# Patient Record
Sex: Male | Born: 1970
Health system: Southern US, Community
[De-identification: ages and names within clinical notes are randomized; demographics above are authoritative.]

## PROBLEM LIST (undated history)

## (undated) DIAGNOSIS — F32A Depression, unspecified: Secondary | ICD-10-CM

## (undated) DIAGNOSIS — I5032 Chronic diastolic (congestive) heart failure: Secondary | ICD-10-CM

## (undated) DIAGNOSIS — Q761 Klippel-Feil syndrome: Secondary | ICD-10-CM

## (undated) DIAGNOSIS — F419 Anxiety disorder, unspecified: Secondary | ICD-10-CM

## (undated) DIAGNOSIS — F329 Major depressive disorder, single episode, unspecified: Secondary | ICD-10-CM

## (undated) DIAGNOSIS — I509 Heart failure, unspecified: Secondary | ICD-10-CM

## (undated) DIAGNOSIS — E785 Hyperlipidemia, unspecified: Secondary | ICD-10-CM

## (undated) DIAGNOSIS — I10 Essential (primary) hypertension: Secondary | ICD-10-CM

## (undated) DIAGNOSIS — J45909 Unspecified asthma, uncomplicated: Secondary | ICD-10-CM

## (undated) DIAGNOSIS — M1990 Unspecified osteoarthritis, unspecified site: Secondary | ICD-10-CM

## (undated) DIAGNOSIS — J449 Chronic obstructive pulmonary disease, unspecified: Secondary | ICD-10-CM

## (undated) DIAGNOSIS — Z9289 Personal history of other medical treatment: Secondary | ICD-10-CM

## (undated) DIAGNOSIS — H409 Unspecified glaucoma: Secondary | ICD-10-CM

## (undated) DIAGNOSIS — K219 Gastro-esophageal reflux disease without esophagitis: Secondary | ICD-10-CM

## (undated) HISTORY — DX: Chronic diastolic (congestive) heart failure: I50.32

## (undated) HISTORY — PX: DENVER SHUNT PLACEMENT: SUR1031

## (undated) HISTORY — DX: Gastro-esophageal reflux disease without esophagitis: K21.9

## (undated) HISTORY — DX: Anxiety disorder, unspecified: F41.9

## (undated) HISTORY — DX: Chronic obstructive pulmonary disease, unspecified: J44.9

## (undated) HISTORY — DX: Klippel-Feil syndrome: Q76.1

## (undated) HISTORY — DX: Personal history of other medical treatment: Z92.89

## (undated) HISTORY — DX: Unspecified asthma, uncomplicated: J45.909

## (undated) HISTORY — PX: COLOSTOMY: SHX63

## (undated) HISTORY — DX: Hyperlipidemia, unspecified: E78.5

## (undated) HISTORY — DX: Essential (primary) hypertension: I10

## (undated) HISTORY — DX: Depression, unspecified: F32.A

## (undated) HISTORY — DX: Unspecified glaucoma: H40.9

## (undated) HISTORY — DX: Major depressive disorder, single episode, unspecified: F32.9

## (undated) HISTORY — DX: Unspecified osteoarthritis, unspecified site: M19.90

## (undated) HISTORY — DX: Heart failure, unspecified: I50.9

---

## 2004-07-07 ENCOUNTER — Inpatient Hospital Stay: Payer: Self-pay

## 2006-09-06 ENCOUNTER — Ambulatory Visit: Payer: Self-pay | Admitting: Internal Medicine

## 2007-06-13 DIAGNOSIS — I10 Essential (primary) hypertension: Secondary | ICD-10-CM | POA: Insufficient documentation

## 2008-03-13 ENCOUNTER — Ambulatory Visit: Payer: Self-pay | Admitting: Family Medicine

## 2008-06-11 DIAGNOSIS — K529 Noninfective gastroenteritis and colitis, unspecified: Secondary | ICD-10-CM | POA: Insufficient documentation

## 2008-06-11 HISTORY — DX: Noninfective gastroenteritis and colitis, unspecified: K52.9

## 2009-11-25 ENCOUNTER — Ambulatory Visit: Payer: Self-pay

## 2010-04-17 ENCOUNTER — Ambulatory Visit: Payer: Self-pay

## 2011-03-03 ENCOUNTER — Ambulatory Visit: Payer: Self-pay | Admitting: Family Medicine

## 2011-05-25 ENCOUNTER — Ambulatory Visit: Payer: Self-pay | Admitting: Family Medicine

## 2013-06-21 ENCOUNTER — Ambulatory Visit: Payer: Self-pay | Admitting: Surgery

## 2013-08-20 ENCOUNTER — Ambulatory Visit: Payer: Self-pay | Admitting: Family Medicine

## 2014-09-05 ENCOUNTER — Telehealth: Payer: Self-pay | Admitting: Family Medicine

## 2014-09-05 NOTE — Telephone Encounter (Signed)
Patient called and requested to be referred to [facility]. Cb# [phone]

## 2014-09-06 NOTE — Telephone Encounter (Signed)
Need reason for referral and may need appointment for documentation of issue.

## 2014-09-06 NOTE — Telephone Encounter (Signed)
Called and left message for patient to contact us.  He will need to schedule appt

## 2014-09-11 DIAGNOSIS — L255 Unspecified contact dermatitis due to plants, except food: Secondary | ICD-10-CM

## 2014-09-11 DIAGNOSIS — R6882 Decreased libido: Secondary | ICD-10-CM | POA: Insufficient documentation

## 2014-09-11 DIAGNOSIS — S76219A Strain of adductor muscle, fascia and tendon of unspecified thigh, initial encounter: Secondary | ICD-10-CM | POA: Insufficient documentation

## 2014-09-11 DIAGNOSIS — K439 Ventral hernia without obstruction or gangrene: Secondary | ICD-10-CM | POA: Insufficient documentation

## 2014-09-11 DIAGNOSIS — H6990 Unspecified Eustachian tube disorder, unspecified ear: Secondary | ICD-10-CM | POA: Insufficient documentation

## 2014-09-11 DIAGNOSIS — K5900 Constipation, unspecified: Secondary | ICD-10-CM | POA: Insufficient documentation

## 2014-09-11 DIAGNOSIS — F419 Anxiety disorder, unspecified: Secondary | ICD-10-CM | POA: Insufficient documentation

## 2014-09-11 DIAGNOSIS — L649 Androgenic alopecia, unspecified: Secondary | ICD-10-CM | POA: Insufficient documentation

## 2014-09-11 DIAGNOSIS — G43909 Migraine, unspecified, not intractable, without status migrainosus: Secondary | ICD-10-CM | POA: Insufficient documentation

## 2014-09-11 DIAGNOSIS — I10 Essential (primary) hypertension: Secondary | ICD-10-CM | POA: Insufficient documentation

## 2014-09-11 DIAGNOSIS — D489 Neoplasm of uncertain behavior, unspecified: Secondary | ICD-10-CM | POA: Insufficient documentation

## 2014-09-11 DIAGNOSIS — J45909 Unspecified asthma, uncomplicated: Secondary | ICD-10-CM | POA: Insufficient documentation

## 2014-09-11 DIAGNOSIS — R51 Headache: Secondary | ICD-10-CM

## 2014-09-11 DIAGNOSIS — R069 Unspecified abnormalities of breathing: Secondary | ICD-10-CM | POA: Insufficient documentation

## 2014-09-11 DIAGNOSIS — R519 Headache, unspecified: Secondary | ICD-10-CM | POA: Insufficient documentation

## 2014-09-11 DIAGNOSIS — H6980 Other specified disorders of Eustachian tube, unspecified ear: Secondary | ICD-10-CM | POA: Insufficient documentation

## 2014-09-11 DIAGNOSIS — K429 Umbilical hernia without obstruction or gangrene: Secondary | ICD-10-CM | POA: Insufficient documentation

## 2014-09-11 HISTORY — DX: Strain of adductor muscle, fascia and tendon of unspecified thigh, initial encounter: S76.219A

## 2014-09-11 HISTORY — DX: Unspecified eustachian tube disorder, unspecified ear: H69.90

## 2014-09-11 HISTORY — DX: Unspecified contact dermatitis due to plants, except food: L25.5

## 2014-09-13 ENCOUNTER — Other Ambulatory Visit: Payer: Self-pay

## 2014-09-13 ENCOUNTER — Encounter: Payer: Self-pay | Admitting: Family Medicine

## 2014-09-13 ENCOUNTER — Ambulatory Visit (INDEPENDENT_AMBULATORY_CARE_PROVIDER_SITE_OTHER): Payer: Medicare Other | Admitting: Family Medicine

## 2014-09-13 ENCOUNTER — Ambulatory Visit
Admission: RE | Admit: 2014-09-13 | Discharge: 2014-09-13 | Disposition: A | Discharge status: Home / Self Care | Payer: Medicare Other | Source: Ambulatory Visit | Attending: Family Medicine | Admitting: Family Medicine

## 2014-09-13 VITALS — BP 122/78 | HR 80 | Temp 99.0°F | Resp 20 | Ht 62.0 in | Wt 171.0 lb

## 2014-09-13 DIAGNOSIS — Q761 Klippel-Feil syndrome: Secondary | ICD-10-CM | POA: Diagnosis not present

## 2014-09-13 DIAGNOSIS — I10 Essential (primary) hypertension: Secondary | ICD-10-CM | POA: Insufficient documentation

## 2014-09-13 DIAGNOSIS — M25541 Pain in joints of right hand: Secondary | ICD-10-CM

## 2014-09-13 DIAGNOSIS — M79641 Pain in right hand: Secondary | ICD-10-CM

## 2014-09-13 DIAGNOSIS — Z87891 Personal history of nicotine dependence: Secondary | ICD-10-CM | POA: Insufficient documentation

## 2014-09-13 MED ORDER — ETODOLAC 200 MG PO CAPS: 200.0000 mg | ORAL_CAPSULE | Freq: Three times a day (TID) | ORAL | Status: DC

## 2014-09-13 NOTE — Progress Notes (Signed)
Subjective:     Patient ID: Scott [name], male   DOB: 13-Feb-1971, 44 y.o.   MRN: [id number]  Hand Pain  The incident occurred more than 1 week ago. There was no injury mechanism. The pain is present in the right hand and right fingers. The quality of the pain is described as cramping. The pain does not radiate. The pain is at a severity of 7/10. The pain is moderate. The pain has been intermittent since the incident. Associated symptoms include numbness and tingling. The symptoms are aggravated by movement and lifting (writing ). He has tried nothing for the symptoms.  Patient reports that he thinks he may have injured hand while opening a jar a few days ago. He can not think of anything else that could have caused pain.    Review of Systems  Constitutional: Negative.   Musculoskeletal: Positive for arthralgias and neck stiffness. Negative for joint swelling.  Neurological: Positive for tingling and numbness.   Patient Active Problem List   Diagnosis Date Noted  . Anxiety 09/11/2014  . Airway hyperreactivity 09/11/2014  . CN (constipation) 09/11/2014  . Alopecia, male pattern 09/11/2014  . Decreased libido 09/11/2014  . Abnormal respiratory rate 09/11/2014  . Dysfunction of eustachian tube 09/11/2014  . Groin strain 09/11/2014  . Cephalalgia 09/11/2014  . Umbilical hernia 01/30/2535  . Essential hypertension 09/11/2014  . Migraine 09/11/2014  . Neoplasm of uncertain behavior 64/40/3474  . Hernia of anterior abdominal wall 09/11/2014  . Contact dermatitis due to Genus Toxicodendron 09/11/2014  . Open-angle glaucoma 05/20/2009  . Congenital hydrocephalus 05/20/2009  . 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 Enteritis presumed infectious 06/11/2008  . HLD (hyperlipidemia) 03/15/2008  . Clinical depression 06/13/2007  . Essential (primary) hypertension 06/13/2007   Past Surgical History  Procedure Laterality Date  . Colostomy    . [place] shunt placement     Family History  Problem  Relation Age of Onset  . Multiple sclerosis Mother   . Colon cancer Father   . Heart disease Father   . Lung cancer Maternal Grandmother   . Thyroid cancer Maternal Grandfather   . Ovarian cancer Paternal Grandmother   . Cancer Paternal Grandfather    History  Substance Use Topics  . Smoking status: Former Smoker -- 10 years  . Smokeless tobacco: Not on file     Comment: quit 05/2006  . Alcohol Use: No   Current Outpatient Prescriptions on File Prior to Visit  Medication Sig Dispense Refill  . brinzolamide (AZOPT) 1 % ophthalmic suspension Apply 1 drop to eye daily.    . diazepam (VALIUM) 2 MG tablet Take 1 tablet by mouth as needed.    . latanoprost (XALATAN) 0.005 % ophthalmic solution 1 drop at bedtime.    Marland Kitchen LORazepam (ATIVAN) 1 MG tablet Take 1 tablet by mouth 2 (two) times daily.    Marland Kitchen PARoxetine (PAXIL) 40 MG tablet Take 1 tablet by mouth daily.    . quinapril (ACCUPRIL) 20 MG tablet Take 1 tablet by mouth daily.     No current facility-administered medications on file prior to visit.   Allergies  Allergen Reactions  . Codeine         Objective:   Physical Exam  Constitutional: He is oriented to person, place, and time. He appears well-developed and well-nourished. No distress.  HENT:  Head: Atraumatic.  Right Ear: Hearing normal.  Left Ear: Hearing normal.  Nose: Nose normal.  Eyes: Conjunctivae and lids are normal. Right eye exhibits no  discharge. Left eye exhibits no discharge. No scleral icterus.  Pulmonary/Chest: Effort normal. No respiratory distress.  Musculoskeletal:  Left first metacarpal bone missing congenitally. Tender right index finger PIP joint and MCP joint. Also, tender right 5th finger PIP joint. Scars at base of right thumb and palm at the base of the 5th finger from surgery to regain mobility and strength in the right hand. Limited ability to grip and completely oppose right thumb. Left hand stiff and unable to grip or flex/extend wrist due to  congenital anomalies.  Neurological: He is alert and oriented to person, place, and time.  Skin: Skin is intact. No lesion and no rash noted.  Psychiatric: He has a normal mood and affect. His speech is normal and behavior is normal. Thought content normal.   BP 122/78 mmHg  Pulse 80  Temp(Src) 99 F (37.2 C)  Resp 20  Ht 5\' 2"  (1.575 m)  Wt 171 lb (77.565 kg)  BMI 31.27 kg/m2     Assessment:    Joint pain in fingers of right hand - Recent flare without specific injury. Suspect degenerative arthritis due to congenital deformities and limited functionality.        Plan:    Will get complete x-ray of right hand and treat with Etodolac for pain and inflammation. May use hot soaks or heating pad. May need referral to hand surgeon/orthopedist pending reports of x-rays.

## 2014-09-16 ENCOUNTER — Telehealth: Payer: Self-pay

## 2014-09-16 NOTE — Telephone Encounter (Signed)
-----   Message from Margo Common, [place] sent at 09/13/2014  4:54 PM EDT ----- No fracture of bones. Only mild arthritis changes. If no relief from medication and heat applications in 5-6 days, will schedule orthopedic referral.

## 2014-09-16 NOTE — Telephone Encounter (Signed)
LMTCB

## 2014-09-16 NOTE — Telephone Encounter (Signed)
Patient advised as directed below. Patient verbalized understanding.  

## 2014-11-01 ENCOUNTER — Telehealth: Payer: Self-pay | Admitting: Family Medicine

## 2014-11-01 DIAGNOSIS — M79641 Pain in right hand: Secondary | ICD-10-CM

## 2014-11-01 NOTE — Telephone Encounter (Signed)
Pt stated that his hand isn't doing better and the pain has started going up his forearm into his elbow. PT would like to get an orthopedic referral. Thanks TNP

## 2014-11-05 NOTE — Telephone Encounter (Signed)
Schedule orthopedic hand referral for persistent right hand pain over the past 6-8 weeks. History of Klippel-Feil syndrome with congenital hydrocephalus and multiple skeletal deformities. X-ray on 09-13-14 showed absent scaphoid and lunate bones on the right.

## 2014-11-07 ENCOUNTER — Ambulatory Visit
Admission: RE | Admit: 2014-11-07 | Discharge: 2014-11-07 | Disposition: A | Discharge status: Home / Self Care | Payer: Medicare Other | Source: Ambulatory Visit | Attending: Family Medicine | Admitting: Family Medicine

## 2014-11-07 ENCOUNTER — Encounter: Payer: Self-pay | Admitting: Family Medicine

## 2014-11-07 ENCOUNTER — Ambulatory Visit (INDEPENDENT_AMBULATORY_CARE_PROVIDER_SITE_OTHER): Payer: Medicare Other | Admitting: Family Medicine

## 2014-11-07 DIAGNOSIS — Q039 Congenital hydrocephalus, unspecified: Secondary | ICD-10-CM | POA: Diagnosis not present

## 2014-11-07 DIAGNOSIS — T07XXXA Unspecified multiple injuries, initial encounter: Secondary | ICD-10-CM

## 2014-11-07 DIAGNOSIS — Q761 Klippel-Feil syndrome: Secondary | ICD-10-CM

## 2014-11-07 DIAGNOSIS — M5136 Other intervertebral disc degeneration, lumbar region: Secondary | ICD-10-CM | POA: Diagnosis not present

## 2014-11-07 DIAGNOSIS — G44209 Tension-type headache, unspecified, not intractable: Secondary | ICD-10-CM

## 2014-11-07 DIAGNOSIS — R0781 Pleurodynia: Secondary | ICD-10-CM | POA: Diagnosis not present

## 2014-11-07 DIAGNOSIS — M545 Low back pain: Secondary | ICD-10-CM | POA: Diagnosis present

## 2014-11-07 DIAGNOSIS — M419 Scoliosis, unspecified: Secondary | ICD-10-CM | POA: Insufficient documentation

## 2014-11-07 DIAGNOSIS — T148 Other injury of unspecified body region: Secondary | ICD-10-CM
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
## 2014-11-11 ENCOUNTER — Telehealth: Payer: Self-pay

## 2014-11-11 NOTE — Telephone Encounter (Signed)
-----   Message from Margo Common, [place] sent at 11/08/2014  3:26 PM EDT ----- No sign of rib fractures. All x-rays essentially unchanged without acute bony injury.

## 2014-11-11 NOTE — Telephone Encounter (Signed)
Patient advised as directed below.  Thanks,  -Karl Knarr 

## 2014-11-11 NOTE — Telephone Encounter (Signed)
LMTCB

## 2014-11-11 NOTE — Telephone Encounter (Signed)
LMTCB  11/11/14  Thanks,  -Joseline

## 2014-11-11 NOTE — Telephone Encounter (Signed)
Pt returned call. Thanks TNP °

## 2014-11-11 NOTE — Telephone Encounter (Signed)
-----   Message from Margo Common, [place] sent at 11/08/2014  3:23 PM EDT ----- X-ray reports some degenerative facet changes in the lower lumbar region with rods still stable and in place. No sign of acute injury or fractures.

## 2014-11-11 NOTE — Telephone Encounter (Signed)
-----   Message from [place], [place] sent at 11/08/2014  3:25 PM EDT ----- Stable scoliosis on chest x-ray without infection. Stable cardiac silhouette without heart enlargement. No pleural effusion.

## 2014-11-15 ENCOUNTER — Ambulatory Visit
Admission: RE | Admit: 2014-11-15 | Discharge: 2014-11-15 | Disposition: A | Discharge status: Home / Self Care | Payer: Medicare Other | Source: Ambulatory Visit | Attending: Family Medicine | Admitting: Family Medicine

## 2014-11-15 DIAGNOSIS — Q039 Congenital hydrocephalus, unspecified: Secondary | ICD-10-CM | POA: Insufficient documentation

## 2014-11-15 DIAGNOSIS — G44209 Tension-type headache, unspecified, not intractable: Secondary | ICD-10-CM | POA: Insufficient documentation

## 2014-11-19 ENCOUNTER — Telehealth: Payer: Self-pay

## 2014-11-19 ENCOUNTER — Telehealth: Payer: Self-pay | Admitting: Family Medicine

## 2014-11-19 NOTE — Telephone Encounter (Signed)
LMTCB -11/19/14  Thanks,   -Nicci Vaughan

## 2014-11-19 NOTE — Telephone Encounter (Signed)
Advised patient of CT results.  

## 2014-11-19 NOTE — Telephone Encounter (Signed)
Pt is returning call.  CB#[phone]/MW
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
## 2014-11-25 ENCOUNTER — Other Ambulatory Visit: Payer: Self-pay | Admitting: Orthopedic Surgery

## 2014-11-25 DIAGNOSIS — M25511 Pain in right shoulder: Secondary | ICD-10-CM

## 2014-11-29 ENCOUNTER — Ambulatory Visit
Admission: RE | Admit: 2014-11-29 | Discharge: 2014-11-29 | Disposition: A | Discharge status: Home / Self Care | Payer: Medicare Other | Source: Ambulatory Visit | Attending: Orthopedic Surgery | Admitting: Orthopedic Surgery

## 2014-11-29 DIAGNOSIS — M25511 Pain in right shoulder: Secondary | ICD-10-CM

## 2014-11-29 DIAGNOSIS — M7551 Bursitis of right shoulder: Secondary | ICD-10-CM | POA: Diagnosis not present

## 2014-11-29 DIAGNOSIS — S46111A Strain of muscle, fascia and tendon of long head of biceps, right arm, initial encounter: Secondary | ICD-10-CM | POA: Diagnosis not present

## 2014-11-29 DIAGNOSIS — X58XXXA Exposure to other specified factors, initial encounter: Secondary | ICD-10-CM | POA: Diagnosis not present

## 2014-11-29 MED ORDER — IOHEXOL 300 MG/ML  SOLN: 10.0000 mL | Freq: Once | INTRAMUSCULAR | Status: DC | PRN

## 2014-12-07 ENCOUNTER — Other Ambulatory Visit: Payer: Self-pay | Admitting: Family Medicine

## 2015-01-10 ENCOUNTER — Encounter: Payer: Self-pay | Admitting: Family Medicine

## 2015-06-16 DIAGNOSIS — Z79899 Other long term (current) drug therapy: Secondary | ICD-10-CM | POA: Diagnosis not present

## 2015-06-16 DIAGNOSIS — F330 Major depressive disorder, recurrent, mild: Secondary | ICD-10-CM | POA: Diagnosis not present

## 2015-06-19 DIAGNOSIS — D352 Benign neoplasm of pituitary gland: Secondary | ICD-10-CM | POA: Diagnosis not present

## 2015-06-19 DIAGNOSIS — E221 Hyperprolactinemia: Secondary | ICD-10-CM | POA: Diagnosis not present

## 2015-06-19 DIAGNOSIS — E291 Testicular hypofunction: Secondary | ICD-10-CM | POA: Diagnosis not present

## 2015-06-25 DIAGNOSIS — Q761 Klippel-Feil syndrome: Secondary | ICD-10-CM | POA: Diagnosis not present

## 2015-06-25 DIAGNOSIS — Q540 Hypospadias, balanic: Secondary | ICD-10-CM | POA: Diagnosis not present

## 2015-06-25 DIAGNOSIS — E785 Hyperlipidemia, unspecified: Secondary | ICD-10-CM | POA: Diagnosis not present

## 2015-06-25 DIAGNOSIS — D352 Benign neoplasm of pituitary gland: Secondary | ICD-10-CM | POA: Diagnosis not present

## 2015-06-25 DIAGNOSIS — E291 Testicular hypofunction: Secondary | ICD-10-CM | POA: Diagnosis not present

## 2015-06-25 DIAGNOSIS — F5101 Primary insomnia: Secondary | ICD-10-CM | POA: Diagnosis not present

## 2015-06-25 DIAGNOSIS — F4322 Adjustment disorder with anxiety: Secondary | ICD-10-CM | POA: Diagnosis not present

## 2015-06-25 DIAGNOSIS — I10 Essential (primary) hypertension: Secondary | ICD-10-CM | POA: Diagnosis not present

## 2015-06-25 DIAGNOSIS — E221 Hyperprolactinemia: Secondary | ICD-10-CM | POA: Diagnosis not present

## 2015-09-18 DIAGNOSIS — E291 Testicular hypofunction: Secondary | ICD-10-CM | POA: Diagnosis not present

## 2015-09-18 DIAGNOSIS — D352 Benign neoplasm of pituitary gland: Secondary | ICD-10-CM | POA: Diagnosis not present

## 2015-09-18 DIAGNOSIS — F4322 Adjustment disorder with anxiety: Secondary | ICD-10-CM | POA: Diagnosis not present

## 2015-09-18 DIAGNOSIS — E785 Hyperlipidemia, unspecified: Secondary | ICD-10-CM | POA: Diagnosis not present

## 2015-09-18 DIAGNOSIS — Q540 Hypospadias, balanic: Secondary | ICD-10-CM | POA: Diagnosis not present

## 2015-09-18 DIAGNOSIS — E221 Hyperprolactinemia: Secondary | ICD-10-CM | POA: Diagnosis not present

## 2015-09-18 DIAGNOSIS — Q761 Klippel-Feil syndrome: Secondary | ICD-10-CM | POA: Diagnosis not present

## 2015-09-25 DIAGNOSIS — E221 Hyperprolactinemia: Secondary | ICD-10-CM | POA: Diagnosis not present

## 2015-09-25 DIAGNOSIS — F4322 Adjustment disorder with anxiety: Secondary | ICD-10-CM | POA: Diagnosis not present

## 2015-09-25 DIAGNOSIS — E785 Hyperlipidemia, unspecified: Secondary | ICD-10-CM | POA: Diagnosis not present

## 2015-09-25 DIAGNOSIS — Q540 Hypospadias, balanic: Secondary | ICD-10-CM | POA: Diagnosis not present

## 2015-09-25 DIAGNOSIS — I10 Essential (primary) hypertension: Secondary | ICD-10-CM | POA: Diagnosis not present

## 2015-09-25 DIAGNOSIS — D352 Benign neoplasm of pituitary gland: Secondary | ICD-10-CM | POA: Diagnosis not present

## 2015-09-25 DIAGNOSIS — Q761 Klippel-Feil syndrome: Secondary | ICD-10-CM | POA: Diagnosis not present

## 2015-09-25 DIAGNOSIS — E291 Testicular hypofunction: Secondary | ICD-10-CM | POA: Diagnosis not present

## 2015-09-25 DIAGNOSIS — F5101 Primary insomnia: Secondary | ICD-10-CM | POA: Diagnosis not present

## 2015-10-15 DIAGNOSIS — Z86011 Personal history of benign neoplasm of the brain: Secondary | ICD-10-CM | POA: Diagnosis not present

## 2015-10-15 DIAGNOSIS — D352 Benign neoplasm of pituitary gland: Secondary | ICD-10-CM | POA: Diagnosis not present

## 2015-10-15 DIAGNOSIS — Z09 Encounter for follow-up examination after completed treatment for conditions other than malignant neoplasm: Secondary | ICD-10-CM | POA: Diagnosis not present

## 2015-10-20 DIAGNOSIS — D352 Benign neoplasm of pituitary gland: Secondary | ICD-10-CM | POA: Diagnosis not present

## 2015-10-20 DIAGNOSIS — F4322 Adjustment disorder with anxiety: Secondary | ICD-10-CM | POA: Diagnosis not present

## 2015-10-20 DIAGNOSIS — Q761 Klippel-Feil syndrome: Secondary | ICD-10-CM | POA: Diagnosis not present

## 2015-10-20 DIAGNOSIS — I10 Essential (primary) hypertension: Secondary | ICD-10-CM | POA: Diagnosis not present

## 2015-10-20 DIAGNOSIS — E291 Testicular hypofunction: Secondary | ICD-10-CM | POA: Diagnosis not present

## 2015-10-20 DIAGNOSIS — F5101 Primary insomnia: Secondary | ICD-10-CM | POA: Diagnosis not present

## 2015-10-20 DIAGNOSIS — E221 Hyperprolactinemia: Secondary | ICD-10-CM | POA: Diagnosis not present

## 2015-10-20 DIAGNOSIS — Q540 Hypospadias, balanic: Secondary | ICD-10-CM | POA: Diagnosis not present

## 2015-10-20 DIAGNOSIS — E785 Hyperlipidemia, unspecified: Secondary | ICD-10-CM | POA: Diagnosis not present

## 2015-12-05 ENCOUNTER — Other Ambulatory Visit: Payer: Self-pay | Admitting: Family Medicine

## 2015-12-10 ENCOUNTER — Ambulatory Visit: Payer: Medicare Other | Admitting: Family Medicine

## 2015-12-11 ENCOUNTER — Ambulatory Visit (INDEPENDENT_AMBULATORY_CARE_PROVIDER_SITE_OTHER): Payer: Medicare Other | Admitting: Family Medicine

## 2015-12-11 ENCOUNTER — Encounter: Payer: Self-pay | Admitting: Family Medicine

## 2015-12-11 VITALS — BP 142/82 | HR 81 | Temp 98.2°F | Resp 14 | Wt 184.6 lb

## 2015-12-11 DIAGNOSIS — R1032 Left lower quadrant pain: Secondary | ICD-10-CM | POA: Diagnosis not present

## 2015-12-11 DIAGNOSIS — N451 Epididymitis: Secondary | ICD-10-CM | POA: Diagnosis not present

## 2015-12-11 DIAGNOSIS — N475 Adhesions of prepuce and glans penis: Secondary | ICD-10-CM

## 2015-12-11 DIAGNOSIS — N478 Other disorders of prepuce: Secondary | ICD-10-CM

## 2015-12-11 LAB — POCT URINALYSIS DIPSTICK
Bilirubin, UA: NEGATIVE
GLUCOSE UA: NEGATIVE
Ketones, UA: NEGATIVE
Leukocytes, UA: NEGATIVE
NITRITE UA: NEGATIVE
PH UA: 6
PROTEIN UA: NEGATIVE
RBC UA: NEGATIVE
Spec Grav, UA: 1.02
UROBILINOGEN UA: 0.2

## 2015-12-11 MED ORDER — DOXYCYCLINE HYCLATE 100 MG PO TABS: 100.0000 mg | ORAL_TABLET | Freq: Two times a day (BID) | ORAL | 0 refills | Status: DC

## 2015-12-11 NOTE — Progress Notes (Signed)
Patient: Scott [name] Male    DOB: [birth year]   45 y.o.   MRN: [id number] Visit Date: 12/11/2015  Today's Provider: [name], PA   Chief Complaint  Patient presents with  . Abdominal Pain   Subjective:    Abdominal Pain  This is a new problem. The current episode started more than 1 month ago. The problem occurs intermittently. The problem has been gradually worsening. The pain is located in the LLQ. The quality of the pain is aching. Pain radiation: back and leg. Exacerbated by: sitting. Relieved by: masterbating. abdominal hernia   Patient Active Problem List   Diagnosis Date Noted  . Anxiety 09/11/2014  . Airway hyperreactivity 09/11/2014  . CN (constipation) 09/11/2014  . Alopecia, male pattern 09/11/2014  . Decreased libido 09/11/2014  . Abnormal respiratory rate 09/11/2014  . Dysfunction of eustachian tube 09/11/2014  . Groin strain 09/11/2014  . Cephalalgia 09/11/2014  . Umbilical hernia [id number]  . Essential hypertension 09/11/2014  . Migraine 09/11/2014  . Neoplasm of uncertain behavior [id number]  . Hernia of anterior abdominal wall 09/11/2014  . Contact dermatitis due to Genus Toxicodendron 09/11/2014  . Open-angle glaucoma 05/20/2009  . Congenital hydrocephalus ([place]) 05/20/2009  . 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 Enteritis presumed infectious 06/11/2008  . HLD (hyperlipidemia) 03/15/2008  . Clinical depression 06/13/2007  . Essential (primary) hypertension 06/13/2007   Past Surgical History:  Procedure Laterality Date  . COLOSTOMY    . [place] SHUNT PLACEMENT     Family History  Problem Relation Age of Onset  . Multiple sclerosis Mother   . Colon cancer Father   . Heart disease Father   . Lung cancer Maternal Grandmother   . Thyroid cancer Maternal Grandfather   . Ovarian cancer Paternal Grandmother   . Cancer Paternal Grandfather    Allergies  Allergen Reactions  . Codeine      Previous Medications   ANDROGEL PUMP 20.25 MG/ACT (1.62%)  GEL       BRINZOLAMIDE (AZOPT) 1 % OPHTHALMIC SUSPENSION    Apply 1 drop to eye daily.   BROMOCRIPTINE (PARLODEL) 2.5 MG TABLET    TK 3 TS PO QAM   DIAZEPAM (VALIUM) 2 MG TABLET    Take 1 tablet by mouth as needed.   LATANOPROST (XALATAN) 0.005 % OPHTHALMIC SOLUTION    1 drop at bedtime.   LORAZEPAM (ATIVAN) 1 MG TABLET    Take 1 tablet by mouth 2 (two) times daily.   PAROXETINE (PAXIL) 40 MG TABLET    Take 1 tablet by mouth daily.   QUINAPRIL (ACCUPRIL) 20 MG TABLET    TAKE 1 TABLET BY MOUTH EVERY DAY    Review of Systems  Constitutional: Negative.   Respiratory: Negative.   Cardiovascular: Negative.   Gastrointestinal: Positive for abdominal pain.    Social History  Substance Use Topics  . Smoking status: Former Smoker    Years: 10.00  . Smokeless tobacco: Never Used     Comment: quit 05/2006  . Alcohol use No   Objective:   BP (!) 142/82 (BP Location: Right Arm, Patient Position: Sitting, Cuff Size: Normal)   Pulse 81   Temp 98.2 F (36.8 C) (Oral)   Resp 14   Wt 184 lb 9.6 oz (83.7 kg)   SpO2 95%   BMI 33.76 kg/m  Wt Readings from Last 3 Encounters:  12/11/15 184 lb 9.6 oz (83.7 kg)  11/29/14 171 lb (77.6 kg)  11/07/14 171 lb 3.2 oz (77.7 kg)  Physical Exam  Constitutional: He is oriented to person, place, and time. He appears well-developed and well-nourished.  HENT:  Deformity of skull and left side of jaw - mild.   Eyes: Conjunctivae are normal.  Cardiovascular: Normal rate.   Pulmonary/Chest: Breath sounds normal.  Abdominal: Soft. Bowel sounds are normal. He exhibits no mass. There is no tenderness. There is no guarding.  Genitourinary:  Genitourinary Comments: Some tenderness of left scrotum/posterior testicle of uncircumcised male. No sign of hernia. No inguinal tenderness.  Neurological: He is alert and oriented to person, place, and time.      Assessment & Plan:     1. Left lower quadrant pain Intermittent discomfort for the past few months. No  hematuria, hematemesis, diarrhea, constipation or hematochezia. Seems to be worse with sitting long periods of time and radiate to the left testicle. Relieved by masturbating. Urinalysis normal except concentrated urine and need for increase in water intake. Suspect epididymitis. - POCT Urinalysis Dipstick  2. Epididymitis, left Slight tenderness to palpate. Will treat with Doxycycline and may soak in a hot bath. Recheck if no better in 10-14 days. - doxycycline (VIBRA-TABS) 100 MG tablet; Take 1 tablet (100 mg total) by mouth 2 (two) times daily.  Dispense: 20 tablet; Refill: 0  3. Foreskin adhesions States he has some difficulty attaining erections with discomfort and unable to accomplish vaginal intercourse. Recommend urology referral. - Ambulatory referral to Urology

## 2015-12-24 DIAGNOSIS — H40153 Residual stage of open-angle glaucoma, bilateral: Secondary | ICD-10-CM | POA: Diagnosis not present

## 2015-12-29 DIAGNOSIS — D352 Benign neoplasm of pituitary gland: Secondary | ICD-10-CM | POA: Diagnosis not present

## 2015-12-29 DIAGNOSIS — Q761 Klippel-Feil syndrome: Secondary | ICD-10-CM | POA: Diagnosis not present

## 2015-12-29 DIAGNOSIS — E291 Testicular hypofunction: Secondary | ICD-10-CM | POA: Diagnosis not present

## 2015-12-29 DIAGNOSIS — E221 Hyperprolactinemia: Secondary | ICD-10-CM | POA: Diagnosis not present

## 2015-12-29 DIAGNOSIS — Q02 Microcephaly: Secondary | ICD-10-CM | POA: Diagnosis not present

## 2016-01-05 ENCOUNTER — Ambulatory Visit: Payer: Medicare Other

## 2016-01-06 DIAGNOSIS — D352 Benign neoplasm of pituitary gland: Secondary | ICD-10-CM | POA: Diagnosis not present

## 2016-01-06 DIAGNOSIS — I10 Essential (primary) hypertension: Secondary | ICD-10-CM | POA: Diagnosis not present

## 2016-01-06 DIAGNOSIS — E221 Hyperprolactinemia: Secondary | ICD-10-CM | POA: Diagnosis not present

## 2016-01-06 DIAGNOSIS — E785 Hyperlipidemia, unspecified: Secondary | ICD-10-CM | POA: Diagnosis not present

## 2016-01-06 DIAGNOSIS — F5101 Primary insomnia: Secondary | ICD-10-CM | POA: Diagnosis not present

## 2016-01-06 DIAGNOSIS — Q761 Klippel-Feil syndrome: Secondary | ICD-10-CM | POA: Diagnosis not present

## 2016-01-06 DIAGNOSIS — E291 Testicular hypofunction: Secondary | ICD-10-CM | POA: Diagnosis not present

## 2016-01-06 DIAGNOSIS — F4322 Adjustment disorder with anxiety: Secondary | ICD-10-CM | POA: Diagnosis not present

## 2016-01-06 DIAGNOSIS — Q540 Hypospadias, balanic: Secondary | ICD-10-CM | POA: Diagnosis not present

## 2016-01-12 ENCOUNTER — Encounter: Payer: Self-pay | Admitting: Urology

## 2016-01-12 ENCOUNTER — Ambulatory Visit (INDEPENDENT_AMBULATORY_CARE_PROVIDER_SITE_OTHER): Payer: Medicare Other | Admitting: Urology

## 2016-01-12 VITALS — Ht 62.0 in | Wt 182.3 lb

## 2016-01-12 DIAGNOSIS — Q542 Hypospadias, penoscrotal: Secondary | ICD-10-CM

## 2016-01-12 NOTE — Progress Notes (Signed)
01/12/2016 3:30 PM   Scott [name] [birth year] [id number]  Referring provider: Margo Common, [address] [place], [place] [address]  Chief Complaint  Patient presents with  . New Patient (Initial Visit)    foreskin adhesion     HPI: Pt referred for hypospadias and chordee. This is causing some difficulty with sex. He had a "colostomy" when he was a baby which was reversed at that age. It sounds like he had imperforate anus and also possibly hypospadias (he saw it listed on some of his old records). He gets a good erections. He does have a straight erection but gets a curve "down". He recalls back surgery at age 34 and was told his penis was "tethered". He's mainly trying to figure out what surgeries he had and he wonders if he can get the downward curve pointed up. He has a GF that is 24 and she is interested in kids, but he is having trouble with penetration. He has a normal ejaculate. He has some pain with ejaculation for many years (pain in LQ radiated to back / legs) - since he was a teenager.   He voids with a weak stream.   A screening U/A was negative.   He has a h/o scoliosis, back surgery.     PMH: Past Medical History:  Diagnosis Date  . Acid reflux   . Anxiety   . Arthritis   . Asthma   . Depression   . Glaucoma   . Hypertension     Surgical History: Past Surgical History:  Procedure Laterality Date  . COLOSTOMY    . DENVER SHUNT PLACEMENT      Home Medications:    Medication List       Accurate as of 01/12/16  3:30 PM. Always use your most recent med list.          ANDROGEL PUMP 20.25 MG/ACT (1.62%) Gel Generic drug:  Testosterone   AZOPT 1 % ophthalmic suspension Generic drug:  brinzolamide Apply 1 drop to eye daily.   bromocriptine 2.5 MG tablet Commonly known as:  PARLODEL TK 3 TS PO QAM   doxycycline 100 MG tablet Commonly known as:  VIBRA-TABS Take 1 tablet (100 mg total) by mouth 2 (two) times daily.   PAXIL 40 MG  tablet Generic drug:  PARoxetine Take 1 tablet by mouth daily.   quinapril 20 MG tablet Commonly known as:  ACCUPRIL TAKE 1 TABLET BY MOUTH EVERY DAY   VALIUM 2 MG tablet Generic drug:  diazepam Take 1 tablet by mouth as needed.   XALATAN 0.005 % ophthalmic solution Generic drug:  latanoprost 1 drop at bedtime.       Allergies:  Allergies  Allergen Reactions  . Codeine     Family History: Family History  Problem Relation Age of Onset  . Multiple sclerosis Mother   . Colon cancer Father   . Heart disease Father   . Lung cancer Maternal Grandmother   . Thyroid cancer Maternal Grandfather   . Ovarian cancer Paternal Grandmother   . Cancer Paternal Grandfather     Social History:  reports that he has quit smoking. He quit after 10.00 years of use. He has never used smokeless tobacco. He reports that he does not drink alcohol or use drugs.  ROS: UROLOGY Frequent Urination?: No Hard to postpone urination?: Yes Burning/pain with urination?: No Get up at night to urinate?: No Leakage of urine?: Yes Urine stream starts and stops?: No Trouble starting  stream?: No Do you have to strain to urinate?: No Blood in urine?: No Urinary tract infection?: No Sexually transmitted disease?: No Injury to kidneys or bladder?: No Painful intercourse?: No Weak stream?: Yes Erection problems?: Yes Penile pain?: Yes  Gastrointestinal Nausea?: Yes Vomiting?: No Indigestion/heartburn?: Yes Diarrhea?: Yes Constipation?: Yes  Constitutional Fever: No Night sweats?: Yes Weight loss?: No Fatigue?: Yes  Skin Skin rash/lesions?: Yes Itching?: Yes  Eyes Blurred vision?: No Double vision?: No  Ears/Nose/Throat Sore throat?: No Sinus problems?: Yes  Hematologic/Lymphatic Swollen glands?: No Easy bruising?: No  Cardiovascular Leg swelling?: No Chest pain?: No  Respiratory Cough?: Yes Shortness of breath?: Yes  Endocrine Excessive thirst?:  Yes  Musculoskeletal Back pain?: Yes Joint pain?: Yes  Neurological Headaches?: Yes Dizziness?: Yes  Psychologic Depression?: Yes Anxiety?: Yes  Physical Exam: Ht 5\' 2"  (1.575 m)   Wt 182 lb 4.8 oz (82.7 kg)   BMI 33.34 kg/m   Constitutional:  Alert and oriented, No acute distress. HEENT: [place] AT, moist mucus membranes.  Trachea midline, no masses. Cardiovascular: No clubbing, cyanosis, or edema. Respiratory: Normal respiratory effort, no increased work of breathing. GI: Abdomen is soft, nontender, nondistended, no abdominal masses Skin: No rashes, bruises or suspicious lesions. Lymph: No cervical or inguinal adenopathy. Neurologic: Grossly intact, no focal deficits, moving all 4 extremities. Psychiatric: Normal mood and affect. GU: penis somewhat buried and has a peno-scrotal hypospadias and chordee. I'm not certain if he's had any reconstruction or if a prior reconstruction split open. Left testicle is palpably normal, but smaller / atrophic. Right testicle palpably normal.   Laboratory Data: No results found for: WBC, HGB, HCT, MCV, PLT  No results found for: CREATININE  No results found for: PSA  No results found for: TESTOSTERONE  No results found for: HGBA1C  Urinalysis    Component Value Date/Time   BILIRUBINUR negative 12/11/2015 1125   PROTEINUR negative 12/11/2015 1125   UROBILINOGEN 0.2 12/11/2015 1125   NITRITE negative 12/11/2015 1125   LEUKOCYTESUR Negative 12/11/2015 1125    Pertinent Imaging:  Assessment & Plan:   1. Hypospadias/chordee - we discussed this can be tricky surgery with a lot of complications (ED, decreased sensation, fistula, sx, breakdown among others) but he's interested in exploring options. I'll have him see Dr. Francesca Jewett at [facility] for consult and if reconstruction is not a good option, he could see Dr. Michaelle Copas to consider fertility options. I drew him a picture of the anatomy.   There are no diagnoses linked to this encounter.  No  Follow-up on file.  Festus Aloe, [facility] [address] [place], [place] [address] [phone]

## 2016-02-06 DIAGNOSIS — E291 Testicular hypofunction: Secondary | ICD-10-CM | POA: Diagnosis not present

## 2016-02-06 DIAGNOSIS — Z885 Allergy status to narcotic agent status: Secondary | ICD-10-CM | POA: Diagnosis not present

## 2016-02-06 DIAGNOSIS — Q5310 Unspecified undescended testicle, unilateral: Secondary | ICD-10-CM | POA: Diagnosis not present

## 2016-02-06 DIAGNOSIS — Z79899 Other long term (current) drug therapy: Secondary | ICD-10-CM | POA: Diagnosis not present

## 2016-02-06 DIAGNOSIS — D352 Benign neoplasm of pituitary gland: Secondary | ICD-10-CM | POA: Diagnosis not present

## 2016-02-06 DIAGNOSIS — Q761 Klippel-Feil syndrome: Secondary | ICD-10-CM | POA: Diagnosis not present

## 2016-02-06 DIAGNOSIS — Q549 Hypospadias, unspecified: Secondary | ICD-10-CM | POA: Diagnosis not present

## 2016-02-06 DIAGNOSIS — Z87891 Personal history of nicotine dependence: Secondary | ICD-10-CM | POA: Diagnosis not present

## 2016-04-14 DIAGNOSIS — Q761 Klippel-Feil syndrome: Secondary | ICD-10-CM | POA: Diagnosis not present

## 2016-04-14 DIAGNOSIS — T387X5S Adverse effect of androgens and anabolic congeners, sequela: Secondary | ICD-10-CM | POA: Diagnosis not present

## 2016-04-14 DIAGNOSIS — Z79899 Other long term (current) drug therapy: Secondary | ICD-10-CM | POA: Diagnosis not present

## 2016-04-14 DIAGNOSIS — Q5310 Unspecified undescended testicle, unilateral: Secondary | ICD-10-CM | POA: Diagnosis not present

## 2016-04-14 DIAGNOSIS — E291 Testicular hypofunction: Secondary | ICD-10-CM | POA: Diagnosis not present

## 2016-04-14 DIAGNOSIS — D352 Benign neoplasm of pituitary gland: Secondary | ICD-10-CM | POA: Diagnosis not present

## 2016-04-14 DIAGNOSIS — Z125 Encounter for screening for malignant neoplasm of prostate: Secondary | ICD-10-CM | POA: Diagnosis not present

## 2016-04-14 DIAGNOSIS — Q549 Hypospadias, unspecified: Secondary | ICD-10-CM | POA: Diagnosis not present

## 2016-04-14 DIAGNOSIS — Z1271 Encounter for screening for malignant neoplasm of testis: Secondary | ICD-10-CM | POA: Diagnosis not present

## 2016-04-14 DIAGNOSIS — Z87891 Personal history of nicotine dependence: Secondary | ICD-10-CM | POA: Diagnosis not present

## 2016-04-26 DIAGNOSIS — H40153 Residual stage of open-angle glaucoma, bilateral: Secondary | ICD-10-CM | POA: Diagnosis not present

## 2016-05-18 DIAGNOSIS — Q549 Hypospadias, unspecified: Secondary | ICD-10-CM | POA: Diagnosis not present

## 2016-05-18 DIAGNOSIS — R339 Retention of urine, unspecified: Secondary | ICD-10-CM | POA: Diagnosis not present

## 2016-05-18 DIAGNOSIS — N5312 Painful ejaculation: Secondary | ICD-10-CM | POA: Diagnosis not present

## 2016-05-18 DIAGNOSIS — Z125 Encounter for screening for malignant neoplasm of prostate: Secondary | ICD-10-CM | POA: Diagnosis not present

## 2016-05-18 DIAGNOSIS — Z79899 Other long term (current) drug therapy: Secondary | ICD-10-CM | POA: Diagnosis not present

## 2016-05-18 DIAGNOSIS — Z885 Allergy status to narcotic agent status: Secondary | ICD-10-CM | POA: Diagnosis not present

## 2016-05-18 DIAGNOSIS — N3941 Urge incontinence: Secondary | ICD-10-CM | POA: Diagnosis not present

## 2016-05-18 DIAGNOSIS — Q761 Klippel-Feil syndrome: Secondary | ICD-10-CM | POA: Diagnosis not present

## 2016-05-18 DIAGNOSIS — N452 Orchitis: Secondary | ICD-10-CM | POA: Diagnosis not present

## 2016-05-18 DIAGNOSIS — Z87891 Personal history of nicotine dependence: Secondary | ICD-10-CM | POA: Diagnosis not present

## 2016-05-18 DIAGNOSIS — F329 Major depressive disorder, single episode, unspecified: Secondary | ICD-10-CM | POA: Diagnosis not present

## 2016-05-18 DIAGNOSIS — F419 Anxiety disorder, unspecified: Secondary | ICD-10-CM | POA: Diagnosis not present

## 2016-05-18 DIAGNOSIS — E291 Testicular hypofunction: Secondary | ICD-10-CM | POA: Diagnosis not present

## 2016-05-18 DIAGNOSIS — N529 Male erectile dysfunction, unspecified: Secondary | ICD-10-CM | POA: Diagnosis not present

## 2016-05-20 DIAGNOSIS — Q549 Hypospadias, unspecified: Secondary | ICD-10-CM | POA: Diagnosis not present

## 2016-05-25 DIAGNOSIS — F250 Schizoaffective disorder, bipolar type: Secondary | ICD-10-CM | POA: Diagnosis not present

## 2016-05-25 DIAGNOSIS — Z79899 Other long term (current) drug therapy: Secondary | ICD-10-CM | POA: Diagnosis not present

## 2016-05-25 DIAGNOSIS — F410 Panic disorder [episodic paroxysmal anxiety] without agoraphobia: Secondary | ICD-10-CM | POA: Diagnosis not present

## 2016-05-30 ENCOUNTER — Other Ambulatory Visit: Payer: Self-pay | Admitting: Family Medicine

## 2016-06-29 DIAGNOSIS — F4322 Adjustment disorder with anxiety: Secondary | ICD-10-CM | POA: Diagnosis not present

## 2016-06-29 DIAGNOSIS — Q761 Klippel-Feil syndrome: Secondary | ICD-10-CM | POA: Diagnosis not present

## 2016-06-29 DIAGNOSIS — D352 Benign neoplasm of pituitary gland: Secondary | ICD-10-CM | POA: Diagnosis not present

## 2016-06-29 DIAGNOSIS — Q540 Hypospadias, balanic: Secondary | ICD-10-CM | POA: Diagnosis not present

## 2016-06-29 DIAGNOSIS — E291 Testicular hypofunction: Secondary | ICD-10-CM | POA: Diagnosis not present

## 2016-06-29 DIAGNOSIS — Q02 Microcephaly: Secondary | ICD-10-CM | POA: Diagnosis not present

## 2016-06-29 DIAGNOSIS — E221 Hyperprolactinemia: Secondary | ICD-10-CM | POA: Diagnosis not present

## 2016-07-06 DIAGNOSIS — D352 Benign neoplasm of pituitary gland: Secondary | ICD-10-CM | POA: Diagnosis not present

## 2016-07-06 DIAGNOSIS — Q540 Hypospadias, balanic: Secondary | ICD-10-CM | POA: Diagnosis not present

## 2016-07-06 DIAGNOSIS — F4322 Adjustment disorder with anxiety: Secondary | ICD-10-CM | POA: Diagnosis not present

## 2016-07-06 DIAGNOSIS — E221 Hyperprolactinemia: Secondary | ICD-10-CM | POA: Diagnosis not present

## 2016-07-06 DIAGNOSIS — E785 Hyperlipidemia, unspecified: Secondary | ICD-10-CM | POA: Diagnosis not present

## 2016-07-06 DIAGNOSIS — Q761 Klippel-Feil syndrome: Secondary | ICD-10-CM | POA: Diagnosis not present

## 2016-07-06 DIAGNOSIS — E291 Testicular hypofunction: Secondary | ICD-10-CM | POA: Diagnosis not present

## 2016-07-06 DIAGNOSIS — F5101 Primary insomnia: Secondary | ICD-10-CM | POA: Diagnosis not present

## 2016-07-06 DIAGNOSIS — I10 Essential (primary) hypertension: Secondary | ICD-10-CM | POA: Diagnosis not present

## 2016-07-09 DIAGNOSIS — E291 Testicular hypofunction: Secondary | ICD-10-CM | POA: Diagnosis not present

## 2016-07-09 DIAGNOSIS — Q761 Klippel-Feil syndrome: Secondary | ICD-10-CM | POA: Diagnosis not present

## 2016-07-09 DIAGNOSIS — Q02 Microcephaly: Secondary | ICD-10-CM | POA: Diagnosis not present

## 2016-07-09 DIAGNOSIS — E221 Hyperprolactinemia: Secondary | ICD-10-CM | POA: Diagnosis not present

## 2016-07-09 DIAGNOSIS — D352 Benign neoplasm of pituitary gland: Secondary | ICD-10-CM | POA: Diagnosis not present

## 2016-07-22 ENCOUNTER — Encounter: Payer: Self-pay | Admitting: Family Medicine

## 2016-07-22 ENCOUNTER — Ambulatory Visit (INDEPENDENT_AMBULATORY_CARE_PROVIDER_SITE_OTHER): Payer: Medicare Other | Admitting: Family Medicine

## 2016-07-22 VITALS — BP 142/76 | HR 70 | Temp 98.2°F | Wt 190.8 lb

## 2016-07-22 DIAGNOSIS — R519 Headache, unspecified: Secondary | ICD-10-CM

## 2016-07-22 DIAGNOSIS — L0212 Furuncle of neck: Secondary | ICD-10-CM

## 2016-07-22 DIAGNOSIS — R51 Headache: Secondary | ICD-10-CM

## 2016-07-22 DIAGNOSIS — Q039 Congenital hydrocephalus, unspecified: Secondary | ICD-10-CM

## 2016-07-22 MED ORDER — DOXYCYCLINE HYCLATE 100 MG PO TABS: 100.0000 mg | ORAL_TABLET | Freq: Two times a day (BID) | ORAL | 0 refills | Status: DC

## 2016-07-22 MED ORDER — IBUPROFEN 600 MG PO TABS: 600.0000 mg | ORAL_TABLET | Freq: Three times a day (TID) | ORAL | 0 refills | Status: DC | PRN

## 2016-07-22 NOTE — Progress Notes (Signed)
Patient: Scott [name] Male    DOB: 01/08/1971   46 y.o.   MRN: [id number] Visit Date: 07/22/2016  Today's Provider: [name], PA   Chief Complaint  Patient presents with  . Headache   Subjective:    Headache   This is a new problem. Episode onset: 2 weeks ago. Episode frequency: episodes. The problem has been gradually worsening. The pain is located in the temporal, occipital and frontal region. The pain does not radiate. The pain quality is similar to prior headaches. The quality of the pain is described as pulsating. Associated symptoms include nausea. Exacerbated by: exertion, laughing, coughing, lifting. He has tried acetaminophen for the symptoms.   Patient Active Problem List   Diagnosis Date Noted  . Anxiety 09/11/2014  . Airway hyperreactivity 09/11/2014  . CN (constipation) 09/11/2014  . Alopecia, male pattern 09/11/2014  . Decreased libido 09/11/2014  . Abnormal respiratory rate 09/11/2014  . Dysfunction of eustachian tube 09/11/2014  . Groin strain 09/11/2014  . Cephalalgia 09/11/2014  . Umbilical hernia 35/70/1779  . Essential hypertension 09/11/2014  . Migraine 09/11/2014  . Neoplasm of uncertain behavior 39/06/90  . Hernia of anterior abdominal wall 09/11/2014  . Contact dermatitis due to Genus Toxicodendron 09/11/2014  . Open-angle glaucoma 05/20/2009  . Congenital hydrocephalus ([place]) 05/20/2009  . 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 Enteritis presumed infectious 06/11/2008  . HLD (hyperlipidemia) 03/15/2008  . Clinical depression 06/13/2007  . Essential (primary) hypertension 06/13/2007   Past Surgical History:  Procedure Laterality Date  . COLOSTOMY    . DENVER SHUNT PLACEMENT     Family History  Problem Relation Age of Onset  . Multiple sclerosis Mother   . Colon cancer Father   . Heart disease Father   . Lung cancer Maternal Grandmother   . Thyroid cancer Maternal Grandfather   . Ovarian cancer Paternal Grandmother   . Cancer Paternal  Grandfather    Allergies  Allergen Reactions  . Codeine      Previous Medications   ANDROGEL PUMP 20.25 MG/ACT (1.62%) GEL       BRINZOLAMIDE (AZOPT) 1 % OPHTHALMIC SUSPENSION    Apply 1 drop to eye daily.   BROMOCRIPTINE (PARLODEL) 2.5 MG TABLET    TK 3 TS PO QAM   DIAZEPAM (VALIUM) 2 MG TABLET    Take 1 tablet by mouth as needed.   LATANOPROST (XALATAN) 0.005 % OPHTHALMIC SOLUTION    1 drop at bedtime.   MELOXICAM (MOBIC) 7.5 MG TABLET       PAROXETINE (PAXIL) 40 MG TABLET    Take 1 tablet by mouth daily.   QUINAPRIL (ACCUPRIL) 20 MG TABLET    TAKE 1 TABLET BY MOUTH EVERY DAY    Review of Systems  Constitutional: Negative.   Eyes:       Eye twitching   Respiratory: Negative.   Cardiovascular: Negative.   Gastrointestinal: Positive for nausea.  Neurological: Positive for headaches.    Social History  Substance Use Topics  . Smoking status: Former Smoker    Years: 10.00  . Smokeless tobacco: Never Used     Comment: quit 05/2006  . Alcohol use No   Objective:   BP (!) 142/76 (BP Location: Right Arm, Patient Position: Sitting, Cuff Size: Normal)   Pulse 70   Temp 98.2 F (36.8 C) (Oral)   Wt 190 lb 12.8 oz (86.5 kg)   SpO2 96%   BMI 34.90 kg/m   Physical Exam  Constitutional: He is oriented to person,  place, and time. He appears well-developed and well-nourished. No distress.  HENT:  Head: Normocephalic and atraumatic.  Right Ear: Hearing normal.  Left Ear: Hearing normal.  Nose: Nose normal.  Eyes: Conjunctivae and lids are normal. Right eye exhibits no discharge. Left eye exhibits no discharge. No scleral icterus.  Neck: Neck supple.  Hydrocephalus Shunt palpable along the right SCM muscle up to the scalp and empties into the abdomen.  Cardiovascular: Normal rate and regular rhythm.   Pulmonary/Chest: Effort normal. No respiratory distress.  Abdominal: Soft. Bowel sounds are normal.  Neurological: He is alert and oriented to person, place, and time.  Skin:  Skin is intact. No lesion noted.  Small furuncle left posterior neck at the occiput. Erythematous with central scab and some purulent drainage.  Psychiatric: He has a normal mood and affect. His speech is normal and behavior is normal. Thought content normal.      Assessment & Plan:     1. Nonintractable episodic headache, unspecified headache type Onset over the past 2 weeks. Describes a s pressure headache in the occiput radiating to the frontal area and occasional nausea. Headache worse with straining, lifting or with BM. No weakness, numbness or dizziness. Will treat with Ibuprofen and get CT scan of head. May need evaluation by neurologist/neurosurgeon pending reports. - ibuprofen (ADVIL,MOTRIN) 600 MG tablet; Take 1 tablet (600 mg total) by mouth every 8 (eight) hours as needed.  Dispense: 30 tablet; Refill: 0 - CT Head Wo Contrast  2. Congenital hydrocephalus ([place]) History of Kleppel Feil syndrome with multiple skeletal deformities and congenital hydrocephalus. Shunt in normal position without tenderness, neurologic deficits or fever. Worried about blockage of shunt causing headaches. Will get CT scan and recheck pending report. - CT Head Wo Contrast  3. Furuncle of neck Small boil on the left posterior neck approximately 1 cm in diameter with central scab. Purulent fluid expressed when scab removed and obtained specimen for culture. Will start Doxycycline and recheck pending report. - Wound culture - doxycycline (VIBRA-TABS) 100 MG tablet; Take 1 tablet (100 mg total) by mouth 2 (two) times daily.  Dispense: 20 tablet; Refill: 0

## 2016-07-25 LAB — WOUND CULTURE: Organism ID, Bacteria: NONE SEEN

## 2016-07-28 ENCOUNTER — Ambulatory Visit
Admission: RE | Admit: 2016-07-28 | Discharge: 2016-07-28 | Disposition: A | Discharge status: Home / Self Care | Payer: Medicare Other | Source: Ambulatory Visit | Attending: Family Medicine | Admitting: Family Medicine

## 2016-07-28 DIAGNOSIS — Q039 Congenital hydrocephalus, unspecified: Secondary | ICD-10-CM | POA: Diagnosis not present

## 2016-07-28 DIAGNOSIS — G4484 Primary exertional headache: Secondary | ICD-10-CM | POA: Diagnosis not present

## 2016-07-28 DIAGNOSIS — R51 Headache: Secondary | ICD-10-CM | POA: Diagnosis not present

## 2016-08-02 ENCOUNTER — Telehealth: Payer: Self-pay | Admitting: Family Medicine

## 2016-08-02 NOTE — Telephone Encounter (Signed)
Pt called back want resulting from ct of head/  Please call back at [phone].  Please leave message if he does not answer.  Thanks [employer]

## 2016-08-02 NOTE — Telephone Encounter (Signed)
Left patient a detailed message on voicemail. Advised him to call back if he is still having headaches so referral to neuro can be started.

## 2016-08-02 NOTE — Telephone Encounter (Signed)
-----   Message from Margo Common, [place] sent at 07/29/2016  1:34 PM EDT ----- CT scan shows shunt is stable and no acute intracranial abnormalities. If headache does not abate with present treatment, may need referral to a neurologist for further evaluation.

## 2016-08-12 ENCOUNTER — Encounter: Payer: Self-pay | Admitting: Family Medicine

## 2016-08-12 ENCOUNTER — Ambulatory Visit (INDEPENDENT_AMBULATORY_CARE_PROVIDER_SITE_OTHER): Payer: Medicare Other | Admitting: Family Medicine

## 2016-08-12 VITALS — BP 122/84 | HR 80 | Temp 98.5°F | Resp 17 | Wt 190.0 lb

## 2016-08-12 DIAGNOSIS — L309 Dermatitis, unspecified: Secondary | ICD-10-CM | POA: Diagnosis not present

## 2016-08-12 DIAGNOSIS — R1032 Left lower quadrant pain: Secondary | ICD-10-CM

## 2016-08-12 LAB — POCT URINALYSIS DIPSTICK
Bilirubin, UA: NEGATIVE
Blood, UA: NEGATIVE
Glucose, UA: NEGATIVE
Ketones, UA: NEGATIVE
LEUKOCYTES UA: NEGATIVE
Nitrite, UA: NEGATIVE
SPEC GRAV UA: 1.015 (ref 1.010–1.025)
UROBILINOGEN UA: 0.2 U/dL
pH, UA: 5 (ref 5.0–8.0)

## 2016-08-12 MED ORDER — CIPROFLOXACIN HCL 500 MG PO TABS: 500.0000 mg | ORAL_TABLET | Freq: Two times a day (BID) | ORAL | 0 refills | Status: DC

## 2016-08-12 NOTE — Patient Instructions (Signed)
Epididymitis Epididymitis is swelling (inflammation) of the epididymis. The epididymis is a cord-like structure that is located along the top and back part of the testicle. It collects and stores sperm from the testicle. This condition can also cause pain and swelling of the testicle and scrotum. Symptoms usually start suddenly (acute epididymitis). Sometimes epididymitis starts gradually and lasts for a while (chronic epididymitis). This type may be harder to treat. What are the causes? In men 52 and younger, this condition is usually caused by a bacterial infection or sexually transmitted disease (STD), such as:  Gonorrhea.  Chlamydia. In men 72 and older who do not have anal sex, this condition is usually caused by bacteria from a blockage or abnormalities in the urinary system. These can result from:  Having a tube placed into the bladder (urinary catheter).  Having an enlarged or inflamed prostate gland.  Having recent urinary tract surgery. In men who have a condition that weakens the body's defense system (immune system), such as HIV, this condition can be caused by:  Other bacteria, including tuberculosis and syphilis.  Viruses.  Fungi. Sometimes this condition occurs without infection. That may happen if urine flows backward into the epididymis after heavy lifting or straining. What increases the risk? This condition is more likely to develop in men:  Who have unprotected sex with more than one partner.  Who have anal sex.  Who have recently had surgery.  Who have a urinary catheter.  Who have urinary problems.  Who have a suppressed immune system. What are the signs or symptoms? This condition usually begins suddenly with chills, fever, and pain behind the scrotum and in the testicle. Other symptoms include:  Swelling of the scrotum, testicle, or both.  Pain whenejaculatingor urinating.  Pain in the back or belly.  Nausea.  Itching and discharge from the  penis.  Frequent need to pass urine.  Redness and tenderness of the scrotum. How is this diagnosed? Your health care provider can diagnose this condition based on your symptoms and medical history. Your health care provider will also do a physical exam to ask about your symptoms and check your scrotum and testicle for swelling, pain, and redness. You may also have other tests, including:  Examination of discharge from the penis.  Urine tests for infections, such as STDs. Your health care provider may test you for other STDs, including HIV. How is this treated? Treatment for this condition depends on the cause. If your condition is caused by a bacterial infection, oral antibiotic medicine may be prescribed. If the bacterial infection has spread to your blood, you may need to receive IV antibiotics. Nonbacterial epididymitis is treated with home care that includes bed rest and elevation of the scrotum. Surgery may be needed to treat:  Bacterial epididymitis that causes pus to build up in the scrotum (abscess).  Chronic epididymitis that has not responded to other treatments. Follow these instructions at home: Medicines   Take over-the-counter and prescription medicines only as told by your health care provider.  If you were prescribed an antibiotic medicine, take it as told by your health care provider. Do not stop taking the antibiotic even if your condition improves. Sexual Activity   If your epididymitis was caused by an STD, avoid sexual activity until your treatment is complete.  Inform your sexual partner or partners if you test positive for an STD. They may need to be treated.Do not engage in sexual activity with your partner or partners until their treatment is  completed. General instructions   Return to your normal activities as told by your health care provider. Ask your health care provider what activities are safe for you.  Keep your scrotum elevated and supported while  resting. Ask your health care provider if you should wear a scrotal support, such as a jockstrap. Wear it as told by your health care provider.  If directed, apply ice to the affected area:  Put ice in a plastic bag.  Place a towel between your skin and the bag.  Leave the ice on for 20 minutes, 2-3 times per day.  Try taking a sitz bath to help with discomfort. This is a warm water bath that is taken while you are sitting down. The water should only come up to your hips and should cover your buttocks. Do this 3-4 times per day or as told by your health care provider.  Keep all follow-up visits as told by your health care provider. This is important. Contact a health care provider if:  You have a fever.  Your pain medicine is not helping.  Your pain is getting worse.  Your symptoms do not improve within three days. This information is not intended to replace advice given to you by your health care provider. Make sure you discuss any questions you have with your health care provider. Document Released: 03/19/2000 Document Revised: 08/28/2015 Document Reviewed: 08/07/2014 Elsevier Interactive Patient Education  2017 [employer].

## 2016-08-12 NOTE — Progress Notes (Signed)
Patient: Scott [name] Male    DOB: November 28, 1970   46 y.o.   MRN: [id number] Visit Date: 08/12/2016  Today's Provider: [name], PA   Chief Complaint  Patient presents with  . Neck Pain    Follow up from 07/22/16 furnucle of neck.  . Groin Pain    Patient has concerns of urinary tract infection.    Subjective:    Groin Pain  The patient's primary symptoms include pelvic pain and testicular pain. This is a new problem. The current episode started yesterday. The problem occurs constantly. The problem has been unchanged. The pain is mild. Pertinent negatives include no abdominal pain, chest pain, chills, constipation, coughing, diarrhea, discolored urine, dysuria, fever, flank pain, frequency, headaches, hematuria, nausea, urgency, urinary retention or vomiting. The testicular pain affects the left testicle. The color of the testicles is normal. The symptoms are aggravated by urination. He has tried nothing for the symptoms.   Patient returns back to office today for follow up visit, at last visit on 07/22/16 furuncle of neck was cultured. Culture showed scant bacterial growth, patient was started on Doxycycline 100mg . Patient reports that he has completed antibiotic but skin is still itchy and burning at home. Patient states that neck is tender to the touch, but denies any new symptoms.     Patient Active Problem List   Diagnosis Date Noted  . Anxiety 09/11/2014  . Airway hyperreactivity 09/11/2014  . CN (constipation) 09/11/2014  . Alopecia, male pattern 09/11/2014  . Decreased libido 09/11/2014  . Abnormal respiratory rate 09/11/2014  . Dysfunction of eustachian tube 09/11/2014  . Groin strain 09/11/2014  . Cephalalgia 09/11/2014  . Umbilical hernia 87/68/1157  . Essential hypertension 09/11/2014  . Migraine 09/11/2014  . Neoplasm of uncertain behavior 26/20/3559  . Hernia of anterior abdominal wall 09/11/2014  . Contact dermatitis due to Genus Toxicodendron 09/11/2014    . Open-angle glaucoma 05/20/2009  . Congenital hydrocephalus ([place]) 05/20/2009  . 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 Enteritis presumed infectious 06/11/2008  . HLD (hyperlipidemia) 03/15/2008  . Clinical depression 06/13/2007  . Essential (primary) hypertension 06/13/2007   Past Surgical History:  Procedure Laterality Date  . COLOSTOMY    . DENVER SHUNT PLACEMENT     Family History  Problem Relation Age of Onset  . Multiple sclerosis Mother   . Colon cancer Father   . Heart disease Father   . Lung cancer Maternal Grandmother   . Thyroid cancer Maternal Grandfather   . Ovarian cancer Paternal Grandmother   . Cancer Paternal Grandfather    Allergies  Allergen Reactions  . Codeine     Current Outpatient Prescriptions:  .  ANDROGEL PUMP 20.25 MG/ACT (1.62%) GEL, , Disp: , Rfl:  .  brinzolamide (AZOPT) 1 % ophthalmic suspension, Apply 1 drop to eye daily., Disp: , Rfl:  .  bromocriptine (PARLODEL) 2.5 MG tablet, TK 3 TS PO QAM, Disp: , Rfl: 0 .  diazepam (VALIUM) 2 MG tablet, Take 1 tablet by mouth as needed., Disp: , Rfl:  .  ibuprofen (ADVIL,MOTRIN) 600 MG tablet, Take 1 tablet (600 mg total) by mouth every 8 (eight) hours as needed., Disp: 30 tablet, Rfl: 0 .  latanoprost (XALATAN) 0.005 % ophthalmic solution, 1 drop at bedtime., Disp: , Rfl:  .  PARoxetine (PAXIL) 40 MG tablet, Take 1 tablet by mouth daily., Disp: , Rfl:  .  quinapril (ACCUPRIL) 20 MG tablet, TAKE 1 TABLET BY MOUTH EVERY DAY, Disp: 90 tablet,  Rfl: 0  Review of Systems  Constitutional: Negative for chills and fever.  Respiratory: Negative for cough.   Cardiovascular: Negative for chest pain.  Gastrointestinal: Negative for abdominal pain, constipation, diarrhea, nausea and vomiting.  Genitourinary: Positive for pelvic pain and testicular pain. Negative for dysuria, flank pain, frequency and urgency.  Neurological: Negative for headaches.    Social History  Substance Use Topics  . Smoking status:  Former Smoker    Years: 10.00  . Smokeless tobacco: Never Used     Comment: quit 05/2006  . Alcohol use No   Objective:   BP 122/84   Pulse 80   Temp 98.5 F (36.9 C) (Oral)   Resp 17   Wt 190 lb (86.2 kg)   SpO2 92%   BMI 34.75 kg/m  Vitals:   08/12/16 1127  BP: 122/84  Pulse: 80  Resp: 17  Temp: 98.5 F (36.9 C)  TempSrc: Oral  SpO2: 92%  Weight: 190 lb (86.2 kg)     Physical Exam  Constitutional: He appears well-developed and well-nourished.  HENT:  Head: Normocephalic.  Right Ear: External ear normal.  Left Ear: External ear normal.  Nose: Nose normal.  Eyes: Conjunctivae are normal.  Neck: Neck supple.  Cardiovascular: Normal rate and regular rhythm.   Pulmonary/Chest: Effort normal.  Abdominal: Soft.  Genitourinary:  Genitourinary Comments: Congenital hypospadias. Some tenderness behind the left testicle. No swelling or signs of hernia.  Lymphadenopathy:    He has no cervical adenopathy.  Skin: Rash noted.  Left posterior hair line. No pustules or remaining signs of furuncle. Slight pruritis of rash in a 5 x 1.5 cm area. No blisters or scabs.      Assessment & Plan:     1. Groin pain, left Onset yesterday without known injury. No dysuria. Some tenderness behind the left testicle up to the groin. Suspect epididymitis. Will get urine culture as urinalysis showed some WBC's. Treat with Cipro and may use hot soaks for discomfort. Recheck if any swelling. Follow up pending culture report. - POCT urinalysis dipstick - Urine culture - ciprofloxacin (CIPRO) 500 MG tablet; Take 1 tablet (500 mg total) by mouth 2 (two) times daily.  Dispense: 20 tablet; Refill: 0  2. Dermatitis Slight contact dermatitis left posterior hairline. May use Hydrocortisone cream prn TID. Recheck prn.       [name], PA  [facility]

## 2016-08-13 ENCOUNTER — Telehealth: Payer: Self-pay

## 2016-08-13 NOTE — Telephone Encounter (Signed)
Just got started on the antibiotic (Cipro) for epididymitis yesterday and the urine culture has not finished yet. Would wait on urology referral. They would want him to take all the antibiotic and get this report back first.

## 2016-08-13 NOTE — Telephone Encounter (Signed)
Mother called and wanted to let Simona Huh know that patient would like to proceed with referral for Neurology. KW

## 2016-08-14 LAB — URINE CULTURE: Organism ID, Bacteria: NO GROWTH

## 2016-08-14 NOTE — Progress Notes (Signed)
No growth on the culture. Continue antibiotic. If still having no improvement, will schedule an urology referral.

## 2016-08-17 ENCOUNTER — Telehealth: Payer: Self-pay

## 2016-08-17 NOTE — Telephone Encounter (Signed)
-----   Message from Margo Common, [place] sent at 08/14/2016 11:26 PM EDT ----- No growth on the culture. Continue antibiotic. If still having no improvement, will schedule an urology referral.

## 2016-08-17 NOTE — Telephone Encounter (Signed)
Left message advising pt per DPR. Renaldo Fiddler, CMA

## 2016-08-19 DIAGNOSIS — H40153 Residual stage of open-angle glaucoma, bilateral: Secondary | ICD-10-CM | POA: Diagnosis not present

## 2016-08-27 ENCOUNTER — Ambulatory Visit: Payer: Self-pay | Admitting: Family Medicine

## 2016-08-30 ENCOUNTER — Other Ambulatory Visit: Payer: Self-pay | Admitting: Family Medicine

## 2016-09-07 ENCOUNTER — Ambulatory Visit (INDEPENDENT_AMBULATORY_CARE_PROVIDER_SITE_OTHER): Payer: Medicare Other | Admitting: Family Medicine

## 2016-09-07 ENCOUNTER — Encounter: Payer: Self-pay | Admitting: Family Medicine

## 2016-09-07 VITALS — BP 132/84 | HR 74 | Temp 98.3°F | Wt 191.8 lb

## 2016-09-07 DIAGNOSIS — K439 Ventral hernia without obstruction or gangrene: Secondary | ICD-10-CM

## 2016-09-07 DIAGNOSIS — M719 Bursopathy, unspecified: Secondary | ICD-10-CM | POA: Insufficient documentation

## 2016-09-07 DIAGNOSIS — M7550 Bursitis of unspecified shoulder: Secondary | ICD-10-CM

## 2016-09-07 DIAGNOSIS — R1013 Epigastric pain: Secondary | ICD-10-CM | POA: Diagnosis not present

## 2016-09-07 DIAGNOSIS — M7540 Impingement syndrome of unspecified shoulder: Secondary | ICD-10-CM | POA: Insufficient documentation

## 2016-09-07 NOTE — Progress Notes (Signed)
Patient: Scott [name] Male    DOB: 1970-05-11   46 y.o.   MRN: [id number] Visit Date: 09/07/2016  Today's Provider: [name], PA   Chief Complaint  Patient presents with  . Abdominal Pain   Subjective:    Abdominal Pain  This is a new problem. Episode onset: last week. The onset quality is sudden. The problem occurs constantly. The problem has been unchanged. The pain is located in the epigastric region. The pain is moderate (severe last week gradually improving). The quality of the pain is aching. Nothing aggravates the pain. The pain is relieved by nothing. abdominal hernia  Patient states he rolled over in the bed onto the plug for the electric blanket and since he has had abdominal pain.   Patient Active Problem List   Diagnosis Date Noted  . Disorder of bursae of shoulder region 09/07/2016  . Impingement syndrome of shoulder region 09/07/2016  . Anxiety 09/11/2014  . Airway hyperreactivity 09/11/2014  . CN (constipation) 09/11/2014  . Alopecia, male pattern 09/11/2014  . Decreased libido 09/11/2014  . Abnormal respiratory rate 09/11/2014  . Dysfunction of eustachian tube 09/11/2014  . Groin strain 09/11/2014  . Cephalalgia 09/11/2014  . Umbilical hernia 40/01/2724  . Essential hypertension 09/11/2014  . Migraine 09/11/2014  . Neoplasm of uncertain behavior 36/64/4034  . Hernia of anterior abdominal wall 09/11/2014  . Contact dermatitis due to Genus Toxicodendron 09/11/2014  . Open-angle glaucoma 05/20/2009  . Congenital hydrocephalus ([place]) 05/20/2009  . 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 Enteritis presumed infectious 06/11/2008  . HLD (hyperlipidemia) 03/15/2008  . Clinical depression 06/13/2007  . Essential (primary) hypertension 06/13/2007   Past Surgical History:  Procedure Laterality Date  . COLOSTOMY    . DENVER SHUNT PLACEMENT     Family History  Problem Relation Age of Onset  . Multiple sclerosis Mother   . Colon cancer Father   . Heart disease  Father   . Lung cancer Maternal Grandmother   . Thyroid cancer Maternal Grandfather   . Ovarian cancer Paternal Grandmother   . Cancer Paternal Grandfather    Allergies  Allergen Reactions  . Codeine      Previous Medications   ANDROGEL PUMP 20.25 MG/ACT (1.62%) GEL       BRINZOLAMIDE (AZOPT) 1 % OPHTHALMIC SUSPENSION    Apply 1 drop to eye daily.   BROMOCRIPTINE (PARLODEL) 2.5 MG TABLET    TK 3 TS PO QAM   DIAZEPAM (VALIUM) 2 MG TABLET    Take 1 tablet by mouth as needed.   IBUPROFEN (ADVIL,MOTRIN) 600 MG TABLET    Take 1 tablet (600 mg total) by mouth every 8 (eight) hours as needed.   LATANOPROST (XALATAN) 0.005 % OPHTHALMIC SOLUTION    1 drop at bedtime.   PAROXETINE (PAXIL) 40 MG TABLET    Take 1 tablet by mouth daily.   QUINAPRIL (ACCUPRIL) 20 MG TABLET    TAKE 1 TABLET BY MOUTH EVERY DAY    Review of Systems  Gastrointestinal: Positive for abdominal pain.    Social History  Substance Use Topics  . Smoking status: Former Smoker    Years: 10.00  . Smokeless tobacco: Never Used     Comment: quit 05/2006  . Alcohol use No   Objective:   BP 132/84 (BP Location: Right Arm, Patient Position: Sitting, Cuff Size: Normal)   Pulse 74   Temp 98.3 F (36.8 C) (Oral)   Wt 191 lb 12.8 oz (87 kg)   SpO2 98%  BMI 35.08 kg/m   Physical Exam  Constitutional: He is oriented to person, place, and time. He appears well-developed and well-nourished. No distress.  HENT:  Head: Normocephalic and atraumatic.  Right Ear: Hearing normal.  Left Ear: Hearing normal.  Nose: Nose normal.  Eyes: Conjunctivae and lids are normal. Right eye exhibits no discharge. Left eye exhibits no discharge. No scleral icterus.  Pulmonary/Chest: Effort normal. No respiratory distress.  Abdominal: Soft. There is tenderness. There is guarding.  Epigastric pain to palpation with ventral hernia. Epigastric scar from colostomy as an infant with congenital defects.  Neurological: He is alert and oriented to  person, place, and time.  Skin: Skin is intact. No lesion and no rash noted.  Psychiatric: He has a normal mood and affect. His speech is normal and behavior is normal. Thought content normal.      Assessment & Plan:     1. Epigastric pain Epigastric pain and guarding with palpation. Onset 1 week ago when he rolled over onto an electric blanket control in the bed. No vomiting, hematemesis, diarrhea or melena. History of a ventral hernia and scar across the epigastrium from colostomy as an infant due to congenital defects. Schedule CT scan to rule out trapping of hernia versus adhesions. - CT Abdomen Pelvis W Contrast  2. Hernia of anterior abdominal wall Pain in epigastrium with swelling of ventral hernia. Schedule CT scan to rule out incarceration. - CT Abdomen Pelvis W Contrast

## 2016-09-09 DIAGNOSIS — N529 Male erectile dysfunction, unspecified: Secondary | ICD-10-CM | POA: Diagnosis not present

## 2016-09-14 ENCOUNTER — Ambulatory Visit: Admission: RE | Admit: 2016-09-14 | Payer: Medicare Other | Source: Ambulatory Visit

## 2016-10-14 DIAGNOSIS — I10 Essential (primary) hypertension: Secondary | ICD-10-CM | POA: Diagnosis not present

## 2016-10-14 DIAGNOSIS — E291 Testicular hypofunction: Secondary | ICD-10-CM | POA: Diagnosis not present

## 2016-10-14 DIAGNOSIS — E221 Hyperprolactinemia: Secondary | ICD-10-CM | POA: Diagnosis not present

## 2016-10-14 DIAGNOSIS — E785 Hyperlipidemia, unspecified: Secondary | ICD-10-CM | POA: Diagnosis not present

## 2016-10-14 DIAGNOSIS — F4322 Adjustment disorder with anxiety: Secondary | ICD-10-CM | POA: Diagnosis not present

## 2016-10-14 DIAGNOSIS — D352 Benign neoplasm of pituitary gland: Secondary | ICD-10-CM | POA: Diagnosis not present

## 2016-10-21 DIAGNOSIS — F5101 Primary insomnia: Secondary | ICD-10-CM | POA: Diagnosis not present

## 2016-10-21 DIAGNOSIS — F4322 Adjustment disorder with anxiety: Secondary | ICD-10-CM | POA: Diagnosis not present

## 2016-10-21 DIAGNOSIS — I10 Essential (primary) hypertension: Secondary | ICD-10-CM | POA: Diagnosis not present

## 2016-10-21 DIAGNOSIS — Q540 Hypospadias, balanic: Secondary | ICD-10-CM | POA: Diagnosis not present

## 2016-10-21 DIAGNOSIS — Q761 Klippel-Feil syndrome: Secondary | ICD-10-CM | POA: Diagnosis not present

## 2016-10-21 DIAGNOSIS — E785 Hyperlipidemia, unspecified: Secondary | ICD-10-CM | POA: Diagnosis not present

## 2016-10-21 DIAGNOSIS — E291 Testicular hypofunction: Secondary | ICD-10-CM | POA: Diagnosis not present

## 2016-10-21 DIAGNOSIS — E221 Hyperprolactinemia: Secondary | ICD-10-CM | POA: Diagnosis not present

## 2016-10-21 DIAGNOSIS — D352 Benign neoplasm of pituitary gland: Secondary | ICD-10-CM | POA: Diagnosis not present

## 2016-10-28 ENCOUNTER — Telehealth: Payer: Self-pay | Admitting: Family Medicine

## 2016-10-28 NOTE — Telephone Encounter (Signed)
Pt cancelled appointment for CT of abd/pelvis that was ordered June 2017

## 2016-10-29 NOTE — Telephone Encounter (Signed)
Ask patient if abdominal discomfort resolved since he did not get the CT scan ordered 09-07-16.

## 2016-10-29 NOTE — Telephone Encounter (Signed)
Patient states he canceled appointment because abdominal pain resolved.

## 2016-11-16 ENCOUNTER — Ambulatory Visit (INDEPENDENT_AMBULATORY_CARE_PROVIDER_SITE_OTHER): Payer: Medicare Other | Admitting: Family Medicine

## 2016-11-16 ENCOUNTER — Encounter: Payer: Self-pay | Admitting: Family Medicine

## 2016-11-16 VITALS — BP 130/88 | HR 82 | Temp 98.4°F | Wt 190.4 lb

## 2016-11-16 DIAGNOSIS — B372 Candidiasis of skin and nail: Secondary | ICD-10-CM | POA: Diagnosis not present

## 2016-11-16 DIAGNOSIS — R300 Dysuria: Secondary | ICD-10-CM

## 2016-11-16 LAB — POCT URINALYSIS DIPSTICK
Bilirubin, UA: NEGATIVE
Glucose, UA: NEGATIVE
KETONES UA: NEGATIVE
Leukocytes, UA: NEGATIVE
Nitrite, UA: NEGATIVE
PH UA: 6 (ref 5.0–8.0)
SPEC GRAV UA: 1.02 (ref 1.010–1.025)
UROBILINOGEN UA: 0.2 U/dL

## 2016-11-16 MED ORDER — SULFAMETHOXAZOLE-TRIMETHOPRIM 800-160 MG PO TABS: 1.0000 | ORAL_TABLET | Freq: Two times a day (BID) | ORAL | 0 refills | Status: DC

## 2016-11-16 NOTE — Progress Notes (Signed)
Patient: Scott [name] Male    DOB: [birth year]   46 y.o.   MRN: [id number] Visit Date: 11/16/2016  Today's Provider: [name], PA   Chief Complaint  Patient presents with  . Dysuria   Subjective:    Dysuria   This is a new problem. Episode onset: Friday. The problem occurs intermittently. The problem has been unchanged. The quality of the pain is described as burning. Associated symptoms include frequency. Associated symptoms comments: Groin pain and left leg pain . Treatments tried: baking soda and water. The treatment provided moderate relief.   Patient Active Problem List   Diagnosis Date Noted  . Erectile dysfunction 09/09/2016  . Disorder of bursae of shoulder region 09/07/2016  . Impingement syndrome of shoulder region 09/07/2016  . Anxiety 09/11/2014  . Airway hyperreactivity 09/11/2014  . CN (constipation) 09/11/2014  . Alopecia, male pattern 09/11/2014  . Decreased libido 09/11/2014  . Abnormal respiratory rate 09/11/2014  . Dysfunction of eustachian tube 09/11/2014  . Groin strain 09/11/2014  . Cephalalgia 09/11/2014  . Umbilical hernia 88/28/0034  . Essential hypertension 09/11/2014  . Migraine 09/11/2014  . Neoplasm of uncertain behavior 91/79/1505  . Hernia of anterior abdominal wall 09/11/2014  . Contact dermatitis due to Genus Toxicodendron 09/11/2014  . Open-angle glaucoma 05/20/2009  . Congenital hydrocephalus ([place]) 05/20/2009  . 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 Enteritis presumed infectious 06/11/2008  . HLD (hyperlipidemia) 03/15/2008  . Clinical depression 06/13/2007  . Essential (primary) hypertension 06/13/2007   Past Surgical History:  Procedure Laterality Date  . COLOSTOMY    . DENVER SHUNT PLACEMENT     Family History  Problem Relation Age of Onset  . Multiple sclerosis Mother   . Colon cancer Father   . Heart disease Father   . Lung cancer Maternal Grandmother   . Thyroid cancer Maternal Grandfather   . Ovarian cancer Paternal  Grandmother   . Cancer Paternal Grandfather    Allergies  Allergen Reactions  . Codeine      Previous Medications   ANDROGEL PUMP 20.25 MG/ACT (1.62%) GEL       BRINZOLAMIDE (AZOPT) 1 % OPHTHALMIC SUSPENSION    Apply 1 drop to eye daily.   BROMOCRIPTINE (PARLODEL) 2.5 MG TABLET    TK 3 TS PO QAM   DIAZEPAM (VALIUM) 2 MG TABLET    Take 1 tablet by mouth as needed.   IBUPROFEN (ADVIL,MOTRIN) 600 MG TABLET    Take 1 tablet (600 mg total) by mouth every 8 (eight) hours as needed.   LATANOPROST (XALATAN) 0.005 % OPHTHALMIC SOLUTION    1 drop at bedtime.   PAROXETINE (PAXIL) 40 MG TABLET    Take 1 tablet by mouth daily.   QUINAPRIL (ACCUPRIL) 20 MG TABLET    TAKE 1 TABLET BY MOUTH EVERY DAY   SILDENAFIL (REVATIO) 20 MG TABLET    Take 1-5 tablets by mouth. 1 hour before needed. Max 5 tablets per 24 hours.    Review of Systems  Constitutional: Negative.   Respiratory: Negative.   Cardiovascular: Negative.   Gastrointestinal: Positive for abdominal pain.  Genitourinary: Positive for dysuria and frequency.  Musculoskeletal: Positive for back pain.    Social History  Substance Use Topics  . Smoking status: Former Smoker    Years: 10.00  . Smokeless tobacco: Never Used     Comment: quit 05/2006  . Alcohol use No   Objective:   BP 130/88 (BP Location: Right Arm, Patient Position: Sitting, Cuff Size: Normal)  Pulse 82   Temp 98.4 F (36.9 C) (Oral)   Wt 190 lb 6.4 oz (86.4 kg)   SpO2 95%   BMI 34.82 kg/m   Physical Exam  Constitutional: He is oriented to person, place, and time. He appears well-developed and well-nourished. No distress.  HENT:  Head: Normocephalic and atraumatic.  Right Ear: Hearing normal.  Left Ear: Hearing normal.  Nose: Nose normal.  Eyes: Conjunctivae and lids are normal. Right eye exhibits no discharge. Left eye exhibits no discharge. No scleral icterus.  Pulmonary/Chest: Effort normal. No respiratory distress.  Abdominal: Bowel sounds are normal.    Genitourinary:  Genitourinary Comments: History of congenital hypospadias.  Neurological: He is alert and oriented to person, place, and time.  Skin: Skin is intact. Rash noted. No lesion noted.  Sore pink rash in folds of skin around scrotum and lower abdomen.  Psychiatric: He has a normal mood and affect. His speech is normal and behavior is normal. Thought content normal.      Assessment & Plan:     1. Dysuria Onset one week ago. History of congenital hypospadias. No recheck urethral discharge. Some bacteria and a few WBC's on microscopic urinalysis. Will get C&S and start antibiotic. Increase water intake and recheck pending culture report. - POCT Urinalysis Dipstick - Urine Culture - sulfamethoxazole-trimethoprim (BACTRIM DS,SEPTRA DS) 800-160 MG tablet; Take 1 tablet by mouth 2 (two) times daily.  Dispense: 20 tablet; Refill: 0  2. Candidal dermatitis Pink to red rash on upper inner thighs. May treat with Zeasorb-AF powder BID and clean area daily. Recheck prn.

## 2016-11-18 LAB — URINE CULTURE

## 2016-11-26 DIAGNOSIS — F4310 Post-traumatic stress disorder, unspecified: Secondary | ICD-10-CM | POA: Diagnosis not present

## 2016-11-26 DIAGNOSIS — F331 Major depressive disorder, recurrent, moderate: Secondary | ICD-10-CM | POA: Diagnosis not present

## 2016-11-29 ENCOUNTER — Ambulatory Visit: Payer: Medicare Other | Admitting: Family Medicine

## 2016-11-29 NOTE — Progress Notes (Deleted)
   Patient: Scott [name] Male    DOB: 1970-12-06   46 y.o.   MRN: [id number] Visit Date: 11/29/2016  Today's Provider: [name], PA   No chief complaint on file.  Subjective:    Groin Pain  Chronicity: follow up from OV on 8/14. Treatments tried: started Bactrium on 8/14.      Previous Medications   ANDROGEL PUMP 20.25 MG/ACT (1.62%) GEL       BRINZOLAMIDE (AZOPT) 1 % OPHTHALMIC SUSPENSION    Apply 1 drop to eye daily.   BROMOCRIPTINE (PARLODEL) 2.5 MG TABLET    TK 3 TS PO QAM   DIAZEPAM (VALIUM) 2 MG TABLET    Take 1 tablet by mouth as needed.   IBUPROFEN (ADVIL,MOTRIN) 600 MG TABLET    Take 1 tablet (600 mg total) by mouth every 8 (eight) hours as needed.   LATANOPROST (XALATAN) 0.005 % OPHTHALMIC SOLUTION    1 drop at bedtime.   PAROXETINE (PAXIL) 40 MG TABLET    Take 1 tablet by mouth daily.   QUINAPRIL (ACCUPRIL) 20 MG TABLET    TAKE 1 TABLET BY MOUTH EVERY DAY   SILDENAFIL (REVATIO) 20 MG TABLET    Take 1-5 tablets by mouth. 1 hour before needed. Max 5 tablets per 24 hours.   SULFAMETHOXAZOLE-TRIMETHOPRIM (BACTRIM DS,SEPTRA DS) 800-160 MG TABLET    Take 1 tablet by mouth 2 (two) times daily.    Review of Systems  Constitutional: Negative.   Respiratory: Negative.   Cardiovascular: Negative.     Social History  Substance Use Topics  . Smoking status: Former Smoker    Years: 10.00  . Smokeless tobacco: Never Used     Comment: quit 05/2006  . Alcohol use No   Objective:   There were no vitals taken for this visit.  Physical Exam      Assessment & Plan:       Follow up: No Follow-up on file.

## 2016-12-03 DIAGNOSIS — F331 Major depressive disorder, recurrent, moderate: Secondary | ICD-10-CM | POA: Diagnosis not present

## 2016-12-03 DIAGNOSIS — F4310 Post-traumatic stress disorder, unspecified: Secondary | ICD-10-CM | POA: Diagnosis not present

## 2016-12-07 DIAGNOSIS — F4310 Post-traumatic stress disorder, unspecified: Secondary | ICD-10-CM | POA: Diagnosis not present

## 2016-12-07 DIAGNOSIS — F331 Major depressive disorder, recurrent, moderate: Secondary | ICD-10-CM | POA: Diagnosis not present

## 2016-12-14 DIAGNOSIS — F331 Major depressive disorder, recurrent, moderate: Secondary | ICD-10-CM | POA: Diagnosis not present

## 2016-12-14 DIAGNOSIS — F4310 Post-traumatic stress disorder, unspecified: Secondary | ICD-10-CM | POA: Diagnosis not present

## 2016-12-16 DIAGNOSIS — F4310 Post-traumatic stress disorder, unspecified: Secondary | ICD-10-CM | POA: Diagnosis not present

## 2016-12-16 DIAGNOSIS — F331 Major depressive disorder, recurrent, moderate: Secondary | ICD-10-CM | POA: Diagnosis not present

## 2016-12-21 ENCOUNTER — Ambulatory Visit
Admission: RE | Admit: 2016-12-21 | Discharge: 2016-12-21 | Disposition: A | Discharge status: Home / Self Care | Payer: Medicare Other | Source: Ambulatory Visit | Attending: Family Medicine | Admitting: Family Medicine

## 2016-12-21 ENCOUNTER — Ambulatory Visit (INDEPENDENT_AMBULATORY_CARE_PROVIDER_SITE_OTHER): Payer: Medicare Other | Admitting: Family Medicine

## 2016-12-21 ENCOUNTER — Encounter: Payer: Self-pay | Admitting: Family Medicine

## 2016-12-21 VITALS — BP 138/82 | HR 87 | Temp 98.9°F | Wt 186.8 lb

## 2016-12-21 DIAGNOSIS — H66001 Acute suppurative otitis media without spontaneous rupture of ear drum, right ear: Secondary | ICD-10-CM | POA: Diagnosis not present

## 2016-12-21 DIAGNOSIS — R05 Cough: Secondary | ICD-10-CM | POA: Diagnosis not present

## 2016-12-21 DIAGNOSIS — F331 Major depressive disorder, recurrent, moderate: Secondary | ICD-10-CM | POA: Diagnosis not present

## 2016-12-21 DIAGNOSIS — I517 Cardiomegaly: Secondary | ICD-10-CM | POA: Diagnosis not present

## 2016-12-21 DIAGNOSIS — R0989 Other specified symptoms and signs involving the circulatory and respiratory systems: Secondary | ICD-10-CM

## 2016-12-21 DIAGNOSIS — F4310 Post-traumatic stress disorder, unspecified: Secondary | ICD-10-CM | POA: Diagnosis not present

## 2016-12-21 DIAGNOSIS — R5381 Other malaise: Secondary | ICD-10-CM | POA: Diagnosis not present

## 2016-12-21 MED ORDER — CEFDINIR 300 MG PO CAPS: 300.0000 mg | ORAL_CAPSULE | Freq: Two times a day (BID) | ORAL | 0 refills | Status: DC

## 2016-12-21 NOTE — Progress Notes (Signed)
Patient: Scott [name] Male    DOB: 1971-01-10   46 y.o.   MRN: [id number] Visit Date: 12/21/2016  Today's Provider: [name], PA   Chief Complaint  Patient presents with  . URI   Subjective:    URI   This is a new problem. Episode onset: Sunday. The problem has been gradually worsening. There has been no fever. Associated symptoms include congestion, coughing, ear pain, joint pain and sinus pain. Associated symptoms comments: Weakness . He has tried acetaminophen (OTC cough suppressant) for the symptoms. The treatment provided mild relief.   Past Medical History:  Diagnosis Date  . Acid reflux   . Anxiety   . Arthritis   . Asthma   . Depression   . Glaucoma   . Hypertension    Past Surgical History:  Procedure Laterality Date  . COLOSTOMY    . DENVER SHUNT PLACEMENT     Family History  Problem Relation Age of Onset  . Multiple sclerosis Mother   . Colon cancer Father   . Heart disease Father   . Lung cancer Maternal Grandmother   . Thyroid cancer Maternal Grandfather   . Ovarian cancer Paternal Grandmother   . Cancer Paternal Grandfather    Allergies  Allergen Reactions  . Codeine      Previous Medications   ANDROGEL PUMP 20.25 MG/ACT (1.62%) GEL       BRINZOLAMIDE (AZOPT) 1 % OPHTHALMIC SUSPENSION    Apply 1 drop to eye daily.   BROMOCRIPTINE (PARLODEL) 2.5 MG TABLET    TK 3 TS PO QAM   DIAZEPAM (VALIUM) 2 MG TABLET    Take 1 tablet by mouth as needed.   IBUPROFEN (ADVIL,MOTRIN) 600 MG TABLET    Take 1 tablet (600 mg total) by mouth every 8 (eight) hours as needed.   LATANOPROST (XALATAN) 0.005 % OPHTHALMIC SOLUTION    1 drop at bedtime.   PAROXETINE (PAXIL) 40 MG TABLET    Take 1 tablet by mouth daily.   QUINAPRIL (ACCUPRIL) 20 MG TABLET    TAKE 1 TABLET BY MOUTH EVERY DAY   SILDENAFIL (REVATIO) 20 MG TABLET    Take 1-5 tablets by mouth. 1 hour before needed. Max 5 tablets per 24 hours.   SULFAMETHOXAZOLE-TRIMETHOPRIM (BACTRIM DS,SEPTRA DS) 800-160 MG  TABLET    Take 1 tablet by mouth 2 (two) times daily.    Review of Systems  Constitutional: Negative.   HENT: Positive for congestion, ear pain and sinus pain.   Respiratory: Positive for cough.   Cardiovascular: Negative.   Musculoskeletal: Positive for joint pain.    Social History  Substance Use Topics  . Smoking status: Former Smoker    Years: 10.00  . Smokeless tobacco: Never Used     Comment: quit 05/2006  . Alcohol use No   Objective:   BP 138/82 (BP Location: Right Arm, Patient Position: Sitting, Cuff Size: Normal)   Pulse 87   Temp 98.9 F (37.2 C) (Oral)   Wt 186 lb 12.8 oz (84.7 kg)   SpO2 96%   BMI 34.17 kg/m   Physical Exam  Constitutional: He is oriented to person, place, and time. He appears well-developed and well-nourished. No distress.  HENT:  Head: Normocephalic and atraumatic.  Right Ear: Hearing normal.  Left Ear: Hearing and external ear normal.  Nose: Nose normal.  Mouth/Throat: Oropharynx is clear and moist.  Slightly red right TM without drainage or perforation.  Eyes: Conjunctivae and lids are normal. Right eye  exhibits no discharge. Left eye exhibits no discharge. No scleral icterus.  Neck: Neck supple.  Cardiovascular: Normal rate and regular rhythm.   Pulmonary/Chest: Effort normal. No respiratory distress.  Questionable rales with minimal wheeze.  Abdominal: Soft. Bowel sounds are normal.  Musculoskeletal: Normal range of motion.  Lymphadenopathy:    He has no cervical adenopathy.  Neurological: He is alert and oriented to person, place, and time.  Skin: Skin is intact. No lesion and no rash noted.  Psychiatric: His speech is normal.      Assessment & Plan:     1. Rales Cough and clear to yellow sputum with malaise and earache over the past 2 days. Questionable rales in posterior bases. Will check chest x-ray and CBC to rule out pneumonia. Start antibiotic and may use Mucinex-DM with Tylenol or Advil prn. Increase fluid intake and  recheck pending reports. - CBC with Differential/Platelet - DG Chest 2 View  2. Malaise General malaise without recent fever over the past couple days. Check CBC for signs of infection or anemia. - CBC with Differential/Platelet  3. Acute suppurative otitis media of right ear without spontaneous rupture of tympanic membrane, recurrence not specified Right earache without drainage onset over the past 2 days. No fever but general malaise. TM is red and hazy. Will treat with antibiotic and check CBC with diff. May use Tylenol or Advil prn discomfort. Recheck prn if symptoms are persistent. - CBC with Differential/Platelet - cefdinir (OMNICEF) 300 MG capsule; Take 1 capsule (300 mg total) by mouth 2 (two) times daily.  Dispense: 20 capsule; Refill: 0

## 2016-12-22 LAB — CBC WITH DIFFERENTIAL/PLATELET
BASOS ABS: 31 {cells}/uL (ref 0–200)
Basophils Relative: 0.3 %
EOS ABS: 156 {cells}/uL (ref 15–500)
EOS PCT: 1.5 %
HCT: 47.9 % (ref 38.5–50.0)
Hemoglobin: 16.1 g/dL (ref 13.2–17.1)
LYMPHS ABS: 1061 {cells}/uL (ref 850–3900)
MCH: 30.4 pg (ref 27.0–33.0)
MCHC: 33.6 g/dL (ref 32.0–36.0)
MCV: 90.5 fL (ref 80.0–100.0)
MPV: 10.5 fL (ref 7.5–12.5)
Monocytes Relative: 12.7 %
NEUTROS ABS: 7831 {cells}/uL — AB (ref 1500–7800)
NEUTROS PCT: 75.3 %
Platelets: 250 10*3/uL (ref 140–400)
RBC: 5.29 10*6/uL (ref 4.20–5.80)
RDW: 12.6 % (ref 11.0–15.0)
Total Lymphocyte: 10.2 %
WBC mixed population: 1321 cells/uL — ABNORMAL HIGH (ref 200–950)
WBC: 10.4 10*3/uL (ref 3.8–10.8)

## 2016-12-23 ENCOUNTER — Telehealth: Payer: Self-pay | Admitting: Family Medicine

## 2016-12-23 NOTE — Telephone Encounter (Signed)
Pt is calling for xray and lab results  [phone]  Thanks teri

## 2016-12-24 DIAGNOSIS — F4310 Post-traumatic stress disorder, unspecified: Secondary | ICD-10-CM | POA: Diagnosis not present

## 2016-12-24 DIAGNOSIS — F331 Major depressive disorder, recurrent, moderate: Secondary | ICD-10-CM | POA: Diagnosis not present

## 2016-12-24 NOTE — Telephone Encounter (Signed)
-----   Message from Margo Common, [place] sent at 12/24/2016  8:39 AM EDT ----- Blood cell counts essentially normal with only slight shift in types of WBC's. Chest x-ray did not show any pneumonia. With congenital disfigurement of spine, difficult to say if heart is a little enlarged. Finish all the antibiotic given and recheck in 10 days if needed.

## 2016-12-24 NOTE — Telephone Encounter (Signed)
See note on blood report.

## 2016-12-24 NOTE — Telephone Encounter (Signed)
Patient advised. He verbalized understanding.  

## 2016-12-29 DIAGNOSIS — F331 Major depressive disorder, recurrent, moderate: Secondary | ICD-10-CM | POA: Diagnosis not present

## 2016-12-29 DIAGNOSIS — F4310 Post-traumatic stress disorder, unspecified: Secondary | ICD-10-CM | POA: Diagnosis not present

## 2017-01-07 DIAGNOSIS — F331 Major depressive disorder, recurrent, moderate: Secondary | ICD-10-CM | POA: Diagnosis not present

## 2017-01-12 DIAGNOSIS — N529 Male erectile dysfunction, unspecified: Secondary | ICD-10-CM | POA: Diagnosis not present

## 2017-01-18 DIAGNOSIS — F331 Major depressive disorder, recurrent, moderate: Secondary | ICD-10-CM | POA: Diagnosis not present

## 2017-01-21 DIAGNOSIS — F331 Major depressive disorder, recurrent, moderate: Secondary | ICD-10-CM | POA: Diagnosis not present

## 2017-01-26 DIAGNOSIS — F331 Major depressive disorder, recurrent, moderate: Secondary | ICD-10-CM | POA: Diagnosis not present

## 2017-01-28 DIAGNOSIS — F331 Major depressive disorder, recurrent, moderate: Secondary | ICD-10-CM | POA: Diagnosis not present

## 2017-02-04 DIAGNOSIS — F331 Major depressive disorder, recurrent, moderate: Secondary | ICD-10-CM | POA: Diagnosis not present

## 2017-02-09 DIAGNOSIS — F331 Major depressive disorder, recurrent, moderate: Secondary | ICD-10-CM | POA: Diagnosis not present

## 2017-02-14 ENCOUNTER — Encounter: Payer: Self-pay | Admitting: Family Medicine

## 2017-02-14 ENCOUNTER — Ambulatory Visit (INDEPENDENT_AMBULATORY_CARE_PROVIDER_SITE_OTHER): Payer: Medicare Other | Admitting: Family Medicine

## 2017-02-14 VITALS — BP 162/100 | HR 89 | Temp 98.8°F | Wt 184.8 lb

## 2017-02-14 DIAGNOSIS — R079 Chest pain, unspecified: Secondary | ICD-10-CM

## 2017-02-14 DIAGNOSIS — Q761 Klippel-Feil syndrome: Secondary | ICD-10-CM | POA: Diagnosis not present

## 2017-02-14 DIAGNOSIS — I10 Essential (primary) hypertension: Secondary | ICD-10-CM

## 2017-02-14 DIAGNOSIS — F418 Other specified anxiety disorders: Secondary | ICD-10-CM

## 2017-02-14 NOTE — Progress Notes (Signed)
Patient: Scott [name] Male    DOB: [birth year]   46 y.o.   MRN: [id number] Visit Date: 02/14/2017  Today's Provider: [name], PA   Chief Complaint  Patient presents with  . Hypertension  . Follow-up   Subjective:    HPI  Hypertension, follow-up:  BP Readings from Last 3 Encounters:  02/14/17 (!) 162/100  12/21/16 138/82  11/16/16 130/88    He was last seen for acute office visit 2 months ago.  BP at that visit was 138/82. Management changes since that visit include no changes. He reports good compliance with treatment.  Outside blood pressures are being checked periodically.Today his BP reading was 170/110.  He is experiencing chest pain, chest pressure/discomfort and dizziness.   Cardiovascular risk factors include dyslipidemia, hypertension, male gender and obesity (BMI >= 30 kg/m2).  Use of agents associated with hypertension: none.     Weight trend: stable Wt Readings from Last 3 Encounters:  02/14/17 184 lb 12.8 oz (83.8 kg)  12/21/16 186 lb 12.8 oz (84.7 kg)  11/16/16 190 lb 6.4 oz (86.4 kg)  Dictation #1 [id number]  [id number]  ------------------------------------------------------------------------  Past Medical History:  Diagnosis Date  . Acid reflux   . Anxiety   . Arthritis   . Asthma   . Depression   . Glaucoma   . Hypertension    Past Surgical History:  Procedure Laterality Date  . COLOSTOMY    . DENVER SHUNT PLACEMENT     Family History  Problem Relation Age of Onset  . Multiple sclerosis Mother   . Colon cancer Father   . Heart disease Father   . Lung cancer Maternal Grandmother   . Thyroid cancer Maternal Grandfather   . Ovarian cancer Paternal Grandmother   . Cancer Paternal Grandfather    Allergies  Allergen Reactions  . Codeine     Current Outpatient Medications:  .  ANDROGEL PUMP 20.25 MG/ACT (1.62%) GEL, , Disp: , Rfl:  .  brinzolamide (AZOPT) 1 % ophthalmic suspension, Apply 1 drop to eye daily., Disp: ,  Rfl:  .  bromocriptine (PARLODEL) 2.5 MG tablet, TK 3 TS PO QAM, Disp: , Rfl: 0 .  cefdinir (OMNICEF) 300 MG capsule, Take 1 capsule (300 mg total) by mouth 2 (two) times daily., Disp: 20 capsule, Rfl: 0 .  diazepam (VALIUM) 2 MG tablet, Take 1 tablet by mouth as needed., Disp: , Rfl:  .  ibuprofen (ADVIL,MOTRIN) 600 MG tablet, Take 1 tablet (600 mg total) by mouth every 8 (eight) hours as needed., Disp: 30 tablet, Rfl: 0 .  latanoprost (XALATAN) 0.005 % ophthalmic solution, 1 drop at bedtime., Disp: , Rfl:  .  PARoxetine (PAXIL) 40 MG tablet, Take 1 tablet by mouth daily., Disp: , Rfl:  .  quinapril (ACCUPRIL) 20 MG tablet, TAKE 1 TABLET BY MOUTH EVERY DAY, Disp: 90 tablet, Rfl: 3 .  sildenafil (REVATIO) 20 MG tablet, Take 1-5 tablets by mouth. 1 hour before needed. Max 5 tablets per 24 hours., Disp: , Rfl: 0 .  sulfamethoxazole-trimethoprim (BACTRIM DS,SEPTRA DS) 800-160 MG tablet, Take 1 tablet by mouth 2 (two) times daily., Disp: 20 tablet, Rfl: 0  Review of Systems  Constitutional: Negative.   Respiratory: Negative.   Cardiovascular: Positive for chest pain.  Neurological: Positive for dizziness.    Social History   Tobacco Use  . Smoking status: Former Smoker    Years: 10.00  . Smokeless tobacco: Never Used  . Tobacco comment: quit 05/2006  Substance Use Topics  . Alcohol use: No    Alcohol/week: 0.0 oz   Objective:   BP (!) 162/100 (BP Location: Right Arm, Patient Position: Sitting, Cuff Size: Normal)   Pulse 89   Temp 98.8 F (37.1 C) (Oral)   Wt 184 lb 12.8 oz (83.8 kg)   SpO2 97%   BMI 33.80 kg/m  Vitals:   02/14/17 1634  BP: (!) 162/100  Pulse: 89  Temp: 98.8 F (37.1 C)  TempSrc: Oral  SpO2: 97%  Weight: 184 lb 12.8 oz (83.8 kg)    Physical Exam  Constitutional: He is oriented to person, place, and time. He appears well-nourished.  HENT:  History of hydrocephalus with shunt left side of head down the left neck to abdomen.  Eyes: Conjunctivae are normal.   Neck: Neck supple.  Cardiovascular: Normal rate.  Pulmonary/Chest: Effort normal. No respiratory distress.  Short stature with irregular thoracic contour. History of scoliosis with large internal metal fixation from upper thoracic to lower lumbar spine.  Abdominal: Soft. Bowel sounds are normal.  Neurological: He is alert and oriented to person, place, and time.  Psychiatric: His mood appears anxious. He is slowed. He exhibits a depressed mood.      Assessment & Plan:     1. Chest pain, unspecified type Chest discomfort with elevated BP today when at a medical appointment with his mother. EKG essentially normal except indications of cardiomegaly (may be due to congenital skeletal anomalies. Also, feels break up with girlfriend causing more stress and anxiety. Will check CBC, Sedrate and Troponin to rule out infection or myocardial injury. May need to go to ER if chest pain worsens or has dyspnea. - EKG 12-Lead - Troponin I - CBC with Differential/Platelet - Sedimentation rate  2. Essential hypertension Elevated BP and using Accupril 20 mg qd (has not taken it today). Stress from break up with girlfriend and caring for mother who has MS probably adding to elevation. Recommend he go home and take one Diazepam 2 mg tablet. Come by the office to get BP recheck by nurse tomorrow.  3. 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 Presently treated with Parlodel and followed by [facility] endocrinologist.  4. Depression with anxiety Still taking the Paxil 40 mg qd. Feeling more anxious and sad since he has ended the relationship with his girlfriend recently. Also, very upset with having to care for his mother who has MS and he gets no help from the rest of the family. Suspect elevation of BP and chest discomfort today is related. Recommend he go home and take one of this Diazepam 2 mg tablets. Recheck if no better tomorrow.       [name], PA  [facility]

## 2017-02-15 ENCOUNTER — Encounter (INDEPENDENT_AMBULATORY_CARE_PROVIDER_SITE_OTHER): Payer: Self-pay

## 2017-02-15 ENCOUNTER — Encounter: Payer: Self-pay | Admitting: Family Medicine

## 2017-02-15 DIAGNOSIS — F331 Major depressive disorder, recurrent, moderate: Secondary | ICD-10-CM | POA: Diagnosis not present

## 2017-02-15 DIAGNOSIS — R079 Chest pain, unspecified: Secondary | ICD-10-CM | POA: Diagnosis not present

## 2017-02-15 NOTE — Progress Notes (Signed)
Patient came in office today for nurse blood pressure check, patient states that he was seen in office yesterday by PCP and had concerns that blood pressure was high since he felt dizzy. Patient reports that he has been under stress being the sole care provider for his mother and recently ending relationship with girlfriend. Patients blood pressure at visit today was 148/96, after sitting down and talking for about ten minutes blood pressure was checked again and was 142/84. KW

## 2017-02-15 NOTE — Progress Notes (Signed)
This is much better BP reading. Proceed with getting labs and take the Diazepam as needed for anxiety once a day for the next 3 days. Recheck pending lab reports.

## 2017-02-16 LAB — CBC WITH DIFFERENTIAL/PLATELET
BASOS PCT: 0.4 %
Basophils Absolute: 34 cells/uL (ref 0–200)
Eosinophils Absolute: 102 cells/uL (ref 15–500)
Eosinophils Relative: 1.2 %
HCT: 48 % (ref 38.5–50.0)
Hemoglobin: 16.2 g/dL (ref 13.2–17.1)
Lymphs Abs: 2304 cells/uL (ref 850–3900)
MCH: 30.3 pg (ref 27.0–33.0)
MCHC: 33.8 g/dL (ref 32.0–36.0)
MCV: 89.9 fL (ref 80.0–100.0)
MONOS PCT: 11.8 %
MPV: 10.5 fL (ref 7.5–12.5)
Neutro Abs: 5058 cells/uL (ref 1500–7800)
Neutrophils Relative %: 59.5 %
PLATELETS: 289 10*3/uL (ref 140–400)
RBC: 5.34 10*6/uL (ref 4.20–5.80)
RDW: 12.6 % (ref 11.0–15.0)
TOTAL LYMPHOCYTE: 27.1 %
WBC mixed population: 1003 cells/uL — ABNORMAL HIGH (ref 200–950)
WBC: 8.5 10*3/uL (ref 3.8–10.8)

## 2017-02-16 LAB — TROPONIN I: Troponin I: 0.02 ng/mL (ref <=0.0)

## 2017-02-16 LAB — SEDIMENTATION RATE: SED RATE: 6 mm/h (ref 0–15)

## 2017-02-16 NOTE — Progress Notes (Signed)
Patient has been advised. KW 

## 2017-02-17 ENCOUNTER — Telehealth: Payer: Self-pay

## 2017-02-17 NOTE — Telephone Encounter (Signed)
Patient advised as directed below. Per patient the chest discomfort is better. If it starts to get worse he will call us back to request the referral with the Cardiologist.  Thanks,  -Jabria Loos

## 2017-02-17 NOTE — Telephone Encounter (Signed)
-----   Message from Margo Common, [place] sent at 02/17/2017  9:26 AM EST ----- No significant changes in blood tests. If chest discomfort persists, will need referral to a cardiologist for possible stress test.

## 2017-02-18 DIAGNOSIS — F331 Major depressive disorder, recurrent, moderate: Secondary | ICD-10-CM | POA: Diagnosis not present

## 2017-02-21 DIAGNOSIS — F331 Major depressive disorder, recurrent, moderate: Secondary | ICD-10-CM | POA: Diagnosis not present

## 2017-03-01 DIAGNOSIS — F331 Major depressive disorder, recurrent, moderate: Secondary | ICD-10-CM | POA: Diagnosis not present

## 2017-03-04 DIAGNOSIS — F331 Major depressive disorder, recurrent, moderate: Secondary | ICD-10-CM | POA: Diagnosis not present

## 2017-03-21 DIAGNOSIS — F331 Major depressive disorder, recurrent, moderate: Secondary | ICD-10-CM | POA: Diagnosis not present

## 2017-03-31 DIAGNOSIS — F331 Major depressive disorder, recurrent, moderate: Secondary | ICD-10-CM | POA: Diagnosis not present

## 2017-04-23 ENCOUNTER — Other Ambulatory Visit: Payer: Self-pay

## 2017-04-23 ENCOUNTER — Emergency Department
Admission: EM | Admit: 2017-04-23 | Discharge: 2017-04-23 | Disposition: A | Discharge status: Home / Self Care | Payer: Medicare Other | Attending: Emergency Medicine | Admitting: Emergency Medicine

## 2017-04-23 DIAGNOSIS — R040 Epistaxis: Secondary | ICD-10-CM | POA: Diagnosis not present

## 2017-04-23 DIAGNOSIS — F329 Major depressive disorder, single episode, unspecified: Secondary | ICD-10-CM | POA: Diagnosis not present

## 2017-04-23 DIAGNOSIS — J45909 Unspecified asthma, uncomplicated: Secondary | ICD-10-CM | POA: Insufficient documentation

## 2017-04-23 DIAGNOSIS — I10 Essential (primary) hypertension: Secondary | ICD-10-CM | POA: Diagnosis not present

## 2017-04-23 DIAGNOSIS — Z87891 Personal history of nicotine dependence: Secondary | ICD-10-CM | POA: Diagnosis not present

## 2017-04-23 DIAGNOSIS — F419 Anxiety disorder, unspecified: Secondary | ICD-10-CM | POA: Insufficient documentation

## 2017-04-23 LAB — CBC WITH DIFFERENTIAL/PLATELET
BASOS ABS: 0.1 10*3/uL (ref 0–0.1)
BASOS PCT: 1 %
Eosinophils Absolute: 0.1 10*3/uL (ref 0–0.7)
Eosinophils Relative: 1 %
HEMATOCRIT: 47.1 % (ref 40.0–52.0)
Hemoglobin: 15.9 g/dL (ref 13.0–18.0)
Lymphocytes Relative: 22 %
Lymphs Abs: 1.7 10*3/uL (ref 1.0–3.6)
MCH: 30.7 pg (ref 26.0–34.0)
MCHC: 33.7 g/dL (ref 32.0–36.0)
MCV: 91 fL (ref 80.0–100.0)
MONO ABS: 0.8 10*3/uL (ref 0.2–1.0)
Monocytes Relative: 9 %
NEUTROS ABS: 5.4 10*3/uL (ref 1.4–6.5)
Neutrophils Relative %: 67 %
PLATELETS: 301 10*3/uL (ref 150–440)
RBC: 5.18 MIL/uL (ref 4.40–5.90)
RDW: 13.7 % (ref 11.5–14.5)
WBC: 8 10*3/uL (ref 3.8–10.6)

## 2017-04-23 LAB — COMPREHENSIVE METABOLIC PANEL
ALBUMIN: 4 g/dL (ref 3.5–5.0)
ALT: 26 U/L (ref 17–63)
AST: 47 U/L — AB (ref 15–41)
Alkaline Phosphatase: 78 U/L (ref 38–126)
Anion gap: 9 (ref 5–15)
BILIRUBIN TOTAL: 0.7 mg/dL (ref 0.3–1.2)
BUN: 18 mg/dL (ref 6–20)
CHLORIDE: 103 mmol/L (ref 101–111)
CO2: 27 mmol/L (ref 22–32)
Calcium: 9.4 mg/dL (ref 8.9–10.3)
Creatinine, Ser: 0.99 mg/dL (ref 0.61–1.24)
GFR calc Af Amer: >60 mL/min (ref >60)
GFR calc non Af Amer: >60 mL/min (ref >60)
GLUCOSE: 165 mg/dL — AB (ref 65–99)
POTASSIUM: 4.7 mmol/L (ref 3.5–5.1)
Sodium: 139 mmol/L (ref 135–145)
Total Protein: 7.6 g/dL (ref 6.5–8.1)

## 2017-04-23 MED ORDER — OXYMETAZOLINE HCL 0.05 % NA SOLN: 2.0000 | Freq: Two times a day (BID) | NASAL | 2 refills | Status: DC

## 2017-04-23 MED ORDER — HYDROCHLOROTHIAZIDE 25 MG PO TABS: 25.0000 mg | ORAL_TABLET | Freq: Every day | ORAL | 1 refills | Status: DC

## 2017-04-23 MED ORDER — LISINOPRIL 10 MG PO TABS
20.0000 mg | ORAL_TABLET | Freq: Every day | ORAL | Status: DC
  Administered 2017-04-23: 20 mg via ORAL
  Filled 2017-04-23: qty 2

## 2017-04-23 MED ORDER — DIAZEPAM 5 MG PO TABS
5.0000 mg | ORAL_TABLET | Freq: Once | ORAL | Status: AC
Start: 2017-04-23 — End: 2017-04-23
  Administered 2017-04-23: 5 mg via ORAL
  Filled 2017-04-23: qty 1

## 2017-04-23 MED ORDER — OXYMETAZOLINE HCL 0.05 % NA SOLN
1.0000 | Freq: Once | NASAL | Status: AC
  Administered 2017-04-23: 1 via NASAL
  Filled 2017-04-23: qty 15

## 2017-04-23 NOTE — ED Provider Notes (Signed)
[facility] Emergency Department Provider Note       Time seen: ----------------------------------------- 7:59 PM on 04/23/2017 -----------------------------------------   I have reviewed the triage vital signs and the nursing notes.  HISTORY   Chief Complaint Epistaxis    HPI Scott [name] is a 47 y.o. male with a history of GERD, anxiety, arthritis, asthma, depression, hypertension who presents to the ED for epistaxis.  Patient arrives by private vehicle for nosebleeds on and off for the past few days.  Patient reports it is coming out of his right nostril.  He was not bleeding on arrival, it does not take any blood thinners.  He denies any recent illness or other complaints.  Past Medical History:  Diagnosis Date  . Acid reflux   . Anxiety   . Arthritis   . Asthma   . Depression   . Glaucoma   . Hypertension     Patient Active Problem List   Diagnosis Date Noted  . Erectile dysfunction 09/09/2016  . Disorder of bursae of shoulder region 09/07/2016  . Impingement syndrome of shoulder region 09/07/2016  . Anxiety 09/11/2014  . Airway hyperreactivity 09/11/2014  . CN (constipation) 09/11/2014  . Alopecia, male pattern 09/11/2014  . Decreased libido 09/11/2014  . Abnormal respiratory rate 09/11/2014  . Dysfunction of eustachian tube 09/11/2014  . Groin strain 09/11/2014  . Cephalalgia 09/11/2014  . Umbilical hernia 29/79/8921  . Essential hypertension 09/11/2014  . Migraine 09/11/2014  . Neoplasm of uncertain behavior 19/41/7408  . Hernia of anterior abdominal wall 09/11/2014  . Contact dermatitis due to Genus Toxicodendron 09/11/2014  . Open-angle glaucoma 05/20/2009  . Congenital hydrocephalus ([place]) 05/20/2009  . 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 Enteritis presumed infectious 06/11/2008  . HLD (hyperlipidemia) 03/15/2008  . Clinical depression 06/13/2007  . Essential (primary) hypertension 06/13/2007    Past Surgical History:   Procedure Laterality Date  . COLOSTOMY    . DENVER SHUNT PLACEMENT      Allergies Codeine  Social History Social History   Tobacco Use  . Smoking status: Former Smoker    Years: 10.00  . Smokeless tobacco: Never Used  . Tobacco comment: quit 05/2006  Substance Use Topics  . Alcohol use: No    Alcohol/week: 0.0 oz  . Drug use: No    Review of Systems Constitutional: Negative for fever. ENT: Positive for epistaxis Cardiovascular: Negative for chest pain. Respiratory: Negative for shortness of breath. Gastrointestinal: Negative for abdominal pain, vomiting and diarrhea. Musculoskeletal: Negative for back pain. Skin: Negative for rash. Neurological: Negative for headaches, focal weakness or numbness.  All systems negative/normal/unremarkable except as stated in the HPI  ____________________________________________   PHYSICAL EXAM:  VITAL SIGNS: ED Triage Vitals  Enc Vitals Group     BP 04/23/17 1857 (!) 188/100     Pulse Rate 04/23/17 1857 (!) 104     Resp 04/23/17 1857 18     Temp 04/23/17 1857 98.7 F (37.1 C)     Temp Source 04/23/17 1857 Oral     SpO2 04/23/17 1857 94 %     Weight 04/23/17 1855 180 lb (81.6 kg)     Height 04/23/17 1855 5\' 2"  (1.575 m)     Head Circumference --      Peak Flow --      Pain Score --      Pain Loc --      Pain Edu? --      Excl. in [place]? --  Constitutional: Alert and oriented. Well appearing and in no distress. Eyes: Conjunctivae are normal. Normal extraocular movements. ENT   Head: Normocephalic and atraumatic.   Nose: No congestion/rhinnorhea.  Positive for recent bleeding from the right naris   Mouth/Throat: Mucous membranes are moist.   Neck: No stridor. Cardiovascular: Normal rate, regular rhythm. No murmurs, rubs, or gallops. Respiratory: Normal respiratory effort without tachypnea nor retractions. Breath sounds are clear and equal bilaterally. No wheezes/rales/rhonchi. Musculoskeletal: Nontender  with normal range of motion in extremities. No lower extremity tenderness nor edema. Neurologic:  Normal speech and language. No gross focal neurologic deficits are appreciated.  Skin:  Skin is warm, dry and intact. No rash noted. Psychiatric: Mood and affect are normal. Speech and behavior are normal.  ____________________________________________  ED COURSE:  As part of my medical decision making, I reviewed the following data within the [place] History obtained from family if available, nursing notes, old chart and ekg, as well as notes from prior ED visits. Patient presented for epistaxis, we will assess with labs and imaging as indicated at this time.  Patient will receive nasal spray and we will give his nightly blood pressure medication.   Procedures ____________________________________________   LABS (pertinent positives/negatives)  Labs Reviewed  COMPREHENSIVE METABOLIC PANEL - Abnormal; Notable for the following components:      Result Value   Glucose, Bld 165 (*)    AST 47 (*)    All other components within normal limits  CBC WITH DIFFERENTIAL/PLATELET   ____________________________________________  DIFFERENTIAL DIAGNOSIS   Epistaxis, hypertensive urgency, medication noncompliance, electrolyte abnormality, anemia  FINAL ASSESSMENT AND PLAN  Epistaxis, hypertension   Plan: Patient had presented for epistaxis and hypertension. Patient's labs did not reveal any acute process.  His blood pressure has improved to 161/84.  We did give nasal spray to both nostrils.  He will continue home with similar treatment.  He is stable for outpatient follow-up.    Earleen Newport, MD   Note: This note was generated in part or whole with voice recognition software. Voice recognition is usually quite accurate but there are transcription errors that can and very often do occur. I apologize for any typographical errors that were not detected and corrected.      Earleen Newport, MD 04/23/17 2114

## 2017-04-23 NOTE — ED Notes (Signed)
Pt states he has been having nose bleeds which he is associating with his high blood pressure. States awakes with nose bleed, checks pressure and it is high. Currently 180/114. On bp meds.. C/o headache

## 2017-04-23 NOTE — ED Triage Notes (Signed)
Pt came to ED via pov c/o nosebleeds on and off for past few days. Coming out of right nostril. Not currently bleeding in triage. Not on blood thinners.

## 2017-04-25 ENCOUNTER — Encounter: Payer: Self-pay | Admitting: Family Medicine

## 2017-04-25 ENCOUNTER — Ambulatory Visit (INDEPENDENT_AMBULATORY_CARE_PROVIDER_SITE_OTHER): Payer: Medicare Other | Admitting: Family Medicine

## 2017-04-25 VITALS — BP 142/78 | HR 82 | Temp 98.3°F | Wt 185.6 lb

## 2017-04-25 DIAGNOSIS — R040 Epistaxis: Secondary | ICD-10-CM

## 2017-04-25 DIAGNOSIS — I10 Essential (primary) hypertension: Secondary | ICD-10-CM

## 2017-04-25 NOTE — Progress Notes (Signed)
Patient: Scott [name] Male    DOB: 10/06/1970   47 y.o.   MRN: [id number] Visit Date: 04/25/2017  Today's Provider: [name], PA   Chief Complaint  Patient presents with  . ER Follow Up   Subjective:    HPI  Follow up ER visit  Patient was seen in ER for intermittent nose bleeds for a couple days on 04/23/2017. He was treated for Epistaxis and HTN (BP was 188/100 at ER). Treatment for this included started HCTZ 25 mg to start after 1 week if BP was still elevated and started Oxymetazoline 0.05%- 2 sprays BID He reports good compliance with treatment. Patient states he is experiencing headaches at this time. He thinks he may need to start HCTZ 25 mg at this time instead of waiting a week. He reports this condition is improved . Patient denies epistaxis since 04/23/17  ------------------------------------------------------------------------------------ BP Readings from Last 3 Encounters:  04/25/17 (!) 142/78  04/23/17 (!) 150/88  02/15/17 (!) 148/96   Past Medical History:  Diagnosis Date  . Acid reflux   . Anxiety   . Arthritis   . Asthma   . Depression   . Glaucoma   . Hypertension    Past Surgical History:  Procedure Laterality Date  . COLOSTOMY    . DENVER SHUNT PLACEMENT     Family History  Problem Relation Age of Onset  . Multiple sclerosis Mother   . Colon cancer Father   . Heart disease Father   . Lung cancer Maternal Grandmother   . Thyroid cancer Maternal Grandfather   . Ovarian cancer Paternal Grandmother   . Cancer Paternal Grandfather    Allergies  Allergen Reactions  . Codeine     Current Outpatient Medications:  .  ANDROGEL PUMP 20.25 MG/ACT (1.62%) GEL, , Disp: , Rfl:  .  brinzolamide (AZOPT) 1 % ophthalmic suspension, Apply 1 drop to eye daily., Disp: , Rfl:  .  bromocriptine (PARLODEL) 2.5 MG tablet, TK 3 TS PO QAM, Disp: , Rfl: 0 .  diazepam (VALIUM) 2 MG tablet, Take 1 tablet by mouth as needed., Disp: , Rfl:  .   hydrochlorothiazide (HYDRODIURIL) 25 MG tablet, Take 1 tablet (25 mg total) by mouth daily. Start after 1 week if your blood pressure is still elevated, Disp: 30 tablet, Rfl: 1 .  ibuprofen (ADVIL,MOTRIN) 600 MG tablet, Take 1 tablet (600 mg total) by mouth every 8 (eight) hours as needed., Disp: 30 tablet, Rfl: 0 .  latanoprost (XALATAN) 0.005 % ophthalmic solution, 1 drop at bedtime., Disp: , Rfl:  .  oxymetazoline (AFRIN) 0.05 % nasal spray, Place 2 sprays into both nostrils 2 (two) times daily., Disp: 15 mL, Rfl: 2 .  PARoxetine (PAXIL) 40 MG tablet, Take 1 tablet by mouth daily., Disp: , Rfl:  .  quinapril (ACCUPRIL) 20 MG tablet, TAKE 1 TABLET BY MOUTH EVERY DAY, Disp: 90 tablet, Rfl: 3 .  sildenafil (REVATIO) 20 MG tablet, Take 1-5 tablets by mouth. 1 hour before needed. Max 5 tablets per 24 hours., Disp: , Rfl: 0  Review of Systems  Constitutional: Negative.   Respiratory: Negative.   Cardiovascular: Negative.   Neurological: Positive for headaches.    Social History   Tobacco Use  . Smoking status: Former Smoker    Years: 10.00  . Smokeless tobacco: Never Used  . Tobacco comment: quit 05/2006  Substance Use Topics  . Alcohol use: No    Alcohol/week: 0.0 oz   Objective:  BP (!) 142/78 (BP Location: Right Arm, Patient Position: Sitting, Cuff Size: Normal)   Pulse 82   Temp 98.3 F (36.8 C) (Oral)   Wt 185 lb 9.6 oz (84.2 kg)   SpO2 98%   BMI 33.95 kg/m    Physical Exam  Constitutional: He is oriented to person, place, and time. He appears well-developed and well-nourished. No distress.  HENT:  Head: Normocephalic and atraumatic.  Right Ear: Hearing normal.  Left Ear: Hearing normal.  Nose: Nose normal.  Eyes: Conjunctivae and lids are normal. Right eye exhibits no discharge. Left eye exhibits no discharge. No scleral icterus.  Cardiovascular: Normal rate and regular rhythm.  Pulmonary/Chest: Effort normal and breath sounds normal. No respiratory distress.    Abdominal: Soft. Bowel sounds are normal.  Neurological: He is alert and oriented to person, place, and time.  Skin: Skin is intact. No lesion and no rash noted.  Psychiatric: He has a normal mood and affect. His speech is normal and behavior is normal. Thought content normal.      Assessment & Plan:     1. Epistaxis Onset 04-23-17 and went to ER with elevation of BP to 188/100. Dropped to 161/84 before leaving the ER and given Afrin to use BID to constrict blood vessels in the nose and stop bleeding. May stop Afrin if no further bleeding in 3-4 days.  2. Essential hypertension BP better today. Down to 130/78 RA after sitting down in the exam room for 15 minutes. Some worry about mother who has been moved to [place] recently. Continue Accupril 20 mg qd and has not taken the HCTZ at home. May add 12.5 mg of the HCTZ if BP above the 140/90 at home. Recheck if no better in a month.       [name], PA  [facility]

## 2017-04-28 DIAGNOSIS — E221 Hyperprolactinemia: Secondary | ICD-10-CM | POA: Diagnosis not present

## 2017-04-28 DIAGNOSIS — E291 Testicular hypofunction: Secondary | ICD-10-CM | POA: Diagnosis not present

## 2017-04-28 DIAGNOSIS — D352 Benign neoplasm of pituitary gland: Secondary | ICD-10-CM | POA: Diagnosis not present

## 2017-05-05 DIAGNOSIS — Q761 Klippel-Feil syndrome: Secondary | ICD-10-CM | POA: Diagnosis not present

## 2017-05-05 DIAGNOSIS — D352 Benign neoplasm of pituitary gland: Secondary | ICD-10-CM | POA: Diagnosis not present

## 2017-05-05 DIAGNOSIS — E291 Testicular hypofunction: Secondary | ICD-10-CM | POA: Diagnosis not present

## 2017-05-05 DIAGNOSIS — E785 Hyperlipidemia, unspecified: Secondary | ICD-10-CM | POA: Diagnosis not present

## 2017-05-05 DIAGNOSIS — F4322 Adjustment disorder with anxiety: Secondary | ICD-10-CM | POA: Diagnosis not present

## 2017-05-05 DIAGNOSIS — I10 Essential (primary) hypertension: Secondary | ICD-10-CM | POA: Diagnosis not present

## 2017-05-05 DIAGNOSIS — E221 Hyperprolactinemia: Secondary | ICD-10-CM | POA: Diagnosis not present

## 2017-05-05 DIAGNOSIS — F5101 Primary insomnia: Secondary | ICD-10-CM | POA: Diagnosis not present

## 2017-05-05 DIAGNOSIS — Q540 Hypospadias, balanic: Secondary | ICD-10-CM | POA: Diagnosis not present

## 2017-06-15 DIAGNOSIS — F410 Panic disorder [episodic paroxysmal anxiety] without agoraphobia: Secondary | ICD-10-CM | POA: Diagnosis not present

## 2017-06-15 DIAGNOSIS — Z79899 Other long term (current) drug therapy: Secondary | ICD-10-CM | POA: Diagnosis not present

## 2017-06-21 DIAGNOSIS — F410 Panic disorder [episodic paroxysmal anxiety] without agoraphobia: Secondary | ICD-10-CM | POA: Diagnosis not present

## 2017-06-21 DIAGNOSIS — Z79899 Other long term (current) drug therapy: Secondary | ICD-10-CM | POA: Diagnosis not present

## 2017-07-11 ENCOUNTER — Telehealth: Payer: Self-pay | Admitting: Family Medicine

## 2017-07-11 NOTE — Telephone Encounter (Signed)
Faxed Medical Records on 9.12.18 to [employer] [phone]

## 2017-08-17 DIAGNOSIS — Z79899 Other long term (current) drug therapy: Secondary | ICD-10-CM | POA: Diagnosis not present

## 2017-08-17 DIAGNOSIS — F410 Panic disorder [episodic paroxysmal anxiety] without agoraphobia: Secondary | ICD-10-CM | POA: Diagnosis not present

## 2017-09-04 ENCOUNTER — Other Ambulatory Visit: Payer: Self-pay | Admitting: Family Medicine

## 2017-09-13 DIAGNOSIS — Z79899 Other long term (current) drug therapy: Secondary | ICD-10-CM | POA: Diagnosis not present

## 2017-09-13 DIAGNOSIS — F3130 Bipolar disorder, current episode depressed, mild or moderate severity, unspecified: Secondary | ICD-10-CM | POA: Diagnosis not present

## 2017-09-20 DIAGNOSIS — Z79899 Other long term (current) drug therapy: Secondary | ICD-10-CM | POA: Diagnosis not present

## 2017-09-20 DIAGNOSIS — F3130 Bipolar disorder, current episode depressed, mild or moderate severity, unspecified: Secondary | ICD-10-CM | POA: Diagnosis not present

## 2017-09-27 DIAGNOSIS — F3130 Bipolar disorder, current episode depressed, mild or moderate severity, unspecified: Secondary | ICD-10-CM | POA: Diagnosis not present

## 2017-09-27 DIAGNOSIS — Z79899 Other long term (current) drug therapy: Secondary | ICD-10-CM | POA: Diagnosis not present

## 2017-09-29 ENCOUNTER — Other Ambulatory Visit: Payer: Self-pay | Admitting: Family Medicine

## 2017-09-29 ENCOUNTER — Ambulatory Visit (INDEPENDENT_AMBULATORY_CARE_PROVIDER_SITE_OTHER): Payer: Medicare Other | Admitting: Family Medicine

## 2017-09-29 ENCOUNTER — Encounter: Payer: Self-pay | Admitting: Family Medicine

## 2017-09-29 VITALS — BP 122/74 | HR 95 | Temp 98.3°F | Wt 189.8 lb

## 2017-09-29 DIAGNOSIS — F418 Other specified anxiety disorders: Secondary | ICD-10-CM

## 2017-09-29 DIAGNOSIS — E291 Testicular hypofunction: Secondary | ICD-10-CM

## 2017-09-29 DIAGNOSIS — I10 Essential (primary) hypertension: Secondary | ICD-10-CM

## 2017-09-29 MED ORDER — PAROXETINE HCL 40 MG PO TABS: 40.0000 mg | ORAL_TABLET | Freq: Every day | ORAL | 0 refills | Status: DC

## 2017-09-29 NOTE — Progress Notes (Signed)
Patient: Scott [name] Male    DOB: 1971-01-13   47 y.o.   MRN: [id number] Visit Date: 09/29/2017  Today's Provider: [name], PA   Chief Complaint  Patient presents with  . Anxiety  . Depression   Subjective:    HPI Patient presents today to request a refill on Paxil 40 mg tablet. Patient states that Dr. Rosine Door in [place] normally prescribes medication, but has not approved refill with pharmacy yet. Patient states he has been out of medication the past 2 days. He states he contact the office and they stated Dr. Rosine Door was behind on refills.     Past Medical History:  Diagnosis Date  . Acid reflux   . Anxiety   . Arthritis   . Asthma   . Depression   . Glaucoma   . Hypertension    Past Surgical History:  Procedure Laterality Date  . COLOSTOMY    . DENVER SHUNT PLACEMENT     Family History  Problem Relation Age of Onset  . Multiple sclerosis Mother   . Colon cancer Father   . Heart disease Father   . Lung cancer Maternal Grandmother   . Thyroid cancer Maternal Grandfather   . Ovarian cancer Paternal Grandmother   . Cancer Paternal Grandfather    Allergies  Allergen Reactions  . Codeine     Current Outpatient Medications:  .  ANDROGEL PUMP 20.25 MG/ACT (1.62%) GEL, , Disp: , Rfl:  .  brinzolamide (AZOPT) 1 % ophthalmic suspension, Apply 1 drop to eye daily., Disp: , Rfl:  .  bromocriptine (PARLODEL) 2.5 MG tablet, TK 3 TS PO QAM, Disp: , Rfl: 0 .  diazepam (VALIUM) 2 MG tablet, Take 1 tablet by mouth as needed., Disp: , Rfl:  .  hydrochlorothiazide (HYDRODIURIL) 25 MG tablet, Take 1 tablet (25 mg total) by mouth daily. Start after 1 week if your blood pressure is still elevated, Disp: 30 tablet, Rfl: 1 .  ibuprofen (ADVIL,MOTRIN) 600 MG tablet, Take 1 tablet (600 mg total) by mouth every 8 (eight) hours as needed., Disp: 30 tablet, Rfl: 0 .  latanoprost (XALATAN) 0.005 % ophthalmic solution, 1 drop at bedtime., Disp: , Rfl:  .  oxymetazoline (AFRIN)  0.05 % nasal spray, Place 2 sprays into both nostrils 2 (two) times daily., Disp: 15 mL, Rfl: 2 .  PARoxetine (PAXIL) 40 MG tablet, Take 1 tablet by mouth daily., Disp: , Rfl:  .  quinapril (ACCUPRIL) 20 MG tablet, TAKE 1 TABLET BY MOUTH EVERY DAY, Disp: 90 tablet, Rfl: 0 .  sildenafil (REVATIO) 20 MG tablet, Take 1-5 tablets by mouth. 1 hour before needed. Max 5 tablets per 24 hours., Disp: , Rfl: 0  Review of Systems  Constitutional: Negative.   HENT: Positive for sore throat.   Respiratory: Negative.   Cardiovascular: Negative.    Social History   Tobacco Use  . Smoking status: Former Smoker    Years: 10.00  . Smokeless tobacco: Never Used  . Tobacco comment: quit 05/2006  Substance Use Topics  . Alcohol use: No    Alcohol/week: 0.0 oz   Objective:   BP 122/74 (BP Location: Right Arm, Patient Position: Sitting, Cuff Size: Normal)   Pulse 95   Temp 98.3 F (36.8 C) (Oral)   Wt 189 lb 12.8 oz (86.1 kg)   SpO2 97%   BMI 34.71 kg/m   Physical Exam  Constitutional: He is oriented to person, place, and time. He appears well-developed and well-nourished.  No distress.  HENT:  Head: Normocephalic and atraumatic.  Right Ear: Hearing normal.  Left Ear: Hearing normal.  Nose: Nose normal.  Eyes: Conjunctivae and lids are normal. Right eye exhibits no discharge. Left eye exhibits no discharge. No scleral icterus.  Cardiovascular: Normal rate and regular rhythm.  Pulmonary/Chest: Effort normal and breath sounds normal. No respiratory distress.  Abdominal: Soft. Bowel sounds are normal.  Neurological: He is alert and oriented to person, place, and time.  Skin: Skin is intact. No lesion and no rash noted.  Psychiatric: His speech is normal and behavior is normal. Thought content normal. His mood appears anxious. He exhibits a depressed mood.      Assessment & Plan:     1. Depression with anxiety Dr. Rosine Door (psychiatrist) has been treating him for depression with anxiety disorder.  Has follow up with him every 6 months. Was unable to get a refill from his office and totally ran out of the Paxil. Requests refill through this office to cover him until Dr. Rosine Door can get the refills he needs. Recheck prn. - PARoxetine (PAXIL) 40 MG tablet; Take 1 tablet (40 mg total) by mouth daily.  Dispense: 30 tablet; Refill: 0  2. Essential (primary) hypertension BP well controlled. Tolerating Accupril 20 mg qd and HCTZ 25 mg qd. Dr. Ronnald Collum (endocrinologist) planning follow up labs in the next month. No chest pains, dyspnea, palpitations or significant edema.  3. Hypogonadism in male Followed by Dr. Ronnald Collum (endocrinologist) and using Parlodel with Androgel. History of congenital anomalies (skeletal deformities, hypospadias, hydrocephalus).       [name], PA  [facility]

## 2017-10-04 DIAGNOSIS — F3130 Bipolar disorder, current episode depressed, mild or moderate severity, unspecified: Secondary | ICD-10-CM | POA: Diagnosis not present

## 2017-10-04 DIAGNOSIS — Z79899 Other long term (current) drug therapy: Secondary | ICD-10-CM | POA: Diagnosis not present

## 2017-10-13 ENCOUNTER — Ambulatory Visit: Payer: Self-pay | Admitting: Family Medicine

## 2017-10-21 ENCOUNTER — Encounter: Payer: Self-pay | Admitting: Family Medicine

## 2017-10-21 ENCOUNTER — Ambulatory Visit
Admission: RE | Admit: 2017-10-21 | Discharge: 2017-10-21 | Disposition: A | Discharge status: Home / Self Care | Payer: Medicare Other | Source: Ambulatory Visit | Attending: Family Medicine | Admitting: Family Medicine

## 2017-10-21 ENCOUNTER — Ambulatory Visit (INDEPENDENT_AMBULATORY_CARE_PROVIDER_SITE_OTHER): Payer: Medicare Other | Admitting: Family Medicine

## 2017-10-21 VITALS — BP 146/86 | HR 89 | Temp 98.3°F | Resp 16 | Wt 191.4 lb

## 2017-10-21 DIAGNOSIS — S8992XA Unspecified injury of left lower leg, initial encounter: Secondary | ICD-10-CM | POA: Insufficient documentation

## 2017-10-21 DIAGNOSIS — W19XXXA Unspecified fall, initial encounter: Secondary | ICD-10-CM | POA: Insufficient documentation

## 2017-10-21 DIAGNOSIS — M7989 Other specified soft tissue disorders: Secondary | ICD-10-CM | POA: Insufficient documentation

## 2017-10-21 DIAGNOSIS — R937 Abnormal findings on diagnostic imaging of other parts of musculoskeletal system: Secondary | ICD-10-CM | POA: Diagnosis not present

## 2017-10-21 MED ORDER — HYDROCODONE-ACETAMINOPHEN 5-325 MG PO TABS
1.0000 | ORAL_TABLET | Freq: Four times a day (QID) | ORAL | 0 refills | Status: AC | PRN
Start: 2017-10-21 — End: 2017-10-26

## 2017-10-21 NOTE — Patient Instructions (Signed)
Continue icing for 20 minutes several x day and continue aspirin as you are doing. We will call you with the x-ray report. Clean the abrasion daily with soap and water and apply a bandaid.

## 2017-10-21 NOTE — Progress Notes (Signed)
  Subjective:     Patient ID: Scott [name], male   DOB: Sep 11, 1970, 47 y.o.   MRN: [id number] Chief Complaint  Patient presents with  . Fall    Patient comes in office today with complaints of left knee pain after falling down stairs yesterday at his friends house. Patient states that knee was red and swollen and complains of pain when walking and bending. Patient has tried alternating between ice and heat and taking otc Aspirin.    HPI Has been taking 3 aspirin every 4 hours for pain. No prior history of knee injury or surgery. States he fell directly on to brick and gravel after being tripped by a dog. Accompanied by his girl friend, [name].  Review of Systems     Objective:   Physical Exam  Constitutional: He appears well-developed and well-nourished. He appears distressed (antalgic gait and difficulty getting up on exam table.).  Musculoskeletal:  Left patella with contusion, swelling, tenderness and abrasion       Assessment:    1. Injury of left knee, initial encounter: rx for hydrocodone - DG Knee Complete 4 Views Left; Future    Plan:    Continue ASA and icing. Cleanse abrasion daily and apply band-aid. Further f/u pending x-ray.

## 2017-10-24 ENCOUNTER — Ambulatory Visit
Admission: RE | Admit: 2017-10-24 | Discharge: 2017-10-24 | Disposition: A | Discharge status: Home / Self Care | Payer: Medicare Other | Source: Ambulatory Visit | Attending: Family Medicine | Admitting: Family Medicine

## 2017-10-24 ENCOUNTER — Other Ambulatory Visit: Payer: Self-pay | Admitting: Family Medicine

## 2017-10-24 ENCOUNTER — Telehealth: Payer: Self-pay | Admitting: Family Medicine

## 2017-10-24 DIAGNOSIS — L988 Other specified disorders of the skin and subcutaneous tissue: Secondary | ICD-10-CM | POA: Insufficient documentation

## 2017-10-24 DIAGNOSIS — M79652 Pain in left thigh: Secondary | ICD-10-CM | POA: Diagnosis not present

## 2017-10-24 DIAGNOSIS — Q742 Other congenital malformations of lower limb(s), including pelvic girdle: Secondary | ICD-10-CM | POA: Diagnosis present

## 2017-10-24 NOTE — Telephone Encounter (Signed)
Please review. KW 

## 2017-10-24 NOTE — Telephone Encounter (Signed)
Discussed x-ray report. Will get additional views before deciding on further referral.

## 2017-10-24 NOTE — Telephone Encounter (Signed)
Pt stated he was returning a call from our office. Pt also stated that he would like to move forward with the ortho referral for his knee pain that he saw Mikki Santee for on 10/21/17. Please advise. Thanks TNP

## 2017-10-26 DIAGNOSIS — F3130 Bipolar disorder, current episode depressed, mild or moderate severity, unspecified: Secondary | ICD-10-CM | POA: Diagnosis not present

## 2017-10-26 DIAGNOSIS — Z79899 Other long term (current) drug therapy: Secondary | ICD-10-CM | POA: Diagnosis not present

## 2017-10-31 DIAGNOSIS — N529 Male erectile dysfunction, unspecified: Secondary | ICD-10-CM | POA: Diagnosis not present

## 2017-11-02 ENCOUNTER — Ambulatory Visit
Admission: RE | Admit: 2017-11-02 | Discharge: 2017-11-02 | Disposition: A | Discharge status: Home / Self Care | Payer: Medicare Other | Source: Ambulatory Visit | Attending: Family Medicine | Admitting: Family Medicine

## 2017-11-02 ENCOUNTER — Encounter: Payer: Self-pay | Admitting: Family Medicine

## 2017-11-02 ENCOUNTER — Ambulatory Visit (INDEPENDENT_AMBULATORY_CARE_PROVIDER_SITE_OTHER): Payer: Medicare Other | Admitting: Family Medicine

## 2017-11-02 VITALS — BP 160/90 | HR 86 | Temp 98.3°F | Resp 16 | Wt 195.4 lb

## 2017-11-02 DIAGNOSIS — M79672 Pain in left foot: Secondary | ICD-10-CM | POA: Diagnosis not present

## 2017-11-02 DIAGNOSIS — M25572 Pain in left ankle and joints of left foot: Secondary | ICD-10-CM | POA: Insufficient documentation

## 2017-11-02 DIAGNOSIS — M7989 Other specified soft tissue disorders: Secondary | ICD-10-CM

## 2017-11-02 DIAGNOSIS — W19XXXA Unspecified fall, initial encounter: Secondary | ICD-10-CM | POA: Insufficient documentation

## 2017-11-02 DIAGNOSIS — S99912A Unspecified injury of left ankle, initial encounter: Secondary | ICD-10-CM | POA: Diagnosis not present

## 2017-11-02 NOTE — Patient Instructions (Signed)
Ice and elevate your left foot and ankle pending x-ray results.

## 2017-11-02 NOTE — Progress Notes (Signed)
  Subjective:     Patient ID: Scott [name], male   DOB: [birth year], 47 y.o.   MRN: [id number] Chief Complaint  Patient presents with  . Foot Pain    Patient comes in office today with concerns of left foot pain and swelling that started in 10/22/17, patient states that on 7/18 he had falled down stairs and evaluated in office on 7/19. Patient reports swelling of entire foot and bruising on his toes and side of foot. Patient reports pain when walking or bearing weight   HPI States knee has improved.Left foot and ankle have gotten more swollen and tender with bruising on the toes and side of his foot.  Review of Systems     Objective:   Physical Exam  Constitutional: He appears well-developed and well-nourished. No distress.  Musculoskeletal:  Left foot with 2+ edema and tenderness on the proximal aspect as well as about his medial and lateral ankle. Ankle ligaments stable.       Assessment:    1. Swelling of left foot - DG Foot Complete Left; Future  2. Acute left ankle pain - DG Ankle Complete Left; Future    Plan:    Discussed ice and elevation. Further f/u pending x-ray report.

## 2017-11-03 ENCOUNTER — Telehealth: Payer: Self-pay

## 2017-11-03 DIAGNOSIS — F331 Major depressive disorder, recurrent, moderate: Secondary | ICD-10-CM | POA: Diagnosis not present

## 2017-11-03 NOTE — Telephone Encounter (Signed)
Patient has been advised. KW 

## 2017-11-03 NOTE — Telephone Encounter (Signed)
-----   Message from Carmon Ginsberg, [place] sent at 11/03/2017  7:28 AM EDT ----- No fracture-continue elevation and aspirin (take with food 3 x day)

## 2017-11-03 NOTE — Telephone Encounter (Signed)
-----   Message from Carmon Ginsberg, [place] sent at 11/03/2017  7:28 AM EDT ----- No ankle fracture (see foot x-ray)-continue elevation and aspirin as needed.

## 2017-11-18 DIAGNOSIS — F3131 Bipolar disorder, current episode depressed, mild: Secondary | ICD-10-CM | POA: Diagnosis not present

## 2017-11-30 DIAGNOSIS — F3341 Major depressive disorder, recurrent, in partial remission: Secondary | ICD-10-CM | POA: Diagnosis not present

## 2017-11-30 DIAGNOSIS — Z79899 Other long term (current) drug therapy: Secondary | ICD-10-CM | POA: Diagnosis not present

## 2017-12-01 ENCOUNTER — Other Ambulatory Visit: Payer: Self-pay | Admitting: Family Medicine

## 2017-12-06 DIAGNOSIS — F331 Major depressive disorder, recurrent, moderate: Secondary | ICD-10-CM | POA: Diagnosis not present

## 2018-01-07 DIAGNOSIS — F331 Major depressive disorder, recurrent, moderate: Secondary | ICD-10-CM | POA: Diagnosis not present

## 2018-01-11 ENCOUNTER — Encounter: Payer: Self-pay | Admitting: Family Medicine

## 2018-01-11 ENCOUNTER — Other Ambulatory Visit: Payer: Self-pay

## 2018-01-11 ENCOUNTER — Ambulatory Visit (INDEPENDENT_AMBULATORY_CARE_PROVIDER_SITE_OTHER): Payer: Medicare Other | Admitting: Family Medicine

## 2018-01-11 VITALS — BP 162/88 | HR 104 | Temp 98.1°F | Ht 62.0 in | Wt 195.4 lb

## 2018-01-11 DIAGNOSIS — R0600 Dyspnea, unspecified: Secondary | ICD-10-CM | POA: Diagnosis not present

## 2018-01-11 MED ORDER — ALBUTEROL SULFATE HFA 108 (90 BASE) MCG/ACT IN AERS: 2.0000 | INHALATION_SPRAY | RESPIRATORY_TRACT | 2 refills | Status: DC | PRN

## 2018-01-11 NOTE — Progress Notes (Signed)
  Subjective:     Patient ID: Scott [name], male   DOB: [birth year], 47 y.o.   MRN: [id number] Chief Complaint  Patient presents with  . Shortness of Breath    chest pain, cough, wheezing and congestion allergy related.  pt reports the symptoms for the last 2-3 weeks but has worsen in the last few days 01/05/18   HPI Denies cold sx or use of respiratory medication.Occasional cough with production of clear sputum. Reports "restrictive lung disease" from childhood with baseline mild DOE. States it has gotten worse and he feels he is "drowning" at times. 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 Accompanied by his s.o., Scott [name].  Review of Systems  Cardiovascular: Negative for chest pain ( retrosternal).       Objective:   Physical Exam  Constitutional: He appears well-developed and well-nourished. No distress.  Cardiovascular: Normal rate and regular rhythm.  Pulmonary/Chest: Breath sounds normal.  Musculoskeletal:       Right lower leg: He exhibits no edema.       Left lower leg: He exhibits no edema.       Assessment:    1. Dyspnea, unspecified type: start albuterol MDI - Ambulatory referral to Pulmonology    Plan:    Patient defers steroid preparations. Phone f/u in the next 24 hours.

## 2018-01-11 NOTE — Patient Instructions (Addendum)
Phone follow up in the next day or two. We will call you with the pulmonary referral.

## 2018-01-12 DIAGNOSIS — H18831 Recurrent erosion of cornea, right eye: Secondary | ICD-10-CM | POA: Diagnosis not present

## 2018-01-21 DIAGNOSIS — F331 Major depressive disorder, recurrent, moderate: Secondary | ICD-10-CM | POA: Diagnosis not present

## 2018-01-25 ENCOUNTER — Encounter: Payer: Self-pay | Admitting: Pulmonary Disease

## 2018-01-25 ENCOUNTER — Ambulatory Visit (INDEPENDENT_AMBULATORY_CARE_PROVIDER_SITE_OTHER): Payer: Medicare Other | Admitting: Pulmonary Disease

## 2018-01-25 VITALS — BP 158/100 | HR 99 | Ht 59.75 in | Wt 196.2 lb

## 2018-01-25 DIAGNOSIS — M419 Scoliosis, unspecified: Secondary | ICD-10-CM | POA: Diagnosis not present

## 2018-01-25 DIAGNOSIS — R0600 Dyspnea, unspecified: Secondary | ICD-10-CM | POA: Diagnosis not present

## 2018-01-25 DIAGNOSIS — G4730 Sleep apnea, unspecified: Secondary | ICD-10-CM

## 2018-01-25 DIAGNOSIS — J984 Other disorders of lung: Secondary | ICD-10-CM | POA: Diagnosis not present

## 2018-01-25 DIAGNOSIS — I10 Essential (primary) hypertension: Secondary | ICD-10-CM

## 2018-01-25 DIAGNOSIS — Q761 Klippel-Feil syndrome: Secondary | ICD-10-CM | POA: Diagnosis not present

## 2018-01-25 NOTE — Progress Notes (Signed)
Subjective:    Patient ID: Scott [name], male    DOB: 02-May-1970, 47 y.o.   MRN: [id number]  HPI The patient is a 47 year old lifelong never smoker, with Klippel-Fiel syndrome who presents here for evaluation and management of dyspnea, he is kindly referred by Dr. Natale Milch. The patient states that he has been having worsening dyspnea over the last six months. Initially, he attributed this to having to take care of his sick mother. He states that he neglected his exercise regimen during this time. He states that the dyspnea is worse when he does more strenuous walking, long distances and with stress. He has had to sleep in a recliner sitting up because he cannot lay down due to dyspnea. When he lays back he has been told he snores. He has not had any fevers, chills or sweats. He has noted some mild increased lower extremity edema. He states that he has an albuterol inhaler that sometimes makes him feel better sometimes not. He has had some vague chest pain and occasional tachypalpitations. As noted above he has Klippel-Fiel syndrome and has severe Sprengel's deformity consist of very severe kyphosis coleus is. The patient has had prior placement of fine stabilizing hardware (Harrington rods).  Patient does not have any pets in the home. He is disabled and not employed. He has no unusual hobbies. There are no hot tubs in the home. He has not had any exposure to tuberculosis. He has resided in [place] previously but no other places in the country. No travel outside of the country.   Review of Systems  Constitutional: Positive for activity change and fatigue.  HENT: Negative.   Respiratory: Positive for chest tightness and shortness of breath.   Cardiovascular: Positive for chest pain and palpitations.  Gastrointestinal: Negative.   Endocrine: Negative.   Genitourinary: Negative.   Musculoskeletal: Positive for arthralgias (Chronic), myalgias (Chronic), neck pain (Chronic) and neck stiffness  (Chronic).  Skin: Negative.   Allergic/Immunologic: Negative.   Neurological: Positive for weakness.  Hematological: Negative.   Psychiatric/Behavioral: The patient is nervous/anxious.   All other systems reviewed and are negative.      Objective:   Physical Exam  Constitutional: He is oriented to person, place, and time. He appears well-nourished.  Non-toxic appearance.  Obvious severe kyphscoliosis  HENT:  Head: Atraumatic.  Leonine facies  Eyes: Pupils are equal, round, and reactive to light.  Neck: No JVD present. No tracheal deviation present. No thyromegaly present.  Webbed neck with very limited range of motion  Cardiovascular: Regular rhythm.  No extrasystoles are present. Tachycardia present. Exam reveals no gallop and no friction rub.  No murmur heard. Pulmonary/Chest: Tachypnea noted. He has decreased breath sounds (Distant sounding diffusely.). He has no wheezes. He has no rhonchi. He has no rales.  Abdominal: Soft. He exhibits no distension.  Musculoskeletal:       Right lower leg: He exhibits edema.       Left lower leg: He exhibits edema.  Trace to 1+ edema of the lower extremities  Lymphadenopathy:    He has no cervical adenopathy.  Neurological: He is alert and oriented to person, place, and time.  Skin: Skin is warm and dry. Nails show no clubbing.  Has deformity of the left thumb (hypoplastic)  Psychiatric: He has a normal mood and affect.  Somewhat depressed mood.  Nursing note and vitals reviewed. Note: that his blood pressure was 150/100. He states that he felt anxious about the visit.  I have reviewed  the patient's available x-ray data and laboratory data. Most recent CBC and complete metabolic profile was performed in January 2019.       Assessment & Plan:   1) Dyspnea: the patient's dyspnea is likely related to restrictive physiology from his severe kyphosis. However, I cannot rule out potential cardiac etiology as patients with Klippel-Fiel  syndrome can also have cardiac issues particularly cardiomyopathies. Patient also may be getting into worsening respiratory muscle weakness associated with his Klippel-Fiel syndrome. For this reason will obtain pulmonary function testing as well as 2D Echo. The patient has found that he does get some relief from albuterol he may have an element of mild obstructive defect superimposed on restrictive defect. I am not sure how well he is depositing his medications so therefore he provided him with a spacer. Also obtain chest x-ray and CBC, complete metabolic profile arterial blood gas.  2) Restrictive lung physiology due to kyphoscoliosis: this issue is likely causing the majority of his symptomatology. I suspect that he is having issues with progressive neuromuscular weakness associated with this. We will obtain pulmonary function testing and arterial blood gas to evaluate this issue. His options in this regard will be limited. He has fairly severe stigmata of Klippel-Fiel syndrome and usually these individuals life expectancy is 47 years. He has CO2 retention, he may benefit from noninvasive ventilation at home.  3) Klippel-Fiel syndrome: he exhibits all the stigmata associated with this including severe kyphoscoliosis and agrees cervical spine mobility.  4) Sleep disordered breathing: the patient is exhibiting some issues with sleep disordered breathing and non-restorative sleep. Will obtain overnight oximetry to exclude nocturnal hypoxemia. He may require formal sleep study, this may require to be referred to the sleep lab as his issues are going to be likely complex.  5) Poorly controlled essential hypertension: will defer management his primary care physician. The patient stated that he was "nervous" about the visit today and felt his blood pressure was elevated due to that. However as noted to his diastolic pressure was 038. He may have an element of diastolic dysfunction which may be aggravating his  dyspnea. He states that he can measure his blood pressure at home and will notify if this continues to be elevated.   Thank you for allowing me to participate in this patient's care.

## 2018-01-26 MED ORDER — SPACER/AERO-HOLDING CHAMBERS DEVI: 1.0000 | Freq: Four times a day (QID) | 0 refills | Status: DC

## 2018-01-30 ENCOUNTER — Encounter: Payer: Self-pay | Admitting: Pulmonary Disease

## 2018-01-30 DIAGNOSIS — M419 Scoliosis, unspecified: Secondary | ICD-10-CM | POA: Insufficient documentation

## 2018-02-04 ENCOUNTER — Encounter: Payer: Self-pay | Admitting: Emergency Medicine

## 2018-02-04 ENCOUNTER — Inpatient Hospital Stay
Admission: EM | Admit: 2018-02-04 | Discharge: 2018-02-13 | DRG: 291 | Disposition: A | Discharge status: Home / Self Care | Payer: Medicare Other | Attending: Internal Medicine | Admitting: Internal Medicine

## 2018-02-04 ENCOUNTER — Emergency Department: Payer: Medicare Other

## 2018-02-04 DIAGNOSIS — J9601 Acute respiratory failure with hypoxia: Secondary | ICD-10-CM | POA: Diagnosis present

## 2018-02-04 DIAGNOSIS — Z8249 Family history of ischemic heart disease and other diseases of the circulatory system: Secondary | ICD-10-CM

## 2018-02-04 DIAGNOSIS — I5031 Acute diastolic (congestive) heart failure: Secondary | ICD-10-CM

## 2018-02-04 DIAGNOSIS — R0603 Acute respiratory distress: Secondary | ICD-10-CM | POA: Diagnosis not present

## 2018-02-04 DIAGNOSIS — G43909 Migraine, unspecified, not intractable, without status migrainosus: Secondary | ICD-10-CM | POA: Diagnosis present

## 2018-02-04 DIAGNOSIS — N179 Acute kidney failure, unspecified: Secondary | ICD-10-CM | POA: Diagnosis present

## 2018-02-04 DIAGNOSIS — G4730 Sleep apnea, unspecified: Secondary | ICD-10-CM | POA: Diagnosis present

## 2018-02-04 DIAGNOSIS — J810 Acute pulmonary edema: Secondary | ICD-10-CM

## 2018-02-04 DIAGNOSIS — I248 Other forms of acute ischemic heart disease: Secondary | ICD-10-CM | POA: Diagnosis present

## 2018-02-04 DIAGNOSIS — E669 Obesity, unspecified: Secondary | ICD-10-CM | POA: Diagnosis present

## 2018-02-04 DIAGNOSIS — B370 Candidal stomatitis: Secondary | ICD-10-CM | POA: Diagnosis not present

## 2018-02-04 DIAGNOSIS — Z885 Allergy status to narcotic agent status: Secondary | ICD-10-CM

## 2018-02-04 DIAGNOSIS — Z87891 Personal history of nicotine dependence: Secondary | ICD-10-CM

## 2018-02-04 DIAGNOSIS — I5021 Acute systolic (congestive) heart failure: Secondary | ICD-10-CM | POA: Diagnosis present

## 2018-02-04 DIAGNOSIS — I110 Hypertensive heart disease with heart failure: Principal | ICD-10-CM | POA: Diagnosis present

## 2018-02-04 DIAGNOSIS — Z79899 Other long term (current) drug therapy: Secondary | ICD-10-CM

## 2018-02-04 DIAGNOSIS — J984 Other disorders of lung: Secondary | ICD-10-CM | POA: Diagnosis present

## 2018-02-04 DIAGNOSIS — R061 Stridor: Secondary | ICD-10-CM

## 2018-02-04 DIAGNOSIS — J45909 Unspecified asthma, uncomplicated: Secondary | ICD-10-CM | POA: Diagnosis present

## 2018-02-04 DIAGNOSIS — I5043 Acute on chronic combined systolic (congestive) and diastolic (congestive) heart failure: Secondary | ICD-10-CM | POA: Diagnosis present

## 2018-02-04 DIAGNOSIS — R0602 Shortness of breath: Secondary | ICD-10-CM | POA: Diagnosis not present

## 2018-02-04 DIAGNOSIS — I10 Essential (primary) hypertension: Secondary | ICD-10-CM | POA: Diagnosis present

## 2018-02-04 DIAGNOSIS — Z6836 Body mass index (BMI) 36.0-36.9, adult: Secondary | ICD-10-CM

## 2018-02-04 DIAGNOSIS — H109 Unspecified conjunctivitis: Secondary | ICD-10-CM | POA: Diagnosis present

## 2018-02-04 DIAGNOSIS — R6252 Short stature (child): Secondary | ICD-10-CM | POA: Diagnosis present

## 2018-02-04 DIAGNOSIS — H409 Unspecified glaucoma: Secondary | ICD-10-CM | POA: Diagnosis present

## 2018-02-04 DIAGNOSIS — Z933 Colostomy status: Secondary | ICD-10-CM

## 2018-02-04 DIAGNOSIS — W07XXXA Fall from chair, initial encounter: Secondary | ICD-10-CM | POA: Diagnosis not present

## 2018-02-04 DIAGNOSIS — K219 Gastro-esophageal reflux disease without esophagitis: Secondary | ICD-10-CM | POA: Diagnosis present

## 2018-02-04 DIAGNOSIS — Q039 Congenital hydrocephalus, unspecified: Secondary | ICD-10-CM

## 2018-02-04 DIAGNOSIS — M1990 Unspecified osteoarthritis, unspecified site: Secondary | ICD-10-CM | POA: Diagnosis present

## 2018-02-04 DIAGNOSIS — Y92230 Patient room in hospital as the place of occurrence of the external cause: Secondary | ICD-10-CM | POA: Diagnosis not present

## 2018-02-04 DIAGNOSIS — Q761 Klippel-Feil syndrome: Secondary | ICD-10-CM

## 2018-02-04 DIAGNOSIS — F410 Panic disorder [episodic paroxysmal anxiety] without agoraphobia: Secondary | ICD-10-CM | POA: Diagnosis present

## 2018-02-04 DIAGNOSIS — E785 Hyperlipidemia, unspecified: Secondary | ICD-10-CM | POA: Diagnosis present

## 2018-02-04 DIAGNOSIS — M419 Scoliosis, unspecified: Secondary | ICD-10-CM | POA: Diagnosis present

## 2018-02-04 MED ORDER — RACEPINEPHRINE HCL 2.25 % IN NEBU
0.5000 mL | INHALATION_SOLUTION | Freq: Once | RESPIRATORY_TRACT | Status: AC
  Administered 2018-02-04: 0.5 mL via RESPIRATORY_TRACT
  Filled 2018-02-04: qty 0.5

## 2018-02-04 MED ORDER — RACEPINEPHRINE HCL 2.25 % IN NEBU
INHALATION_SOLUTION | RESPIRATORY_TRACT | Status: AC
  Administered 2018-02-04: 0.5 mL via RESPIRATORY_TRACT
  Filled 2018-02-04: qty 0.5

## 2018-02-04 MED ORDER — DEXAMETHASONE SODIUM PHOSPHATE 10 MG/ML IJ SOLN
10.0000 mg | Freq: Once | INTRAMUSCULAR | Status: AC
  Administered 2018-02-04: 10 mg via INTRAVENOUS
  Filled 2018-02-04: qty 1

## 2018-02-04 NOTE — ED Triage Notes (Signed)
Patient with complaint of shortness of breath times one week that has gotten worse. Patient states that he has restrictive lung disease. Patient states that he has been using his inhalers with no improvement.

## 2018-02-04 NOTE — ED Provider Notes (Signed)
[facility] Emergency Department Provider Note  ____________________________________________   First MD Initiated Contact with Patient 02/04/18 2255     (approximate)  I have reviewed the triage vital signs and the nursing notes.   HISTORY  Chief Complaint Shortness of Breath   HPI Scott [name] is a 47 y.o. male who self presents to the emergency department with 1 week of progressive shortness of breath.  He has a complex past medical history including a congenital disorder known as Klippel-Feil syndrome.  This leads him to have restrictive lung disease.  He is being worked up and evaluated by lobe our pulmonology and is supposed to have an echocardiogram and pulmonary function tests coming up.  He says for the past week or so he has had slowly progressive now moderate to severe shortness of breath.  His symptoms are worse with exertion and worse when lying flat.   He has used albuterol with only intermittent mild relief.  He is felt subjectively febrile but is not measured 1.  He does have a cough although is nonproductive.  He does not use her oxygen at home.  He came to the emergency department tonight because his shortness of breath became acutely worse several hours ago and he is unable to take more than a few steps before feeling exhausted.   Past Medical History:  Diagnosis Date  . Acid reflux   . Anxiety   . Arthritis   . Asthma   . Depression   . Glaucoma   . Hypertension     Patient Active Problem List   Diagnosis Date Noted  . Accelerated hypertension 02/05/2018  . Acute systolic CHF (congestive heart failure) ([place]) 02/05/2018  . Kyphoscoliosis and scoliosis 01/30/2018  . Erectile dysfunction 09/09/2016  . Disorder of bursae of shoulder region 09/07/2016  . Impingement syndrome of shoulder region 09/07/2016  . Anxiety 09/11/2014  . Airway hyperreactivity 09/11/2014  . CN (constipation) 09/11/2014  . Alopecia, male pattern 09/11/2014  .  Decreased libido 09/11/2014  . Abnormal respiratory rate 09/11/2014  . Dysfunction of eustachian tube 09/11/2014  . Groin strain 09/11/2014  . Cephalalgia 09/11/2014  . Umbilical hernia 66/29/4765  . Essential hypertension 09/11/2014  . Migraine 09/11/2014  . Neoplasm of uncertain behavior 46/50/3546  . Hernia of anterior abdominal wall 09/11/2014  . Contact dermatitis due to Genus Toxicodendron 09/11/2014  . Open-angle glaucoma 05/20/2009  . Congenital hydrocephalus ([place]) 05/20/2009  . 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 Enteritis presumed infectious 06/11/2008  . HLD (hyperlipidemia) 03/15/2008  . Clinical depression 06/13/2007  . Essential (primary) hypertension 06/13/2007    Past Surgical History:  Procedure Laterality Date  . COLOSTOMY    . [place] SHUNT PLACEMENT      Prior to Admission medications   Medication Sig Start Date End Date Taking? Authorizing Provider  albuterol (PROVENTIL HFA;VENTOLIN HFA) 108 (90 Base) MCG/ACT inhaler Inhale 2 puffs into the lungs every 4 (four) hours as needed for wheezing or shortness of breath. 01/11/18  Yes Chauvin, Herbie [place], PA  ANDROGEL PUMP 20.25 MG/ACT (1.62%) GEL Apply 1 application topically daily.  09/04/14  Yes [provider]  brinzolamide (AZOPT) 1 % ophthalmic suspension Place 1 drop into both eyes daily.    Yes [provider]  bromocriptine (PARLODEL) 2.5 MG tablet Take 7.5 mg by mouth daily.  09/10/14  Yes [provider]  diazepam (VALIUM) 2 MG tablet Take 1 tablet by mouth as needed.   Yes [provider]  hydrochlorothiazide (HYDRODIURIL)  25 MG tablet Take 1 tablet (25 mg total) by mouth daily. Start after 1 week if your blood pressure is still elevated 04/23/17  Yes Earleen Newport, MD  ibuprofen (ADVIL,MOTRIN) 600 MG tablet Take 1 tablet (600 mg total) by mouth every 8 (eight) hours as needed. 07/22/16  Yes [name], [name], PA  latanoprost (XALATAN) 0.005 % ophthalmic solution Place 1 drop  into both eyes at bedtime.  07/28/07  Yes [provider]  oxymetazoline (AFRIN) 0.05 % nasal spray Place 2 sprays into both nostrils 2 (two) times daily. 04/23/17 04/23/18 Yes Earleen Newport, MD  PARoxetine (PAXIL) 40 MG tablet Take 1 tablet (40 mg total) by mouth daily. 09/29/17  Yes [name], [name], PA  quinapril (ACCUPRIL) 20 MG tablet TAKE 1 TABLET BY MOUTH EVERY DAY 12/02/17  Yes [name], Simona Huh E, PA  sildenafil (REVATIO) 20 MG tablet Take 1-5 tablets by mouth. 1 hour before needed. Max 5 tablets per 24 hours. 09/09/16  Yes [provider]  Spacer/Aero-Holding Chambers DEVI 1 Device by Does not apply route 4 (four) times daily. 01/26/18   Tyler Pita, MD    Allergies Codeine  Family History  Problem Relation Age of Onset  . Multiple sclerosis Mother   . Colon cancer Father   . Heart disease Father   . Lung cancer Maternal Grandmother   . Thyroid cancer Maternal Grandfather   . Ovarian cancer Paternal Grandmother   . Cancer Paternal Grandfather     Social History Social History   Tobacco Use  . Smoking status: Former Smoker    Packs/day: 1.00    Years: 10.00    Pack years: 10.00    Types: Cigarettes    Last attempt to quit: 05/2006    Years since quitting: 11.7  . Smokeless tobacco: Never Used  . Tobacco comment: quit 05/2006  Substance Use Topics  . Alcohol use: No    Alcohol/week: 0.0 standard drinks  . Drug use: No    Review of Systems Constitutional: Positive for fevers Eyes: No visual changes. ENT: Positive for sore throat. Cardiovascular: Positive for chest pain. Respiratory: Positive for shortness of breath. Gastrointestinal: No abdominal pain.  No nausea, no vomiting.  No diarrhea.  No constipation. Genitourinary: Negative for dysuria. Musculoskeletal: Negative for back pain. Skin: Negative for rash. Neurological: Negative for headaches, focal weakness or  numbness.   ____________________________________________   PHYSICAL EXAM:  VITAL SIGNS: ED Triage Vitals [02/04/18 2254]  Enc Vitals Group     BP (!) 211/130     Pulse Rate (!) 106     Resp (!) 36     Temp      Temp src      SpO2 (!) 88 %     Weight      Height      Head Circumference      Peak Flow      Pain Score      Pain Loc      Pain Edu?      Excl. in [place]?     Constitutional: Alert and oriented x4 appears obviously short of breath with stridulous breath sounds.  Syndromic appearing with short neck and barrel chest Eyes: PERRL EOMI. Head: Atraumatic. Nose: No congestion/rhinnorhea. Mouth/Throat: No trismus Neck: Positive for stridor.  Unable to lie flat whatsoever he does have JVD Cardiovascular: Tachycardic rate, regular rhythm. Grossly normal heart sounds.  Good peripheral circulation. Respiratory: Increased respiratory effort although lungs are actually clear and moving good  air.  His increased effort appears to be from upper airway Gastrointestinal: Soft nontender Musculoskeletal: No lower extremity edema   Neurologic:  Normal speech and language. No gross focal neurologic deficits are appreciated. Skin:  Skin is warm, dry and intact. No rash noted. Psychiatric: Anxious appearing    ____________________________________________   DIFFERENTIAL includes but not limited to  Laryngotracheitis, pneumonia, heart failure, pulmonary embolism ____________________________________________   LABS (all labs ordered are listed, but only abnormal results are displayed)  Labs Reviewed  COMPREHENSIVE METABOLIC PANEL - Abnormal; Notable for the following components:      Result Value   CO2 34 (*)    Glucose, Bld 100 (*)    Calcium 8.7 (*)    ALT 52 (*)    All other components within normal limits  TROPONIN I - Abnormal; Notable for the following components:   Troponin I 0.06 (*)    All other components within normal limits  BRAIN NATRIURETIC PEPTIDE - Abnormal;  Notable for the following components:   B Natriuretic Peptide 216.0 (*)    All other components within normal limits  CBC WITH DIFFERENTIAL/PLATELET - Abnormal; Notable for the following components:   Monocytes Absolute 1.3 (*)    All other components within normal limits  BLOOD GAS, ARTERIAL - Abnormal; Notable for the following components:   Bicarbonate 29.2 (*)    Acid-Base Excess 3.9 (*)    All other components within normal limits  BASIC METABOLIC PANEL - Abnormal; Notable for the following components:   CO2 35 (*)    Glucose, Bld 191 (*)    Calcium 8.8 (*)    All other components within normal limits  TROPONIN I - Abnormal; Notable for the following components:   Troponin I 0.04 (*)    All other components within normal limits  RESPIRATORY PANEL BY PCR  INFLUENZA PANEL BY PCR (TYPE A & B)  LACTIC ACID, PLASMA  CBC  HIV ANTIBODY (ROUTINE TESTING W REFLEX)  TROPONIN I  TROPONIN I    Lab work reviewed by me with elevated troponin which is concerning for either demand versus stretch and likely not primary myocardial ischemia.  Elevated BNP is again consistent with stretch and pulmonary edema __________________________________________  EKG  ED ECG REPORT I, Darel Hong, the attending physician, personally viewed and interpreted this ECG.  Date: 02/05/2018 EKG Time:  Rate: 102 Rhythm: Sinus tachycardia QRS Axis: Leftward axis Intervals: normal Narrative Interpretation: Wavy baseline makes interpretation difficult but he does appear to have left ventricular hypertrophy with expected repolarization abnormalities and no signs of acute ischemia  ____________________________________________  RADIOLOGY  Chest x-ray reviewed by me consistent with pulmonary congestion ____________________________________________   PROCEDURES  Procedure(s) performed: Yes  ARTERIAL BLOOD GAS Date/Time: 02/04/2018 11:08 PM Performed by: Darel Hong, MD Authorized by: Darel Hong, MD   Consent:    Consent obtained:  Verbal   Consent given by:  Patient   Risks discussed:  Bleeding, pain and impaired circulation   Alternatives discussed:  Alternative treatment Anesthesia (see MAR for exact dosages):    Anesthesia method:  None Procedure details:    Location:  R radial   Allen's test performed: yes     Number of attempts:  3 Post-procedure details:    Dressing applied: yes     Bleeding:  Manual pressure applied   Circulation, movement, and sensation:  Normal   Patient tolerance of procedure:  Tolerated well, no immediate complications  .Critical Care Performed by: Darel Hong, MD Authorized by:  Darel Hong, MD   Critical care provider statement:    Critical care time (minutes):  30   Critical care time was exclusive of:  Separately billable procedures and treating other patients   Critical care was necessary to treat or prevent imminent or life-threatening deterioration of the following conditions:  Respiratory failure   Critical care was time spent personally by me on the following activities:  Development of treatment plan with patient or surrogate, discussions with consultants, evaluation of patient's response to treatment, examination of patient, obtaining history from patient or surrogate, ordering and performing treatments and interventions, ordering and review of laboratory studies, ordering and review of radiographic studies, pulse oximetry, re-evaluation of patient's condition and review of old charts    Critical Care performed: no  ____________________________________________   INITIAL IMPRESSION / ASSESSMENT AND PLAN / ED COURSE  Pertinent labs & imaging results that were available during my care of the patient were reviewed by me and considered in my medical decision making (see chart for details).   As part of my medical decision making, I reviewed the following data within the [place] History obtained from  family if available, nursing notes, old chart and ekg, as well as notes from prior ED visits.  The patient comes to the emergency department obviously quite short of breath and labored breathing.  His lung sounds are actually quite clear and he sounds stridulous and he feels like his symptoms are "up high".  Of drawn an arterial blood gas to evaluate his pulmonary function given his chronic disease and I will treat him with racemic epinephrine and dexamethasone now almost as if this were croup will checking blood work including a lactic acid flu swab and respiratory virus panel.    ----------------------------------------- 12:01 AM on 02/05/2018 -----------------------------------------  After racemic epinephrine the patient's symptoms are significantly improved.  He does have an elevated BNP and a chest x-ray concerning for pulmonary congestion.  His troponin is likewise elevated.  At this point he requires inpatient admission given his new oxygen requirements and his likely diagnosis of new onset congestive heart failure.  He is tolerating nasal cannula and protecting his airway nicely at this point.  I discussed with the hospitalist who has graciously agreed to admit the patient to his service. ____________________________________________   FINAL CLINICAL IMPRESSION(S) / ED DIAGNOSES  Final diagnoses:  Stridor  Acute pulmonary edema ([place])  Respiratory distress      NEW MEDICATIONS STARTED DURING THIS VISIT:  Current Discharge Medication List       Note:  This document was prepared using Dragon voice recognition software and may include unintentional dictation errors.     Darel Hong, MD 02/05/18 [phone]

## 2018-02-05 ENCOUNTER — Observation Stay (HOSPITAL_BASED_OUTPATIENT_CLINIC_OR_DEPARTMENT_OTHER)
Admit: 2018-02-05 | Discharge: 2018-02-05 | Disposition: A | Discharge status: Home / Self Care | Payer: Medicare Other | Attending: Internal Medicine | Admitting: Internal Medicine

## 2018-02-05 ENCOUNTER — Other Ambulatory Visit: Payer: Self-pay

## 2018-02-05 DIAGNOSIS — I5031 Acute diastolic (congestive) heart failure: Secondary | ICD-10-CM | POA: Diagnosis not present

## 2018-02-05 DIAGNOSIS — I10 Essential (primary) hypertension: Secondary | ICD-10-CM | POA: Diagnosis present

## 2018-02-05 DIAGNOSIS — E785 Hyperlipidemia, unspecified: Secondary | ICD-10-CM | POA: Diagnosis not present

## 2018-02-05 DIAGNOSIS — I5021 Acute systolic (congestive) heart failure: Secondary | ICD-10-CM | POA: Diagnosis present

## 2018-02-05 DIAGNOSIS — I5030 Unspecified diastolic (congestive) heart failure: Secondary | ICD-10-CM

## 2018-02-05 LAB — RESPIRATORY PANEL BY PCR
Adenovirus: NOT DETECTED
BORDETELLA PERTUSSIS-RVPCR: NOT DETECTED
CHLAMYDOPHILA PNEUMONIAE-RVPPCR: NOT DETECTED
CORONAVIRUS 229E-RVPPCR: NOT DETECTED
CORONAVIRUS HKU1-RVPPCR: NOT DETECTED
Coronavirus NL63: NOT DETECTED
Coronavirus OC43: NOT DETECTED
Influenza A: NOT DETECTED
Influenza B: NOT DETECTED
Metapneumovirus: NOT DETECTED
Mycoplasma pneumoniae: NOT DETECTED
PARAINFLUENZA VIRUS 2-RVPPCR: NOT DETECTED
Parainfluenza Virus 1: NOT DETECTED
Parainfluenza Virus 3: NOT DETECTED
Parainfluenza Virus 4: NOT DETECTED
Respiratory Syncytial Virus: NOT DETECTED
Rhinovirus / Enterovirus: NOT DETECTED

## 2018-02-05 LAB — BASIC METABOLIC PANEL
Anion gap: 7 (ref 5–15)
BUN: 18 mg/dL (ref 6–20)
CALCIUM: 8.8 mg/dL — AB (ref 8.9–10.3)
CO2: 35 mmol/L — ABNORMAL HIGH (ref 22–32)
CREATININE: 0.94 mg/dL (ref 0.61–1.24)
Chloride: 102 mmol/L (ref 98–111)
GFR calc non Af Amer: >60 mL/min (ref >60)
Glucose, Bld: 191 mg/dL — ABNORMAL HIGH (ref 70–99)
Potassium: 4.2 mmol/L (ref 3.5–5.1)
SODIUM: 144 mmol/L (ref 135–145)

## 2018-02-05 LAB — INFLUENZA PANEL BY PCR (TYPE A & B)
INFLAPCR: NEGATIVE
INFLBPCR: NEGATIVE

## 2018-02-05 LAB — CBC WITH DIFFERENTIAL/PLATELET
Abs Immature Granulocytes: 0.04 10*3/uL (ref 0.00–0.07)
Basophils Absolute: 0 10*3/uL (ref 0.0–0.1)
Basophils Relative: 0 %
EOS ABS: 0.2 10*3/uL (ref 0.0–0.5)
Eosinophils Relative: 2 %
HEMATOCRIT: 46.5 % (ref 39.0–52.0)
HEMOGLOBIN: 14.5 g/dL (ref 13.0–17.0)
IMMATURE GRANULOCYTES: 0 %
LYMPHS ABS: 2 10*3/uL (ref 0.7–4.0)
LYMPHS PCT: 21 %
MCH: 29.5 pg (ref 26.0–34.0)
MCHC: 31.2 g/dL (ref 30.0–36.0)
MCV: 94.5 fL (ref 80.0–100.0)
MONOS PCT: 13 %
Monocytes Absolute: 1.3 10*3/uL — ABNORMAL HIGH (ref 0.1–1.0)
NEUTROS PCT: 64 %
Neutro Abs: 6 10*3/uL (ref 1.7–7.7)
Platelets: 329 10*3/uL (ref 150–400)
RBC: 4.92 MIL/uL (ref 4.22–5.81)
RDW: 13.4 % (ref 11.5–15.5)
WBC: 9.5 10*3/uL (ref 4.0–10.5)
nRBC: 0 % (ref 0.0–0.2)

## 2018-02-05 LAB — CBC
HCT: 46.1 % (ref 39.0–52.0)
Hemoglobin: 14.3 g/dL (ref 13.0–17.0)
MCH: 29.8 pg (ref 26.0–34.0)
MCHC: 31 g/dL (ref 30.0–36.0)
MCV: 96 fL (ref 80.0–100.0)
NRBC: 0 % (ref 0.0–0.2)
PLATELETS: 286 10*3/uL (ref 150–400)
RBC: 4.8 MIL/uL (ref 4.22–5.81)
RDW: 13.6 % (ref 11.5–15.5)
WBC: 9.1 10*3/uL (ref 4.0–10.5)

## 2018-02-05 LAB — LACTIC ACID, PLASMA: LACTIC ACID, VENOUS: 0.9 mmol/L (ref 0.5–1.9)

## 2018-02-05 LAB — COMPREHENSIVE METABOLIC PANEL
ALT: 52 U/L — AB (ref 0–44)
AST: 37 U/L (ref 15–41)
Albumin: 3.6 g/dL (ref 3.5–5.0)
Alkaline Phosphatase: 75 U/L (ref 38–126)
Anion gap: 5 (ref 5–15)
BILIRUBIN TOTAL: 0.6 mg/dL (ref 0.3–1.2)
BUN: 20 mg/dL (ref 6–20)
CHLORIDE: 106 mmol/L (ref 98–111)
CO2: 34 mmol/L — ABNORMAL HIGH (ref 22–32)
CREATININE: 0.9 mg/dL (ref 0.61–1.24)
Calcium: 8.7 mg/dL — ABNORMAL LOW (ref 8.9–10.3)
GFR calc Af Amer: >60 mL/min (ref >60)
GLUCOSE: 100 mg/dL — AB (ref 70–99)
Potassium: 4.1 mmol/L (ref 3.5–5.1)
Sodium: 145 mmol/L (ref 135–145)
TOTAL PROTEIN: 7.2 g/dL (ref 6.5–8.1)

## 2018-02-05 LAB — ECHOCARDIOGRAM COMPLETE
HEIGHTINCHES: 62 in
Weight: 3150.4 oz

## 2018-02-05 LAB — BRAIN NATRIURETIC PEPTIDE: B Natriuretic Peptide: 216 pg/mL — ABNORMAL HIGH (ref 0.0–100.0)

## 2018-02-05 LAB — TROPONIN I
TROPONIN I: 0.06 ng/mL — AB (ref <0.03)
Troponin I: 0.04 ng/mL (ref <0.03)
Troponin I: 0.03 ng/mL (ref <0.03)
Troponin I: 0.06 ng/mL (ref <0.03)

## 2018-02-05 MED ORDER — BROMOCRIPTINE MESYLATE 2.5 MG PO TABS
7.5000 mg | ORAL_TABLET | Freq: Every day | ORAL | Status: DC
  Administered 2018-02-05 – 2018-02-13 (×9): 7.5 mg via ORAL
  Filled 2018-02-05 (×9): qty 3

## 2018-02-05 MED ORDER — FUROSEMIDE 10 MG/ML IJ SOLN
40.0000 mg | Freq: Two times a day (BID) | INTRAMUSCULAR | Status: DC
  Administered 2018-02-05 – 2018-02-07 (×4): 40 mg via INTRAVENOUS
  Filled 2018-02-05 (×4): qty 4

## 2018-02-05 MED ORDER — ONDANSETRON HCL 4 MG/2ML IJ SOLN
4.0000 mg | Freq: Four times a day (QID) | INTRAMUSCULAR | Status: DC | PRN
  Administered 2018-02-07: 4 mg via INTRAVENOUS
  Filled 2018-02-05: qty 2

## 2018-02-05 MED ORDER — LABETALOL HCL 5 MG/ML IV SOLN
INTRAVENOUS | Status: AC
  Administered 2018-02-05: 10 mg via INTRAVENOUS
  Filled 2018-02-05: qty 4

## 2018-02-05 MED ORDER — ENOXAPARIN SODIUM 40 MG/0.4ML ~~LOC~~ SOLN
40.0000 mg | SUBCUTANEOUS | Status: DC
  Administered 2018-02-05 – 2018-02-12 (×8): 40 mg via SUBCUTANEOUS
  Filled 2018-02-05 (×8): qty 0.4

## 2018-02-05 MED ORDER — ACETAMINOPHEN 325 MG PO TABS
650.0000 mg | ORAL_TABLET | Freq: Four times a day (QID) | ORAL | Status: DC | PRN
  Administered 2018-02-11 (×2): 650 mg via ORAL
  Filled 2018-02-05 (×2): qty 2

## 2018-02-05 MED ORDER — HYDRALAZINE HCL 20 MG/ML IJ SOLN: 10.0000 mg | INTRAMUSCULAR | Status: DC | PRN

## 2018-02-05 MED ORDER — HYDROCHLOROTHIAZIDE 25 MG PO TABS
25.0000 mg | ORAL_TABLET | Freq: Every day | ORAL | Status: DC
  Administered 2018-02-05: 25 mg via ORAL
  Filled 2018-02-05: qty 1

## 2018-02-05 MED ORDER — LORAZEPAM 2 MG/ML IJ SOLN
1.0000 mg | Freq: Once | INTRAMUSCULAR | Status: DC
  Filled 2018-02-05: qty 1

## 2018-02-05 MED ORDER — LATANOPROST 0.005 % OP SOLN
1.0000 [drp] | Freq: Every day | OPHTHALMIC | Status: DC
  Administered 2018-02-05 – 2018-02-12 (×8): 1 [drp] via OPHTHALMIC
  Filled 2018-02-05: qty 2.5

## 2018-02-05 MED ORDER — LISINOPRIL 20 MG PO TABS
20.0000 mg | ORAL_TABLET | Freq: Every day | ORAL | Status: DC
  Administered 2018-02-05 – 2018-02-13 (×9): 20 mg via ORAL
  Filled 2018-02-05 (×9): qty 1

## 2018-02-05 MED ORDER — BRINZOLAMIDE 1 % OP SUSP
1.0000 [drp] | Freq: Every day | OPHTHALMIC | Status: DC
  Administered 2018-02-05 – 2018-02-13 (×9): 1 [drp] via OPHTHALMIC
  Filled 2018-02-05: qty 10

## 2018-02-05 MED ORDER — PAROXETINE HCL 20 MG PO TABS
40.0000 mg | ORAL_TABLET | Freq: Every day | ORAL | Status: DC
  Administered 2018-02-05 – 2018-02-12 (×8): 40 mg via ORAL
  Filled 2018-02-05 (×10): qty 2

## 2018-02-05 MED ORDER — FUROSEMIDE 10 MG/ML IJ SOLN
20.0000 mg | Freq: Once | INTRAMUSCULAR | Status: AC
  Administered 2018-02-05: 20 mg via INTRAVENOUS
  Filled 2018-02-05: qty 4

## 2018-02-05 MED ORDER — LABETALOL HCL 5 MG/ML IV SOLN
10.0000 mg | INTRAVENOUS | Status: DC | PRN
  Administered 2018-02-05: 10 mg via INTRAVENOUS

## 2018-02-05 MED ORDER — ACETAMINOPHEN 650 MG RE SUPP: 650.0000 mg | Freq: Four times a day (QID) | RECTAL | Status: DC | PRN

## 2018-02-05 MED ORDER — ONDANSETRON HCL 4 MG PO TABS: 4.0000 mg | ORAL_TABLET | Freq: Four times a day (QID) | ORAL | Status: DC | PRN

## 2018-02-05 MED ORDER — ALBUTEROL SULFATE (2.5 MG/3ML) 0.083% IN NEBU: 2.5000 mg | INHALATION_SOLUTION | RESPIRATORY_TRACT | Status: DC | PRN

## 2018-02-05 MED ORDER — QUINAPRIL HCL 10 MG PO TABS: 20.0000 mg | ORAL_TABLET | Freq: Every day | ORAL | Status: DC

## 2018-02-05 MED ORDER — LORAZEPAM 2 MG/ML IJ SOLN
1.0000 mg | Freq: Once | INTRAMUSCULAR | Status: AC
  Administered 2018-02-05: 1 mg via INTRAMUSCULAR

## 2018-02-05 NOTE — Care Management Obs Status (Addendum)
[place] NOTIFICATION   Patient Details  Name: Scott [name] MRN: [id number] Date of Birth: November 16, 1970   Medicare Observation Status Notification Given:  Yes  reviewed with caregiver at bedside Latanya Maudlin, RN 02/05/2018, 10:24 AM

## 2018-02-05 NOTE — Progress Notes (Signed)
Admitted this morning for shortness of breath found to have new onset congestive heart failure, receiving IV Lasix.  Patient found to have elevated blood pressure but better now. Labs, chest x-ray reviewed. Vitals: Blood pressure 143/109, heart rate 93, oxygen saturation 97% on rest. Assessment and plan #1 malignant hypertension on arrival, blood pressure is better now compared to yesterday. 2.  New onset CHF, continue IV Lasix 40 twice daily tolerated. History of asthma; continue bronchodilators. Time spent 15 minutes, discussed with caregiver.

## 2018-02-05 NOTE — Consult Note (Signed)
Cardiology Consultation:   Patient ID: Scott [name] MRN: [id number]; DOB: [birth year]  Admit date: 02/04/2018 Date of Consult: 02/05/2018  Primary Care Provider: Margo Common, PA Primary Cardiologist: No primary care provider on file.  Primary Electrophysiologist:  None    Patient Profile:   Scott [name] is a 47 y.o. male with a hx of congenital abnormality (Klippel-File syndrome) who is being seen today for the evaluation of CHF at the request of Dr Vianne Bulls.  History of Present Illness:   Scott [name] reports a several week history of progressive shortness of breath, edema, orthopnea, and PND.  He has no past history of congestive heart failure.  The patient does have a long-standing history of hypertension and he takes antihypertensive medication.  Several weeks ago his girlfriend noticed that he became more dyspneic with activity.  He has had to stop and rest much more than in the past, even with walking in a regular pace.  Over the past few weeks he has developed orthopnea and PND which has become progressively worse.  He became short of breath at rest and presented to the emergency department for further evaluation.  Chest x-ray showed cardiomegaly with mild pulmonary vascular congestion.  BNP and troponin were both mildly elevated.  The patient's exam was suggestive of congestive heart failure and he was started on IV diuretics.  He feels some improvement with oxygen supplementation and IV diuresis but continues to feel short of breath with any activity.  He also describes chest pressure associated with his shortness of breath.  Past Medical History:  Diagnosis Date  . Acid reflux   . Anxiety   . Arthritis   . Asthma   . Depression   . Glaucoma   . Hypertension     Past Surgical History:  Procedure Laterality Date  . COLOSTOMY    . [place] SHUNT PLACEMENT       Home Medications:  Prior to Admission medications   Medication Sig Start Date End Date Taking? Authorizing  Provider  albuterol (PROVENTIL HFA;VENTOLIN HFA) 108 (90 Base) MCG/ACT inhaler Inhale 2 puffs into the lungs every 4 (four) hours as needed for wheezing or shortness of breath. 01/11/18  Yes Chauvin, Herbie [place], PA  ANDROGEL PUMP 20.25 MG/ACT (1.62%) GEL Apply 1 application topically daily.  09/04/14  Yes [provider]  brinzolamide (AZOPT) 1 % ophthalmic suspension Place 1 drop into both eyes daily.    Yes [provider]  bromocriptine (PARLODEL) 2.5 MG tablet Take 7.5 mg by mouth daily.  09/10/14  Yes [provider]  diazepam (VALIUM) 2 MG tablet Take 1 tablet by mouth as needed.   Yes [provider]  hydrochlorothiazide (HYDRODIURIL) 25 MG tablet Take 1 tablet (25 mg total) by mouth daily. Start after 1 week if your blood pressure is still elevated 04/23/17  Yes Earleen Newport, MD  ibuprofen (ADVIL,MOTRIN) 600 MG tablet Take 1 tablet (600 mg total) by mouth every 8 (eight) hours as needed. 07/22/16  Yes [name], [name], PA  latanoprost (XALATAN) 0.005 % ophthalmic solution Place 1 drop into both eyes at bedtime.  07/28/07  Yes [provider]  oxymetazoline (AFRIN) 0.05 % nasal spray Place 2 sprays into both nostrils 2 (two) times daily. 04/23/17 04/23/18 Yes Earleen Newport, MD  PARoxetine (PAXIL) 40 MG tablet Take 1 tablet (40 mg total) by mouth daily. 09/29/17  Yes [name], [name], PA  quinapril (ACCUPRIL) 20 MG tablet TAKE 1 TABLET BY MOUTH EVERY DAY  12/02/17  Yes [name], [name], PA  sildenafil (REVATIO) 20 MG tablet Take 1-5 tablets by mouth. 1 hour before needed. Max 5 tablets per 24 hours. 09/09/16  Yes [provider]  Spacer/Aero-Holding Chambers DEVI 1 Device by Does not apply route 4 (four) times daily. 01/26/18   Tyler Pita, MD    Inpatient Medications: Scheduled Meds: . brinzolamide  1 drop Both Eyes Daily  . bromocriptine  7.5 mg Oral Daily  . enoxaparin (LOVENOX) injection  40 mg Subcutaneous Q24H  .  furosemide  40 mg Intravenous BID  . latanoprost  1 drop Both Eyes QHS  . lisinopril  20 mg Oral Daily  . PARoxetine  40 mg Oral Daily   Continuous Infusions:  PRN Meds: acetaminophen **OR** acetaminophen, albuterol, hydrALAZINE, labetalol, ondansetron **OR** ondansetron (ZOFRAN) IV  Allergies:    Allergies  Allergen Reactions  . Codeine Shortness Of Breath    Social History:   Social History   Socioeconomic History  . Marital status: Single    Spouse name: Not on file  . Number of children: Not on file  . Years of education: Not on file  . Highest education level: Not on file  Occupational History  . Not on file  Social Needs  . Financial resource strain: Not on file  . Food insecurity:    Worry: Not on file    Inability: Not on file  . Transportation needs:    Medical: Not on file    Non-medical: Not on file  Tobacco Use  . Smoking status: Former Smoker    Packs/day: 1.00    Years: 10.00    Pack years: 10.00    Types: Cigarettes    Last attempt to quit: 05/2006    Years since quitting: 11.7  . Smokeless tobacco: Never Used  . Tobacco comment: quit 05/2006  Substance and Sexual Activity  . Alcohol use: No    Alcohol/week: 0.0 standard drinks  . Drug use: No  . Sexual activity: Not on file  Lifestyle  . Physical activity:    Days per week: Not on file    Minutes per session: Not on file  . Stress: Not on file  Relationships  . Social connections:    Talks on phone: Not on file    Gets together: Not on file    Attends religious service: Not on file    Active member of club or organization: Not on file    Attends meetings of clubs or organizations: Not on file    Relationship status: Not on file  . Intimate partner violence:    Fear of current or ex partner: Not on file    Emotionally abused: Not on file    Physically abused: Not on file    Forced sexual activity: Not on file  Other Topics Concern  . Not on file  Social History Narrative  . Not on  file    Family History:    Family History  Problem Relation Age of Onset  . Multiple sclerosis Mother   . Colon cancer Father   . Heart disease Father   . Lung cancer Maternal Grandmother   . Thyroid cancer Maternal Grandfather   . Ovarian cancer Paternal Grandmother   . Cancer Paternal Grandfather      ROS:  Please see the history of present illness.  All other ROS reviewed and negative.     Physical Exam/Data:   Vitals:   02/05/18 8756 02/05/18 4332 02/05/18 9518  02/05/18 0723  BP: (!) 144/100  132/81 (!) 143/109  Pulse: 79  86 93  Resp: 20  18   Temp: 98.3 F (36.8 C)  98.5 F (36.9 C) 98.4 F (36.9 C)  TempSrc: Oral  Oral Oral  SpO2: 90%  96% 97%  Weight:  89.3 kg    Height:  5\' 2"  (1.575 m)      Intake/Output Summary (Last 24 hours) at 02/05/2018 1508 Last data filed at 02/05/2018 0836 Gross per 24 hour  Intake -  Output 1075 ml  Net -1075 ml   Filed Weights   02/04/18 2256 02/05/18 0334  Weight: 86.2 kg 89.3 kg   Body mass index is 36.01 kg/m.  General: The patient is in no acute distress on O2 per nasal cannula.  Congenital abnormalities of the hands and neck noted. HEENT: normal Lymph: no adenopathy Neck: JVP is moderately elevated Endocrine:  No thryomegaly Vascular: No carotid bruits; FA pulses 2+ bilaterally  Cardiac:  normal S1, S2; RRR; no murmur  Lungs: Bibasilar crackles are present Abd: soft, nontender, no hepatomegaly, abdomen is mildly distended Ext: 1+ bilateral pretibial edema is present Musculoskeletal: Congenital deformities noted, BUE and BLE strength normal and equal Skin: warm and dry  Neuro:  CNs 2-12 intact, no focal abnormalities noted Psych:  Normal affect   EKG:  The EKG was personally reviewed and demonstrates: Normal sinus rhythm with LVH and associated repolarization abnormality  Telemetry:  Telemetry was personally reviewed and demonstrates: Normal sinus rhythm  Relevant CV Studies: 2D echo images are personally  reviewed.  LV systolic function appears to be in the low normal range with an LVEF estimated at 50 to 55%.  I do not appreciate any significant valvular lesions.  There is no pericardial effusion.  Laboratory Data:  Chemistry Recent Labs  Lab 02/04/18 2307 02/05/18 0557  NA 145 144  K 4.1 4.2  CL 106 102  CO2 34* 35*  GLUCOSE 100* 191*  BUN 20 18  CREATININE 0.90 0.94  CALCIUM 8.7* 8.8*  GFRNONAA >60 >60  GFRAA >60 >60  ANIONGAP 5 7    Recent Labs  Lab 02/04/18 2307  PROT 7.2  ALBUMIN 3.6  AST 37  ALT 52*  ALKPHOS 75  BILITOT 0.6   Hematology Recent Labs  Lab 02/04/18 2307 02/05/18 0557  WBC 9.5 9.1  RBC 4.92 4.80  HGB 14.5 14.3  HCT 46.5 46.1  MCV 94.5 96.0  MCH 29.5 29.8  MCHC 31.2 31.0  RDW 13.4 13.6  PLT 329 286   Cardiac Enzymes Recent Labs  Lab 02/04/18 2307 02/05/18 0552 02/05/18 0847  TROPONINI 0.06* 0.04* 0.03*   No results for input(s): TROPIPOC in the last 168 hours.  BNP Recent Labs  Lab 02/04/18 2256  BNP 216.0*    DDimer No results for input(s): DDIMER in the last 168 hours.  Radiology/Studies:  Dg Chest Port 1 View  Result Date: 02/04/2018 CLINICAL DATA:  Shortness of breath. EXAM: PORTABLE CHEST 1 VIEW COMPARISON:  December 21, 2016 FINDINGS: Surgical hardware seen in the scoliotic thoracic spine. Evaluation of the chest is limited due to the patient's scoliosis in positioning. The cardiomediastinal silhouette is stable. No pulmonary nodules or masses. No focal infiltrates. Mild pulmonary venous congestion not excluded. No overt edema. IMPRESSION: Cardiomegaly. Possible mild pulmonary venous congestion. No overt edema. Electronically Signed   By: Dorise Bullion III M.D   On: 02/04/2018 23:57    Assessment and Plan:   1. This is a  47 year old gentleman with likely hypertensive heart disease who presents with acute diastolic heart failure: His echocardiogram is reviewed and demonstrates normal LV systolic function with no  significant valvular abnormalities.  We had a lengthy discussion about the etiology and natural history of diastolic heart failure.  The patient understands key components of his treatment will include a low-sodium diet, daily weights, medication adherence with good control of his hypertension, and likely a daily diuretic.  Recommend continuing the patient on furosemide 40 mg IV twice daily and follow his response.  We will continue his ACE inhibitor at current dose.  Follow blood pressure response.  2.  Mild troponin elevation: Likely related to acute heart failure.  Consider ischemia evaluation once he is better compensated.  3.  Hypertensive heart disease: As above  We will follow with you.  Patient would benefit from a dietitian consult and heart failure education would be helpful as well.  For questions or updates, please contact [place] Please consult [email or web address] for contact info under   Signed, Sherren Mocha, MD  02/05/2018 3:08 PM

## 2018-02-05 NOTE — Progress Notes (Signed)
*  PRELIMINARY RESULTS* Echocardiogram 2D Echocardiogram has been performed.  Scott [name] 02/05/2018, 9:49 AM

## 2018-02-05 NOTE — ED Notes (Signed)
Suspected IV infiltration

## 2018-02-05 NOTE — H&P (Signed)
[facility] [place] NAME: Scott [name]    MR#:  [id number]  DATE OF BIRTH:  1970-09-18  DATE OF ADMISSION:  02/04/2018  PRIMARY CARE PHYSICIAN: [name], [name], PA   REQUESTING/REFERRING PHYSICIAN: Mable Paris, MD  CHIEF COMPLAINT:   Chief Complaint  Patient presents with  . Shortness of Breath    HISTORY OF PRESENT ILLNESS:  Scott [name]  is a 47 y.o. male who presents with chief complaint as above.  Patient presents with a complaint of orthopnea, dyspnea, leg swelling, abdominal distention.  He states that the symptoms have been getting progressively worse over the last week.  His blood pressure is also notably very elevated when he arrived to the ED.  He states that he does have blood pressure elevations like this, but that it "does not last".  Here in the ED his x-ray is consistent with likely heart failure, as this is clinical picture.  Hospitalist were called for admission and further evaluation.    Of note, this patient does have congenital abnormality, Klippel-Fiel syndrome, most notable for his web neck, short stature, and significant scoliosis.  PAST MEDICAL HISTORY:   Past Medical History:  Diagnosis Date  . Acid reflux   . Anxiety   . Arthritis   . Asthma   . Depression   . Glaucoma   . Hypertension      PAST SURGICAL HISTORY:   Past Surgical History:  Procedure Laterality Date  . COLOSTOMY    . [place] SHUNT PLACEMENT       SOCIAL HISTORY:   Social History   Tobacco Use  . Smoking status: Former Smoker    Packs/day: 1.00    Years: 10.00    Pack years: 10.00    Types: Cigarettes    Last attempt to quit: 05/2006    Years since quitting: 11.7  . Smokeless tobacco: Never Used  . Tobacco comment: quit 05/2006  Substance Use Topics  . Alcohol use: No    Alcohol/week: 0.0 standard drinks     FAMILY HISTORY:   Family History  Problem Relation Age of Onset  . Multiple sclerosis Mother   . Colon  cancer Father   . Heart disease Father   . Lung cancer Maternal Grandmother   . Thyroid cancer Maternal Grandfather   . Ovarian cancer Paternal Grandmother   . Cancer Paternal Grandfather      DRUG ALLERGIES:   Allergies  Allergen Reactions  . Codeine Shortness Of Breath    MEDICATIONS AT HOME:   Prior to Admission medications   Medication Sig Start Date End Date Taking? Authorizing Provider  albuterol (PROVENTIL HFA;VENTOLIN HFA) 108 (90 Base) MCG/ACT inhaler Inhale 2 puffs into the lungs every 4 (four) hours as needed for wheezing or shortness of breath. 01/11/18   Carmon Ginsberg, PA  ANDROGEL PUMP 20.25 MG/ACT (1.62%) GEL  09/04/14   [provider]  brinzolamide (AZOPT) 1 % ophthalmic suspension Apply 1 drop to eye daily.    [provider]  bromocriptine (PARLODEL) 2.5 MG tablet Take 7.5 mg by mouth daily.  09/10/14   [provider]  diazepam (VALIUM) 2 MG tablet Take 1 tablet by mouth as needed.    [provider]  hydrochlorothiazide (HYDRODIURIL) 25 MG tablet Take 1 tablet (25 mg total) by mouth daily. Start after 1 week if your blood pressure is still elevated 04/23/17   Earleen Newport, MD  ibuprofen (ADVIL,MOTRIN) 600 MG tablet Take 1  tablet (600 mg total) by mouth every 8 (eight) hours as needed. 07/22/16   [name], [name], PA  latanoprost (XALATAN) 0.005 % ophthalmic solution 1 drop at bedtime. 07/28/07   [provider]  oxymetazoline (AFRIN) 0.05 % nasal spray Place 2 sprays into both nostrils 2 (two) times daily. 04/23/17 04/23/18  Earleen Newport, MD  PARoxetine (PAXIL) 40 MG tablet Take 1 tablet (40 mg total) by mouth daily. 09/29/17   [name], [name], PA  quinapril (ACCUPRIL) 20 MG tablet TAKE 1 TABLET BY MOUTH EVERY DAY 12/02/17   [name], Simona Huh E, PA  sildenafil (REVATIO) 20 MG tablet Take 1-5 tablets by mouth. 1 hour before needed. Max 5 tablets per 24 hours. 09/09/16   [provider]   Spacer/Aero-Holding Josiah Lobo DEVI 1 Device by Does not apply route 4 (four) times daily. 01/26/18   Tyler Pita, MD    REVIEW OF SYSTEMS:  Review of Systems  Constitutional: Negative for chills, fever, malaise/fatigue and weight loss.  HENT: Negative for ear pain, hearing loss and tinnitus.   Eyes: Negative for blurred vision, double vision, pain and redness.  Respiratory: Negative for cough, hemoptysis and shortness of breath.   Cardiovascular: Positive for orthopnea and leg swelling. Negative for chest pain and palpitations.  Gastrointestinal: Negative for abdominal pain, constipation, diarrhea, nausea and vomiting.       Abdominal distention  Genitourinary: Negative for dysuria, frequency and hematuria.  Musculoskeletal: Negative for back pain, joint pain and neck pain.  Skin:       No acne, rash, or lesions  Neurological: Negative for dizziness, tremors, focal weakness and weakness.  Endo/Heme/Allergies: Negative for polydipsia. Does not bruise/bleed easily.  Psychiatric/Behavioral: Negative for depression. The patient is not nervous/anxious and does not have insomnia.      VITAL SIGNS:   Vitals:   02/04/18 2254 02/04/18 2256 02/05/18 0002  BP: (!) 211/130  (!) 182/125  Pulse: (!) 106  92  Resp: (!) 36  20  Temp:  98.1 F (36.7 C)   TempSrc:  Oral   SpO2: (!) 88%  97%  Weight:  86.2 kg   Height:  5\' 2"  (1.575 m)    Wt Readings from Last 3 Encounters:  02/04/18 86.2 kg  01/25/18 89 kg  01/11/18 88.6 kg    PHYSICAL EXAMINATION:  Physical Exam  Vitals reviewed. Constitutional: He is oriented to person, place, and time. He appears well-developed and well-nourished. No distress.  HENT:  Head: Normocephalic and atraumatic.  Mouth/Throat: Oropharynx is clear and moist.  Eyes: Pupils are equal, round, and reactive to light. Conjunctivae and EOM are normal. No scleral icterus.  Neck: Normal range of motion. Neck supple. No JVD present. No thyromegaly present.   Cardiovascular: Normal rate, regular rhythm and intact distal pulses. Exam reveals no gallop and no friction rub.  No murmur heard. Respiratory: Effort normal. No respiratory distress. He has no wheezes. He has rales.  GI: Soft. Bowel sounds are normal. He exhibits distension. There is no tenderness.  Musculoskeletal: Normal range of motion. He exhibits edema.  No arthritis, no gout  Lymphadenopathy:    He has no cervical adenopathy.  Neurological: He is alert and oriented to person, place, and time. No cranial nerve deficit.  No dysarthria, no aphasia  Skin: Skin is warm and dry. No rash noted. No erythema.  Psychiatric: He has a normal mood and affect. His behavior is normal. Judgment and thought content normal.    LABORATORY PANEL:   CBC  Recent Labs  Lab 02/04/18 2307  WBC 9.5  HGB 14.5  HCT 46.5  PLT 329   ------------------------------------------------------------------------------------------------------------------  Chemistries  Recent Labs  Lab 02/04/18 2307  NA 145  K 4.1  CL 106  CO2 34*  GLUCOSE 100*  BUN 20  CREATININE 0.90  CALCIUM 8.7*  AST 37  ALT 52*  ALKPHOS 75  BILITOT 0.6   ------------------------------------------------------------------------------------------------------------------  Cardiac Enzymes Recent Labs  Lab 02/04/18 2307  TROPONINI 0.06*   ------------------------------------------------------------------------------------------------------------------  RADIOLOGY:  Dg Chest Port 1 View  Result Date: 02/04/2018 CLINICAL DATA:  Shortness of breath. EXAM: PORTABLE CHEST 1 VIEW COMPARISON:  December 21, 2016 FINDINGS: Surgical hardware seen in the scoliotic thoracic spine. Evaluation of the chest is limited due to the patient's scoliosis in positioning. The cardiomediastinal silhouette is stable. No pulmonary nodules or masses. No focal infiltrates. Mild pulmonary venous congestion not excluded. No overt edema. IMPRESSION:  Cardiomegaly. Possible mild pulmonary venous congestion. No overt edema. Electronically Signed   By: Dorise Bullion III M.D   On: 02/04/2018 23:57    EKG:   Orders placed or performed during the hospital encounter of 02/04/18  . ED EKG  . ED EKG  . EKG 12-Lead  . EKG 12-Lead    IMPRESSION AND PLAN:  Principal Problem:   Accelerated hypertension -he was given IV Lasix in the ED, his blood pressure has come down some, but is still significantly elevated, especially his systolic pressure.  Will administer additional IV antihypertensives for blood pressure control, goal is less than 160/100. Active Problems:   Acute systolic CHF (congestive heart failure) ([place]) -this would be a new diagnosis for this patient.  We will get an echocardiogram and cardiology consult.  Trend cardiac enzymes as they were mildly elevated.  His BNP was also mildly elevated   Essential (primary) hypertension -continue home meds once they have been appropriately reconciled, additional antihypertensives as above   HLD (hyperlipidemia) -Home dose antilipid  Chart review performed and case discussed with ED provider. Labs, imaging and/or ECG reviewed by provider and discussed with patient/family. Management plans discussed with the patient and/or family.  DVT PROPHYLAXIS: SubQ lovenox   GI PROPHYLAXIS:  None  ADMISSION STATUS: Observation  CODE STATUS: Full  TOTAL TIME TAKING CARE OF THIS PATIENT: 40 minutes.   Josceline Chenard FIELDING 02/05/2018, 1:13 AM  [employer]  [phone]  CC: Primary care physician; Margo Common, PA  Note:  This document was prepared using Dragon voice recognition software and may include unintentional dictation errors.

## 2018-02-06 DIAGNOSIS — Q761 Klippel-Feil syndrome: Secondary | ICD-10-CM | POA: Diagnosis not present

## 2018-02-06 DIAGNOSIS — I248 Other forms of acute ischemic heart disease: Secondary | ICD-10-CM | POA: Diagnosis present

## 2018-02-06 DIAGNOSIS — I5031 Acute diastolic (congestive) heart failure: Secondary | ICD-10-CM | POA: Diagnosis not present

## 2018-02-06 DIAGNOSIS — N179 Acute kidney failure, unspecified: Secondary | ICD-10-CM | POA: Diagnosis present

## 2018-02-06 DIAGNOSIS — M1990 Unspecified osteoarthritis, unspecified site: Secondary | ICD-10-CM | POA: Diagnosis present

## 2018-02-06 DIAGNOSIS — G4730 Sleep apnea, unspecified: Secondary | ICD-10-CM | POA: Diagnosis present

## 2018-02-06 DIAGNOSIS — J45909 Unspecified asthma, uncomplicated: Secondary | ICD-10-CM | POA: Diagnosis present

## 2018-02-06 DIAGNOSIS — I501 Left ventricular failure: Secondary | ICD-10-CM | POA: Diagnosis not present

## 2018-02-06 DIAGNOSIS — I5033 Acute on chronic diastolic (congestive) heart failure: Secondary | ICD-10-CM | POA: Diagnosis not present

## 2018-02-06 DIAGNOSIS — W07XXXA Fall from chair, initial encounter: Secondary | ICD-10-CM | POA: Diagnosis not present

## 2018-02-06 DIAGNOSIS — I5021 Acute systolic (congestive) heart failure: Secondary | ICD-10-CM | POA: Diagnosis not present

## 2018-02-06 DIAGNOSIS — I10 Essential (primary) hypertension: Secondary | ICD-10-CM | POA: Diagnosis not present

## 2018-02-06 DIAGNOSIS — R079 Chest pain, unspecified: Secondary | ICD-10-CM | POA: Diagnosis not present

## 2018-02-06 DIAGNOSIS — Z01810 Encounter for preprocedural cardiovascular examination: Secondary | ICD-10-CM | POA: Diagnosis not present

## 2018-02-06 DIAGNOSIS — R6252 Short stature (child): Secondary | ICD-10-CM | POA: Diagnosis present

## 2018-02-06 DIAGNOSIS — Y92230 Patient room in hospital as the place of occurrence of the external cause: Secondary | ICD-10-CM | POA: Diagnosis not present

## 2018-02-06 DIAGNOSIS — E669 Obesity, unspecified: Secondary | ICD-10-CM | POA: Diagnosis present

## 2018-02-06 DIAGNOSIS — R7989 Other specified abnormal findings of blood chemistry: Secondary | ICD-10-CM | POA: Diagnosis not present

## 2018-02-06 DIAGNOSIS — K219 Gastro-esophageal reflux disease without esophagitis: Secondary | ICD-10-CM | POA: Diagnosis present

## 2018-02-06 DIAGNOSIS — B370 Candidal stomatitis: Secondary | ICD-10-CM | POA: Diagnosis present

## 2018-02-06 DIAGNOSIS — Z87891 Personal history of nicotine dependence: Secondary | ICD-10-CM | POA: Diagnosis not present

## 2018-02-06 DIAGNOSIS — R061 Stridor: Secondary | ICD-10-CM | POA: Diagnosis not present

## 2018-02-06 DIAGNOSIS — J984 Other disorders of lung: Secondary | ICD-10-CM | POA: Diagnosis present

## 2018-02-06 DIAGNOSIS — I110 Hypertensive heart disease with heart failure: Secondary | ICD-10-CM | POA: Diagnosis present

## 2018-02-06 DIAGNOSIS — Q039 Congenital hydrocephalus, unspecified: Secondary | ICD-10-CM | POA: Diagnosis not present

## 2018-02-06 DIAGNOSIS — J9601 Acute respiratory failure with hypoxia: Secondary | ICD-10-CM | POA: Diagnosis present

## 2018-02-06 DIAGNOSIS — H109 Unspecified conjunctivitis: Secondary | ICD-10-CM | POA: Diagnosis present

## 2018-02-06 DIAGNOSIS — R0603 Acute respiratory distress: Secondary | ICD-10-CM | POA: Diagnosis not present

## 2018-02-06 DIAGNOSIS — Z6836 Body mass index (BMI) 36.0-36.9, adult: Secondary | ICD-10-CM | POA: Diagnosis not present

## 2018-02-06 DIAGNOSIS — Z933 Colostomy status: Secondary | ICD-10-CM | POA: Diagnosis not present

## 2018-02-06 DIAGNOSIS — G43909 Migraine, unspecified, not intractable, without status migrainosus: Secondary | ICD-10-CM | POA: Diagnosis present

## 2018-02-06 DIAGNOSIS — M419 Scoliosis, unspecified: Secondary | ICD-10-CM | POA: Diagnosis present

## 2018-02-06 DIAGNOSIS — J810 Acute pulmonary edema: Secondary | ICD-10-CM | POA: Diagnosis not present

## 2018-02-06 DIAGNOSIS — E785 Hyperlipidemia, unspecified: Secondary | ICD-10-CM | POA: Diagnosis present

## 2018-02-06 DIAGNOSIS — F410 Panic disorder [episodic paroxysmal anxiety] without agoraphobia: Secondary | ICD-10-CM | POA: Diagnosis present

## 2018-02-06 DIAGNOSIS — H409 Unspecified glaucoma: Secondary | ICD-10-CM | POA: Diagnosis present

## 2018-02-06 DIAGNOSIS — I5043 Acute on chronic combined systolic (congestive) and diastolic (congestive) heart failure: Secondary | ICD-10-CM | POA: Diagnosis present

## 2018-02-06 LAB — TSH: TSH: 2.826 u[IU]/mL (ref 0.350–4.500)

## 2018-02-06 MED ORDER — BUDESONIDE 0.25 MG/2ML IN SUSP
0.2500 mg | Freq: Two times a day (BID) | RESPIRATORY_TRACT | Status: DC
  Administered 2018-02-07 – 2018-02-13 (×13): 0.25 mg via RESPIRATORY_TRACT
  Filled 2018-02-06 (×14): qty 2

## 2018-02-06 MED ORDER — ALBUTEROL SULFATE (2.5 MG/3ML) 0.083% IN NEBU
2.5000 mg | INHALATION_SOLUTION | RESPIRATORY_TRACT | Status: DC
  Administered 2018-02-06 – 2018-02-09 (×18): 2.5 mg via RESPIRATORY_TRACT
  Filled 2018-02-06 (×15): qty 3

## 2018-02-06 NOTE — Plan of Care (Signed)

## 2018-02-06 NOTE — Plan of Care (Addendum)
Nutrition Education Note  RD consulted for nutrition education regarding CHF.  47 y.o. male with complex PMH including  HFpEF, HLD, HTN, Klippel-Feil syndrome with restrictive lung disease and h/o airway hyperreactivity, alopecia, migraine, h/o open angle glaucoma, h/o congenital hydrocephalus, and clinical depression on Paxil that presented with acute on chronic diastolic HF likely 2/2 uncontrolled HTN.  Met with pt in room today. Pt reports good appetite today and pta. Pt reports his appetite is good today; patient eating 75-100% of meals. Pt reports he was trying to eat healthier and had lost 60lbs but then his mother got sick and he has been taking care of her; pt reports that he has regained back the 60lbs. Pt reports that he hasn't been eating as healthy as he was but he still has not been eating much salt.  RD provided "Low Sodium Nutrition Therapy" handout from the Academy of [employer]. Reviewed patient's dietary recall. Provided examples on ways to decrease sodium intake in diet. Discouraged intake of processed foods and use of salt shaker. Encouraged fresh fruits and vegetables as well as whole grain sources of carbohydrates to maximize fiber intake.   RD discussed why it is important for patient to adhere to diet recommendations, and emphasized the role of fluids, foods to avoid, and importance of weighing self daily. Teach back method used.  Pt's girlfriend entered room at the end of the education and asked to speak with RD privately outside. Pt stated "I am not comfortable with you talking to my doctors outside". RD told patient's girlfriend that all questions and discussions would happen in patient's room in front of the patient. RD reiterated entire low sodium education to pt's girlfriend also.   Expect good compliance.  Body mass index is 36.41 kg/m. Pt meets criteria for obesity based on current BMI.  Current diet order is heart healthy, patient is consuming  approximately 75-100% of meals at this time. Labs and medications reviewed. No further nutrition interventions warranted at this time. RD contact information provided. If additional nutrition issues arise, please re-consult RD.   Koleen Distance MS, RD, LDN Pager #- [phone] Office#- [phone] After Hours Pager: [phone]

## 2018-02-06 NOTE — Progress Notes (Signed)
[place] at [place] NAME: Scott [name]    MR#:  [id number]  DATE OF BIRTH:  1970/04/26  SUBJECTIVE:seen at bedside.  Patient admitted for shortness of breath, acute on chronic diastolic heart failure due to uncontrolled hypertension.  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 Shortness of Breath  Patient is on IV Lasix, he feels better than yesterday.  However hypoxic, when the oxygen is turned off O2 saturation dropped to upper 80s.  REVIEW OF SYSTEMS:   ROS CONSTITUTIONAL: No fever, fatigue or weakness.  EYES: No blurred or double vision.  EARS, NOSE, AND THROAT: No tinnitus or ear pain.  RESPIRATORY: Cough, mild shortness of breath CARDIOVASCULAR: No chest pain, orthopnea, edema.  GASTROINTESTINAL: No nausea, vomiting, diarrhea or abdominal pain.  GENITOURINARY: No dysuria, hematuria.  ENDOCRINE: No polyuria, nocturia,  HEMATOLOGY: No anemia, easy bruising or bleeding SKIN: No rash or lesion. MUSCULOSKELETAL: No joint pain or arthritis.  Decreased leg edema. NEUROLOGIC: No tingling, numbness, weakness.  PSYCHIATRY: No anxiety or depression.   DRUG ALLERGIES:   Allergies  Allergen Reactions  . Codeine Shortness Of Breath    VITALS:  Blood pressure 127/79, pulse 80, temperature 97.9 F (36.6 C), temperature source Oral, resp. rate 20, height 5\' 2"  (1.575 m), weight 90.3 kg, SpO2 94 %.  PHYSICAL EXAMINATION:  GENERAL:  47 y.o.-year-old patient lying in the bed with no acute distress.   eYES: Pupils equal, round, reactive to light and accommodation. No scleral icterus. Extraocular muscles intact.  HEENT: Head atraumatic, normocephalic. Oropharynx and nasopharynx clear.  NECK:  Supple, no jugular venous distention. No thyroid enlargement, no tenderness.  LUNGS: Diminished air entry bilaterally but no wheeze or  rales.  CARDIOVASCULAR: S1, S2 normal. No murmurs, rubs, or gallops.  ABDOMEN: Soft, nontender, nondistended. Bowel sounds present. No organomegaly or mass.  EXTREMITIES: No pedal edema, cyanosis, or clubbing.  NEUROLOGIC: Cranial nerves II through XII are intact. Muscle strength 5/5 in all extremities. Sensation intact. Gait not checked.  PSYCHIATRIC: The patient is alert and oriented x 3.  SKIN: No obvious rash, lesion, or ulcer.    LABORATORY PANEL:   CBC Recent Labs  Lab 02/05/18 0557  WBC 9.1  HGB 14.3  HCT 46.1  PLT 286   ------------------------------------------------------------------------------------------------------------------  Chemistries  Recent Labs  Lab 02/04/18 2307 02/05/18 0557  NA 145 144  K 4.1 4.2  CL 106 102  CO2 34* 35*  GLUCOSE 100* 191*  BUN 20 18  CREATININE 0.90 0.94  CALCIUM 8.7* 8.8*  AST 37  --   ALT 52*  --   ALKPHOS 75  --   BILITOT 0.6  --    ------------------------------------------------------------------------------------------------------------------  Cardiac Enzymes Recent Labs  Lab 02/05/18 1433  TROPONINI 0.06*   ------------------------------------------------------------------------------------------------------------------  RADIOLOGY:  Dg Chest Port 1 View  Result Date: 02/04/2018 CLINICAL DATA:  Shortness of breath. EXAM: PORTABLE CHEST 1 VIEW COMPARISON:  December 21, 2016 FINDINGS: Surgical hardware seen in the scoliotic thoracic spine. Evaluation of the chest is limited due to the patient's scoliosis in positioning. The cardiomediastinal silhouette is stable. No pulmonary nodules or masses. No focal infiltrates. Mild pulmonary venous congestion not excluded. No overt edema. IMPRESSION: Cardiomegaly. Possible mild pulmonary venous congestion. No overt edema. Electronically Signed   By: Dorise Bullion III M.D   On: 02/04/2018 23:57    EKG:  Orders placed or performed during the hospital encounter of 02/04/18   . ED EKG  . ED EKG  . EKG 12-Lead  . EKG 12-Lead    ASSESSMENT AND PLAN:    47 yr old male with history of reactive airway disease, restrictive lung disease, congenital hydrocephalus comes in because of shortness of breath  1.acute on chronic diastolic heart failure; EF is more than 55%, seen by cardiology, decreased leg edema today and he feels better with IV Lasix however's still volume overloaded, Continue IV Lasix, O2 sat dropped without oxygen 83% this morning.  Continue oxygen, IV Lasix Cardio recommends continue lasix,lexiscan on wednesday 2.possible sleep apnea;'sleep study as an out pt. 3.troponin elevation; mild troponin elevation likely secondary to #1 #4 .accelerated hypertension, BP improved, continue IV Lasix, lisinopril, #5. history of restrictive lung disease: Continue bronchodilators. 6.  Depression: Continue Paxil. Likely discharge to home in the next 24 to 48 hours.  Need to wean off oxygen, continue Lasix, Lexiscan stress test tomorrow. All the records are reviewed and case discussed with Care Management/Social Workerr. Management plans discussed with the patient, family and they are in agreement.  CODE STATUS: Full code  TOTAL TIME TAKING CARE OF THIS PATIENT: 30 minutes.   POSSIBLE D/C IN 1-2 DAYS, DEPENDING ON CLINICAL CONDITION.   Epifanio Lesches M.D on 02/06/2018 at 6:57 PM  Between 7am to 6pm - Pager - [phone]  After 6pm go to [email or web address] - password [email or web address] [facility]  Office  [phone]  CC: Primary care physician; Margo Common, PA   Note: This dictation was prepared with Dragon dictation along with smaller phrase technology. Any transcriptional errors that result from this process are unintentional.

## 2018-02-06 NOTE — Progress Notes (Signed)
Reviewed Better Living with Heart Failure with patient and his girlfriend.  Viewed first EMMI video for CHF.

## 2018-02-06 NOTE — Progress Notes (Signed)
He takes his oxygen off to go to the bathroom.  His O2 sat on r/a/ returning from the BR was 83%.  I got extension for his O2 tubing so he could wear it while walking in the room.

## 2018-02-06 NOTE — Progress Notes (Signed)
Progress Note  Patient Name: Scott [name] Date of Encounter: 02/06/2018  Primary Cardiologist: [facility], Dr. Fletcher Anon  Subjective   Patient denies current CP, palpitations, racing heart rate. Ambulated earlier with some SOB d/t "problem with oxygen, now fixed."  He is joined by his girlfriend, [name], who has expressed concern about his h/o panic attacks. She also reported the patient's concern regarding his new diagnosis and we reviewed the echo results.   Telemetry significant for TWI  Inpatient Medications    Scheduled Meds: . albuterol  2.5 mg Nebulization Q4H  . brinzolamide  1 drop Both Eyes Daily  . bromocriptine  7.5 mg Oral Daily  . budesonide (PULMICORT) nebulizer solution  0.25 mg Nebulization BID  . enoxaparin (LOVENOX) injection  40 mg Subcutaneous Q24H  . furosemide  40 mg Intravenous BID  . latanoprost  1 drop Both Eyes QHS  . lisinopril  20 mg Oral Daily  . PARoxetine  40 mg Oral Daily   Continuous Infusions:  PRN Meds: acetaminophen **OR** acetaminophen, hydrALAZINE, labetalol, ondansetron **OR** ondansetron (ZOFRAN) IV   Vital Signs    Vitals:   02/05/18 2037 02/06/18 0028 02/06/18 0427 02/06/18 0750  BP: 112/64  (!) 94/59 126/73  Pulse: 78  70 75  Resp: 19  20   Temp: 98.4 F (36.9 C)  98.1 F (36.7 C) 97.8 F (36.6 C)  TempSrc: Oral  Oral Oral  SpO2: 98%  94% 97%  Weight:  90.3 kg    Height:        Intake/Output Summary (Last 24 hours) at 02/06/2018 1311 Last data filed at 02/06/2018 1203 Gross per 24 hour  Intake 360 ml  Output 700 ml  Net -340 ml   Filed Weights   02/04/18 2256 02/05/18 0334 02/06/18 0028  Weight: 86.2 kg 89.3 kg 90.3 kg    Telemetry    SR HR 70-90s,, TWI  - Personally Reviewed  ECG    No new tracings  Physical Exam   GEN: No acute distress.  Eating a hamburger Neck: JVP ~8cm Cardiac: RRR, extrasystole noted but otherwise regular, no murmurs, rubs, or gallops.  Respiratory: Bibasilar crackles. On [place] GI:  Soft, nontender, non-distended  MS: Mild 1+ b/l LEE. Congenital abnormalities of chest, L hand and neck noted.  Neuro:  Nonfocal  Psych: Normal affect   Labs    Chemistry Recent Labs  Lab 02/04/18 2307 02/05/18 0557  NA 145 144  K 4.1 4.2  CL 106 102  CO2 34* 35*  GLUCOSE 100* 191*  BUN 20 18  CREATININE 0.90 0.94  CALCIUM 8.7* 8.8*  PROT 7.2  --   ALBUMIN 3.6  --   AST 37  --   ALT 52*  --   ALKPHOS 75  --   BILITOT 0.6  --   GFRNONAA >60 >60  GFRAA >60 >60  ANIONGAP 5 7     Hematology Recent Labs  Lab 02/04/18 2307 02/05/18 0557  WBC 9.5 9.1  RBC 4.92 4.80  HGB 14.5 14.3  HCT 46.5 46.1  MCV 94.5 96.0  MCH 29.5 29.8  MCHC 31.2 31.0  RDW 13.4 13.6  PLT 329 286    Cardiac Enzymes Recent Labs  Lab 02/04/18 2307 02/05/18 0552 02/05/18 0847 02/05/18 1433  TROPONINI 0.06* 0.04* 0.03* 0.06*   No results for input(s): TROPIPOC in the last 168 hours.   BNP Recent Labs  Lab 02/04/18 2256  BNP 216.0*     DDimer No results for input(s):  DDIMER in the last 168 hours.   Radiology    Dg Chest Port 1 View  Result Date: 02/04/2018 CLINICAL DATA:  Shortness of breath. EXAM: PORTABLE CHEST 1 VIEW COMPARISON:  December 21, 2016 FINDINGS: Surgical hardware seen in the scoliotic thoracic spine. Evaluation of the chest is limited due to the patient's scoliosis in positioning. The cardiomediastinal silhouette is stable. No pulmonary nodules or masses. No focal infiltrates. Mild pulmonary venous congestion not excluded. No overt edema. IMPRESSION: Cardiomegaly. Possible mild pulmonary venous congestion. No overt edema. Electronically Signed   By: Dorise Bullion III M.D   On: 02/04/2018 23:57    Cardiac Studies   02/05/2018 TTE Left ventricle: The cavity size was normal. Wall thickness was   normal. Systolic function was normal. The estimated ejection   fraction was in the range of 55% to 60%. Wall motion was normal;   there were no regional wall motion  abnormalities. Doppler   parameters are consistent with abnormal left ventricular   relaxation (grade 1 diastolic dysfunction). - Left atrium: The atrium was normal in size. - Right ventricle: Systolic function was normal. - Pulmonary arteries: Systolic pressure could not be accurately   estimated.   Patient Profile     47 y.o. male with complex PMH including  HFpEF, HLD, HTN, Klippel-Feil syndrome with restrictive lung disease and h/o airway hyperreactivity, alopecia, migraine, h/o open angle glaucoma, h/o congenital hydrocephalus, and clinical depression on Paxil that presented with acute on chronic diastolic HF likely 2/2 uncontrolled HTN.  Assessment & Plan    1. Acute on chronic diastolic heart failure - Likely 2/2 uncontrolled HTN. Worsening orthopnea and PND. CXR showed cardiomegaly and mild pulmonary vascular congestion with BNP and troponin elevated. - 02/05/18 TTE as above in CV studies shows normal EF 55-60%, G1DD. Telemetry shows TWI. EKG shows NSR, LVH and associated repolarization abnormality.  - Mild troponin elevation likely in setting of acute on chronic diastolic heart failure d/t uncontrolled HTN. Per patient, he takes his medication as prescribed.  - Started on IV diuretics and oxygen with some improvement. Continue diuresis with monitoring of electrolytes and renal function.  - Monitor I/O, daily weights. Net -1252mL yesterday 11/3-11/4 - Daily BMP in setting of diuresis.  - Continue medical management with diuresis and antihypertensives: lasix 40mg  IV BID, hydralazine 10mg  IV q4h PRN, labetolol 10mg  IV q2h PRN, lisinopril 20mg  po qd - Heart healthy, low sodium diet  - Recommendation for future ischemic evaluation once better compensated    2. Troponin elevation - No current CP - As above, likely related to acute HF with accelerated HTN; however, cannot r/o ischemic etiology with recommendation for future ischemic evaluation once better compensated. - Troponin 0.04   0.03  0.06. Continue to cycle. At presentation, had chest pressure with associated SOB. No CP currently. TTE as above with normal EF, G1DD - Checking A1C, TSH. Potassium 4.2, Cr 0.94 (0.89 on 04/2016); Hgb 14.3 - On Lovenox 40mg  subcutaneous injection. Consider addition of ASA 81mg . Checking lipid panel and will consider addition of statin if needed. Continue BP control with medical management as above in X1.  3. HTN - Currently controlled at 126/73 - Continue medical management as above with furosemide 40mg  IV BID and antihypertensives as listed in #1. Continue to monitor vitals. At discharge recommend home BP checks, follow-up with PCP. Lifestyle changes to diet, exercise.   4. HLD - Recommend updated lipid panel with LDL goal <70 - If LDL elevated, consider statin.  5. Depression -  Continue Paxil - Per girlfriend, h/o panic attacks - Per IM  6. Klippel-Feil syndrome with restrictive lung disease and h/o airway hyperreactivity - Continue breathing tx, O2 as needed - Home inhalers documented  - Per IM   For questions or updates, please contact [place] Please consult [email or web address] for contact info under        Signed, Arvil Chaco, PA-C  02/06/2018, 1:11 PM

## 2018-02-07 ENCOUNTER — Ambulatory Visit: Payer: Medicare Other

## 2018-02-07 DIAGNOSIS — I10 Essential (primary) hypertension: Secondary | ICD-10-CM

## 2018-02-07 DIAGNOSIS — R7989 Other specified abnormal findings of blood chemistry: Secondary | ICD-10-CM

## 2018-02-07 LAB — HEMOGLOBIN A1C
Hgb A1c MFr Bld: 5.7 % — ABNORMAL HIGH (ref 4.8–5.6)
Mean Plasma Glucose: 116.89 mg/dL

## 2018-02-07 LAB — BASIC METABOLIC PANEL
Anion gap: 6 (ref 5–15)
BUN: 35 mg/dL — AB (ref 6–20)
CHLORIDE: 97 mmol/L — AB (ref 98–111)
CO2: 41 mmol/L — ABNORMAL HIGH (ref 22–32)
Calcium: 8.9 mg/dL (ref 8.9–10.3)
Creatinine, Ser: 1.28 mg/dL — ABNORMAL HIGH (ref 0.61–1.24)
GFR calc Af Amer: >60 mL/min (ref >60)
GFR calc non Af Amer: >60 mL/min (ref >60)
GLUCOSE: 94 mg/dL (ref 70–99)
Potassium: 3.7 mmol/L (ref 3.5–5.1)
Sodium: 144 mmol/L (ref 135–145)

## 2018-02-07 LAB — LIPID PANEL
CHOL/HDL RATIO: 2.8 ratio
Cholesterol: 155 mg/dL (ref 0–200)
HDL: 55 mg/dL (ref >40)
LDL CALC: 85 mg/dL (ref 0–99)
TRIGLYCERIDES: 74 mg/dL (ref <150)
VLDL: 15 mg/dL (ref 0–40)

## 2018-02-07 LAB — HIV ANTIBODY (ROUTINE TESTING W REFLEX): HIV Screen 4th Generation wRfx: NONREACTIVE

## 2018-02-07 MED ORDER — POTASSIUM CHLORIDE CRYS ER 20 MEQ PO TBCR
40.0000 meq | EXTENDED_RELEASE_TABLET | Freq: Once | ORAL | Status: AC
  Administered 2018-02-07: 40 meq via ORAL
  Filled 2018-02-07: qty 2

## 2018-02-07 MED ORDER — ASPIRIN EC 81 MG PO TBEC
81.0000 mg | DELAYED_RELEASE_TABLET | Freq: Every day | ORAL | Status: DC
  Administered 2018-02-07 – 2018-02-13 (×7): 81 mg via ORAL
  Filled 2018-02-07 (×7): qty 1

## 2018-02-07 NOTE — Progress Notes (Signed)
[name]    MR#:  [id number]  DATE OF BIRTH:  24-Oct-1970  SUBJECTIVE:   Patient here due to shortness of breath and noted to be in congestive heart failure.  Still appears to be somewhat volume overload currently being diuresed with IV Lasix.  Cardiology planning for nuclear medicine stress test in the morning.  Patient denies any chest pain or worsening shortness of breath.  He is having some nausea and dry heaving this morning.  REVIEW OF SYSTEMS:    Review of Systems  Constitutional: Negative for chills and fever.  HENT: Negative for congestion and tinnitus.   Eyes: Negative for blurred vision and double vision.  Respiratory: Negative for cough, shortness of breath and wheezing.   Cardiovascular: Negative for chest pain, orthopnea and PND.  Gastrointestinal: Positive for nausea and vomiting. Negative for abdominal pain and diarrhea.  Genitourinary: Negative for dysuria and hematuria.  Neurological: Negative for dizziness, sensory change and focal weakness.  All other systems reviewed and are negative.   Nutrition: Heart Healthy Tolerating Diet: Yes Tolerating PT: Await Eval.    DRUG ALLERGIES:   Allergies  Allergen Reactions  . Codeine Shortness Of Breath    VITALS:  Blood pressure (!) 93/57, pulse 77, temperature 98.8 F (37.1 C), temperature source Oral, resp. rate 18, height 5\' 2"  (1.575 m), weight 88.7 kg, SpO2 95 %.  PHYSICAL EXAMINATION:   Physical Exam  GENERAL:  47 y.o.-year-old patient lying in bed in no acute distress.  EYES: Pupils equal, round, reactive to light and accommodation. No scleral icterus. Extraocular muscles intact.  HEENT: Head atraumatic, normocephalic. Oropharynx and nasopharynx clear.  NECK:  Supple, no jugular venous distention. No thyroid enlargement, no tenderness.  LUNGS: Normal breath sounds bilaterally, no wheezing, bibasilar rales, No rhonchi. No use of  accessory muscles of respiration.  CARDIOVASCULAR: S1, S2 normal. No murmurs, rubs, or gallops.  ABDOMEN: Soft, nontender, nondistended. Bowel sounds present. No organomegaly or mass.  EXTREMITIES: No cyanosis, clubbing or edema b/l.   Deformities on hands due to previous Klippel-Feil syndrome.  NEUROLOGIC: Cranial nerves II through XII are intact. No focal Motor or sensory deficits b/l.   PSYCHIATRIC: The patient is alert and oriented x 3.  SKIN: No obvious rash, lesion, or ulcer.    LABORATORY PANEL:   CBC Recent Labs  Lab 02/05/18 0557  WBC 9.1  HGB 14.3  HCT 46.1  PLT 286   ------------------------------------------------------------------------------------------------------------------  Chemistries  Recent Labs  Lab 02/04/18 2307  02/07/18 0508  NA 145   < > 144  K 4.1   < > 3.7  CL 106   < > 97*  CO2 34*   < > 41*  GLUCOSE 100*   < > 94  BUN 20   < > 35*  CREATININE 0.90   < > 1.28*  CALCIUM 8.7*   < > 8.9  AST 37  --   --   ALT 52*  --   --   ALKPHOS 75  --   --   BILITOT 0.6  --   --    < > = values in this interval not displayed.   ------------------------------------------------------------------------------------------------------------------  Cardiac Enzymes Recent Labs  Lab 02/05/18 1433  TROPONINI 0.06*   ------------------------------------------------------------------------------------------------------------------  RADIOLOGY:  No results found.   ASSESSMENT AND PLAN:   47 year old male with past medical history of chronic diastolic CHF, hypertension, Klippel-Feil syndrome with restrictive  lung disease and history of migraines, glaucoma, congenital hydrocephalus who presented to the hospital due to shortness of breath.  1.  Acute on chronic diastolic CHF- this is a cause of patient's worsening shortness of breath. -Continue diuresis with IV Lasix, she has about 1 L negative since admission. -She has lost about 5 pounds since IV diuresis.   Continue lisinopril  2.  Elevated troponin- mild troponin elevation, seen by cardiology, plan for functional study tomorrow. -Patient has no acute chest pain.  Continue supportive care.  3.  Essential hypertension-continue lisinopril.  4.  Glaucoma-continue Azopt, latanoprost eyedrops   All the records are reviewed and case discussed with Care Management/Social Worker. Management plans discussed with the patient, family and they are in agreement.  CODE STATUS: Full code  DVT Prophylaxis: Lovenox  TOTAL TIME TAKING CARE OF THIS PATIENT: 30 minutes.   POSSIBLE D/C IN 2-3 DAYS, DEPENDING ON CLINICAL CONDITION.   Henreitta Leber M.D on 02/07/2018 at 4:33 PM  Between 7am to 6pm - Pager - [phone]  After 6pm go to [email or web address] - [facility]  Office  [phone]  CC: Primary care physician; [name], [name], PA

## 2018-02-07 NOTE — Plan of Care (Signed)

## 2018-02-07 NOTE — Progress Notes (Signed)
Progress Note  Patient Name: Scott [name] Date of Encounter: 02/07/2018  Primary Cardiologist: [facility]ston Hole  Subjective   Scott [name] continues to have shortness of breath and orthopnea, somewhat improved from yesterday.  He had nausea and emesis this morning after eating breakfast.  Symptoms have improved.  No chest pain or palpitations.  He reports good urine output yesterday, which was incompletely recorded.  Inpatient Medications    Scheduled Meds: . albuterol  2.5 mg Nebulization Q4H  . brinzolamide  1 drop Both Eyes Daily  . bromocriptine  7.5 mg Oral Daily  . budesonide (PULMICORT) nebulizer solution  0.25 mg Nebulization BID  . enoxaparin (LOVENOX) injection  40 mg Subcutaneous Q24H  . furosemide  40 mg Intravenous BID  . latanoprost  1 drop Both Eyes QHS  . lisinopril  20 mg Oral Daily  . PARoxetine  40 mg Oral Daily  . potassium chloride  40 mEq Oral Once   Continuous Infusions:  PRN Meds: acetaminophen **OR** acetaminophen, hydrALAZINE, labetalol, ondansetron **OR** ondansetron (ZOFRAN) IV   Vital Signs    Vitals:   02/07/18 0415 02/07/18 0806 02/07/18 0836 02/07/18 1212  BP: 126/72  131/74   Pulse: 71  82   Resp:   18   Temp: 98.4 F (36.9 C)  (!) 97.5 F (36.4 C)   TempSrc: Oral  Oral   SpO2: 99% 99% 97% 93%  Weight: 88.7 kg     Height:        Intake/Output Summary (Last 24 hours) at 02/07/2018 1301 Last data filed at 02/07/2018 1020 Gross per 24 hour  Intake 360 ml  Output 0 ml  Net 360 ml   Filed Weights   02/05/18 0334 02/06/18 0028 02/07/18 0415  Weight: 89.3 kg 90.3 kg 88.7 kg    Telemetry    Normal sinus rhythm with PACs - Personally Reviewed  ECG    No new tracing - Personally Reviewed  Physical Exam   GEN: No acute distress.   Neck:  JVP approximately 8 cm. Cardiac: RRR, no murmurs, rubs, or gallops.  Respiratory:  Normal work of breathing.  Bibasilar crackles. GI:  Firm and mildly distended. MS:  2+ pitting edema to the  proximal calves. Neuro:  Nonfocal  Psych: Normal affect   Labs    Chemistry Recent Labs  Lab 02/04/18 2307 02/05/18 0557 02/07/18 0508  NA 145 144 144  K 4.1 4.2 3.7  CL 106 102 97*  CO2 34* 35* 41*  GLUCOSE 100* 191* 94  BUN 20 18 35*  CREATININE 0.90 0.94 1.28*  CALCIUM 8.7* 8.8* 8.9  PROT 7.2  --   --   ALBUMIN 3.6  --   --   AST 37  --   --   ALT 52*  --   --   ALKPHOS 75  --   --   BILITOT 0.6  --   --   GFRNONAA >60 >60 >60  GFRAA >60 >60 >60  ANIONGAP 5 7 6      Hematology Recent Labs  Lab 02/04/18 2307 02/05/18 0557  WBC 9.5 9.1  RBC 4.92 4.80  HGB 14.5 14.3  HCT 46.5 46.1  MCV 94.5 96.0  MCH 29.5 29.8  MCHC 31.2 31.0  RDW 13.4 13.6  PLT 329 286    Cardiac Enzymes Recent Labs  Lab 02/04/18 2307 02/05/18 0552 02/05/18 0847 02/05/18 1433  TROPONINI 0.06* 0.04* 0.03* 0.06*   No results for input(s): TROPIPOC in the last 168  hours.   BNP Recent Labs  Lab 02/04/18 2256  BNP 216.0*     DDimer No results for input(s): DDIMER in the last 168 hours.   Radiology    No results found.  Cardiac Studies   Echo (02/05/18): - Left ventricle: The cavity size was normal. Wall thickness was   normal. Systolic function was normal. The estimated ejection   fraction was in the range of 55% to 60%. Wall motion was normal;   there were no regional wall motion abnormalities. Doppler   parameters are consistent with abnormal left ventricular   relaxation (grade 1 diastolic dysfunction). - Left atrium: The atrium was normal in size. - Right ventricle: Systolic function was normal. - Pulmonary arteries: Systolic pressure could not be accurately   estimated.  Patient Profile     47 y.o. male with history of HFpEF, HLD, HTN, Klippel-Feil syndrome with restrictive lung disease and h/o airway hyperreactivity, alopecia, migraine, h/o open angle glaucoma, h/o congenital hydrocephalus, and clinical depression on Paxil that presented with acute on chronic  diastolic HF likely 2/2 uncontrolled HTN  Assessment & Plan    Acute on chronic diastolic heart failure Scott [name] continues to appear euvolemic and reports symptoms consistent with NYHA class IV heart failure.  I's and O's are not well recorded, but he lost almost 5 pounds in the last 24 hours, suggesting aggressive diuresis.  BUN and creatinine are both modestly up compared to 2 days ago.  Decrease furosemide to 40 mg IV daily, given worsening renal function.  Elevated troponin Mild and nonspecific.  Myocardial perfusion stress test previously recommended by Dr. Fletcher Anon but deferred until orthopnea improves.  Continue diuresis, as above.  If patient is able to lie flat comfortably tomorrow morning, we will proceed with pharmacologic myocardial perfusion stress test.  I will make him n.p.o. after midnight in preparation for this.  Start aspirin 81 mg daily.  Defer adding statin unless evidence of CAD by stress test/catheterization.  LDL this morning quite good at 85.  Hypertension Blood pressure normal.  Continue lisinopril 20 mg daily.  For questions or updates, please contact [place] Please consult [email or web address] for contact info under [facility].     Signed, Nelva Bush, MD  02/07/2018, 1:01 PM

## 2018-02-08 DIAGNOSIS — R0603 Acute respiratory distress: Secondary | ICD-10-CM

## 2018-02-08 DIAGNOSIS — Q761 Klippel-Feil syndrome: Secondary | ICD-10-CM

## 2018-02-08 DIAGNOSIS — I248 Other forms of acute ischemic heart disease: Secondary | ICD-10-CM

## 2018-02-08 DIAGNOSIS — J810 Acute pulmonary edema: Secondary | ICD-10-CM

## 2018-02-08 LAB — BASIC METABOLIC PANEL
ANION GAP: 9 (ref 5–15)
BUN: 31 mg/dL — AB (ref 6–20)
CO2: 37 mmol/L — AB (ref 22–32)
Calcium: 8.5 mg/dL — ABNORMAL LOW (ref 8.9–10.3)
Chloride: 98 mmol/L (ref 98–111)
Creatinine, Ser: 1.19 mg/dL (ref 0.61–1.24)
GFR calc Af Amer: >60 mL/min (ref >60)
GLUCOSE: 86 mg/dL (ref 70–99)
Potassium: 4.6 mmol/L (ref 3.5–5.1)
Sodium: 144 mmol/L (ref 135–145)

## 2018-02-08 MED ORDER — FUROSEMIDE 10 MG/ML IJ SOLN
40.0000 mg | Freq: Every day | INTRAMUSCULAR | Status: DC
  Administered 2018-02-09: 40 mg via INTRAVENOUS
  Filled 2018-02-08: qty 4

## 2018-02-08 MED ORDER — FUROSEMIDE 10 MG/ML IJ SOLN
40.0000 mg | Freq: Once | INTRAMUSCULAR | Status: AC
  Administered 2018-02-08: 40 mg via INTRAVENOUS
  Filled 2018-02-08: qty 4

## 2018-02-08 NOTE — Plan of Care (Signed)
  Problem: Education: Goal: Knowledge of General Education information will improve Description Including pain rating scale, medication(s)/side effects and non-pharmacologic comfort measures Outcome: Progressing   Problem: Health Behavior/Discharge Planning: Goal: Ability to manage health-related needs will improve Outcome: Progressing   Problem: Education: Goal: Ability to demonstrate management of disease process will improve Outcome: Progressing   Pt encouraged to watch EMI Heart Failure Videos, pt agreed

## 2018-02-08 NOTE — Progress Notes (Signed)
Cardiovascular Nurse Navigator Note:    47 year old with PMHx of chronic diastolic CHF, HTN, Klippel-Feil syndrome with restrictive lung disease and history of migraines, glaucoma, congential hydrocephalus who presented to the ED c/o SOB, orthopnea, dyspnea, leg swelling, and abdominal distention.    Patient admitted with dx of acute on chronic diastolic CHF and elevated troponin.  Plan is for patient to have a nuclear stress test now that his SOB has improved.    Rounded on patient to provide /reinforce CHF education.  Patient sitting up on the side eating small bag of L[employer] potato chips when this nurse entered.  Patient's girlfriend was at bedside.  Patient gave this RN verbal permission to speak about his health in front of his girlfriend.    CHF Education:? Educational session with patient completed.  Reviewed "Living Better with Heart Failure" packet. Briefly reviewed definition of heart failure and signs and symptoms of an exacerbation.?Explained to patient/girlfriend that HF is a chronic illness which requires self-assessment / self-management along with help from the cardiologist/PCP.?? ? *Reviewed importance of and reason behind checking weight daily in the AM, after using the bathroom, but before getting dressed. Patient will need scales.  Will inform RNCM.   ? *Reviewed with patient the following information: *Discussed when to call the Dr= weight gain of >2-3lb overnight of 5lb in a week,  *Discussed yellow zone= call MD: weight gain of >2-3lb overnight of 5lb in a week, increased swelling, increased SOB when lying down, chest discomfort, dizziness, increased fatigue *Red Zone= call 911: struggle to breath, fainting or near fainting, significant chest pain   *Diet - Reviewed low sodium diet-provided handout of recommended and not recommended foods.  Dietitian Consultation for education completed on 02/06/2018.   ? *Discussed fluid intake with patient as well. Patient not currently on a  fluid restriction, but advised no more than 8-8 ounces glass of fluids per day.?Explained to patient if he drinks more than he is urinating and is eating foods high in sodium, then his body will retain fluid.   ?  *Instructed patient to take medications as prescribed for heart failure. Explained briefly why pt is on the medications (either make you feel better, live longer or keep you out of the hospital) and discussed monitoring and side effects. Currently patient is on IV lasix and lisinopril.   ? *Discussed exercise / activity.  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  Patient stated, "After I started caring for my mother, I was no longer abnursing le to work out, so I just kinda let things go."   Patient informed this RN that his mother is now in a home.    ? *Smoking Cessation- Patient is a former smoker.? ? *[facility] - Explained the purpose of the HF Clinic. ?Explained to patient the HF Clinic does not replace PCP nor Cardiologist, but is an additional resource to helping patient manage heart failure at home. Patient's appointment was originally scheduled at 0800.  Patient requested the appointment be changed to a later time for after 11:00 a.m., as 0800 is too early for him.  Appointment changed to 02/16/2018 at 12 noon.  Patient informed of new date and time.    Additional handouts from the [employer](PCNA) given and reviewed with patient:  1.Enjoying Life While Managing Heart Failure: Understanding the Diagnosis 2. Enjoying Life While Managing Heart Failure: Common  Heart Failure Medications, Treatments, and Interventions.  3. Enjoying Life While Managing Heart Failure: The Sodium in Your Food / [place].  4. Enjoying Life While Managing Heart Failure: Get Walking 5. Enjoying Life While Managing Heart Failure: Your Emotions, Managing Stress, and  Alcohol and Tobacco Abuse ? Will follow-up with patient tomorrow to reinforce above information and answer any questions.   ? Patient / girlfriend thanked me for providing the above information. ? ? Roanna Epley, RN, BSN, [facility]? [facility]  Cardiovascular &?Pulmonary Nurse Navigator  Direct Line: [phone]  Department Phone #: [phone] Fax: [phone]? Email Address: [email or web address] ?  ?

## 2018-02-08 NOTE — Progress Notes (Addendum)
Progress Note  Patient Name: Scott [name] Date of Encounter: 02/08/2018  Primary Cardiologist: [facility]- Dr. Fletcher Anon  Subjective   Patient denies current CP, palpitations, or feeling of racing HR.  He still feels SOB and reports orthopnea.   Rescheduled today's 11/6 Lexiscan to tomorrow 02/09/2018. Patient reports he is still unable to lay flat / orthopnea and nuclear medicine also stated they were having machine technical difficulties.   Plan for continued diuresis with decreased dosage of Lasix at 40IV daily and follow-up BMET in AM. NPO after midnight with plan for Lexiscan for tomorrow with improved orthopnea after continued overnight diuresis.   I/O Net -700 today  HR 80s BP 134/67 Cr 1.28  1.19 K 4.6 (monitor)  Inpatient Medications    Scheduled Meds: . albuterol  2.5 mg Nebulization Q4H  . aspirin EC  81 mg Oral Daily  . brinzolamide  1 drop Both Eyes Daily  . bromocriptine  7.5 mg Oral Daily  . budesonide (PULMICORT) nebulizer solution  0.25 mg Nebulization BID  . enoxaparin (LOVENOX) injection  40 mg Subcutaneous Q24H  . latanoprost  1 drop Both Eyes QHS  . lisinopril  20 mg Oral Daily  . PARoxetine  40 mg Oral Daily   Continuous Infusions:  PRN Meds: acetaminophen **OR** acetaminophen, hydrALAZINE, labetalol, ondansetron **OR** ondansetron (ZOFRAN) IV   Vital Signs    Vitals:   02/08/18 0407 02/08/18 0513 02/08/18 0750 02/08/18 0929  BP:  135/74  131/74  Pulse:  82  82  Resp:  18    Temp:  98.7 F (37.1 C)  98.6 F (37 C)  TempSrc:  Oral  Oral  SpO2: 98% 100% 98% 100%  Weight:  89.2 kg    Height:        Intake/Output Summary (Last 24 hours) at 02/08/2018 1234 Last data filed at 02/08/2018 1000 Gross per 24 hour  Intake 0 ml  Output 700 ml  Net -700 ml   Filed Weights   02/06/18 0028 02/07/18 0415 02/08/18 0513  Weight: 90.3 kg 88.7 kg 89.2 kg    Telemetry    Normal sinus rhythm with PACs, HR 70-80s - Personally Reviewed  ECG    No new  tracing - Personally Reviewed  Physical Exam   GEN: No acute distress.  Accompanied by girlfriend Neck:  JVP approximately 8 cm. Cardiac: RRR, no murmurs, rubs, or gallops.  Respiratory:  Bibasilar trace crackles. GI:  Firm and mildly distended. MS: improved 1+ bilateral lower extremity edema  Neuro:  Nonfocal  Psych: Normal affect   Labs    Chemistry Recent Labs  Lab 02/04/18 2307 02/05/18 0557 02/07/18 0508 02/08/18 0505  NA 145 144 144 144  K 4.1 4.2 3.7 4.6  CL 106 102 97* 98  CO2 34* 35* 41* 37*  GLUCOSE 100* 191* 94 86  BUN 20 18 35* 31*  CREATININE 0.90 0.94 1.28* 1.19  CALCIUM 8.7* 8.8* 8.9 8.5*  PROT 7.2  --   --   --   ALBUMIN 3.6  --   --   --   AST 37  --   --   --   ALT 52*  --   --   --   ALKPHOS 75  --   --   --   BILITOT 0.6  --   --   --   GFRNONAA >60 >60 >60 >60  GFRAA >60 >60 >60 >60  ANIONGAP 5 7 6 9      Hematology  Recent Labs  Lab 02/04/18 2307 02/05/18 0557  WBC 9.5 9.1  RBC 4.92 4.80  HGB 14.5 14.3  HCT 46.5 46.1  MCV 94.5 96.0  MCH 29.5 29.8  MCHC 31.2 31.0  RDW 13.4 13.6  PLT 329 286    Cardiac Enzymes Recent Labs  Lab 02/04/18 2307 02/05/18 0552 02/05/18 0847 02/05/18 1433  TROPONINI 0.06* 0.04* 0.03* 0.06*   No results for input(s): TROPIPOC in the last 168 hours.   BNP Recent Labs  Lab 02/04/18 2256  BNP 216.0*     DDimer No results for input(s): DDIMER in the last 168 hours.   Radiology    No results found.  Cardiac Studies   Echo (02/05/18): - Left ventricle: The cavity size was normal. Wall thickness was   normal. Systolic function was normal. The estimated ejection   fraction was in the range of 55% to 60%. Wall motion was normal;   there were no regional wall motion abnormalities. Doppler   parameters are consistent with abnormal left ventricular   relaxation (grade 1 diastolic dysfunction). - Left atrium: The atrium was normal in size. - Right ventricle: Systolic function was normal. -  Pulmonary arteries: Systolic pressure could not be accurately   estimated.  Patient Profile     47 y.o. male with history of HFpEF, HLD, HTN, Klippel-Feil syndrome with restrictive lung disease and h/o airway hyperreactivity, alopecia, migraine, h/o open angle glaucoma, h/o congenital hydrocephalus, and clinical depression on Paxil that presented with acute on chronic diastolic HF likely 2/2 uncontrolled HTN  Assessment & Plan    Acute on chronic diastolic heart failure - Likely 2/2 uncontrolled HTN. Worsening orthopnea and PND. CXR showed cardiomegaly and mild pulmonary vascular congestion with BNP and troponin elevated. - CV studies as above with EF 55-60% and G1DD; CXR showed mild pulmonary vascular congestion.  - Lasix decreased to 40mg  IV daily d/t bump in Cr. Today, Cr improved 1.28  1.19 - I/O as above in subjective - Continue O2 for SOB. Continue to monitor I/O, daily weights.   Elevated troponin - No current CP - EKG shows NSR, LVH and associated abnormal repolarization. Mild troponin elevation in the setting of acute on chronic diastolic HFpEF likely d/t uncontrolled HTN but cannot rule out cardiac etiology pending stress test at this time.  - Continue hydralazine, labetolol, lisinopril. Started on ASA 81mg . LDL 81, consider addition of statin pending stress test or if cath and prior to discharge. - Pending stress test. NPO after midnight with lexiscan tomorrow as long as patient can lie flat tomorrow morning. Further recommendations pending results of myocardial perfusion stress test   HTN - BP controlled at 134/67 - Medical management with antihypertensives as above - Continue to monitor   Remainder per IM  For questions or updates, please contact [facility] Please consult [email or web address] for contact info under [facility] Card[facility].     Signed, Arvil Chaco, PA-C  02/08/2018, 12:34 PM     Attending Note Patient seen and examined, agree with detailed note above,   Patient presentation and plan discussed on rounds.   Still feels short of breath, not at his baseline Severe symptoms over the past week but reports it has been coming on for several months No chest pain or near syncope Had IV Lasix this morning Unable to lay supine today for stress test, scheduled for tomorrow  On physical exam laying 45 degrees, mild shortness of breath, unable to estimate JVD, on lung exam Rales at the bases,  heart sounds regular F5-O2 5-1/8 systolic ejection murmur left sternal border, abdomen soft nontender, no significant lower extremity edema, neurological and musculoskeletal exam grossly nonfocal  Review of Systems  Constitutional: Negative.   Respiratory: Positive for shortness of breath.   Cardiovascular: Positive for leg swelling.  Gastrointestinal: Negative.   Musculoskeletal: Negative.   Neurological: Negative.   Psychiatric/Behavioral: Negative.   All other systems reviewed and are negative.  Lab work reviewed showing sodium 144 potassium 4.6 creatinine 1.19 total cholesterol 155 globin A1c 5.7  A/P: Acute on chronic diastolic heart failure Will give additional dose of IV Lasix this evening, reports he has not urinated very much, difficulty laying flat/supine Poor diet at baseline, uncontrolled hypertension  CXR showed cardiomegaly and mild pulmonary vascular congestion with BNP and troponin elevated.  EF 55-60% and G1DD; CXR showed mild pulmonary vascular congestion.    Elevated troponin Likely demand ischemia in the setting of CHF, hypertension - Pending stress test tomorrow Thursday.  NPO after midnight with lexiscan    HTN Blood pressure stable 984 systolic   Greater than 21% was spent in counseling and coordination of care with patient Total encounter time 25 minutes or more   Signed: Esmond Plants  M.D., Ph.D. [facility]

## 2018-02-08 NOTE — Progress Notes (Signed)
Scott [name] at [place] NAME: Scott [name]    MR#:  [id number]  DATE OF BIRTH:  [birth year]  SUBJECTIVE:   Shortness of breath has improved, denies any nausea or vomiting today.  Plan was for a nuclear medicine stress test today but patient could not lie flat for the stress test effort scheduled for tomorrow.  Continue diuresis with IV Lasix for today.  REVIEW OF SYSTEMS:    Review of Systems  Constitutional: Negative for chills and fever.  HENT: Negative for congestion and tinnitus.   Eyes: Negative for blurred vision and double vision.  Respiratory: Negative for cough, shortness of breath and wheezing.   Cardiovascular: Negative for chest pain, orthopnea and PND.  Gastrointestinal: Negative for abdominal pain, diarrhea, nausea and vomiting.  Genitourinary: Negative for dysuria and hematuria.  Neurological: Negative for dizziness, sensory change and focal weakness.  All other systems reviewed and are negative.   Nutrition: Heart Healthy Tolerating Diet: Yes Tolerating PT: Await Eval.    DRUG ALLERGIES:   Allergies  Allergen Reactions  . Codeine Shortness Of Breath    VITALS:  Blood pressure 134/67, pulse 80, temperature 97.9 F (36.6 C), temperature source Oral, resp. rate 18, height 5\' 2"  (1.575 m), weight 89.2 kg, SpO2 99 %.  PHYSICAL EXAMINATION:   Physical Exam  GENERAL:  47 y.o.-year-old patient lying in bed in no acute distress.  EYES: Pupils equal, round, reactive to light and accommodation. No scleral icterus. Extraocular muscles intact.  HEENT: Head atraumatic, normocephalic. Oropharynx and nasopharynx clear.  NECK:  Supple, no jugular venous distention. No thyroid enlargement, no tenderness.  LUNGS: Normal breath sounds bilaterally, no wheezing, bibasilar rales, No rhonchi. No use of accessory muscles of respiration.  CARDIOVASCULAR: S1, S2 normal. No murmurs, rubs, or gallops.  ABDOMEN: Soft, nontender,  nondistended. Bowel sounds present. No organomegaly or mass.  EXTREMITIES: No cyanosis, clubbing or edema b/l.   Deformities on hands due to previous Klippel-Feil syndrome.  NEUROLOGIC: Cranial nerves II through XII are intact. No focal Motor or sensory deficits b/l.   PSYCHIATRIC: The patient is alert and oriented x 3.  SKIN: No obvious rash, lesion, or ulcer.    LABORATORY PANEL:   CBC Recent Labs  Lab 02/05/18 0557  WBC 9.1  HGB 14.3  HCT 46.1  PLT 286   ------------------------------------------------------------------------------------------------------------------  Chemistries  Recent Labs  Lab 02/04/18 2307  02/08/18 0505  NA 145   < > 144  K 4.1   < > 4.6  CL 106   < > 98  CO2 34*   < > 37*  GLUCOSE 100*   < > 86  BUN 20   < > 31*  CREATININE 0.90   < > 1.19  CALCIUM 8.7*   < > 8.5*  AST 37  --   --   ALT 52*  --   --   ALKPHOS 75  --   --   BILITOT 0.6  --   --    < > = values in this interval not displayed.   ------------------------------------------------------------------------------------------------------------------  Cardiac Enzymes Recent Labs  Lab 02/05/18 1433  TROPONINI 0.06*   ------------------------------------------------------------------------------------------------------------------  RADIOLOGY:  No results found.   ASSESSMENT AND PLAN:   47 year old male with past medical history of chronic diastolic CHF, hypertension, Klippel-Feil syndrome with restrictive lung disease and history of migraines, glaucoma, congenital hydrocephalus who presented to the hospital due to shortness of breath.  1.  Acute  on chronic diastolic CHF- this is the cause of patient's worsening shortness of breath. -Continue diuresis with IV Lasix, she has about 1.7 L negative since admission. - cont. Lisinopril.   2.  Elevated troponin- mild troponin elevation, seen by cardiology, plan was for nuclear medicine stress test today but it was postponed as patient  could not lie flat, plan for stress test tomorrow. -Patient has no acute chest pain.  Continue supportive care.  3.  Essential hypertension-continue lisinopril.  4.  Glaucoma-continue Azopt, latanoprost eyedrops   All the records are reviewed and case discussed with Care Management/Social Worker. Management plans discussed with the patient, family and they are in agreement.  CODE STATUS: Full code  DVT Prophylaxis: Lovenox  TOTAL TIME TAKING CARE OF THIS PATIENT: 25 minutes.   POSSIBLE D/C IN 2-3 DAYS, DEPENDING ON CLINICAL CONDITION.   Henreitta Leber M.D on 02/08/2018 at 4:30 PM  Between 7am to 6pm - Pager - [phone]  After 6pm go to [email or web address] - [facility]  Office  [phone]  CC: Primary care physician; [name], [name], PA

## 2018-02-09 ENCOUNTER — Encounter: Payer: Self-pay | Admitting: Radiology

## 2018-02-09 ENCOUNTER — Inpatient Hospital Stay (HOSPITAL_COMMUNITY): Payer: Medicare Other

## 2018-02-09 DIAGNOSIS — R079 Chest pain, unspecified: Secondary | ICD-10-CM

## 2018-02-09 DIAGNOSIS — I501 Left ventricular failure: Secondary | ICD-10-CM

## 2018-02-09 LAB — BASIC METABOLIC PANEL
ANION GAP: 11 (ref 5–15)
BUN: 22 mg/dL — AB (ref 6–20)
CHLORIDE: 95 mmol/L — AB (ref 98–111)
CO2: 35 mmol/L — AB (ref 22–32)
Calcium: 9.1 mg/dL (ref 8.9–10.3)
Creatinine, Ser: 1 mg/dL (ref 0.61–1.24)
GFR calc Af Amer: >60 mL/min (ref >60)
GLUCOSE: 120 mg/dL — AB (ref 70–99)
POTASSIUM: 3.7 mmol/L (ref 3.5–5.1)
Sodium: 141 mmol/L (ref 135–145)

## 2018-02-09 LAB — NM MYOCAR MULTI W/SPECT W/WALL MOTION / EF
CHL CUP MPHR: 173 {beats}/min
CHL CUP NUCLEAR SDS: 0
CHL CUP RESTING HR STRESS: 84 {beats}/min
CSEPED: 0 min
CSEPEW: 1 METS
CSEPHR: 56 %
Exercise duration (sec): 0 s
LV dias vol: 122 mL (ref 62–150)
LV sys vol: 58 mL
Peak HR: 97 {beats}/min
SRS: 0
SSS: 0
TID: 0.93

## 2018-02-09 MED ORDER — FUROSEMIDE 10 MG/ML IJ SOLN
40.0000 mg | Freq: Two times a day (BID) | INTRAMUSCULAR | Status: DC
  Administered 2018-02-09 – 2018-02-13 (×8): 40 mg via INTRAVENOUS
  Filled 2018-02-09 (×8): qty 4

## 2018-02-09 MED ORDER — ALBUTEROL SULFATE (2.5 MG/3ML) 0.083% IN NEBU
2.5000 mg | INHALATION_SOLUTION | Freq: Two times a day (BID) | RESPIRATORY_TRACT | Status: DC
  Administered 2018-02-10 – 2018-02-13 (×5): 2.5 mg via RESPIRATORY_TRACT
  Filled 2018-02-09 (×5): qty 3

## 2018-02-09 MED ORDER — REGADENOSON 0.4 MG/5ML IV SOLN
0.4000 mg | Freq: Once | INTRAVENOUS | Status: AC
  Administered 2018-02-09: 0.4 mg via INTRAVENOUS

## 2018-02-09 MED ORDER — TECHNETIUM TC 99M TETROFOSMIN IV KIT
11.1730 | PACK | Freq: Once | INTRAVENOUS | Status: AC | PRN
  Administered 2018-02-09: 11.173 via INTRAVENOUS

## 2018-02-09 MED ORDER — SODIUM CHLORIDE 0.9% FLUSH
3.0000 mL | Freq: Two times a day (BID) | INTRAVENOUS | Status: DC
  Administered 2018-02-09 – 2018-02-13 (×8): 3 mL via INTRAVENOUS

## 2018-02-09 MED ORDER — TECHNETIUM TC 99M TETROFOSMIN IV KIT
30.4470 | PACK | Freq: Once | INTRAVENOUS | Status: AC | PRN
  Administered 2018-02-09: 30.447 via INTRAVENOUS

## 2018-02-09 NOTE — Progress Notes (Signed)
Progress Note  Patient Name: Scott [name] Date of Encounter: 02/09/2018  Primary Cardiologist: [facility]- Dr. Fletcher Anon  Subjective   Additional dose of IV lasix given last night 11/6 in preparation for this AM's stress test with patient reporting improved SOB after administration.  Today 11/7: Patient seen and examined during [place] stress test this AM. He denies any cardiac complaints of CP, palpitations, feeling of racing heart rate. He does continue to report SOB and orthopnea; however, he was able to lie flat for CT imaging prior to regadenson administration.   He reportedly had a fall earlier this AM when feeling SOB while attempting to get over the foot rest of the recliner. He denies hitting his head and LOC. He currently does not report any residual symptoms from the fall.   He reported that he still feels as if he is holding onto fluid and "would prefer it if he stayed, because he is kinda scared to go home now." He reported that he is scared, because of his SOB and feels that he will end up back in the ED if he leaves here right now. He reported that he is still not able to sleep at night d/t SOB and has to use his inhaler just when walking to the car.   I/O Net -2625 11/6-11/7 HR 82 bpm BP 121/77 Cr 1.28  1.19 and pending AM BMET today K 4.6 (monitor)  Inpatient Medications    Scheduled Meds: . albuterol  2.5 mg Nebulization Q4H  . aspirin EC  81 mg Oral Daily  . brinzolamide  1 drop Both Eyes Daily  . bromocriptine  7.5 mg Oral Daily  . budesonide (PULMICORT) nebulizer solution  0.25 mg Nebulization BID  . enoxaparin (LOVENOX) injection  40 mg Subcutaneous Q24H  . furosemide  40 mg Intravenous Daily  . latanoprost  1 drop Both Eyes QHS  . lisinopril  20 mg Oral Daily  . PARoxetine  40 mg Oral Daily   Continuous Infusions:  PRN Meds: acetaminophen **OR** acetaminophen, hydrALAZINE, labetalol, ondansetron **OR** ondansetron (ZOFRAN) IV   Vital Signs    Vitals:   02/09/18 0417 02/09/18 0455 02/09/18 0518 02/09/18 0647  BP:  (!) 142/79 122/79 121/77  Pulse:  86 79 82  Resp:  17  18  Temp:  98.7 F (37.1 C) 98.7 F (37.1 C) 98.9 F (37.2 C)  TempSrc:  Oral Oral Oral  SpO2: 92% 93% 98% 97%  Weight:  88.3 kg    Height:        Intake/Output Summary (Last 24 hours) at 02/09/2018 0931 Last data filed at 02/09/2018 0636 Gross per 24 hour  Intake 0 ml  Output 1925 ml  Net -1925 ml   Filed Weights   02/07/18 0415 02/08/18 0513 02/09/18 0455  Weight: 88.7 kg 89.2 kg 88.3 kg    Telemetry    Normal sinus rhythm with PACs, HR 70-100s - Personally Reviewed  ECG    No new tracing - Personally Reviewed  Physical Exam   GEN: No acute distress.  Waiting to complete his Lexiscan stress test. Neck:  JVP approximately 8 cm. Cardiac: RRR, 1/6 systolic murmur; no rubs, or gallops.  Respiratory:  Bibasilar trace crackles GI:  Firm and mildly distended. MS: improved / 1+ bilateral lower extremity edema  Neuro:  Nonfocal  Psych: Normal affect   Labs    Chemistry Recent Labs  Lab 02/04/18 2307 02/05/18 0557 02/07/18 0508 02/08/18 0505  NA 145 144 144 144  K 4.1 4.2 3.7 4.6  CL 106 102 97* 98  CO2 34* 35* 41* 37*  GLUCOSE 100* 191* 94 86  BUN 20 18 35* 31*  CREATININE 0.90 0.94 1.28* 1.19  CALCIUM 8.7* 8.8* 8.9 8.5*  PROT 7.2  --   --   --   ALBUMIN 3.6  --   --   --   AST 37  --   --   --   ALT 52*  --   --   --   ALKPHOS 75  --   --   --   BILITOT 0.6  --   --   --   GFRNONAA >60 >60 >60 >60  GFRAA >60 >60 >60 >60  ANIONGAP 5 7 6 9      Hematology Recent Labs  Lab 02/04/18 2307 02/05/18 0557  WBC 9.5 9.1  RBC 4.92 4.80  HGB 14.5 14.3  HCT 46.5 46.1  MCV 94.5 96.0  MCH 29.5 29.8  MCHC 31.2 31.0  RDW 13.4 13.6  PLT 329 286    Cardiac Enzymes Recent Labs  Lab 02/04/18 2307 02/05/18 0552 02/05/18 0847 02/05/18 1433  TROPONINI 0.06* 0.04* 0.03* 0.06*   No results for input(s): TROPIPOC in the last 168 hours.    BNP Recent Labs  Lab 02/04/18 2256  BNP 216.0*     DDimer No results for input(s): DDIMER in the last 168 hours.   Radiology    No results found.  Cardiac Studies   Pending NM Results   Echo (02/05/18): - Left ventricle: The cavity size was normal. Wall thickness was   normal. Systolic function was normal. The estimated ejection   fraction was in the range of 55% to 60%. Wall motion was normal;   there were no regional wall motion abnormalities. Doppler   parameters are consistent with abnormal left ventricular   relaxation (grade 1 diastolic dysfunction). - Left atrium: The atrium was normal in size. - Right ventricle: Systolic function was normal. - Pulmonary arteries: Systolic pressure could not be accurately   estimated.  Patient Profile     47 y.o. male with history of HFpEF, HLD, HTN, Klippel-Feil syndrome with restrictive lung disease and h/o airway hyperreactivity, alopecia, migraine, h/o open angle glaucoma, h/o congenital hydrocephalus, and clinical depression on Paxil that presented with acute on chronic diastolic HF likely 2/2 uncontrolled HTN  Assessment & Plan    Acute on chronic diastolic heart failure - Likely 2/2 uncontrolled HTN. Worsening orthopnea and PND. CXR showed cardiomegaly and mild pulmonary vascular congestion with BNP and troponin elevated. CV studies as above with EF 55-60% and G1DD; CXR showed mild pulmonary vascular congestion.  - Additional dosage of Lasix last night, per Dr. Rockey Situ, and with alleviation in SOB. Able to lie flat for today's NM stress test.  - Pending updated BMET this AM to assess renal function, electrolytes with continued diuresis - Continue diuresis with 40mg  IV lasix daily. I/O and daily weights / most recent labs as above in subjective. Continue O2 for SOB. Currently on 2L [place]. Continue to monitor I/O, daily weights.   Elevated troponin - No current CP. Initial admission EKG shows NSR, LVH and associated abnormal  repolarization. Mild troponin elevation in the setting of acute on chronic diastolic HFpEF likely d/t uncontrolled HTN but cannot rule out cardiac etiology pending stress test at this time.  - Continue hydralazine, labetolol, lisinopril. Started on ASA 81mg . LDL 81 and pending NM results, consider addition of statin. - As completed  NM stress today, resume heart healthy diet - Further recommendations pending results of myocardial perfusion stress test   HTN - BP controlled at 121/77 - Medical management with antihypertensives as above - Continue to monitor   Remainder per IM  For questions or updates, please contact [facility] Please consult [email or web address] for contact info under [facility].     Signed, Arvil Chaco, PA-C  02/09/2018, 9:31 AM

## 2018-02-09 NOTE — Care Management Note (Signed)
Case Management Note  Patient Details  Name: Scott [name] MRN: [id number] Date of Birth: [birth year]  Subjective/Objective:  Patient is from home with girl friend.  New CHF.  Does not have a functioning scale; RNCM provided.  Independent in all adls, denies issues accessing medical care, obtaining medications or with transportation.  Current with PCP.  No discharge needs identified at present by care manager or members of care team.  Currently on room air; weaning from 2L.  Continues on IV lasix 40mg  twice daily.                      Action/Plan:   Expected Discharge Date:                  Expected Discharge Plan:  Home/Self Care  In-House Referral:     Discharge planning Services  CM Consult  Post Acute Care Choice:    Choice offered to:     DME Arranged:    DME Agency:     HH Arranged:    HH Agency:     Status of Service:  Completed, signed off  If discussed at [employer] of Stay Meetings, dates discussed:    Additional Comments:  Elza Rafter, RN 02/09/2018, 4:04 PM

## 2018-02-09 NOTE — Progress Notes (Signed)
[facility] [place] NAME: Scott [name]    MR#:  [id number]  DATE OF BIRTH:  29-Mar-1971  SUBJECTIVE:   Patient had stress test this morning.  Still complaining of some exertional dyspnea but denies any other associated symptoms.  Will still need further IV diuresis as per cardiology.  REVIEW OF SYSTEMS:    Review of Systems  Constitutional: Negative for chills and fever.  HENT: Negative for congestion and tinnitus.   Eyes: Negative for blurred vision and double vision.  Respiratory: Positive for shortness of breath (exertional). Negative for cough and wheezing.   Cardiovascular: Negative for chest pain, orthopnea and PND.  Gastrointestinal: Negative for abdominal pain, diarrhea, nausea and vomiting.  Genitourinary: Negative for dysuria and hematuria.  Neurological: Negative for dizziness, sensory change and focal weakness.  All other systems reviewed and are negative.   Nutrition: Heart Healthy Tolerating Diet: Yes Tolerating PT: Eval noted  DRUG ALLERGIES:   Allergies  Allergen Reactions  . Codeine Shortness Of Breath    VITALS:  Blood pressure 121/77, pulse 82, temperature 98.9 F (37.2 C), temperature source Oral, resp. rate 18, height 5\' 2"  (1.575 m), weight 88.3 kg, SpO2 97 %.  PHYSICAL EXAMINATION:   Physical Exam  GENERAL:  47 y.o.-year-old patient lying in bed in no acute distress.  EYES: Pupils equal, round, reactive to light and accommodation. No scleral icterus. Extraocular muscles intact.  HEENT: Head atraumatic, normocephalic. Oropharynx and nasopharynx clear.  NECK:  Supple, no jugular venous distention. No thyroid enlargement, no tenderness.  LUNGS: Normal breath sounds bilaterally, no wheezing, no rales, No rhonchi. No use of accessory muscles of respiration.  CARDIOVASCULAR: S1, S2 normal. No murmurs, rubs, or gallops.  ABDOMEN: Soft, nontender, nondistended. Bowel sounds present. No organomegaly or mass.    EXTREMITIES: No cyanosis, clubbing or edema b/l.   Deformities on hands due to previous Klippel-Feil syndrome.  NEUROLOGIC: Cranial nerves II through XII are intact. No focal Motor or sensory deficits b/l.   PSYCHIATRIC: The patient is alert and oriented x 3.  SKIN: No obvious rash, lesion, or ulcer.    LABORATORY PANEL:   CBC Recent Labs  Lab 02/05/18 0557  WBC 9.1  HGB 14.3  HCT 46.1  PLT 286   ------------------------------------------------------------------------------------------------------------------  Chemistries  Recent Labs  Lab 02/04/18 2307  02/09/18 1119  NA 145   < > 141  K 4.1   < > 3.7  CL 106   < > 95*  CO2 34*   < > 35*  GLUCOSE 100*   < > 120*  BUN 20   < > 22*  CREATININE 0.90   < > 1.00  CALCIUM 8.7*   < > 9.1  AST 37  --   --   ALT 52*  --   --   ALKPHOS 75  --   --   BILITOT 0.6  --   --    < > = values in this interval not displayed.   ------------------------------------------------------------------------------------------------------------------  Cardiac Enzymes Recent Labs  Lab 02/05/18 1433  TROPONINI 0.06*   ------------------------------------------------------------------------------------------------------------------  RADIOLOGY:  Nm Myocar Multi W/spect W/wall Motion / Ef  Result Date: 02/09/2018 Pharmacological myocardial perfusion imaging study with no significant  Ischemia Small region of fixed apical defect, likely attenuation artifact. Normal wall motion, EF estimated at 35% (depressed secondary to GI uptake artifact) No EKG changes concerning for ischemia at peak stress or in recovery. Low risk scan, normal  EF on echocardiogram, >55% Signed, Esmond Plants, MD, Ph.D [facility]     ASSESSMENT AND PLAN:   47 year old male with past medical history of chronic diastolic CHF, hypertension, Klippel-Feil syndrome with restrictive lung disease and history of migraines, glaucoma, congenital hydrocephalus who presented to the  hospital due to shortness of breath.  1.  Acute on chronic diastolic CHF- this is the cause of patient's worsening shortness of breath. -Continue diuresis with IV Lasix, he is about 3.6 L negative since admission.  Vision is improving but continues to have significant exertional dyspnea.  Cardiology plans on continuing further IV diuresis for now.  Follow I's and O's and daily weights. - cont. Lisinopril.   2.  Elevated troponin- mild troponin elevation -Appreciate cardiology input and patient underwent a nuclear medicine stress test today which showed no wall motion abnormalities small apical fixed defect but normal ejection fraction. -Patient denies any acute chest pain.  3.  Essential hypertension-continue lisinopril.  4.  Glaucoma-continue Azopt, latanoprost eyedrops  Possible discharge tomorrow once improved from the respiratory standpoint  All the records are reviewed and case discussed with Care Management/Social Worker. Management plans discussed with the patient, family and they are in agreement.  CODE STATUS: Full code  DVT Prophylaxis: Lovenox  TOTAL TIME TAKING CARE OF THIS PATIENT: 25 minutes.   POSSIBLE D/C IN 1-2 DAYS, DEPENDING ON CLINICAL CONDITION.   Henreitta Leber M.D on 02/09/2018 at 3:14 PM  Between 7am to 6pm - Pager - [phone]  After 6pm go to [email or web address] - [employer] [facility]  Office  [phone]  CC: Primary care physician; [name], [name], PA

## 2018-02-09 NOTE — Progress Notes (Signed)
SATURATION QUALIFICATIONS: (This note is used to comply with regulatory documentation for home oxygen)  Patient Saturations on Room Air at Rest = 93%  Patient Saturations on Room Air while Ambulating = 85%  Patient Saturations on 1 Liters of oxygen while Ambulating = 96%  Please briefly explain why patient needs home oxygen:

## 2018-02-09 NOTE — Plan of Care (Signed)

## 2018-02-09 NOTE — Care Management Important Message (Signed)
Copy of signed IM left with patient in room.  

## 2018-02-09 NOTE — Progress Notes (Addendum)
Was the fall witnessed: witnessed by girl friend.  Patient condition before and after the fall: independent  Patient's reaction to the fall: embarrassed  Name of the doctor that was notified including date and time  [place] . 02/09/2018 0534 Any interventions and vital signs: Q2 assessment and VS .   At the beginning of the shift patient was A&O X4, denied being in any pain, amd ambulated in the room , and to the bathroom independently, with girl friend at the bedside. As of 0500 patient was on the bed with his girlfriend. Patient stated that he later transferred to the recliner and slide off from the recliner while he was repositioning himself. At [place] the girl friend called to out to the nursing station to help get patient off the floor. Post fall, VS was WDL, patient denied being in pain or hurting any where. Patient was assisted back to the bed. Dr. Arta Silence, the unit director,the charge nurse and the nursing supervisor were all notified. Care nurse continues to monitor patient.  Yellow arm band and yellow socks and bed alarm were applied. Patient and SO were further educated on fall prevention.

## 2018-02-09 NOTE — Progress Notes (Signed)
Patient has no acute event overnight. NSR with stable VS. NPO for scheduled stress test in AM . SO at bedside overnight.

## 2018-02-10 LAB — BASIC METABOLIC PANEL
ANION GAP: 13 (ref 5–15)
BUN: 25 mg/dL — ABNORMAL HIGH (ref 6–20)
CALCIUM: 9.1 mg/dL (ref 8.9–10.3)
CO2: 33 mmol/L — ABNORMAL HIGH (ref 22–32)
Chloride: 97 mmol/L — ABNORMAL LOW (ref 98–111)
Creatinine, Ser: 1.09 mg/dL (ref 0.61–1.24)
GLUCOSE: 108 mg/dL — AB (ref 70–99)
Potassium: 4.7 mmol/L (ref 3.5–5.1)
Sodium: 143 mmol/L (ref 135–145)

## 2018-02-10 MED ORDER — POLYVINYL ALCOHOL 1.4 % OP SOLN
1.0000 [drp] | Freq: Four times a day (QID) | OPHTHALMIC | Status: DC | PRN
  Administered 2018-02-11 (×2): 1 [drp] via OPHTHALMIC
  Filled 2018-02-10: qty 15

## 2018-02-10 MED ORDER — HYPROMELLOSE (GONIOSCOPIC) 2.5 % OP SOLN: 1.0000 [drp] | Freq: Four times a day (QID) | OPHTHALMIC | Status: DC | PRN

## 2018-02-10 NOTE — Progress Notes (Addendum)
Pt complaints of burning on both eyes. Upon assessment the right eye is more red than left. Notify [facility]. Will continue to monitor.  Update 2251: Talked to doctor Prasanna and ordered liquifilm tears 1.4% ophthalmic solution 1 drops on both eyes 4 times daily PRN. Will continue to monitor.  Update 0442: Pt complaining of inidgestion. Notify prime. Will continue to monitor.  Update 0459L Dr. Duane Boston ordered protonix EC 40 mg tablet daily. Will continue to monitor.  Update 0550: Pt refused the bed alarm but was educated about safety. Will continue to monitor.

## 2018-02-10 NOTE — Progress Notes (Signed)
[facility] [place] NAME: Scott [name]    MR#:  [id number]  DATE OF BIRTH:  04-03-1971  SUBJECTIVE:   Still having some exertional shortness of breath and becomes hypoxic.  No chest pain and no other acute events overnight.  Denies any further nausea or vomiting.  REVIEW OF SYSTEMS:    Review of Systems  Constitutional: Negative for chills and fever.  HENT: Negative for congestion and tinnitus.   Eyes: Negative for blurred vision and double vision.  Respiratory: Positive for shortness of breath (exertional). Negative for cough and wheezing.   Cardiovascular: Negative for chest pain, orthopnea and PND.  Gastrointestinal: Negative for abdominal pain, diarrhea, nausea and vomiting.  Genitourinary: Negative for dysuria and hematuria.  Neurological: Negative for dizziness, sensory change and focal weakness.  All other systems reviewed and are negative.   Nutrition: Heart Healthy Tolerating Diet: Yes Tolerating PT: Eval noted  DRUG ALLERGIES:   Allergies  Allergen Reactions  . Codeine Shortness Of Breath    VITALS:  Blood pressure 133/68, pulse 79, temperature 98.4 F (36.9 C), temperature source Oral, resp. rate 18, height 5\' 2"  (1.575 m), weight 86.4 kg, SpO2 92 %.  PHYSICAL EXAMINATION:   Physical Exam  GENERAL:  47 y.o.-year-old patient lying in bed in no acute distress.  EYES: Pupils equal, round, reactive to light and accommodation. No scleral icterus. Extraocular muscles intact.  HEENT: Head atraumatic, normocephalic. Oropharynx and nasopharynx clear.  NECK:  Supple, no jugular venous distention. No thyroid enlargement, no tenderness.  LUNGS: Normal breath sounds bilaterally, no wheezing, no rales, No rhonchi. No use of accessory muscles of respiration.  CARDIOVASCULAR: S1, S2 normal. No murmurs, rubs, or gallops.  ABDOMEN: Soft, nontender, nondistended. Bowel sounds present. No organomegaly or mass.  EXTREMITIES: No  cyanosis, clubbing or edema b/l.   Deformities on hands due to previous Klippel-Feil syndrome.  NEUROLOGIC: Cranial nerves II through XII are intact. No focal Motor or sensory deficits b/l.   PSYCHIATRIC: The patient is alert and oriented x 3.  SKIN: No obvious rash, lesion, or ulcer.    LABORATORY PANEL:   CBC Recent Labs  Lab 02/05/18 0557  WBC 9.1  HGB 14.3  HCT 46.1  PLT 286   ------------------------------------------------------------------------------------------------------------------  Chemistries  Recent Labs  Lab 02/04/18 2307  02/10/18 0414  NA 145   < > 143  K 4.1   < > 4.7  CL 106   < > 97*  CO2 34*   < > 33*  GLUCOSE 100*   < > 108*  BUN 20   < > 25*  CREATININE 0.90   < > 1.09  CALCIUM 8.7*   < > 9.1  AST 37  --   --   ALT 52*  --   --   ALKPHOS 75  --   --   BILITOT 0.6  --   --    < > = values in this interval not displayed.   ------------------------------------------------------------------------------------------------------------------  Cardiac Enzymes Recent Labs  Lab 02/05/18 1433  TROPONINI 0.06*   ------------------------------------------------------------------------------------------------------------------  RADIOLOGY:  Nm Myocar Multi W/spect W/wall Motion / Ef  Result Date: 02/09/2018 Pharmacological myocardial perfusion imaging study with no significant  Ischemia Small region of fixed apical defect, likely attenuation artifact. Normal wall motion, EF estimated at 35% (depressed secondary to GI uptake artifact) No EKG changes concerning for ischemia at peak stress or in recovery. Low risk scan, normal EF on echocardiogram, >  55% Signed, Esmond Plants, MD, Ph.D [facility]     ASSESSMENT AND PLAN:   47 year old male with past medical history of chronic diastolic CHF, hypertension, Klippel-Feil syndrome with restrictive lung disease and history of migraines, glaucoma, congenital hydrocephalus who presented to the hospital due to  shortness of breath.  1.  Acute on chronic diastolic CHF- this is the cause of patient's worsening shortness of breath. -Continue diuresis with IV Lasix, he is about 4.7 L negative since admission.  Pt is improving but continues to have significant exertional dyspnea.  -Continue diuresis with IV Lasix as per cardiology.  Follow I's and O's and daily weights. - cont. Lisinopril.   2.  Acute respiratory failure with hypoxia-thought to be secondary to CHF.  Continue treatment as mentioned above.  Patient may also have underlying restrictive lung disease given his body habitus and stature and may need pulmonary function tests and even a right heart catheterization which can be done as an outpatient. - Patient will likely need home oxygen prior to discharge.  3.  Elevated troponin- mild troponin elevation -Appreciate cardiology input and patient underwent a nuclear medicine stress test on 02/09/18 which showed no wall motion abnormalities small apical fixed defect but normal ejection fraction. -Patient denies any acute chest pain.  4.  Essential hypertension-continue lisinopril.  5.  Glaucoma-continue Azopt, latanoprost eyedrops   All the records are reviewed and case discussed with Care Management/Social Worker. Management plans discussed with the patient, family and they are in agreement.  CODE STATUS: Full code  DVT Prophylaxis: Lovenox  TOTAL TIME TAKING CARE OF THIS PATIENT: 30 minutes.   POSSIBLE D/C IN 1-2 DAYS, DEPENDING ON CLINICAL CONDITION.   Henreitta Leber M.D on 02/10/2018 at 3:42 PM  Between 7am to 6pm - Pager - [phone]  After 6pm go to [email or web address] - [employer] [facility]  Office  [phone]  CC: Primary care physician; [name], [name], PA

## 2018-02-10 NOTE — Progress Notes (Signed)
Progress Note  Patient Name: Scott [name] Date of Encounter: 02/10/2018  Primary Cardiologist: [facility]- Dr. Fletcher Anon  Subjective   Still short of breath this morning 5 L negative Renal function stable on Lasix 40 IV twice daily Yesterday reported hypoxia saturations 86% walking  Inpatient Medications    Scheduled Meds: . albuterol  2.5 mg Nebulization BID  . aspirin EC  81 mg Oral Daily  . brinzolamide  1 drop Both Eyes Daily  . bromocriptine  7.5 mg Oral Daily  . budesonide (PULMICORT) nebulizer solution  0.25 mg Nebulization BID  . enoxaparin (LOVENOX) injection  40 mg Subcutaneous Q24H  . furosemide  40 mg Intravenous BID  . latanoprost  1 drop Both Eyes QHS  . lisinopril  20 mg Oral Daily  . PARoxetine  40 mg Oral Daily  . sodium chloride flush  3 mL Intravenous Q12H   Continuous Infusions:  PRN Meds: acetaminophen **OR** acetaminophen, hydrALAZINE, labetalol, ondansetron **OR** ondansetron (ZOFRAN) IV   Vital Signs    Vitals:   02/09/18 2004 02/09/18 2026 02/10/18 0412 02/10/18 0808  BP:  127/67 122/73 133/68  Pulse:  79 78 79  Resp:  18 18 18   Temp:  98.4 F (36.9 C) 98.6 F (37 C) 98.4 F (36.9 C)  TempSrc:  Oral Oral Oral  SpO2: 98% 97% 96% 92%  Weight:   86.4 kg   Height:        Intake/Output Summary (Last 24 hours) at 02/10/2018 1622 Last data filed at 02/10/2018 1422 Gross per 24 hour  Intake 600 ml  Output 1680 ml  Net -1080 ml   Filed Weights   02/08/18 0513 02/09/18 0455 02/10/18 0412  Weight: 89.2 kg 88.3 kg 86.4 kg    Telemetry    Normal sinus rhythm - Personally Reviewed  ECG    No new tracing - Personally Reviewed  Physical Exam   Constitutional:  oriented to person, place, and time. No distress.  HENT:  Head: Grossly normal Eyes:  no discharge. No scleral icterus.  Neck: No JVD, no carotid bruits  Cardiovascular: Regular rate and rhythm, no murmurs appreciated Pulmonary/Chest: Clear to auscultation bilaterally, no wheezes  or rails Abdominal: Soft.  no distension.  no tenderness.  Musculoskeletal: Normal range of motion Neurological:  normal muscle tone. Coordination normal. No atrophy Skin: Skin warm and dry Psychiatric: normal affect, pleasant   Labs    Chemistry Recent Labs  Lab 02/04/18 2307  02/08/18 0505 02/09/18 1119 02/10/18 0414  NA 145   < > 144 141 143  K 4.1   < > 4.6 3.7 4.7  CL 106   < > 98 95* 97*  CO2 34*   < > 37* 35* 33*  GLUCOSE 100*   < > 86 120* 108*  BUN 20   < > 31* 22* 25*  CREATININE 0.90   < > 1.19 1.00 1.09  CALCIUM 8.7*   < > 8.5* 9.1 9.1  PROT 7.2  --   --   --   --   ALBUMIN 3.6  --   --   --   --   AST 37  --   --   --   --   ALT 52*  --   --   --   --   ALKPHOS 75  --   --   --   --   BILITOT 0.6  --   --   --   --  GFRNONAA >60   < > >60 >60 >60  GFRAA >60   < > >60 >60 >60  ANIONGAP 5   < > 9 11 13    < > = values in this interval not displayed.     Hematology Recent Labs  Lab 02/04/18 2307 02/05/18 0557  WBC 9.5 9.1  RBC 4.92 4.80  HGB 14.5 14.3  HCT 46.5 46.1  MCV 94.5 96.0  MCH 29.5 29.8  MCHC 31.2 31.0  RDW 13.4 13.6  PLT 329 286    Cardiac Enzymes Recent Labs  Lab 02/04/18 2307 02/05/18 0552 02/05/18 0847 02/05/18 1433  TROPONINI 0.06* 0.04* 0.03* 0.06*   No results for input(s): TROPIPOC in the last 168 hours.   BNP Recent Labs  Lab 02/04/18 2256  BNP 216.0*     DDimer No results for input(s): DDIMER in the last 168 hours.   Radiology    Nm Myocar Multi W/spect W/wall Motion / Ef  Result Date: 02/09/2018 Pharmacological myocardial perfusion imaging study with no significant  Ischemia Small region of fixed apical defect, likely attenuation artifact. Normal wall motion, EF estimated at 35% (depressed secondary to GI uptake artifact) No EKG changes concerning for ischemia at peak stress or in recovery. Low risk scan, normal EF on echocardiogram, >55% Signed, Esmond Plants, MD, Ph.D [facility]    Cardiac Studies   Echo  (02/05/18): - Left ventricle: The cavity size was normal. Wall thickness was   normal. Systolic function was normal. The estimated ejection   fraction was in the range of 55% to 60%. Wall motion was normal;   there were no regional wall motion abnormalities. Doppler   parameters are consistent with abnormal left ventricular   relaxation (grade 1 diastolic dysfunction). - Left atrium: The atrium was normal in size. - Right ventricle: Systolic function was normal. - Pulmonary arteries: Systolic pressure could not be accurately   estimated.  Patient Profile     47 y.o. male with history of HFpEF, HLD, HTN, Klippel-Feil syndrome with restrictive lung disease and h/o airway hyperreactivity, alopecia, migraine, h/o open angle glaucoma, h/o congenital hydrocephalus, and clinical depression on Paxil that presented with acute on chronic diastolic HF likely 2/2 uncontrolled HTN  Assessment & Plan    Acute on chronic diastolic heart failure Hypoxic again today saturations into the 80s Would keep on Lasix IV twice daily  CXR showed cardiomegaly and mild pulmonary vascular congestion with BNP and troponin elevated.  EF 55-60% and G1DD; CXR showed mild pulmonary vascular congestion.  For climbing BUN and creatinine would need to back down on diuretic -We have had discussions on monitoring weight daily and adjustment of diuretics, minimizing his salt and fluid intake  Respiratory distress Suspect underlying pulmonary disease, restrictive possibly based off body habitus and disease processes  Elevated troponin Likely demand ischemia in the setting of CHF, hypertension stress test this admission with no ischemia  HTN Blood pressure stable 295 systolic   Greater than 62% was spent in counseling and coordination of care with patient Total encounter time 25 minutes or more   Signed: Esmond Plants  M.D., Ph.D. [facility]

## 2018-02-11 LAB — BASIC METABOLIC PANEL
ANION GAP: 12 (ref 5–15)
BUN: 31 mg/dL — ABNORMAL HIGH (ref 6–20)
CALCIUM: 9 mg/dL (ref 8.9–10.3)
CO2: 33 mmol/L — AB (ref 22–32)
Chloride: 97 mmol/L — ABNORMAL LOW (ref 98–111)
Creatinine, Ser: 1.17 mg/dL (ref 0.61–1.24)
GLUCOSE: 94 mg/dL (ref 70–99)
Potassium: 4.9 mmol/L (ref 3.5–5.1)
Sodium: 142 mmol/L (ref 135–145)

## 2018-02-11 MED ORDER — PANTOPRAZOLE SODIUM 40 MG PO TBEC: 40.0000 mg | DELAYED_RELEASE_TABLET | Freq: Every day | ORAL | Status: DC

## 2018-02-11 MED ORDER — MORPHINE SULFATE (PF) 2 MG/ML IV SOLN: 2.0000 mg | INTRAVENOUS | Status: DC | PRN

## 2018-02-11 MED ORDER — PANTOPRAZOLE SODIUM 40 MG PO TBEC
40.0000 mg | DELAYED_RELEASE_TABLET | Freq: Every day | ORAL | Status: DC
  Administered 2018-02-11 – 2018-02-12 (×2): 40 mg via ORAL
  Filled 2018-02-11 (×3): qty 1

## 2018-02-11 NOTE — Progress Notes (Signed)
Progress Note   Subjective   Doing well today, the patient denies CP.  SOB is slowly improving.  No new concerns  Inpatient Medications    Scheduled Meds: . albuterol  2.5 mg Nebulization BID  . aspirin EC  81 mg Oral Daily  . brinzolamide  1 drop Both Eyes Daily  . bromocriptine  7.5 mg Oral Daily  . budesonide (PULMICORT) nebulizer solution  0.25 mg Nebulization BID  . enoxaparin (LOVENOX) injection  40 mg Subcutaneous Q24H  . furosemide  40 mg Intravenous BID  . latanoprost  1 drop Both Eyes QHS  . lisinopril  20 mg Oral Daily  . pantoprazole  40 mg Oral Daily  . PARoxetine  40 mg Oral Daily  . sodium chloride flush  3 mL Intravenous Q12H   Continuous Infusions:  PRN Meds: acetaminophen **OR** acetaminophen, hydrALAZINE, labetalol, ondansetron **OR** ondansetron (ZOFRAN) IV, polyvinyl alcohol   Vital Signs    Vitals:   02/10/18 1943 02/10/18 2031 02/11/18 0409 02/11/18 0920  BP:  118/79 114/79 129/72  Pulse:  86 79 84  Resp:   17 14  Temp:  98.5 F (36.9 C) 99 F (37.2 C)   TempSrc:  Oral Oral   SpO2: 90% 97% 96% 98%  Weight:   85.8 kg   Height:        Intake/Output Summary (Last 24 hours) at 02/11/2018 1220 Last data filed at 02/11/2018 1026 Gross per 24 hour  Intake 600 ml  Output 1310 ml  Net -710 ml   Filed Weights   02/09/18 0455 02/10/18 0412 02/11/18 0409  Weight: 88.3 kg 86.4 kg 85.8 kg    Telemetry    sinus - Personally Reviewed  Physical Exam   GEN- alert and oriented x 3 today.   Head- normocephalic, atraumatic Eyes-  Sclera clear, conjunctiva pink Ears- hearing intact Oropharynx- clear Neck- supple, Lungs- few basilar rales, normal work of breathing Heart- Regular rate and rhythm  GI- soft, NT, ND, + BS Extremities- no clubbing, cyanosis, or edema  MS- multiple deformities noted Skin- no rash or lesion Psych- euthymic mood, full affect Neuro- strength and sensation are intact   Labs    Chemistry Recent Labs  Lab  02/04/18 2307  02/09/18 1119 02/10/18 0414 02/11/18 0548  NA 145   < > 141 143 142  K 4.1   < > 3.7 4.7 4.9  CL 106   < > 95* 97* 97*  CO2 34*   < > 35* 33* 33*  GLUCOSE 100*   < > 120* 108* 94  BUN 20   < > 22* 25* 31*  CREATININE 0.90   < > 1.00 1.09 1.17  CALCIUM 8.7*   < > 9.1 9.1 9.0  PROT 7.2  --   --   --   --   ALBUMIN 3.6  --   --   --   --   AST 37  --   --   --   --   ALT 52*  --   --   --   --   ALKPHOS 75  --   --   --   --   BILITOT 0.6  --   --   --   --   GFRNONAA >60   < > >60 >60 >60  GFRAA >60   < > >60 >60 >60  ANIONGAP 5   < > 11 13 12    < > = values in this interval  not displayed.     Hematology Recent Labs  Lab 02/04/18 2307 02/05/18 0557  WBC 9.5 9.1  RBC 4.92 4.80  HGB 14.5 14.3  HCT 46.5 46.1  MCV 94.5 96.0  MCH 29.5 29.8  MCHC 31.2 31.0  RDW 13.4 13.6  PLT 329 286    Cardiac Enzymes Recent Labs  Lab 02/04/18 2307 02/05/18 0552 02/05/18 0847 02/05/18 1433  TROPONINI 0.06* 0.04* 0.03* 0.06*   No results for input(s): TROPIPOC in the last 168 hours.     Patient Profile     47 y.o. male with history of HFpEF, HLD, HTN, Klippel-Feil syndrome with restrictive lung disease and h/o airway hyperreactivity, alopecia, migraine, h/o open angle glaucoma, h/o congenital hydrocephalus, and clinical depression on Paxil that presented with acute on chronic diastolic HF likely 2/2 uncontrolled HTN  Assessment & Plan    1.  Acute on chronic diastolic dysfunction Improving with IV diuresis Continue diuresis over the weekend 2 gram sodium diet  2. HTN Much improved  3. Elevated troponin Demand ischemia No further workup planned  Anticipate ongoing diuresis over the weekend Cardiology to see again on Monday  Thompson Grayer MD, [facility] 02/11/2018 12:20 PM

## 2018-02-11 NOTE — Progress Notes (Signed)
Patient ambulated to check for walking oxygen pulse saturation level, patient desaturated to about 85%, but only had to pause momentarily before coming back up to 91% earlier during ambulation. Made it the rest of the way around the nurses's station and back to his room while maintaining a pulse ox of at least 91% on room air. Will inform physician. Will continue to monitor. Wenda Low [facility]

## 2018-02-11 NOTE — Progress Notes (Addendum)
Pt refused bed alarm at this but was educated safety. Will continue to monitor.   Update 2327: Pt complaining of discomfort on his chest 4 out 10. Vital signs were stable except BP was  high at 141/82. Notify the prime. Will continue monitor.  Update 2334: Dr. Aliene Altes orderedTroponin once, EKG 12- LEAD and morphine 2-4 mg IV injection every 3 hours for severe pain. Will continue to monitor.  Update 1148: Pt did states wants to take tylenol at this time. Tylenol was administered. Will continue to monitor.  Update 0027: Pt request to have an order for Valium for anxiety. Continous pulsox. Notify Prime. Will continue to monitor.   Update 0031. Dr. Aliene Altes ordered Valium 2 mg oral daily (PRN). Will continue to monitor.  Update 0424: Pt troponin resulted and is at 0.04. Will continue to monitor.

## 2018-02-11 NOTE — Progress Notes (Signed)
Patient ID: Scott [name], male   DOB: [birth year], 47 y.o.   MRN: [id number]  [employer] PROGRESS NOTE  Scott [name] AST:[id number] DOB: 08/22/1970 DOA: 02/04/2018 PCP: Margo Common, PA  HPI/Subjective: Patient was not feeling that great while he was washing up and shaving.  Nervous about going home.  Still little short of breath.  Objective: Vitals:   02/11/18 0409 02/11/18 0920  BP: 114/79 129/72  Pulse: 79 84  Resp: 17 14  Temp: 99 F (37.2 C)   SpO2: 96% 98%    Filed Weights   02/09/18 0455 02/10/18 0412 02/11/18 0409  Weight: 88.3 kg 86.4 kg 85.8 kg    ROS: Review of Systems  Constitutional: Negative for chills and fever.  Eyes: Negative for blurred vision.  Respiratory: Positive for shortness of breath. Negative for cough.   Cardiovascular: Negative for chest pain.  Gastrointestinal: Negative for abdominal pain, constipation, diarrhea, nausea and vomiting.  Genitourinary: Negative for dysuria.  Musculoskeletal: Negative for joint pain.  Neurological: Negative for dizziness and headaches.   Exam: Physical Exam  HENT:  Nose: No mucosal edema.  Mouth/Throat: No oropharyngeal exudate or posterior oropharyngeal edema.  Eyes: Pupils are equal, round, and reactive to light. Conjunctivae, EOM and lids are normal.  Neck: No JVD present. Carotid bruit is not present. No edema present. No thyroid mass and no thyromegaly present.  Cardiovascular: S1 normal and S2 normal. Exam reveals no gallop.  No murmur heard. Pulses:      Dorsalis pedis pulses are 2+ on the right side, and 2+ on the left side.  Respiratory: No respiratory distress. He has decreased breath sounds in the right lower field and the left lower field. He has no wheezes. He has no rhonchi. He has no rales.  GI: Soft. Bowel sounds are normal. There is no tenderness.  Musculoskeletal:       Right ankle: He exhibits no swelling.       Left ankle: He exhibits no swelling.  Left hand deformity   Lymphadenopathy:    He has no cervical adenopathy.  Neurological: He is alert. No cranial nerve deficit.  Skin: Skin is warm. No rash noted. Nails show no clubbing.  Psychiatric: He has a normal mood and affect.      Data Reviewed: Basic Metabolic Panel: Recent Labs  Lab 02/07/18 0508 02/08/18 0505 02/09/18 1119 02/10/18 0414 02/11/18 0548  NA 144 144 141 143 142  K 3.7 4.6 3.7 4.7 4.9  CL 97* 98 95* 97* 97*  CO2 41* 37* 35* 33* 33*  GLUCOSE 94 86 120* 108* 94  BUN 35* 31* 22* 25* 31*  CREATININE 1.28* 1.19 1.00 1.09 1.17  CALCIUM 8.9 8.5* 9.1 9.1 9.0   Liver Function Tests: Recent Labs  Lab 02/04/18 2307  AST 37  ALT 52*  ALKPHOS 75  BILITOT 0.6  PROT 7.2  ALBUMIN 3.6   CBC: Recent Labs  Lab 02/04/18 2307 02/05/18 0557  WBC 9.5 9.1  NEUTROABS 6.0  --   HGB 14.5 14.3  HCT 46.5 46.1  MCV 94.5 96.0  PLT 329 286   Cardiac Enzymes: Recent Labs  Lab 02/04/18 2307 02/05/18 0552 02/05/18 0847 02/05/18 1433  TROPONINI 0.06* 0.04* 0.03* 0.06*   BNP (last 3 results) Recent Labs    02/04/18 2256  BNP 216.0*      Recent Results (from the past 240 hour(s))  Respiratory Panel by PCR     Status: None   Collection Time: 02/04/18 11:06  PM  Result Value Ref Range Status   Adenovirus NOT DETECTED NOT DETECTED Final   Coronavirus 229E NOT DETECTED NOT DETECTED Final   Coronavirus HKU1 NOT DETECTED NOT DETECTED Final   Coronavirus NL63 NOT DETECTED NOT DETECTED Final   Coronavirus OC43 NOT DETECTED NOT DETECTED Final   Metapneumovirus NOT DETECTED NOT DETECTED Final   Rhinovirus / Enterovirus NOT DETECTED NOT DETECTED Final   Influenza A NOT DETECTED NOT DETECTED Final   Influenza B NOT DETECTED NOT DETECTED Final   Parainfluenza Virus 1 NOT DETECTED NOT DETECTED Final   Parainfluenza Virus 2 NOT DETECTED NOT DETECTED Final   Parainfluenza Virus 3 NOT DETECTED NOT DETECTED Final   Parainfluenza Virus 4 NOT DETECTED NOT DETECTED Final   Respiratory  Syncytial Virus NOT DETECTED NOT DETECTED Final   Bordetella pertussis NOT DETECTED NOT DETECTED Final   Chlamydophila pneumoniae NOT DETECTED NOT DETECTED Final   Mycoplasma pneumoniae NOT DETECTED NOT DETECTED Final    Comment: Performed at [facility], [place] [address], [place], [place] [address]      Scheduled Meds: . albuterol  2.5 mg Nebulization BID  . aspirin EC  81 mg Oral Daily  . brinzolamide  1 drop Both Eyes Daily  . bromocriptine  7.5 mg Oral Daily  . budesonide (PULMICORT) nebulizer solution  0.25 mg Nebulization BID  . enoxaparin (LOVENOX) injection  40 mg Subcutaneous Q24H  . furosemide  40 mg Intravenous BID  . latanoprost  1 drop Both Eyes QHS  . lisinopril  20 mg Oral Daily  . pantoprazole  40 mg Oral Daily  . PARoxetine  40 mg Oral Daily  . sodium chloride flush  3 mL Intravenous Q12H   Continuous Infusions:  Assessment/Plan:  1. Acute on chronic diastolic congestive heart failure continue IV Lasix 40 mg IV twice daily. 2. Acute respiratory failure with hypoxia.  This has resolved and patient is breathing comfortably on room air.  Walked and ambulated but only desaturated for brief few seconds.  Does not require oxygen upon going home currently.  We will get an overnight oximetry test.  Recommend an overnight sleep study as outpatient. 3. Elevated troponin demand ischemia from congestive heart failure 4. Hypertension on lisinopril 5. Glaucoma on eyedrops 6. History of Klippel-File Syndrome  Code Status:     Code Status Orders  (From admission, onward)         Start     Ordered   02/05/18 0241  Full code  Continuous     02/05/18 0240        Code Status History    This patient has a current code status but no historical code status.     Family Communication: Girlfriend at the bedside Disposition Plan: Evaluate daily for disposition  Consultants:  Cardiology  Time spent: 28 minutes  [place]

## 2018-02-11 NOTE — Plan of Care (Signed)
  Problem: Education: Goal: Knowledge of General Education information will improve Description Including pain rating scale, medication(s)/side effects and non-pharmacologic comfort measures Outcome: Progressing   Problem: Health Behavior/Discharge Planning: Goal: Ability to manage health-related needs will improve Outcome: Progressing   Problem: Safety: Goal: Ability to remain free from injury will improve Outcome: Progressing   Problem: Education: Goal: Ability to demonstrate management of disease process will improve Outcome: Progressing Goal: Ability to verbalize understanding of medication therapies will improve Outcome: Progressing   Problem: Activity: Goal: Capacity to carry out activities will improve Outcome: Progressing

## 2018-02-11 NOTE — Plan of Care (Signed)
  Problem: Health Behavior/Discharge Planning: Goal: Ability to manage health-related needs will improve Outcome: Progressing Note:  Patient willing to stay at least one more day to receive additional IV diuresis. Will continue to monitor for further development of care / discharge planning. Scott [name] [facility]

## 2018-02-12 LAB — TROPONIN I: TROPONIN I: 0.04 ng/mL — AB (ref <0.03)

## 2018-02-12 LAB — BASIC METABOLIC PANEL
Anion gap: 9 (ref 5–15)
BUN: 34 mg/dL — AB (ref 6–20)
CHLORIDE: 93 mmol/L — AB (ref 98–111)
CO2: 35 mmol/L — ABNORMAL HIGH (ref 22–32)
Calcium: 9 mg/dL (ref 8.9–10.3)
Creatinine, Ser: 1.22 mg/dL (ref 0.61–1.24)
GFR calc Af Amer: >60 mL/min (ref >60)
GFR calc non Af Amer: >60 mL/min (ref >60)
GLUCOSE: 107 mg/dL — AB (ref 70–99)
Potassium: 3.8 mmol/L (ref 3.5–5.1)
SODIUM: 137 mmol/L (ref 135–145)

## 2018-02-12 MED ORDER — DIAZEPAM 2 MG PO TABS
2.0000 mg | ORAL_TABLET | Freq: Every day | ORAL | Status: DC | PRN
  Administered 2018-02-12: 2 mg via ORAL
  Filled 2018-02-12: qty 1

## 2018-02-12 MED ORDER — CIPROFLOXACIN HCL 0.3 % OP SOLN
2.0000 [drp] | OPHTHALMIC | Status: DC
  Administered 2018-02-12 – 2018-02-13 (×6): 2 [drp] via OPHTHALMIC
  Filled 2018-02-12: qty 2.5

## 2018-02-12 MED ORDER — NYSTATIN 100000 UNIT/ML MT SUSP
5.0000 mL | Freq: Four times a day (QID) | OROMUCOSAL | Status: DC
  Administered 2018-02-12 – 2018-02-13 (×5): 500000 [IU] via ORAL
  Filled 2018-02-12 (×5): qty 5

## 2018-02-12 NOTE — Plan of Care (Signed)
  Problem: Health Behavior/Discharge Planning: Goal: Ability to manage health-related needs will improve Outcome: Progressing   Problem: Activity: Goal: Risk for activity intolerance will decrease Outcome: Progressing   Problem: Coping: Goal: Level of anxiety will decrease Outcome: Progressing   Problem: Pain Managment: Goal: General experience of comfort will improve Outcome: Progressing   Problem: Safety: Goal: Ability to remain free from injury will improve Outcome: Progressing   Problem: Education: Goal: Ability to demonstrate management of disease process will improve Outcome: Progressing   Problem: Activity: Goal: Capacity to carry out activities will improve Outcome: Progressing

## 2018-02-12 NOTE — Progress Notes (Signed)
Patient ID: Scott [name], male   DOB: 08-09-1970, 47 y.o.   MRN: [id number]  [employer] PROGRESS NOTE  Scott [name]:[id number] DOB: 02-May-1970 DOA: 02/04/2018 PCP: Scott Common, PA  HPI/Subjective: Patient still not feeling that great.  He walked around with the nursing staff today and he maintain his saturations.  Some complaints of some eye discharge today  Objective: Vitals:   02/12/18 0812 02/12/18 1108  BP: 135/74   Pulse: 70   Resp: 16   Temp: 98.1 F (36.7 C)   SpO2: 92% 92%    Filed Weights   02/10/18 0412 02/11/18 0409 02/12/18 0553  Weight: 86.4 kg 85.8 kg 85.7 kg    ROS: Review of Systems  Constitutional: Negative for chills and fever.  Eyes: Positive for discharge. Negative for blurred vision.  Respiratory: Positive for shortness of breath. Negative for cough.   Cardiovascular: Negative for chest pain.  Gastrointestinal: Negative for abdominal pain, constipation, diarrhea, nausea and vomiting.  Genitourinary: Negative for dysuria.  Musculoskeletal: Negative for joint pain.  Neurological: Negative for dizziness and headaches.   Exam: Physical Exam  HENT:  Nose: No mucosal edema.  Mouth/Throat: No oropharyngeal exudate or posterior oropharyngeal edema.  Eyes: Pupils are equal, round, and reactive to light. EOM and lids are normal. Left conjunctiva is injected.  Crusting outside the left eye  Neck: No JVD present. Carotid bruit is not present. No edema present. No thyroid mass and no thyromegaly present.  Cardiovascular: S1 normal and S2 normal. Exam reveals no gallop.  No murmur heard. Pulses:      Dorsalis pedis pulses are 2+ on the right side, and 2+ on the left side.  Respiratory: No respiratory distress. He has decreased breath sounds in the right lower field and the left lower field. He has no wheezes. He has no rhonchi. He has no rales.  GI: Soft. Bowel sounds are normal. There is no tenderness.  Musculoskeletal:       Right ankle: He  exhibits no swelling.       Left ankle: He exhibits no swelling.  Left hand deformity  Lymphadenopathy:    He has no cervical adenopathy.  Neurological: He is alert. No cranial nerve deficit.  Skin: Skin is warm. No rash noted. Nails show no clubbing.  Psychiatric: He has a normal mood and affect.      Data Reviewed: Basic Metabolic Panel: Recent Labs  Lab 02/08/18 0505 02/09/18 1119 02/10/18 0414 02/11/18 0548 02/12/18 0016  NA 144 141 143 142 137  K 4.6 3.7 4.7 4.9 3.8  CL 98 95* 97* 97* 93*  CO2 37* 35* 33* 33* 35*  GLUCOSE 86 120* 108* 94 107*  BUN 31* 22* 25* 31* 34*  CREATININE 1.19 1.00 1.09 1.17 1.22  CALCIUM 8.5* 9.1 9.1 9.0 9.0   Cardiac Enzymes: Recent Labs  Lab 02/12/18 0016  TROPONINI 0.04*   BNP (last 3 results) Recent Labs    02/04/18 2256  BNP 216.0*      Recent Results (from the past 240 hour(s))  Respiratory Panel by PCR     Status: None   Collection Time: 02/04/18 11:06 PM  Result Value Ref Range Status   Adenovirus NOT DETECTED NOT DETECTED Final   Coronavirus 229E NOT DETECTED NOT DETECTED Final   Coronavirus HKU1 NOT DETECTED NOT DETECTED Final   Coronavirus NL63 NOT DETECTED NOT DETECTED Final   Coronavirus OC43 NOT DETECTED NOT DETECTED Final   Metapneumovirus NOT DETECTED NOT DETECTED Final  Rhinovirus / Enterovirus NOT DETECTED NOT DETECTED Final   Influenza A NOT DETECTED NOT DETECTED Final   Influenza B NOT DETECTED NOT DETECTED Final   Parainfluenza Virus 1 NOT DETECTED NOT DETECTED Final   Parainfluenza Virus 2 NOT DETECTED NOT DETECTED Final   Parainfluenza Virus 3 NOT DETECTED NOT DETECTED Final   Parainfluenza Virus 4 NOT DETECTED NOT DETECTED Final   Respiratory Syncytial Virus NOT DETECTED NOT DETECTED Final   Bordetella pertussis NOT DETECTED NOT DETECTED Final   Chlamydophila pneumoniae NOT DETECTED NOT DETECTED Final   Mycoplasma pneumoniae NOT DETECTED NOT DETECTED Final    Comment: Performed at [facility], [place] [address], [place], [place] [address]      Scheduled Meds: . albuterol  2.5 mg Nebulization BID  . aspirin EC  81 mg Oral Daily  . brinzolamide  1 drop Both Eyes Daily  . bromocriptine  7.5 mg Oral Daily  . budesonide (PULMICORT) nebulizer solution  0.25 mg Nebulization BID  . ciprofloxacin  2 drop Both Eyes Q4H while awake  . enoxaparin (LOVENOX) injection  40 mg Subcutaneous Q24H  . furosemide  40 mg Intravenous BID  . latanoprost  1 drop Both Eyes QHS  . lisinopril  20 mg Oral Daily  . nystatin  5 mL Oral QID  . pantoprazole  40 mg Oral Daily  . PARoxetine  40 mg Oral Daily  . sodium chloride flush  3 mL Intravenous Q12H   Continuous Infusions:  Assessment/Plan:  1. Acute on chronic diastolic congestive heart failure continue Lasix 40 mg IV twice daily as per cardiology 2. Acute respiratory failure with hypoxia.  This has resolved and patient is breathing comfortably on room air.  The patient does qualify for oxygen at night with desaturations on the overnight oximetry.  The patient likely has underlying sleep apnea.  Will need a sleep study as outpatient in order to make this a diagnosis official.  Will prescribe oxygen nightly. 3. Elevated troponin demand ischemia from congestive heart failure 4. Hypertension on lisinopril 5. Glaucoma on eyedrops 6. History of Klippel-File Syndrome  Code Status:     Code Status Orders  (From admission, onward)         Start     Ordered   02/05/18 0241  Full code  Continuous     02/05/18 0240        Code Status History    This patient has a current code status but no historical code status.     Family Communication: Girlfriend at the bedside Disposition Plan: Evaluate daily for disposition  Consultants:  Cardiology  Time spent: 27 minutes  [place]

## 2018-02-12 NOTE — Progress Notes (Signed)
Pt ambulated in hallway.  Oxygen saturation maintaining between 89 - 92% on RA while ambulating.

## 2018-02-13 ENCOUNTER — Ambulatory Visit: Payer: Medicare Other | Admitting: Pulmonary Disease

## 2018-02-13 ENCOUNTER — Telehealth: Payer: Self-pay | Admitting: Family Medicine

## 2018-02-13 LAB — BASIC METABOLIC PANEL
ANION GAP: 8 (ref 5–15)
BUN: 36 mg/dL — ABNORMAL HIGH (ref 6–20)
CALCIUM: 8.9 mg/dL (ref 8.9–10.3)
CHLORIDE: 96 mmol/L — AB (ref 98–111)
CO2: 36 mmol/L — AB (ref 22–32)
Creatinine, Ser: 1.17 mg/dL (ref 0.61–1.24)
GFR calc Af Amer: >60 mL/min (ref >60)
GFR calc non Af Amer: >60 mL/min (ref >60)
GLUCOSE: 98 mg/dL (ref 70–99)
POTASSIUM: 3.7 mmol/L (ref 3.5–5.1)
Sodium: 140 mmol/L (ref 135–145)

## 2018-02-13 LAB — MAGNESIUM: Magnesium: 2.4 mg/dL (ref 1.7–2.4)

## 2018-02-13 MED ORDER — POTASSIUM CHLORIDE ER 10 MEQ PO TBCR: 10.0000 meq | EXTENDED_RELEASE_TABLET | Freq: Every day | ORAL | 0 refills | Status: DC

## 2018-02-13 MED ORDER — NYSTATIN 100000 UNIT/ML MT SUSP: 5.0000 mL | Freq: Four times a day (QID) | OROMUCOSAL | 0 refills | Status: DC

## 2018-02-13 MED ORDER — CIPROFLOXACIN HCL 0.3 % OP SOLN: 2.0000 [drp] | OPHTHALMIC | 0 refills | Status: DC

## 2018-02-13 MED ORDER — FUROSEMIDE 40 MG PO TABS: 40.0000 mg | ORAL_TABLET | Freq: Two times a day (BID) | ORAL | 0 refills | Status: DC

## 2018-02-13 MED ORDER — POLYVINYL ALCOHOL 1.4 % OP SOLN: 1.0000 [drp] | Freq: Four times a day (QID) | OPHTHALMIC | 0 refills | Status: DC | PRN

## 2018-02-13 MED ORDER — ASPIRIN 81 MG PO TBEC: 81.0000 mg | DELAYED_RELEASE_TABLET | Freq: Every day | ORAL | 0 refills | Status: DC

## 2018-02-13 NOTE — Care Management Important Message (Signed)
Copy of signed IM left with patient in room.  

## 2018-02-13 NOTE — Discharge Summary (Signed)
[facility] NAME: Scott [name]    MR#:  [id number]  DATE OF BIRTH:  [birth year]  DATE OF ADMISSION:  02/04/2018 ADMITTING PHYSICIAN: Lance Coon, MD  DATE OF DISCHARGE: 02/13/2018  2:30 PM  PRIMARY CARE PHYSICIAN: [name], [name], PA    ADMISSION DIAGNOSIS:  Stridor [R06.1] Acute pulmonary edema ([place]) [J81.0] Respiratory distress [R06.03]  DISCHARGE DIAGNOSIS:  Principal Problem:   Accelerated hypertension Active Problems:   Essential (primary) hypertension   HLD (hyperlipidemia)   Klippel-Feil syndrome   Acute systolic CHF (congestive heart failure) (HCC)   Acute diastolic heart failure ([place])   SECONDARY DIAGNOSIS:   Past Medical History:  Diagnosis Date  . Acid reflux   . Anxiety   . Arthritis   . Asthma   . Depression   . Glaucoma   . Hypertension     HOSPITAL COURSE:   1.  Acute on chronic diastolic congestive heart failure.  The patient was diuresed with Lasix 40 mg IV twice a day for the entire hospital course.  I will switch over the Lasix 40 mg p.o. twice daily.  Can go back on the patient's quinapril as outpatient.  The patient was actually on lisinopril here.  Follow-up at the CHF clinic and with cardiology as outpatient 2.  Acute respiratory failure with hypoxia.  The patient was tapered off oxygen during the day and he held his saturations with walking around.  The patient likely has underlying sleep apnea.  Keep the appointment for sleep study as outpatient.  I did an overnight oximetry and the patient did drop down on his saturations and qualified for nighttime oxygen.  Care manager set this up. 3.  Elevated troponin demand ischemia from congestive heart failure 4.  Essential hypertension on quinapril 5.  Glaucoma continue eyedrops 6.  Conjunctivitis prescribed Ciloxan eyedrops 7.  Thrush nystatin swish and swallow ordered.  Likely from the budesonide nebulizers that were prescribed here. 8.  History of  Kippel- Feil syndrome  DISCHARGE CONDITIONS:   Satisfactory  CONSULTS OBTAINED:  Treatment Team:  Sherren Mocha, MD Wellington [place], MD  DRUG ALLERGIES:   Allergies  Allergen Reactions  . Codeine Shortness Of Breath    DISCHARGE MEDICATIONS:   Allergies as of 02/13/2018      Reactions   Codeine Shortness Of Breath      Medication List    STOP taking these medications   hydrochlorothiazide 25 MG tablet Commonly known as:  HYDRODIURIL   ibuprofen 600 MG tablet Commonly known as:  ADVIL,MOTRIN   oxymetazoline 0.05 % nasal spray Commonly known as:  AFRIN   sildenafil 20 MG tablet Commonly known as:  REVATIO     TAKE these medications   albuterol 108 (90 Base) MCG/ACT inhaler Commonly known as:  PROVENTIL HFA;VENTOLIN HFA Inhale 2 puffs into the lungs every 4 (four) hours as needed for wheezing or shortness of breath.   ANDROGEL PUMP 20.25 MG/ACT (1.62%) Gel Generic drug:  Testosterone Apply 1 application topically daily.   aspirin 81 MG EC tablet Take 1 tablet (81 mg total) by mouth daily.   AZOPT 1 % ophthalmic suspension Generic drug:  brinzolamide Place 1 drop into both eyes daily.   bromocriptine 2.5 MG tablet Commonly known as:  PARLODEL Take 7.5 mg by mouth daily.   ciprofloxacin 0.3 % ophthalmic solution Commonly known as:  CILOXAN Place 2 drops into both eyes every 4 (four) hours while awake. Administer 1 drop, every  2 hours, while awake, for 2 days. Then 1 drop, every 4 hours, while awake, for the next 5 days.   furosemide 40 MG tablet Commonly known as:  LASIX Take 1 tablet (40 mg total) by mouth 2 (two) times daily.   nystatin 100000 UNIT/ML suspension Commonly known as:  MYCOSTATIN Take 5 mLs (500,000 Units total) by mouth 4 (four) times daily.   PARoxetine 40 MG tablet Commonly known as:  PAXIL Take 1 tablet (40 mg total) by mouth daily.   polyvinyl alcohol 1.4 % ophthalmic solution Commonly known as:  LIQUIFILM TEARS Place 1  drop into both eyes 4 (four) times daily as needed for dry eyes.   potassium chloride 10 MEQ tablet Commonly known as:  K-DUR Take 1 tablet (10 mEq total) by mouth daily.   quinapril 20 MG tablet Commonly known as:  ACCUPRIL TAKE 1 TABLET BY MOUTH EVERY DAY   Spacer/Aero-Holding Chambers Devi 1 Device by Does not apply route 4 (four) times daily.   VALIUM 2 MG tablet Generic drug:  diazepam Take 1 tablet by mouth as needed.   XALATAN 0.005 % ophthalmic solution Generic drug:  latanoprost Place 1 drop into both eyes at bedtime.            Durable Medical Equipment  (From admission, onward)         Start     Ordered   02/13/18 0920  For home use only DME oxygen  Once    Question Answer Comment  Mode or (Route) Nasal cannula   Liters per Minute 3   Frequency Only at night (stationary unit needed)   Oxygen conserving device Yes   Oxygen delivery system Gas      02/13/18 0920           DISCHARGE INSTRUCTIONS:   Follow-up PMD 5 days Follow-up cardiology 1 week Keep appointment for sleep study CHF clinic follow-up  If you experience worsening of your admission symptoms, develop shortness of breath, life threatening emergency, suicidal or homicidal thoughts you must seek medical attention immediately by calling 911 or calling your MD immediately  if symptoms less severe.  You Must read complete instructions/literature along with all the possible adverse reactions/side effects for all the Medicines you take and that have been prescribed to you. Take any new Medicines after you have completely understood and accept all the possible adverse reactions/side effects.   Please note  You were cared for by a hospitalist during your hospital stay. If you have any questions about your discharge medications or the care you received while you were in the hospital after you are discharged, you can call the unit and asked to speak with the hospitalist on call if the hospitalist  that took care of you is not available. Once you are discharged, your primary care physician will handle any further medical issues. Please note that NO REFILLS for any discharge medications will be authorized once you are discharged, as it is imperative that you return to your primary care physician (or establish a relationship with a primary care physician if you do not have one) for your aftercare needs so that they can reassess your need for medications and monitor your lab values.    Today   CHIEF COMPLAINT:   Chief Complaint  Patient presents with  . Shortness of Breath    HISTORY OF PRESENT ILLNESS:  Scott [name]  is a 47 y.o. male presents with shortness of breath   VITAL SIGNS:  Blood  pressure 134/77, pulse 73, temperature (!) 97.5 F (36.4 C), temperature source Oral, resp. rate 20, height 5\' 2"  (1.575 m), weight 85 kg, SpO2 100 %.   PHYSICAL EXAMINATION:  GENERAL:  47 y.o.-year-old patient lying in the bed with no acute distress.  EYES: Pupils equal, round, reactive to light and accommodation. No scleral icterus. Extraocular muscles intact.  HEENT: Head atraumatic, normocephalic. Oropharynx and nasopharynx clear.  NECK:  Supple, no jugular venous distention. No thyroid enlargement, no tenderness.  LUNGS: Normal breath sounds bilaterally, no wheezing, rales,rhonchi or crepitation. No use of accessory muscles of respiration.  CARDIOVASCULAR: S1, S2 normal. No murmurs, rubs, or gallops.  ABDOMEN: Soft, non-tender, non-distended. Bowel sounds present. No organomegaly or mass.  EXTREMITIES: No pedal edema, cyanosis, or clubbing.  Left hand deformity NEUROLOGIC: Cranial nerves II through XII are intact. Muscle strength 5/5 in all extremities. Sensation intact. Gait not checked.  PSYCHIATRIC: The patient is alert and oriented x 3.  SKIN: No obvious rash, lesion, or ulcer.   DATA REVIEW:    Chemistries  Recent Labs  Lab 02/13/18 0447  NA 140  K 3.7  CL 96*  CO2 36*   GLUCOSE 98  BUN 36*  CREATININE 1.17  CALCIUM 8.9  MG 2.4    Cardiac Enzymes Recent Labs  Lab 02/12/18 0016  TROPONINI 0.04*    Microbiology Results  Results for orders placed or performed during the hospital encounter of 02/04/18  Respiratory Panel by PCR     Status: None   Collection Time: 02/04/18 11:06 PM  Result Value Ref Range Status   Adenovirus NOT DETECTED NOT DETECTED Final   Coronavirus 229E NOT DETECTED NOT DETECTED Final   Coronavirus HKU1 NOT DETECTED NOT DETECTED Final   Coronavirus NL63 NOT DETECTED NOT DETECTED Final   Coronavirus OC43 NOT DETECTED NOT DETECTED Final   Metapneumovirus NOT DETECTED NOT DETECTED Final   Rhinovirus / Enterovirus NOT DETECTED NOT DETECTED Final   Influenza A NOT DETECTED NOT DETECTED Final   Influenza B NOT DETECTED NOT DETECTED Final   Parainfluenza Virus 1 NOT DETECTED NOT DETECTED Final   Parainfluenza Virus 2 NOT DETECTED NOT DETECTED Final   Parainfluenza Virus 3 NOT DETECTED NOT DETECTED Final   Parainfluenza Virus 4 NOT DETECTED NOT DETECTED Final   Respiratory Syncytial Virus NOT DETECTED NOT DETECTED Final   Bordetella pertussis NOT DETECTED NOT DETECTED Final   Chlamydophila pneumoniae NOT DETECTED NOT DETECTED Final   Mycoplasma pneumoniae NOT DETECTED NOT DETECTED Final    Comment: Performed at [facility], [place]. [address] [place], [place] [address]      Management plans discussed with the patient, family and they are in agreement.  CODE STATUS:     Code Status Orders  (From admission, onward)         Start     Ordered   02/05/18 0241  Full code  Continuous     02/05/18 0240        Code Status History    This patient has a current code status but no historical code status.      TOTAL TIME TAKING CARE OF THIS PATIENT: 35 minutes.    Loletha Grayer M.D on 02/13/2018 at 4:30 PM  Between 7am to 6pm - Pager - [phone]  After 6pm go to [email or web address] - Proofreader  [facility]  [phone]  CC: Primary care physician; [name], [name], PA

## 2018-02-13 NOTE — Discharge Instructions (Signed)
Shortness of Breath, Adult Shortness of breath is when a person has trouble breathing enough air, or when a person feels like she or he is having trouble breathing in enough air. Shortness of breath could be a sign of medical problem. Follow these instructions at home: Pay attention to any changes in your symptoms. Take these actions to help with your condition:  Do not smoke. Smoking is a common cause of shortness of breath. If you smoke and you need help quitting, ask your health care provider.  Avoid things that can irritate your airways, such as: ? Mold. ? Dust. ? Air pollution. ? Chemical fumes. ? Things that can cause allergy symptoms (allergens), if you have allergies.  Keep your living space clean and free of mold and dust.  Rest as needed. Slowly return to your usual activities.  Take over-the-counter and prescription medicines, including oxygen and inhaled medicines, only as told by your health care provider.  Keep all follow-up visits as told by your health care provider. This is important.  Contact a health care provider if:  Your condition does not improve as soon as expected.  You have a hard time doing your normal activities, even after you rest.  You have new symptoms. Get help right away if:  Your shortness of breath gets worse.  You have shortness of breath when you are resting.  You feel light-headed or you faint.  You have a cough that is not controlled with medicines.  You cough up blood.  You have pain with breathing.  You have pain in your chest, arms, shoulders, or abdomen.  You have a fever.  You cannot walk up stairs or exercise the way that you normally do. This information is not intended to replace advice given to you by your health care provider. Make sure you discuss any questions you have with your health care provider. Document Released: 12/15/2000 Document Revised: 10/11/2015 Document Reviewed: 08/28/2015 Elsevier Interactive Patient  Education  2018 [employer]vier Inc.  

## 2018-02-13 NOTE — Progress Notes (Signed)
Cardiovascular and Pulmonary Nurse Navigator Note:    Rounded on patient to follow-up with patient and his girlfriend to see if they had any questions about Heart Failure.  Patient has scales and understands he needs to weigh himself every morning after urinating and before eating. Then patient needs to assess weight, compare with previous day, and assess symptoms according to HF Zones and take action if needed.  Patient and girlfriend verbalized understanding.  Patient has "Living Better with Heart Failure" booklet and magnet at bedside and will take home with him as reference.    Patient has a new patient appointment in the [facility] scheduled for 02/16/2018 at 12 noon.  Patient and girlfriend had no further questions at this time.  This RN encouraged patient and girlfriend to write down questions as they think of them in order to address them with the MD when going to appointments post discharge.     Patient stated everyone here at [facility] has been really kind to him and he appreciates this so much.    Roanna Epley, RN, BSN, [facility], [place]? [facility]  Cardiovascular &?Pulmonary Nurse Navigator  Direct Line: [phone]  Department Phone #: [phone] Fax: [phone]? Email Address: [email or web address]

## 2018-02-13 NOTE — Progress Notes (Addendum)
Progress Note  Patient Name: Scott [name] Date of Encounter: 02/13/2018  Primary Cardiologist: [facility], Dr. Fletcher Anon  Subjective   Patient reports improved SOB. Able to lay down flat in bed now / orthopnea improved. No CP, palpitations, or feeling of racing HR.  We discussed plans for discharge and follow-up with Cardiology.  Inpatient Medications    Scheduled Meds: . albuterol  2.5 mg Nebulization BID  . aspirin EC  81 mg Oral Daily  . brinzolamide  1 drop Both Eyes Daily  . bromocriptine  7.5 mg Oral Daily  . budesonide (PULMICORT) nebulizer solution  0.25 mg Nebulization BID  . ciprofloxacin  2 drop Both Eyes Q4H while awake  . enoxaparin (LOVENOX) injection  40 mg Subcutaneous Q24H  . furosemide  40 mg Intravenous BID  . latanoprost  1 drop Both Eyes QHS  . lisinopril  20 mg Oral Daily  . nystatin  5 mL Oral QID  . pantoprazole  40 mg Oral Daily  . PARoxetine  40 mg Oral Daily  . sodium chloride flush  3 mL Intravenous Q12H   Continuous Infusions:  PRN Meds: acetaminophen **OR** acetaminophen, diazepam, hydrALAZINE, labetalol, morphine injection, ondansetron **OR** ondansetron (ZOFRAN) IV, polyvinyl alcohol   Vital Signs    Vitals:   02/13/18 0620 02/13/18 0624 02/13/18 0630 02/13/18 0758  BP: 115/73   134/77  Pulse: 71   73  Resp:      Temp: 98.5 F (36.9 C)   (!) 97.5 F (36.4 C)  TempSrc: Oral   Oral  SpO2: 92%   100%  Weight:  84.3 kg 85 kg   Height:        Intake/Output Summary (Last 24 hours) at 02/13/2018 1138 Last data filed at 02/13/2018 0400 Gross per 24 hour  Intake -  Output 0 ml  Net 0 ml   Filed Weights   02/12/18 0553 02/13/18 0624 02/13/18 0630  Weight: 85.7 kg 84.3 kg 85 kg    Telemetry    Not on telemetry - Personally Reviewed  ECG    No new tracings  Physical Exam   GEN: No acute distress. Accompanied by girlfriend Neck: JVP not elevated Cardiac: RRR, no murmurs, no rubs, or gallops.  Respiratory: Trace bibasilar  rales much improved since admission and otherwise clear to auscultation bilaterally. No extra work with breathing. GI: Soft, nontender, non-distended  MS: No b/l edema; congenital abnormalities of chest, L hand, neck Neuro:  Nonfocal  Psych: Normal affect   Labs    Chemistry Recent Labs  Lab 02/11/18 0548 02/12/18 0016 02/13/18 0447  NA 142 137 140  K 4.9 3.8 3.7  CL 97* 93* 96*  CO2 33* 35* 36*  GLUCOSE 94 107* 98  BUN 31* 34* 36*  CREATININE 1.17 1.22 1.17  CALCIUM 9.0 9.0 8.9  GFRNONAA >60 >60 >60  GFRAA >60 >60 >60  ANIONGAP 12 9 8      HematologyNo results for input(s): WBC, RBC, HGB, HCT, MCV, MCH, MCHC, RDW, PLT in the last 168 hours.  Cardiac Enzymes Recent Labs  Lab 02/12/18 0016  TROPONINI 0.04*   No results for input(s): TROPIPOC in the last 168 hours.   BNPNo results for input(s): BNP, PROBNP in the last 168 hours.   DDimer No results for input(s): DDIMER in the last 168 hours.   Radiology    No results found.  Cardiac Studies   NM  02/09/2018 Pharmacological myocardial perfusion imaging study with no significant  Ischemia Small region  of fixed apical defect, likely attenuation artifact. Normal wall motion, EF estimated at 35% (depressed secondary to GI uptake artifact) No EKG changes concerning for ischemia at peak stress or in recovery. Low risk scan, normal EF on echocardiogram, >55%   Echo (02/05/18): - Left ventricle: The cavity size was normal. Wall thickness was normal. Systolic function was normal. The estimated ejection fraction was in the range of 55% to 60%. Wall motion was normal; there were no regional wall motion abnormalities. Doppler parameters are consistent with abnormal left ventricular relaxation (grade 1 diastolic dysfunction). - Left atrium: The atrium was normal in size. - Right ventricle: Systolic function was normal. - Pulmonary arteries: Systolic pressure could not be accurately estimated.   Patient  Profile     47 y.o. 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  Assessment & Plan   1. Acute on chronic HFpEF - Admitted with SOB and orthopnea, which improved with IV diuresis this admission. 02/2018 CV studies with echo showing normal EF 55-60%, G1DD. NM study as above negative for significant ischemia.  - Lifestyle changes recommended included DASH diet. Patient O2 sats decreased with ambulation but not enough for daytime O2 though does qualify for PM O2. - Outpatient sleep study recommended, home PM O2  - Continue medical management with ASA 81mg , lisinopril, lasix 40mg  BID, KCl 34mEq po qd supplementation  - Cardiology outpatient follow-up scheduled for 03/01/18 with Christell Faith, PA-C   2. HTN - Improved with SBP in the 130s - Lifestyle changes and medical management recommended as above - Recommendation that take BP at home and record, daily weights as on diuretic  3. Elevated troponin - Likely 2/2 demand ischemia in the setting of volume overload, HFpEF, HTN - Stress test negative for ischemia  - No further ischemic workup necessary  For questions or updates, please contact [facility] Please consult [email or web address] for contact info under        Signed, Arvil Chaco, PA-C  02/13/2018, 11:38 AM

## 2018-02-13 NOTE — Telephone Encounter (Signed)
Acknowledged.

## 2018-02-13 NOTE — Telephone Encounter (Signed)
Please review

## 2018-02-13 NOTE — Telephone Encounter (Signed)
Pt is scheduled for hospital F/U on 02/20/18. Pt is being discharged from hospital today. Please advise. Thanks TNP

## 2018-02-14 ENCOUNTER — Ambulatory Visit: Payer: Medicare Other | Admitting: Family

## 2018-02-14 ENCOUNTER — Telehealth: Payer: Self-pay

## 2018-02-14 NOTE — Telephone Encounter (Signed)
I have made the 1st attempt to contact the patient or family member in charge, in order to follow up from recently being discharged from the hospital. I left a message on voicemail requesting a CB. -MM 

## 2018-02-15 NOTE — Telephone Encounter (Signed)
I have made the 2nd attempt to contact the patient or family member in charge, in order to follow up from recently being discharged from the hospital. I left a message on voicemail requesting a call back and also reminded pt of HFU apt scheduled for 02/20/18 @ 1:20 PM. -MM

## 2018-02-16 ENCOUNTER — Encounter: Payer: Self-pay | Admitting: Family

## 2018-02-16 ENCOUNTER — Encounter: Payer: Self-pay | Admitting: Family Medicine

## 2018-02-16 ENCOUNTER — Telehealth: Payer: Self-pay | Admitting: Family Medicine

## 2018-02-16 ENCOUNTER — Ambulatory Visit: Payer: Medicare Other | Attending: Family | Admitting: Family

## 2018-02-16 VITALS — BP 144/94 | HR 97 | Resp 18 | Ht 62.0 in | Wt 189.2 lb

## 2018-02-16 DIAGNOSIS — Z885 Allergy status to narcotic agent status: Secondary | ICD-10-CM | POA: Diagnosis not present

## 2018-02-16 DIAGNOSIS — I5032 Chronic diastolic (congestive) heart failure: Secondary | ICD-10-CM | POA: Insufficient documentation

## 2018-02-16 DIAGNOSIS — I110 Hypertensive heart disease with heart failure: Secondary | ICD-10-CM | POA: Diagnosis not present

## 2018-02-16 DIAGNOSIS — E785 Hyperlipidemia, unspecified: Secondary | ICD-10-CM | POA: Insufficient documentation

## 2018-02-16 DIAGNOSIS — F329 Major depressive disorder, single episode, unspecified: Secondary | ICD-10-CM | POA: Diagnosis not present

## 2018-02-16 DIAGNOSIS — F419 Anxiety disorder, unspecified: Secondary | ICD-10-CM | POA: Diagnosis not present

## 2018-02-16 DIAGNOSIS — Z79899 Other long term (current) drug therapy: Secondary | ICD-10-CM | POA: Insufficient documentation

## 2018-02-16 DIAGNOSIS — Q761 Klippel-Feil syndrome: Secondary | ICD-10-CM | POA: Insufficient documentation

## 2018-02-16 DIAGNOSIS — R0602 Shortness of breath: Secondary | ICD-10-CM | POA: Diagnosis present

## 2018-02-16 DIAGNOSIS — I10 Essential (primary) hypertension: Secondary | ICD-10-CM

## 2018-02-16 DIAGNOSIS — Z8249 Family history of ischemic heart disease and other diseases of the circulatory system: Secondary | ICD-10-CM | POA: Insufficient documentation

## 2018-02-16 DIAGNOSIS — J45909 Unspecified asthma, uncomplicated: Secondary | ICD-10-CM | POA: Diagnosis not present

## 2018-02-16 DIAGNOSIS — Z87891 Personal history of nicotine dependence: Secondary | ICD-10-CM | POA: Insufficient documentation

## 2018-02-16 DIAGNOSIS — Z7982 Long term (current) use of aspirin: Secondary | ICD-10-CM | POA: Diagnosis not present

## 2018-02-16 NOTE — Telephone Encounter (Signed)
E-mail reminder sent. -MM

## 2018-02-16 NOTE — Telephone Encounter (Signed)
If no response to one more attempt, send reminder letter. Thanks.

## 2018-02-16 NOTE — Patient Instructions (Signed)
Continue weighing daily and call for an overnight weight gain of > 2 pounds or a weekly weight gain of >5 pounds. 

## 2018-02-16 NOTE — Telephone Encounter (Signed)
Pt did not return my calls or VMs. HFU scheduled for 02/20/18 @ 1:20. FYI! -MM

## 2018-02-16 NOTE — Progress Notes (Signed)
Patient ID: Scott [name], male    DOB: 02-22-1971, 47 y.o.   MRN: [id number]  HPI  Mr Blaisdell is a 47 y/o male with a history of hyperlipidemia, HTN, anxiety, arthritis, depression, klippel-feil syndrome, former tobacco use and chronic heart failure.   Echo report from 02/05/18 reviewed and showed an EF of 55-60%  Admitted 02/04/18 due to acute on chronic HF. Initially needed IV lasix and then transitioned to oral diuretics. Also needed oxygen initially but was then tapered off. Saturations did drop at night so he qualified for nighttime oxygen. Sleep study supposed to be done to rule out sleep apnea. Elevated troponins thought to be due to demand ischemia. Cardiology consult obtained. Discharged after 9 days.   He presents today for his initial visit with a chief complaint of minimal shortness of breath upon moderate exertion. He describes this as chronic in nature having been present for several years. He has associated anxiety along with this. He denies any difficulty sleeping, abdominal distention, palpitations, pedal edema,chest pain, fatigue, dizziness or weight gain. Overall, he says that he feels much better than he was feeling. Says that he's eating ~ 3000mg  of potassium daily in the foods that he eats and that his potassium level has been within normal range.   Past Medical History:  Diagnosis Date  . Acid reflux   . Anxiety   . Arthritis   . Asthma   . CHF (congestive heart failure) ([place])   . Depression   . Glaucoma   . Hyperlipidemia   . Hypertension   . Klippel-Feil syndrome    Past Surgical History:  Procedure Laterality Date  . COLOSTOMY    . DENVER SHUNT PLACEMENT     Family History  Problem Relation Age of Onset  . Multiple sclerosis Mother   . Colon cancer Father   . Heart disease Father   . Lung cancer Maternal Grandmother   . Thyroid cancer Maternal Grandfather   . Ovarian cancer Paternal Grandmother   . Cancer Paternal Grandfather    Social History   Tobacco  Use  . Smoking status: Former Smoker    Packs/day: 1.00    Years: 10.00    Pack years: 10.00    Types: Cigarettes    Last attempt to quit: 05/2006    Years since quitting: 11.7  . Smokeless tobacco: Never Used  . Tobacco comment: quit 05/2006  Substance Use Topics  . Alcohol use: No    Alcohol/week: 0.0 standard drinks   Allergies  Allergen Reactions  . Codeine Shortness Of Breath   Prior to Admission medications   Medication Sig Start Date End Date Taking? Authorizing Provider  albuterol (PROVENTIL HFA;VENTOLIN HFA) 108 (90 Base) MCG/ACT inhaler Inhale 2 puffs into the lungs every 4 (four) hours as needed for wheezing or shortness of breath. 01/11/18  Yes Chauvin, Herbie [place], PA  ANDROGEL PUMP 20.25 MG/ACT (1.62%) GEL Apply 1 application topically daily.  09/04/14  Yes [provider]  aspirin EC 81 MG EC tablet Take 1 tablet (81 mg total) by mouth daily. 02/13/18  Yes Wieting, Richard, MD  brinzolamide (AZOPT) 1 % ophthalmic suspension Place 1 drop into both eyes daily.    Yes [provider]  bromocriptine (PARLODEL) 2.5 MG tablet Take 7.5 mg by mouth daily.  09/10/14  Yes [provider]  ciprofloxacin (CILOXAN) 0.3 % ophthalmic solution Place 2 drops into both eyes every 4 (four) hours while awake. Administer 1 drop, every 2 hours, while awake, for  2 days. Then 1 drop, every 4 hours, while awake, for the next 5 days. 02/13/18  Yes Wieting, Richard, MD  diazepam (VALIUM) 2 MG tablet Take 1 tablet by mouth as needed.   Yes [provider]  furosemide (LASIX) 40 MG tablet Take 1 tablet (40 mg total) by mouth 2 (two) times daily. 02/13/18  Yes Wieting, Richard, MD  latanoprost (XALATAN) 0.005 % ophthalmic solution Place 1 drop into both eyes at bedtime.  07/28/07  Yes [provider]  nystatin (MYCOSTATIN) 100000 UNIT/ML suspension Take 5 mLs (500,000 Units total) by mouth 4 (four) times daily. 02/13/18  Yes Wieting, Richard, MD  PARoxetine (PAXIL) 40  MG tablet Take 1 tablet (40 mg total) by mouth daily. 09/29/17  Yes [name], [name], PA  polyvinyl alcohol (LIQUIFILM TEARS) 1.4 % ophthalmic solution Place 1 drop into both eyes 4 (four) times daily as needed for dry eyes. 02/13/18  Yes Wieting, Richard, MD  quinapril (ACCUPRIL) 20 MG tablet TAKE 1 TABLET BY MOUTH EVERY DAY 12/02/17  Yes [name], [name], PA  Spacer/Aero-Holding Chambers DEVI 1 Device by Does not apply route 4 (four) times daily. 01/26/18  Yes Tyler Pita, MD  potassium chloride (K-DUR) 10 MEQ tablet Take 1 tablet (10 mEq total) by mouth daily. Patient not taking: Reported on 02/16/2018 02/13/18   Loletha Grayer, MD    Review of Systems  Constitutional: Negative for appetite change and fatigue.  HENT: Negative for congestion, postnasal drip and sore throat.   Eyes: Negative.   Respiratory: Positive for shortness of breath (with moderate exertion). Negative for chest tightness.   Cardiovascular: Negative for chest pain, palpitations and leg swelling.  Gastrointestinal: Negative for abdominal distention and abdominal pain.  Endocrine: Negative.   Genitourinary: Negative.   Musculoskeletal: Negative for back pain and neck pain.  Skin: Negative.   Allergic/Immunologic: Negative.   Neurological: Negative for dizziness and light-headedness.  Hematological: Negative for adenopathy. Does not bruise/bleed easily.  Psychiatric/Behavioral: Negative for dysphoric mood and sleep disturbance (sleeping on 1 pillow). The patient is nervous/anxious.     Vitals:   02/16/18 1216  BP: (!) 144/94  Pulse: 97  Resp: 18  SpO2: 93%  Weight: 189 lb 4 oz (85.8 kg)  Height: 5\' 2"  (1.575 m)   Wt Readings from Last 3 Encounters:  02/16/18 189 lb 4 oz (85.8 kg)  02/13/18 187 lb 6.4 oz (85 kg)  01/25/18 196 lb 3.2 oz (89 kg)   Lab Results  Component Value Date   CREATININE 1.17 02/13/2018   CREATININE 1.22 02/12/2018   CREATININE 1.17 02/11/2018    Physical Exam   Constitutional: He is oriented to person, place, and time. He appears well-developed and well-nourished.  HENT:  Head: Normocephalic and atraumatic.  Neck: Normal range of motion. Neck supple. No JVD present.  Cardiovascular: Normal rate and regular rhythm.  Pulmonary/Chest: Effort normal. No respiratory distress. He has no wheezes. He has no rales.  Abdominal: Soft. He exhibits no distension. There is no tenderness.  Musculoskeletal: He exhibits no edema or tenderness.  Neurological: He is alert and oriented to person, place, and time.  Skin: Skin is warm and dry.  Psychiatric: He has a normal mood and affect. His behavior is normal. Thought content normal.  Nursing note and vitals reviewed.  Assessment & Plan:  1: Chronic heart failure with preserved ejection fraction- - NYHA class II - euvolemic today - weighing daily; instructed to call for an overnight weight gain of >2 pounds  or a weekly weight gain of >5 pounds - not adding salt and has been reading food labels. Reviewed the importance of closely following a 2000mg  sodium diet and written dietary information was given to him about this. - has echo scheduled next week - sees cardiology (Dunn) 03/01/18 - BNP on 02/04/18 was 216.0 - patient does not get the flu vaccine; good handwashing encouraged  2: HTN- - BP mildly elevated today and his girlfriend thinks it could be related to anxiety about being at a new place - discussed changing his accupril to something else or titrating up accupril - saw PCP (Chauvin) 01/11/18 - BMP from 02/13/18 reviewed and showed sodium 140, potassium 3.7, creatinine 1.17 and GFR >60  3: Klippel-Fiel syndrome- - saw pulmonology Patsey Berthold) 01/25/18 - has PFT's scheduled 02/21/18  Medication list was reviewed.  Return in 6 weeks or sooner for any questions/problems before then.

## 2018-02-16 NOTE — Telephone Encounter (Signed)
I left a message asking the pt to call me to schedule AWV-I w/ [facility] McKenzie. I also explained that he has an appointment on 02/20/18 at 1:20. VDM (DD)

## 2018-02-17 ENCOUNTER — Telehealth: Payer: Self-pay | Admitting: Family

## 2018-02-17 MED ORDER — TORSEMIDE 20 MG PO TABS: 40.0000 mg | ORAL_TABLET | Freq: Two times a day (BID) | ORAL | 3 refills | Status: DC

## 2018-02-17 NOTE — Telephone Encounter (Signed)
Patient called to say that he's gained 2.6 pounds overnight. Yesterday his home weight was 186 pounds and today he weighed 188.6 pounds. He also feels like he might be more short of breath today than yesterday.   Will stop his furosemide and begin torsemide 40mg  twice daily. Has an appointment with PCP on 02/20/18 who can evaluate the effectiveness of diuretic change.  Patient verbalized understanding of medication change.

## 2018-02-18 LAB — BLOOD GAS, ARTERIAL
ACID-BASE EXCESS: 3.9 mmol/L — AB (ref 0.0–2.0)
Bicarbonate: 29.2 mmol/L — ABNORMAL HIGH (ref 20.0–28.0)
O2 SAT: 97.3 %
PCO2 ART: 45 mmHg (ref 32.0–48.0)
PH ART: 7.42 (ref 7.350–7.450)
Patient temperature: 37
pO2, Arterial: 92 mmHg (ref 83.0–108.0)

## 2018-02-20 ENCOUNTER — Other Ambulatory Visit: Payer: Self-pay

## 2018-02-20 ENCOUNTER — Ambulatory Visit (INDEPENDENT_AMBULATORY_CARE_PROVIDER_SITE_OTHER): Payer: Medicare Other | Admitting: Family Medicine

## 2018-02-20 ENCOUNTER — Telehealth: Payer: Self-pay | Admitting: Licensed Clinical Social Worker

## 2018-02-20 ENCOUNTER — Encounter: Payer: Self-pay | Admitting: Family Medicine

## 2018-02-20 VITALS — BP 120/84 | HR 62 | Temp 98.0°F | Ht 62.0 in | Wt 188.4 lb

## 2018-02-20 DIAGNOSIS — Z23 Encounter for immunization: Secondary | ICD-10-CM | POA: Diagnosis not present

## 2018-02-20 DIAGNOSIS — I5032 Chronic diastolic (congestive) heart failure: Secondary | ICD-10-CM | POA: Diagnosis not present

## 2018-02-20 DIAGNOSIS — I10 Essential (primary) hypertension: Secondary | ICD-10-CM

## 2018-02-20 NOTE — Telephone Encounter (Signed)
EMMI flagged patient for answering yes to feeling sad/hopeless/anxious/empty. Clinical Education officer, museum (CSW) was able to reach patient via telephone. Patient scored 2 on the PHQ-9. Per patient he has a history of depression and goes to see Dr. Rosine Door in [place] for it. Per patient he is feeling good and is doing well. Per patient his depression is not any worse then when he first come into [facility]. Patient reported that he is not having thoughts of hurting himself. CSW provided emotional support. Patient reported no needs or concerns.   [employer], LCSW [phone]

## 2018-02-20 NOTE — Progress Notes (Signed)
Patient: Scott [name] Male    DOB: [birth year]   47 y.o.   MRN: [id number] Visit Date: 02/20/2018  Today's Provider: [name], PA   Chief Complaint  Patient presents with  . Hospitalization Follow-up    diagnosed with CHF   Subjective:    HPI  Pt reports he is here for a hospital follow up.  Pt was admitted for SOB and went in on 02/04/18 and released on 02/13/18.  Pt states that he was diagnosed with CHF.  Pt reports that he feels a lot better than he has in months.       Past Medical History:  Diagnosis Date  . Acid reflux   . Anxiety   . Arthritis   . Asthma   . CHF (congestive heart failure) ([place])   . Depression   . Glaucoma   . Hyperlipidemia   . Hypertension   . Klippel-Feil syndrome    Past Surgical History:  Procedure Laterality Date  . COLOSTOMY    . DEN[place] SHUNT PLACEMENT     Family History  Problem Relation Age of Onset  . Multiple sclerosis Mother   . Colon cancer Father   . Heart disease Father   . Lung cancer Maternal Grandmother   . Thyroid cancer Maternal Grandfather   . Ovarian cancer Paternal Grandmother   . Cancer Paternal Grandfather    Allergies  Allergen Reactions  . Codeine Shortness Of Breath    Current Outpatient Medications:  .  albuterol (PROVENTIL HFA;VENTOLIN HFA) 108 (90 Base) MCG/ACT inhaler, Inhale 2 puffs into the lungs every 4 (four) hours as needed for wheezing or shortness of breath., Disp: 1 Inhaler, Rfl: 2 .  ANDROGEL PUMP 20.25 MG/ACT (1.62%) GEL, Apply 1 application topically daily. , Disp: , Rfl:  .  brinzolamide (AZOPT) 1 % ophthalmic suspension, Place 1 drop into both eyes daily. , Disp: , Rfl:  .  bromocriptine (PARLODEL) 2.5 MG tablet, Take 7.5 mg by mouth daily. , Disp: , Rfl: 0 .  ciprofloxacin (CILOXAN) 0.3 % ophthalmic solution, Place 2 drops into both eyes every 4 (four) hours while awake. Administer 1 drop, every 2 hours, while awake, for 2 days. Then 1 drop, every 4 hours, while awake, for the next  5 days., Disp: 5 mL, Rfl: 0 .  diazepam (VALIUM) 2 MG tablet, Take 1 tablet by mouth as needed., Disp: , Rfl:  .  latanoprost (XALATAN) 0.005 % ophthalmic solution, Place 1 drop into both eyes at bedtime. , Disp: , Rfl:  .  nystatin (MYCOSTATIN) 100000 UNIT/ML suspension, Take 5 mLs (500,000 Units total) by mouth 4 (four) times daily., Disp: 60 mL, Rfl: 0 .  PARoxetine (PAXIL) 40 MG tablet, Take 1 tablet (40 mg total) by mouth daily., Disp: 30 tablet, Rfl: 0 .  polyvinyl alcohol (LIQUIFILM TEARS) 1.4 % ophthalmic solution, Place 1 drop into both eyes 4 (four) times daily as needed for dry eyes., Disp: 15 mL, Rfl: 0 .  quinapril (ACCUPRIL) 20 MG tablet, TAKE 1 TABLET BY MOUTH EVERY DAY, Disp: 90 tablet, Rfl: 0 .  Spacer/Aero-Holding Chambers DEVI, 1 Device by Does not apply route 4 (four) times daily., Disp: 1 each, Rfl: 0 .  torsemide (DEMADEX) 20 MG tablet, Take 2 tablets (40 mg total) by mouth 2 (two) times daily., Disp: 120 tablet, Rfl: 3 .  aspirin EC 81 MG EC tablet, Take 1 tablet (81 mg total) by mouth daily. (Patient not taking: Reported on 02/20/2018),  Disp: 30 tablet, Rfl: 0 .  potassium chloride (K-DUR) 10 MEQ tablet, Take 1 tablet (10 mEq total) by mouth daily. (Patient not taking: Reported on 02/16/2018), Disp: 30 tablet, Rfl: 0  Review of Systems  Constitutional: Negative.   HENT: Negative.   Eyes: Negative.   Respiratory: Negative.   Cardiovascular: Negative.   Gastrointestinal: Negative.   Endocrine: Negative.   Genitourinary: Negative.   Musculoskeletal: Negative.   Skin: Negative.   Allergic/Immunologic: Negative.   Neurological: Negative.   Hematological: Negative.   Psychiatric/Behavioral: Negative.    Social History   Tobacco Use  . Smoking status: Former Smoker    Packs/day: 1.00    Years: 10.00    Pack years: 10.00    Types: Cigarettes    Last attempt to quit: 05/2006    Years since quitting: 11.8  . Smokeless tobacco: Never Used  . Tobacco comment: quit  05/2006  Substance Use Topics  . Alcohol use: No    Alcohol/week: 0.0 standard drinks   Objective:   BP 120/84 (BP Location: Right Arm, Patient Position: Sitting, Cuff Size: Normal)   Pulse 62   Temp 98 F (36.7 C) (Oral)   Ht 5\' 2"  (1.575 m)   Wt 188 lb 6.4 oz (85.5 kg)   SpO2 97%   BMI 34.46 kg/m   Wt Readings from Last 3 Encounters:  02/20/18 188 lb 6.4 oz (85.5 kg)  02/16/18 189 lb 4 oz (85.8 kg)  02/13/18 187 lb 6.4 oz (85 kg)   Vitals:   02/20/18 1338  BP: 120/84  Pulse: 62  Temp: 98 F (36.7 C)  TempSrc: Oral  SpO2: 97%  Weight: 188 lb 6.4 oz (85.5 kg)  Height: 5\' 2"  (1.575 m)   Physical Exam  Constitutional: He is oriented to person, place, and time. He appears well-developed and well-nourished. No distress.  HENT:  Head: Normocephalic and atraumatic.  Right Ear: Hearing normal.  Left Ear: Hearing normal.  Nose: Nose normal.  Eyes: Conjunctivae and lids are normal. Right eye exhibits no discharge. Left eye exhibits no discharge. No scleral icterus.  Cardiovascular: Normal rate and regular rhythm.  Pulmonary/Chest: Effort normal. No respiratory distress.  Musculoskeletal: Normal range of motion.  Neurological: He is alert and oriented to person, place, and time.  Skin: Skin is intact. No lesion and no rash noted.  Psychiatric: He has a normal mood and affect. His speech is normal and behavior is normal. Thought content normal.      Assessment & Plan:     1. Chronic diastolic heart failure ([place]) Hospitalized 02-04-09 and discharged 02-12-18 with diagnosis of acute on chronic diastolic congestive heart failure. Still taking Accupril 20 mg qd and Torsemide 20 mg BID with KCL 20 meq qd. Feeling more energetic and edema of feet and ankles resolved. Scheduled to follow up with [facility] cardiology Christell Faith, PA-C) on 03-01-18. Had acute respiratory failure with hypoxia during hospitalization. Tapered off the oxygen and scheduled for evaluation at the Heart Failure clinic  [facility]) and will get pulmonary function tests soon. Will recheck renal function, electrolytes and CBC today. Proceed with follow up appointments a scheduled. - CBC with Differential/Platelet - Basic Metabolic Panel (BMET)  2. Essential (primary) hypertension BP well controlled on the Accupril 20 mg qd, now. Restrict sodium/salt intake and recheck labs. - CBC with Differential/Platelet - Basic Metabolic Panel (BMET)  3. Need for Tdap vaccination Willing to get his Tdap up to date. Refuses pneumonia and flu vaccinations today. -Tdap vaccine  greater than or equal to 7yo IM      [name], PA  [facility]

## 2018-02-21 ENCOUNTER — Ambulatory Visit: Payer: Medicare Other

## 2018-02-21 ENCOUNTER — Ambulatory Visit: Payer: Medicare Other | Attending: Pulmonary Disease

## 2018-02-21 DIAGNOSIS — M419 Scoliosis, unspecified: Secondary | ICD-10-CM | POA: Insufficient documentation

## 2018-02-21 DIAGNOSIS — J984 Other disorders of lung: Secondary | ICD-10-CM | POA: Insufficient documentation

## 2018-02-21 LAB — CBC WITH DIFFERENTIAL/PLATELET
BASOS ABS: 0.1 10*3/uL (ref 0.0–0.2)
Basos: 1 %
EOS (ABSOLUTE): 0.4 10*3/uL (ref 0.0–0.4)
EOS: 3 %
Hematocrit: 48.2 % (ref 37.5–51.0)
Hemoglobin: 16.6 g/dL (ref 13.0–17.7)
IMMATURE GRANULOCYTES: 0 %
Immature Grans (Abs): 0 10*3/uL (ref 0.0–0.1)
Lymphocytes Absolute: 2.5 10*3/uL (ref 0.7–3.1)
Lymphs: 24 %
MCH: 30 pg (ref 26.6–33.0)
MCHC: 34.4 g/dL (ref 31.5–35.7)
MCV: 87 fL (ref 79–97)
MONOS ABS: 1.2 10*3/uL — AB (ref 0.1–0.9)
Monocytes: 12 %
NEUTROS PCT: 60 %
Neutrophils Absolute: 6.4 10*3/uL (ref 1.4–7.0)
PLATELETS: 355 10*3/uL (ref 150–450)
RBC: 5.54 x10E6/uL (ref 4.14–5.80)
RDW: 12.5 % (ref 12.3–15.4)
WBC: 10.6 10*3/uL (ref 3.4–10.8)

## 2018-02-21 LAB — BASIC METABOLIC PANEL
BUN/Creatinine Ratio: 15 (ref 9–20)
BUN: 20 mg/dL (ref 6–24)
CALCIUM: 10 mg/dL (ref 8.7–10.2)
CHLORIDE: 90 mmol/L — AB (ref 96–106)
CO2: 32 mmol/L — AB (ref 20–29)
Creatinine, Ser: 1.36 mg/dL — ABNORMAL HIGH (ref 0.76–1.27)
GFR calc Af Amer: 71 mL/min/{1.73_m2} (ref >59)
GFR calc non Af Amer: 62 mL/min/{1.73_m2} (ref >59)
GLUCOSE: 75 mg/dL (ref 65–99)
POTASSIUM: 4.3 mmol/L (ref 3.5–5.2)
Sodium: 140 mmol/L (ref 134–144)

## 2018-02-21 MED ORDER — ALBUTEROL SULFATE (2.5 MG/3ML) 0.083% IN NEBU
2.5000 mg | INHALATION_SOLUTION | Freq: Once | RESPIRATORY_TRACT | Status: AC
  Administered 2018-02-21: 2.5 mg via RESPIRATORY_TRACT
  Filled 2018-02-21: qty 3

## 2018-02-22 ENCOUNTER — Telehealth: Payer: Self-pay

## 2018-02-22 DIAGNOSIS — I10 Essential (primary) hypertension: Secondary | ICD-10-CM | POA: Diagnosis not present

## 2018-02-22 DIAGNOSIS — Q02 Microcephaly: Secondary | ICD-10-CM | POA: Diagnosis not present

## 2018-02-22 DIAGNOSIS — E291 Testicular hypofunction: Secondary | ICD-10-CM | POA: Diagnosis not present

## 2018-02-22 DIAGNOSIS — F4322 Adjustment disorder with anxiety: Secondary | ICD-10-CM | POA: Diagnosis not present

## 2018-02-22 DIAGNOSIS — E221 Hyperprolactinemia: Secondary | ICD-10-CM | POA: Diagnosis not present

## 2018-02-22 DIAGNOSIS — E785 Hyperlipidemia, unspecified: Secondary | ICD-10-CM | POA: Diagnosis not present

## 2018-02-22 DIAGNOSIS — Q540 Hypospadias, balanic: Secondary | ICD-10-CM | POA: Diagnosis not present

## 2018-02-22 DIAGNOSIS — Z79899 Other long term (current) drug therapy: Secondary | ICD-10-CM | POA: Diagnosis not present

## 2018-02-22 DIAGNOSIS — F331 Major depressive disorder, recurrent, moderate: Secondary | ICD-10-CM | POA: Diagnosis not present

## 2018-02-22 DIAGNOSIS — Q761 Klippel-Feil syndrome: Secondary | ICD-10-CM | POA: Diagnosis not present

## 2018-02-22 NOTE — Telephone Encounter (Signed)
Patient hasnt heard a response about an appt with Velda Shell aware and will reach out

## 2018-02-28 NOTE — Progress Notes (Signed)
Cardiology Office Note Date:  03/01/2018  Patient ID:  Scott [name], Scott [name] 1970/08/02, MRN [id number] PCP:  Margo Common, PA  Cardiologist:  Dr. Fletcher Anon, MD    Chief Complaint: Hospital follow up  History of Present Illness: Scott [name] is a 47 y.o. male with history of congenital abnormality (Klippel-File syndrome) with restrictive lung disease and airway hyperactivity, diastolic CHF, hypertension, hyperlipidemia, asthma, alopecia, migraine disorder, open-angle glaucoma, depression, arthritis, anxiety, and GERD who presents for hospital follow-up after recent admission to [facility] from 11/3 through 11/11 for acute respiratory distress with hypoxia in the setting of severe underlying restrictive lung disease and acute diastolic CHF.  Patient was admitted to the hospital on 11/3 with a several week history of progressive shortness of breath, edema, orthopnea, and PND.  Prior to this admission he had no previously known cardiac history.  Chest x-ray showed cardiomegaly with mild pulmonary vascular congestion.  BNP was noted to be 216.  Troponin peaked at 0.06 and was felt to be demand ischemia in the setting of poorly controlled hypertension and volume overload.  Symptoms improved with IV diuresis.  Echo showed an EF of 55 to 60%, normal wall motion, grade 1 diastolic dysfunction, normal sized left atrium, RV systolic function normal, unable to accurately estimate PASP.  Discharge labs showed a potassium of 3.7, serum creatinine 1.17, LDL 85, A1c 5.7, unremarkable CBC, normal TSH.  Documented discharge weight 85 mg.  He was seen by the [facility] CHF clinic on 02/16/2018 with a blood pressure of 144/94, weight 189 pounds (85.8 kg).  No changes were made.  He followed up with PCP on 02/20/2018 and reported he felt better than he had in several months.  BP well controlled at 120/84.  Weight 188 pounds (85.5 kg).  Labs checked at that time showed a bump in his serum creatinine from 1.17-1.36, BUN 20,  potassium 4.3, unremarkable CBC.  PFTs on 02/21/2018 showed severe restrictive lung disease with positive bronchodilator response which was felt to possibly reflect underlying reactive airway disease.  He does not have an appointment scheduled with pulmonology at this time.  He comes in doing well today.  No further shortness of breath.  No chest pain.  Weights have remained relatively stable at home ranging from 184 to 187 pounds.  He is drinking approximately 1 L of fluids daily.  He is limiting his salt intake.  He is compliant with medications.  Blood pressure typically runs in the 846N to 629B systolic at home.  He denies any lower extremity swelling, orthopnea, or early satiety.  He has had issues finding his inhaler spacer at a local pharmacy.  No dizziness, presyncope, or syncope.  Past Medical History:  Diagnosis Date  . Acid reflux   . Anxiety   . Arthritis   . Asthma   . CHF (congestive heart failure) ([place])   . Depression   . Glaucoma   . Hyperlipidemia   . Hypertension   . Klippel-Feil syndrome     Past Surgical History:  Procedure Laterality Date  . COLOSTOMY    . DENVER SHUNT PLACEMENT      Current Meds  Medication Sig  . albuterol (PROVENTIL HFA;VENTOLIN HFA) 108 (90 Base) MCG/ACT inhaler Inhale 2 puffs into the lungs every 4 (four) hours as needed for wheezing or shortness of breath.  . ANDROGEL PUMP 20.25 MG/ACT (1.62%) GEL Apply 1 application topically daily.   Marland Kitchen aspirin EC 81 MG EC tablet Take 1 tablet (81 mg total)  by mouth daily.  . brinzolamide (AZOPT) 1 % ophthalmic suspension Place 1 drop into both eyes daily.   . bromocriptine (PARLODEL) 2.5 MG tablet Take 7.5 mg by mouth daily.   . diazepam (VALIUM) 2 MG tablet Take 1 tablet by mouth as needed.  . latanoprost (XALATAN) 0.005 % ophthalmic solution Place 1 drop into both eyes at bedtime.   Marland Kitchen PARoxetine (PAXIL) 40 MG tablet Take 1 tablet (40 mg total) by mouth daily.  . potassium chloride (K-DUR) 10 MEQ tablet  Take 1 tablet (10 mEq total) by mouth daily.  . quinapril (ACCUPRIL) 20 MG tablet TAKE 1 TABLET BY MOUTH EVERY DAY  . torsemide (DEMADEX) 20 MG tablet Take 2 tablets (40 mg total) by mouth 2 (two) times daily.    Allergies:   Codeine   Social History:  The patient  reports that he quit smoking about 11 years ago. His smoking use included cigarettes. He has a 10.00 pack-year smoking history. He has never used smokeless tobacco. He reports that he does not drink alcohol or use drugs.   Family History:  The patient's family history includes Cancer in his paternal grandfather; Colon cancer in his father; Heart disease in his father; Lung cancer in his maternal grandmother; Multiple sclerosis in his mother; Ovarian cancer in his paternal grandmother; Thyroid cancer in his maternal grandfather.  ROS:   Review of Systems  Constitutional: Negative for chills, diaphoresis, fever, malaise/fatigue and weight loss.  HENT: Negative for congestion.   Eyes: Negative for discharge and redness.  Respiratory: Negative for cough, hemoptysis, sputum production, shortness of breath and wheezing.   Cardiovascular: Negative for chest pain, palpitations, orthopnea, claudication, leg swelling and PND.  Gastrointestinal: Negative for abdominal pain, blood in stool, heartburn, melena, nausea and vomiting.  Genitourinary: Negative for hematuria.  Musculoskeletal: Negative for falls and myalgias.  Skin: Negative for rash.  Neurological: Negative for dizziness, tingling, tremors, sensory change, speech change, focal weakness, loss of consciousness and weakness.  Endo/Heme/Allergies: Does not bruise/bleed easily.  Psychiatric/Behavioral: Negative for substance abuse. The patient is not nervous/anxious.   All other systems reviewed and are negative.    PHYSICAL EXAM:  VS:  BP 136/80 (BP Location: Left Arm, Patient Position: Sitting, Cuff Size: Normal)   Pulse 94   Ht 5\' 2"  (1.575 m)   Wt 189 lb (85.7 kg)   BMI  34.57 kg/m  BMI: Body mass index is 34.57 kg/m.  Physical Exam  Constitutional: He is oriented to person, place, and time. He appears well-developed and well-nourished.  HENT:  Head: Normocephalic and atraumatic.  Eyes: Right eye exhibits no discharge. Left eye exhibits no discharge.  Neck: Normal range of motion. No JVD present.  Cardiovascular: Normal rate, regular rhythm, S1 normal, S2 normal and normal heart sounds. Exam reveals no distant heart sounds, no friction rub, no midsystolic click and no opening snap.  No murmur heard. Pulses:      Posterior tibial pulses are 2+ on the right side, and 2+ on the left side.  Pulmonary/Chest: Effort normal and breath sounds normal. No respiratory distress. He has no decreased breath sounds. He has no wheezes. He has no rales. He exhibits no tenderness.  Abdominal: Soft. He exhibits no distension. There is no tenderness.  Musculoskeletal: He exhibits no edema.  Neurological: He is alert and oriented to person, place, and time.  Skin: Skin is warm and dry. No cyanosis. Nails show no clubbing.  Psychiatric: He has a normal mood and affect. His speech  is normal and behavior is normal. Judgment and thought content normal.     EKG:  Was ordered and interpreted by me today. Shows NSR, 96 bpm, LVH, nonspecific anterolateral ST-T changes, baseline wandering  Recent Labs: 02/04/2018: ALT 52; B Natriuretic Peptide 216.0; TSH 2.826 02/13/2018: Magnesium 2.4 02/20/2018: BUN 20; Creatinine, Ser 1.36; Hemoglobin 16.6; Platelets 355; Potassium 4.3; Sodium 140  02/07/2018: Cholesterol 155; HDL 55; LDL Cholesterol 85; Total CHOL/HDL Ratio 2.8; Triglycerides 74; VLDL 15   Estimated Creatinine Clearance: 63.6 mL/min (A) (by C-G formula based on SCr of 1.36 mg/dL (H)).   Wt Readings from Last 3 Encounters:  03/01/18 189 lb (85.7 kg)  02/20/18 188 lb 6.4 oz (85.5 kg)  02/16/18 189 lb 4 oz (85.8 kg)     Other studies reviewed: Additional studies/records  reviewed today include: summarized above  ASSESSMENT AND PLAN:  1. Chronic HFpEF: He is doing well and does not appear grossly volume overloaded.  Continue torsemide 40 mg twice daily.  Check BMP.  CHF education provided.  2. History of elevated troponin: Felt to be in the setting of poorly controlled blood pressure and volume overload.  Not consistent with ACS.  Echocardiogram demonstrated preserved LV systolic function and normal wall motion.  Never with symptoms concerning for chest pain.  No plans for ischemic evaluation at this time given lack of anginal symptoms and echocardiogram demonstrating preserved LV systolic function and normal wall motion.  3. HTN: Blood pressure is mildly elevated today in the office of has been well controlled at home.  He will continue quinapril and torsemide.  4. HLD: LDL of 85 during recent admission.  Not currently on a statin.  Healthy lifestyle recommended.  5. AKI: Check BMP.  6. Restrictive airway disease: Refer back to pulmonology.  Printed prescription given to patient for inhaler spacer.  Disposition: F/u with Dr. Fletcher Anon or an APP in 3 months.  Current medicines are reviewed at length with the patient today.  The patient did not have any concerns regarding medicines.  Signed, Christell Faith, PA-C 03/01/2018 9:46 AM     [facility] [address] [place], [place] [address] [phone]

## 2018-03-01 ENCOUNTER — Ambulatory Visit (INDEPENDENT_AMBULATORY_CARE_PROVIDER_SITE_OTHER): Payer: Medicare Other | Admitting: Physician Assistant

## 2018-03-01 ENCOUNTER — Other Ambulatory Visit: Payer: Self-pay

## 2018-03-01 ENCOUNTER — Telehealth: Payer: Self-pay

## 2018-03-01 ENCOUNTER — Encounter: Payer: Self-pay | Admitting: Physician Assistant

## 2018-03-01 VITALS — BP 136/80 | HR 94 | Ht 62.0 in | Wt 189.0 lb

## 2018-03-01 DIAGNOSIS — I10 Essential (primary) hypertension: Secondary | ICD-10-CM

## 2018-03-01 DIAGNOSIS — J984 Other disorders of lung: Secondary | ICD-10-CM

## 2018-03-01 DIAGNOSIS — I5032 Chronic diastolic (congestive) heart failure: Secondary | ICD-10-CM | POA: Diagnosis not present

## 2018-03-01 DIAGNOSIS — Q761 Klippel-Feil syndrome: Secondary | ICD-10-CM | POA: Diagnosis not present

## 2018-03-01 DIAGNOSIS — E782 Mixed hyperlipidemia: Secondary | ICD-10-CM

## 2018-03-01 DIAGNOSIS — N179 Acute kidney failure, unspecified: Secondary | ICD-10-CM

## 2018-03-01 DIAGNOSIS — E785 Hyperlipidemia, unspecified: Secondary | ICD-10-CM | POA: Diagnosis not present

## 2018-03-01 DIAGNOSIS — R7989 Other specified abnormal findings of blood chemistry: Secondary | ICD-10-CM | POA: Diagnosis not present

## 2018-03-01 DIAGNOSIS — Z6836 Body mass index (BMI) 36.0-36.9, adult: Secondary | ICD-10-CM | POA: Diagnosis not present

## 2018-03-01 DIAGNOSIS — F5101 Primary insomnia: Secondary | ICD-10-CM | POA: Diagnosis not present

## 2018-03-01 DIAGNOSIS — G4734 Idiopathic sleep related nonobstructive alveolar hypoventilation: Secondary | ICD-10-CM

## 2018-03-01 DIAGNOSIS — I5031 Acute diastolic (congestive) heart failure: Secondary | ICD-10-CM | POA: Diagnosis not present

## 2018-03-01 DIAGNOSIS — M419 Scoliosis, unspecified: Principal | ICD-10-CM

## 2018-03-01 DIAGNOSIS — Q540 Hypospadias, balanic: Secondary | ICD-10-CM | POA: Diagnosis not present

## 2018-03-01 DIAGNOSIS — E221 Hyperprolactinemia: Secondary | ICD-10-CM | POA: Diagnosis not present

## 2018-03-01 DIAGNOSIS — E291 Testicular hypofunction: Secondary | ICD-10-CM | POA: Diagnosis not present

## 2018-03-01 DIAGNOSIS — F4322 Adjustment disorder with anxiety: Secondary | ICD-10-CM | POA: Diagnosis not present

## 2018-03-01 DIAGNOSIS — D352 Benign neoplasm of pituitary gland: Secondary | ICD-10-CM | POA: Diagnosis not present

## 2018-03-01 DIAGNOSIS — R778 Other specified abnormalities of plasma proteins: Secondary | ICD-10-CM

## 2018-03-01 DIAGNOSIS — J9611 Chronic respiratory failure with hypoxia: Secondary | ICD-10-CM

## 2018-03-01 MED ORDER — SPACER/AERO-HOLDING CHAMBERS DEVI: 1.0000 | Freq: Four times a day (QID) | 0 refills | Status: DC

## 2018-03-01 NOTE — Telephone Encounter (Signed)
Spoke to patient, let him know the results of his ONO. 602 events with lowest O2 at 50%. Dr. Patsey Berthold would like patient to do in lab study. Patient aware, orders entered. Nothing further at this time.

## 2018-03-01 NOTE — Patient Instructions (Addendum)
Medication Instructions:  - Your physician recommends that you continue on your current medications as directed. Please refer to the Current Medication list given to you today.  If you need a refill on your cardiac medications before your next appointment, please call your pharmacy.   Lab work: - Your physician recommends that you have lab work today: BMP Please go to the [employer] at [facility]- 1st desk on the right to check in- this can be done on a walk in basis.  [place] This is 1 entrance over to the left from where you entered [facility] today    If you have labs (blood work) drawn today and your tests are completely normal, you will receive your results only by: Marland Kitchen MyChart Message (if you have MyChart) OR . A paper copy in the mail If you have any lab test that is abnormal or we need to change your treatment, we will call you to review the results.  Testing/Procedures: - none ordered  Follow-Up: At [facility], you and your health needs are our priority.  As part of our continuing mission to provide you with exceptional heart care, we have created designated Provider Care Teams.  These Care Teams include your primary Cardiologist (physician) and Advanced Practice Providers (APPs -  Physician Assistants and Nurse Practitioners) who all work together to provide you with the care you need, when you need it. . 3 months with Dr. Fletcher Anon   . Next available with Dr. Patsey Berthold- pulmonolgist   Any Other Special Instructions Will Be Listed Below (If Applicable). - A printed prescription has been given to you to try to get your spacer

## 2018-03-01 NOTE — Addendum Note (Signed)
Addended byAlvis Lemmings C on: 03/01/2018 10:02 AM   Modules accepted: Orders

## 2018-03-03 ENCOUNTER — Other Ambulatory Visit: Payer: Self-pay | Admitting: Family Medicine

## 2018-03-06 NOTE — Telephone Encounter (Signed)
Patient calling to schedule sleep study and to check need for upcoming 12-5 .  Also wants to know how to set up home o2

## 2018-03-07 NOTE — Telephone Encounter (Signed)
Left detailed message that patient will be contacted by sleep center when study approved by insurance. Also order placed today for new start 02 therapy. Pt has upcoming f/u with Dr. Patsey Berthold.

## 2018-03-07 NOTE — Addendum Note (Signed)
Addended by: Stephanie Coup on: 03/07/2018 10:19 AM   Modules accepted: Orders

## 2018-03-09 ENCOUNTER — Ambulatory Visit: Payer: Medicare Other | Attending: Internal Medicine

## 2018-03-09 ENCOUNTER — Ambulatory Visit: Payer: Medicare Other | Admitting: Pulmonary Disease

## 2018-03-09 DIAGNOSIS — R0683 Snoring: Secondary | ICD-10-CM | POA: Diagnosis not present

## 2018-03-09 DIAGNOSIS — G4761 Periodic limb movement disorder: Secondary | ICD-10-CM | POA: Insufficient documentation

## 2018-03-09 DIAGNOSIS — I499 Cardiac arrhythmia, unspecified: Secondary | ICD-10-CM | POA: Insufficient documentation

## 2018-03-09 DIAGNOSIS — I10 Essential (primary) hypertension: Secondary | ICD-10-CM | POA: Diagnosis not present

## 2018-03-10 ENCOUNTER — Telehealth: Payer: Self-pay

## 2018-03-10 ENCOUNTER — Encounter: Payer: Self-pay | Admitting: Pulmonary Disease

## 2018-03-10 DIAGNOSIS — G4710 Hypersomnia, unspecified: Secondary | ICD-10-CM | POA: Diagnosis not present

## 2018-03-10 NOTE — Telephone Encounter (Signed)
LM on VM that we will call patient when we have the results of the sleep study and let them know of Dr. Patsey Berthold recommendations following the study. Will schedule follow-up accordingly.

## 2018-03-10 NOTE — Telephone Encounter (Signed)
Patient girlfriend (and patient) were calling asking since patient did sleep study last night when is he due to come in for a FU   Please advise

## 2018-03-13 ENCOUNTER — Telehealth: Payer: Self-pay | Admitting: Family Medicine

## 2018-03-13 NOTE — Telephone Encounter (Signed)
I left a message asking the pt to call me at [phone] to schedule AWV-I w/ [facility] McKenzie. VDM (DD) °

## 2018-03-17 ENCOUNTER — Telehealth: Payer: Self-pay

## 2018-03-17 NOTE — Telephone Encounter (Signed)
Attempted to call patient to schedule apt to go over sleep study results. Mailbox full.

## 2018-03-21 ENCOUNTER — Ambulatory Visit: Payer: Medicare Other | Attending: Family | Admitting: Family

## 2018-03-21 ENCOUNTER — Encounter: Payer: Self-pay | Admitting: Family

## 2018-03-21 VITALS — BP 133/73 | HR 93 | Resp 18 | Ht 62.0 in | Wt 195.0 lb

## 2018-03-21 DIAGNOSIS — Z885 Allergy status to narcotic agent status: Secondary | ICD-10-CM | POA: Insufficient documentation

## 2018-03-21 DIAGNOSIS — Z7982 Long term (current) use of aspirin: Secondary | ICD-10-CM | POA: Diagnosis not present

## 2018-03-21 DIAGNOSIS — Q761 Klippel-Feil syndrome: Secondary | ICD-10-CM | POA: Diagnosis not present

## 2018-03-21 DIAGNOSIS — Z79899 Other long term (current) drug therapy: Secondary | ICD-10-CM | POA: Insufficient documentation

## 2018-03-21 DIAGNOSIS — I110 Hypertensive heart disease with heart failure: Secondary | ICD-10-CM | POA: Diagnosis not present

## 2018-03-21 DIAGNOSIS — F419 Anxiety disorder, unspecified: Secondary | ICD-10-CM | POA: Insufficient documentation

## 2018-03-21 DIAGNOSIS — I5033 Acute on chronic diastolic (congestive) heart failure: Secondary | ICD-10-CM | POA: Insufficient documentation

## 2018-03-21 DIAGNOSIS — K219 Gastro-esophageal reflux disease without esophagitis: Secondary | ICD-10-CM | POA: Diagnosis not present

## 2018-03-21 DIAGNOSIS — F329 Major depressive disorder, single episode, unspecified: Secondary | ICD-10-CM | POA: Diagnosis not present

## 2018-03-21 DIAGNOSIS — J45909 Unspecified asthma, uncomplicated: Secondary | ICD-10-CM | POA: Insufficient documentation

## 2018-03-21 DIAGNOSIS — Z87891 Personal history of nicotine dependence: Secondary | ICD-10-CM | POA: Insufficient documentation

## 2018-03-21 DIAGNOSIS — I5031 Acute diastolic (congestive) heart failure: Secondary | ICD-10-CM

## 2018-03-21 DIAGNOSIS — H409 Unspecified glaucoma: Secondary | ICD-10-CM | POA: Diagnosis not present

## 2018-03-21 DIAGNOSIS — E785 Hyperlipidemia, unspecified: Secondary | ICD-10-CM | POA: Diagnosis not present

## 2018-03-21 DIAGNOSIS — I10 Essential (primary) hypertension: Secondary | ICD-10-CM

## 2018-03-21 MED ORDER — METOLAZONE 2.5 MG PO TABS: 2.5000 mg | ORAL_TABLET | ORAL | 0 refills | Status: DC

## 2018-03-21 MED ORDER — POTASSIUM CHLORIDE ER 10 MEQ PO TBCR: 10.0000 meq | EXTENDED_RELEASE_TABLET | Freq: Every day | ORAL | 3 refills | Status: DC

## 2018-03-21 NOTE — Progress Notes (Signed)
Patient ID: Scott [name], male    DOB: May 28, 1970, 47 y.o.   MRN: [id number]  HPI  Scott [name] is a 47 y/o male with a history of hyperlipidemia, HTN, anxiety, arthritis, depression, klippel-feil syndrome, former tobacco use and chronic heart failure.   Echo report from 02/05/18 reviewed and showed an EF of 55-60%  Admitted 02/04/18 due to acute on chronic HF. Initially needed IV lasix and then transitioned to oral diuretics. Also needed oxygen initially but was then tapered off. Saturations did drop at night so he qualified for nighttime oxygen. Sleep study supposed to be done to rule out sleep apnea. Elevated troponins thought to be due to demand ischemia. Cardiology consult obtained. Discharged after 9 days.   He presents today for a follow-up visit with a chief complaint of minimal shortness of breath upon moderate exertion. He describes this as chronic in nature having been present for several years although feels like it has worsened over the last couple of days. He has associated light-headedness, anxiety and gradual weight gain along with this. Currently denies any difficulty sleeping, abdominal distention, palpitations, pedal edema, chest pain or fatigue. Did have some lower extremity edema over the last couple of days but that has since resolved.   Past Medical History:  Diagnosis Date  . Acid reflux   . Anxiety   . Arthritis   . Asthma   . CHF (congestive heart failure) ([place])   . Depression   . Glaucoma   . Hyperlipidemia   . Hypertension   . Klippel-Feil syndrome    Past Surgical History:  Procedure Laterality Date  . COLOSTOMY    . DENVER SHUNT PLACEMENT     Family History  Problem Relation Age of Onset  . Multiple sclerosis Mother   . Colon cancer Father   . Heart disease Father   . Lung cancer Maternal Grandmother   . Thyroid cancer Maternal Grandfather   . Ovarian cancer Paternal Grandmother   . Cancer Paternal Grandfather    Social History   Tobacco Use  .  Smoking status: Former Smoker    Packs/day: 1.00    Years: 10.00    Pack years: 10.00    Types: Cigarettes    Last attempt to quit: 05/2006    Years since quitting: 11.8  . Smokeless tobacco: Never Used  . Tobacco comment: quit 05/2006  Substance Use Topics  . Alcohol use: No    Alcohol/week: 0.0 standard drinks   Allergies  Allergen Reactions  . Codeine Shortness Of Breath   Prior to Admission medications   Medication Sig Start Date End Date Taking? Authorizing Provider  albuterol (PROVENTIL HFA;VENTOLIN HFA) 108 (90 Base) MCG/ACT inhaler Inhale 2 puffs into the lungs every 4 (four) hours as needed for wheezing or shortness of breath. 01/11/18  Yes Chauvin, Herbie [place], PA  ANDROGEL PUMP 20.25 MG/ACT (1.62%) GEL Apply 1 application topically daily.  09/04/14  Yes [provider]  aspirin EC 81 MG EC tablet Take 1 tablet (81 mg total) by mouth daily. 02/13/18  Yes Wieting, Richard, MD  brinzolamide (AZOPT) 1 % ophthalmic suspension Place 1 drop into both eyes daily.    Yes [provider]  bromocriptine (PARLODEL) 2.5 MG tablet Take 7.5 mg by mouth daily.  09/10/14  Yes [provider]  diazepam (VALIUM) 2 MG tablet Take 1 tablet by mouth as needed.   Yes [provider]  latanoprost (XALATAN) 0.005 % ophthalmic solution Place 1 drop into both eyes at  bedtime.  07/28/07  Yes [provider]  PARoxetine (PAXIL) 40 MG tablet Take 1 tablet (40 mg total) by mouth daily. 09/29/17  Yes [name], [name], PA  potassium chloride (K-DUR) 10 MEQ tablet Take 1 tablet (10 mEq total) by mouth daily. 02/13/18  Yes Wieting, Richard, MD  quinapril (ACCUPRIL) 20 MG tablet TAKE 1 TABLET BY MOUTH EVERY DAY 03/03/18  Yes [name], [name], PA  Spacer/Aero-Holding Chambers DEVI 1 Device by Does not apply route 4 (four) times daily. 03/01/18  Yes Dunn, Areta Haber, PA-C  torsemide (DEMADEX) 20 MG tablet Take 2 tablets (40 mg total) by mouth 2 (two) times daily. 02/17/18 05/18/18  Yes Alisa Graff, FNP    Review of Systems  Constitutional: Negative for appetite change and fatigue.  HENT: Negative for congestion, postnasal drip and sore throat.   Eyes: Negative.   Respiratory: Positive for shortness of breath (with moderate exertion). Negative for chest tightness.   Cardiovascular: Negative for chest pain, palpitations and leg swelling.  Gastrointestinal: Negative for abdominal distention and abdominal pain.  Endocrine: Negative.   Genitourinary: Negative.   Musculoskeletal: Negative for back pain and neck pain.  Skin: Negative.   Allergic/Immunologic: Negative.   Neurological: Positive for light-headedness. Negative for dizziness.  Hematological: Negative for adenopathy. Does not bruise/bleed easily.  Psychiatric/Behavioral: Negative for dysphoric mood and sleep disturbance (sleeping on 1 pillow). The patient is nervous/anxious.    Vitals:   03/21/18 1343  BP: 133/73  Pulse: 93  Resp: 18  SpO2: 91%  Weight: 195 lb (88.5 kg)  Height: 5\' 2"  (1.575 m)   Wt Readings from Last 3 Encounters:  03/21/18 195 lb (88.5 kg)  03/01/18 189 lb (85.7 kg)  02/20/18 188 lb 6.4 oz (85.5 kg)   Lab Results  Component Value Date   CREATININE 1.36 (H) 02/20/2018   CREATININE 1.17 02/13/2018   CREATININE 1.22 02/12/2018   Physical Exam Vitals signs and nursing note reviewed.  Constitutional:      Appearance: He is well-developed.  HENT:     Head: Normocephalic and atraumatic.  Neck:     Musculoskeletal: Normal range of motion and neck supple.     Vascular: No JVD.  Cardiovascular:     Rate and Rhythm: Normal rate and regular rhythm.  Pulmonary:     Effort: Pulmonary effort is normal. No respiratory distress.     Breath sounds: Examination of the right-lower field reveals wheezing. Wheezing present. No rales.  Abdominal:     General: There is no distension.     Palpations: Abdomen is soft.     Tenderness: There is no abdominal tenderness.  Musculoskeletal:         General: No tenderness.     Right lower leg: He exhibits no tenderness. No edema.     Left lower leg: He exhibits no tenderness. No edema.  Skin:    General: Skin is warm and dry.  Neurological:     Mental Status: He is alert and oriented to person, place, and time.  Psychiatric:        Behavior: Behavior normal.        Thought Content: Thought content normal.    Assessment & Plan:  1: Acute on Chronic heart failure with preserved ejection fraction- - NYHA class II - euvolemic today - weighing daily; instructed to call for an overnight weight gain of >2 pounds or a weekly weight gain of >5 pounds - weight up 6 pounds since he was last here  1 month ago - will add metolazone 2.5mg  weekly along with additional 72meq potassium weekly - will check a BMP in 2 weeks - not adding salt and has been reading food labels. Reviewed the importance of closely following a 2000mg  sodium diet; low sodium cookbook was given to them today - saw cardiology (Dunn) 03/01/18 - BNP on 02/04/18 was 216.0 - patient does not get the flu vaccine; good handwashing encouraged  2: HTN- - BP looks good today - discussed changing his accupril to something else or titrating up accupril - saw PCP (Chauvin) 01/11/18 - BMP from 02/20/18 reviewed and showed sodium 140, potassium 4.3, creatinine 1.36 and GFR 62  3: Klippel-Fiel syndrome- - saw pulmonology Patsey Berthold) 01/25/18 - if shortness of breath continues, will need to follow-up with pulmonologist  - has PFT's scheduled 02/21/18  Medication list was reviewed.  Return in 2 weeks or sooner for any questions/problems before then.

## 2018-03-21 NOTE — Patient Instructions (Addendum)
Continue weighing daily and call for an overnight weight gain of > 2 pounds or a weekly weight gain of >5 pounds.  Take the metolazone (booster fluid pill) once weekly. Take it 1/2 hour prior to torsemide.   When you take the metolazone, you need to take an additional potassium tablet that day.

## 2018-03-22 ENCOUNTER — Telehealth: Payer: Self-pay

## 2018-03-22 NOTE — Telephone Encounter (Signed)
Called and left message for patient advising that Scott [name] has declined the prescription for the Metolazone as it is not on the formulary.   Patient is to increase his torsemide for the next 3 days taking a total of 6 tablets daily. He is also to increase his potassium to 20 meq while increasing his torsemide.   Patient was advised on voicemail to contact the office with any questions or concerns.

## 2018-03-23 ENCOUNTER — Ambulatory Visit: Payer: Medicare Other | Admitting: Pulmonary Disease

## 2018-03-24 ENCOUNTER — Encounter: Payer: Self-pay | Admitting: Pulmonary Disease

## 2018-04-03 ENCOUNTER — Ambulatory Visit: Payer: Medicare Other | Admitting: Family

## 2018-04-03 ENCOUNTER — Ambulatory Visit: Payer: Medicare Other

## 2018-04-03 ENCOUNTER — Telehealth: Payer: Self-pay | Admitting: Family

## 2018-04-03 NOTE — Progress Notes (Deleted)
Patient ID: ANCHOR DWAN, male    DOB: May 29, 1970, 47 y.o.   MRN: [id number]  HPI  Mr Schrager is a 47 y/o male with a history of hyperlipidemia, HTN, anxiety, arthritis, depression, klippel-feil syndrome, former tobacco use and chronic heart failure.   Echo report from 02/05/18 reviewed and showed an EF of 55-60%  Admitted 02/04/18 due to acute on chronic HF. Initially needed IV lasix and then transitioned to oral diuretics. Also needed oxygen initially but was then tapered off. Saturations did drop at night so he qualified for nighttime oxygen. Sleep study supposed to be done to rule out sleep apnea. Elevated troponins thought to be due to demand ischemia. Cardiology consult obtained. Discharged after 9 days.   He presents today for a follow-up visit with a chief complaint of   Attempted to give metolazone at his last visit but [employer] denied it so he took additional torsemide/potassium instead.   Past Medical History:  Diagnosis Date  . Acid reflux   . Anxiety   . Arthritis   . Asthma   . CHF (congestive heart failure) ([place])   . Depression   . Glaucoma   . Hyperlipidemia   . Hypertension   . Klippel-Feil syndrome    Past Surgical History:  Procedure Laterality Date  . COLOSTOMY    . DENVER SHUNT PLACEMENT     Family History  Problem Relation Age of Onset  . Multiple sclerosis Mother   . Colon cancer Father   . Heart disease Father   . Lung cancer Maternal Grandmother   . Thyroid cancer Maternal Grandfather   . Ovarian cancer Paternal Grandmother   . Cancer Paternal Grandfather    Social History   Tobacco Use  . Smoking status: Former Smoker    Packs/day: 1.00    Years: 10.00    Pack years: 10.00    Types: Cigarettes    Last attempt to quit: 05/2006    Years since quitting: 11.9  . Smokeless tobacco: Never Used  . Tobacco comment: quit 05/2006  Substance Use Topics  . Alcohol use: No    Alcohol/week: 0.0 standard drinks   Allergies  Allergen Reactions  . Codeine  Shortness Of Breath     Review of Systems  Constitutional: Negative for appetite change and fatigue.  HENT: Negative for congestion, postnasal drip and sore throat.   Eyes: Negative.   Respiratory: Positive for shortness of breath (with moderate exertion). Negative for chest tightness.   Cardiovascular: Negative for chest pain, palpitations and leg swelling.  Gastrointestinal: Negative for abdominal distention and abdominal pain.  Endocrine: Negative.   Genitourinary: Negative.   Musculoskeletal: Negative for back pain and neck pain.  Skin: Negative.   Allergic/Immunologic: Negative.   Neurological: Positive for light-headedness. Negative for dizziness.  Hematological: Negative for adenopathy. Does not bruise/bleed easily.  Psychiatric/Behavioral: Negative for dysphoric mood and sleep disturbance (sleeping on 1 pillow). The patient is nervous/anxious.     Physical Exam Vitals signs and nursing note reviewed.  Constitutional:      Appearance: He is well-developed.  HENT:     Head: Normocephalic and atraumatic.  Neck:     Musculoskeletal: Normal range of motion and neck supple.     Vascular: No JVD.  Cardiovascular:     Rate and Rhythm: Normal rate and regular rhythm.  Pulmonary:     Effort: Pulmonary effort is normal. No respiratory distress.     Breath sounds: Examination of the right-lower field reveals wheezing. Wheezing present.  No rales.  Abdominal:     General: There is no distension.     Palpations: Abdomen is soft.     Tenderness: There is no abdominal tenderness.  Musculoskeletal:        General: No tenderness.     Right lower leg: He exhibits no tenderness. No edema.     Left lower leg: He exhibits no tenderness. No edema.  Skin:    General: Skin is warm and dry.  Neurological:     Mental Status: He is alert and oriented to person, place, and time.  Psychiatric:        Behavior: Behavior normal.        Thought Content: Thought content normal.    Assessment  & Plan:  1: Acute on Chronic heart failure with preserved ejection fraction- - NYHA class II - euvolemic today - weighing daily; instructed to call for an overnight weight gain of >2 pounds or a weekly weight gain of >5 pounds -  - will check a BMP today - not adding salt and has been reading food labels. Reviewed the importance of closely following a 2000mg  sodium diet - saw cardiology (Dunn) 03/01/18 - BNP on 02/04/18 was 216.0 - patient does not get the flu vaccine; good handwashing encouraged  2: HTN- - BP  - discussed changing his accupril to something else or titrating up accupril - saw PCP (Chauvin) 01/11/18 - BMP from 02/20/18 reviewed and showed sodium 140, potassium 4.3, creatinine 1.36 and GFR 62  3: Klippel-Fiel syndrome- - saw pulmonology Patsey Berthold) 01/25/18 - if shortness of breath continues, will need to follow-up with pulmonologist  - has PFT's scheduled 02/21/18  Medication list was reviewed.

## 2018-04-03 NOTE — Telephone Encounter (Signed)
Patient did not show for his [facility] appointment on 04/03/18 Will attempt to reschedule.

## 2018-04-04 ENCOUNTER — Encounter: Payer: Self-pay | Admitting: Family

## 2018-04-04 ENCOUNTER — Ambulatory Visit: Payer: Medicare Other | Attending: Family | Admitting: Family

## 2018-04-04 ENCOUNTER — Other Ambulatory Visit
Admission: RE | Admit: 2018-04-04 | Discharge: 2018-04-04 | Disposition: A | Discharge status: Home / Self Care | Payer: Medicare Other | Source: Ambulatory Visit | Attending: Family | Admitting: Family

## 2018-04-04 VITALS — BP 112/73 | HR 96 | Resp 18 | Ht 62.0 in | Wt 195.1 lb

## 2018-04-04 DIAGNOSIS — Z79899 Other long term (current) drug therapy: Secondary | ICD-10-CM | POA: Diagnosis not present

## 2018-04-04 DIAGNOSIS — I10 Essential (primary) hypertension: Secondary | ICD-10-CM

## 2018-04-04 DIAGNOSIS — Z8249 Family history of ischemic heart disease and other diseases of the circulatory system: Secondary | ICD-10-CM | POA: Diagnosis not present

## 2018-04-04 DIAGNOSIS — I110 Hypertensive heart disease with heart failure: Secondary | ICD-10-CM | POA: Diagnosis not present

## 2018-04-04 DIAGNOSIS — Z87891 Personal history of nicotine dependence: Secondary | ICD-10-CM | POA: Insufficient documentation

## 2018-04-04 DIAGNOSIS — F419 Anxiety disorder, unspecified: Secondary | ICD-10-CM | POA: Insufficient documentation

## 2018-04-04 DIAGNOSIS — Z7982 Long term (current) use of aspirin: Secondary | ICD-10-CM | POA: Diagnosis not present

## 2018-04-04 DIAGNOSIS — I5032 Chronic diastolic (congestive) heart failure: Secondary | ICD-10-CM | POA: Insufficient documentation

## 2018-04-04 DIAGNOSIS — E785 Hyperlipidemia, unspecified: Secondary | ICD-10-CM | POA: Diagnosis not present

## 2018-04-04 DIAGNOSIS — Q761 Klippel-Feil syndrome: Secondary | ICD-10-CM | POA: Diagnosis not present

## 2018-04-04 DIAGNOSIS — R0602 Shortness of breath: Secondary | ICD-10-CM | POA: Diagnosis present

## 2018-04-04 LAB — BASIC METABOLIC PANEL
Anion gap: 10 (ref 5–15)
BUN: 29 mg/dL — ABNORMAL HIGH (ref 6–20)
CALCIUM: 9 mg/dL (ref 8.9–10.3)
CO2: 33 mmol/L — AB (ref 22–32)
CREATININE: 1.26 mg/dL — AB (ref 0.61–1.24)
Chloride: 97 mmol/L — ABNORMAL LOW (ref 98–111)
GFR calc non Af Amer: >60 mL/min (ref >60)
Glucose, Bld: 173 mg/dL — ABNORMAL HIGH (ref 70–99)
Potassium: 3.8 mmol/L (ref 3.5–5.1)
Sodium: 140 mmol/L (ref 135–145)

## 2018-04-04 NOTE — Progress Notes (Signed)
Patient ID: Scott [name], male    DOB: [birth year], 47 y.o.   MRN: [id number]  HPI  Mr Yellen is a 47 y/o male with a history of hyperlipidemia, HTN, anxiety, arthritis, depression, klippel-feil syndrome, former tobacco use and chronic heart failure.   Echo report from 02/05/18 reviewed and showed an EF of 55-60%  Admitted 02/04/18 due to acute on chronic HF. Initially needed IV lasix and then transitioned to oral diuretics. Also needed oxygen initially but was then tapered off. Saturations did drop at night so he qualified for nighttime oxygen. Sleep study supposed to be done to rule out sleep apnea. Elevated troponins thought to be due to demand ischemia. Cardiology consult obtained. Discharged after 9 days.   He presents today for a follow-up visit with a chief complaint of minimal shortness of breath upon moderate exertion. He says this has been present for several years. He does feel like his breathing has improved. He has associated fatigue, light-headedness and anxiety along with this. He denies any difficulty sleeping, abdominal distention, palpitations, pedal edema, chest pain, cough or weight gain. Has been taking 60mg  torsemide BID along with 22meq potassium daily since he was last here.   Past Medical History:  Diagnosis Date  . Acid reflux   . Anxiety   . Arthritis   . Asthma   . CHF (congestive heart failure) ([place])   . Depression   . Glaucoma   . Hyperlipidemia   . Hypertension   . Klippel-Feil syndrome    Past Surgical History:  Procedure Laterality Date  . COLOSTOMY    . DENVER SHUNT PLACEMENT     Family History  Problem Relation Age of Onset  . Multiple sclerosis Mother   . Colon cancer Father   . Heart disease Father   . Lung cancer Maternal Grandmother   . Thyroid cancer Maternal Grandfather   . Ovarian cancer Paternal Grandmother   . Cancer Paternal Grandfather    Social History   Tobacco Use  . Smoking status: Former Smoker    Packs/day: 1.00    Years:  10.00    Pack years: 10.00    Types: Cigarettes    Last attempt to quit: 05/2006    Years since quitting: 11.9  . Smokeless tobacco: Never Used  . Tobacco comment: quit 05/2006  Substance Use Topics  . Alcohol use: No    Alcohol/week: 0.0 standard drinks   Allergies  Allergen Reactions  . Codeine Shortness Of Breath   Prior to Admission medications   Medication Sig Start Date End Date Taking? Authorizing Provider  albuterol (PROVENTIL HFA;VENTOLIN HFA) 108 (90 Base) MCG/ACT inhaler Inhale 2 puffs into the lungs every 4 (four) hours as needed for wheezing or shortness of breath. 01/11/18  Yes Chauvin, Herbie [place], PA  ANDROGEL PUMP 20.25 MG/ACT (1.62%) GEL Apply 1 application topically daily.  09/04/14  Yes [provider]  aspirin EC 81 MG EC tablet Take 1 tablet (81 mg total) by mouth daily. 02/13/18  Yes Wieting, Richard, MD  brinzolamide (AZOPT) 1 % ophthalmic suspension Place 1 drop into both eyes daily.    Yes [provider]  bromocriptine (PARLODEL) 2.5 MG tablet Take 7.5 mg by mouth daily.  09/10/14  Yes [provider]  diazepam (VALIUM) 2 MG tablet Take 1 tablet by mouth as needed.   Yes [provider]  latanoprost (XALATAN) 0.005 % ophthalmic solution Place 1 drop into both eyes at bedtime.  07/28/07  Yes [provider]  metolazone (ZAROXOLYN) 2.5 MG tablet Take 1 tablet (2.5 mg total) by mouth once a week. And as needed. 03/21/18 06/19/18 Yes , Otila Kluver A, FNP  PARoxetine (PAXIL) 40 MG tablet Take 1 tablet (40 mg total) by mouth daily. 09/29/17  Yes [name], [name], PA  potassium chloride (K-DUR) 10 MEQ tablet Take 1 tablet (10 mEq total) by mouth daily. And additional tablet when taking metolazone 03/21/18  Yes Darylene Price A, FNP  quinapril (ACCUPRIL) 20 MG tablet TAKE 1 TABLET BY MOUTH EVERY DAY 03/03/18  Yes [name], [name], PA  Spacer/Aero-Holding Chambers DEVI 1 Device by Does not apply route 4 (four) times daily. 03/01/18  Yes  Dunn, Areta Haber, PA-C  torsemide (DEMADEX) 20 MG tablet Take 2 tablets (40 mg total) by mouth 2 (two) times daily. 02/17/18 05/18/18 Yes Alisa Graff, FNP    Review of Systems  Constitutional: Positive for fatigue. Negative for appetite change.  HENT: Negative for congestion, postnasal drip and sore throat.   Eyes: Negative.   Respiratory: Positive for shortness of breath (improving). Negative for cough and chest tightness.   Cardiovascular: Negative for chest pain, palpitations and leg swelling.  Gastrointestinal: Negative for abdominal distention and abdominal pain.  Endocrine: Negative.   Genitourinary: Negative.   Musculoskeletal: Positive for arthralgias (right collarbone (tripped over pants and fall yesterday)) and neck pain. Negative for back pain.  Skin: Negative.   Allergic/Immunologic: Negative.   Neurological: Positive for light-headedness. Negative for dizziness.  Hematological: Negative for adenopathy. Does not bruise/bleed easily.  Psychiatric/Behavioral: Negative for dysphoric mood and sleep disturbance (sleeping on 1 pillow). The patient is nervous/anxious.    Vitals:   04/04/18 1410  Pulse: 96  Resp: 18  SpO2: 95%  Weight: 195 lb 2 oz (88.5 kg)  Height: 5\' 2"  (1.575 m)   Wt Readings from Last 3 Encounters:  04/04/18 195 lb 2 oz (88.5 kg)  03/21/18 195 lb (88.5 kg)  03/01/18 189 lb (85.7 kg)   Lab Results  Component Value Date   CREATININE 1.26 (H) 04/04/2018   CREATININE 1.36 (H) 02/20/2018   CREATININE 1.17 02/13/2018    Physical Exam Vitals signs and nursing note reviewed.  Constitutional:      Appearance: He is well-developed.  HENT:     Head: Normocephalic and atraumatic.  Neck:     Musculoskeletal: Normal range of motion and neck supple.     Vascular: No JVD.  Cardiovascular:     Rate and Rhythm: Normal rate and regular rhythm.  Pulmonary:     Effort: Pulmonary effort is normal. No respiratory distress.     Breath sounds: No wheezing or rales.   Abdominal:     General: There is no distension.     Palpations: Abdomen is soft.     Tenderness: There is no abdominal tenderness.  Musculoskeletal:        General: No tenderness.     Right lower leg: He exhibits no tenderness. No edema.     Left lower leg: He exhibits no tenderness. No edema.  Skin:    General: Skin is warm and dry.  Neurological:     Mental Status: He is alert and oriented to person, place, and time.  Psychiatric:        Behavior: Behavior normal.        Thought Content: Thought content normal.    Assessment & Plan:  1: Chronic heart failure with preserved ejection fraction- - NYHA class II - euvolemic today - weighing daily;  instructed to call for an overnight weight gain of >2 pounds or a weekly weight gain of >5 pounds - weight unchanged from last visit here - will check a BMP today since he's been taking extra torsemide/potassium - not adding salt and has been reading food labels. Reviewed the importance of closely following a 2000mg  sodium diet - saw cardiology (Dunn) 03/01/18 - BNP on 02/04/18 was 216.0 - patient does not get the flu vaccine; good handwashing encouraged  2: HTN- - BP looks good today - saw PCP (Chauvin) 01/11/18 - BMP from 02/20/18 reviewed and showed sodium 140, potassium 4.3, creatinine 1.36 and GFR 62  3: Klippel-Fiel syndrome- - saw pulmonology Patsey Berthold) 01/25/18 - if shortness of breath continues, will need to follow-up with pulmonologist  - had PFT's 02/21/18  Medication list was reviewed.  Return in 2 months or sooner for any questions/problems before then.

## 2018-04-04 NOTE — Patient Instructions (Signed)
Continue weighing daily and call for an overnight weight gain of > 2 pounds or a weekly weight gain of >5 pounds. 

## 2018-04-06 ENCOUNTER — Encounter: Payer: Self-pay | Admitting: Family

## 2018-04-10 ENCOUNTER — Ambulatory Visit (INDEPENDENT_AMBULATORY_CARE_PROVIDER_SITE_OTHER): Payer: Medicare Other

## 2018-04-10 VITALS — BP 124/62 | HR 85 | Temp 97.4°F | Ht 62.0 in | Wt 201.4 lb

## 2018-04-10 DIAGNOSIS — Z0000 Encounter for general adult medical examination without abnormal findings: Secondary | ICD-10-CM | POA: Diagnosis not present

## 2018-04-10 NOTE — Patient Instructions (Signed)
[name] , Thank you for taking time to come for your Medicare Wellness Visit. I appreciate your ongoing commitment to your health goals. Please review the following plan we discussed and let me know if I can assist you in the future.   Screening recommendations/referrals: Colonoscopy: Not required until age 48   Recommended yearly ophthalmology/optometry visit for glaucoma screening and checkup Recommended yearly dental visit for hygiene and checkup  Vaccinations: Influenza vaccine: Pt declines today.  Pneumococcal vaccine: Not required until age 48 Tdap vaccine: Up to date, due 02/2028 Shingles vaccine: Not required until age 72    Advanced directives: Advance directive discussed with you today. Even though you declined this today please call our office should you change your mind and we can give you the proper paperwork for you to fill out.  Conditions/risks identified: Fall risk prevention; Obesity- recommend to continue current diet plan of cutting out salt and fatty meats and focus on eating all lean meats.   Next appointment: 04/13/18 with Simona Huh for a follow up.  Preventive Care 40-64 Years, Male Preventive care refers to lifestyle choices and visits with your health care provider that can promote health and wellness. What does preventive care include?  A yearly physical exam. This is also called an annual well check.  Dental exams once or twice a year.  Routine eye exams. Ask your health care provider how often you should have your eyes checked.  Personal lifestyle choices, including:  Daily care of your teeth and gums.  Regular physical activity.  Eating a healthy diet.  Avoiding tobacco and drug use.  Limiting alcohol use.  Practicing safe sex.  Taking low-dose aspirin every day starting at age 1. What happens during an annual well check? The services and screenings done by your health care provider during your annual well check will depend on your age,  overall health, lifestyle risk factors, and family history of disease. Counseling  Your health care provider may ask you questions about your:  Alcohol use.  Tobacco use.  Drug use.  Emotional well-being.  Home and relationship well-being.  Sexual activity.  Eating habits.  Work and work Statistician. Screening  You may have the following tests or measurements:  Height, weight, and BMI.  Blood pressure.  Lipid and cholesterol levels. These may be checked every 5 years, or more frequently if you are over 60 years old.  Skin check.  Lung cancer screening. You may have this screening every year starting at age 18 if you have a 30-pack-year history of smoking and currently smoke or have quit within the past 15 years.  Fecal occult blood test (FOBT) of the stool. You may have this test every year starting at age 105.  Flexible sigmoidoscopy or colonoscopy. You may have a sigmoidoscopy every 5 years or a colonoscopy every 10 years starting at age 78.  Prostate cancer screening. Recommendations will vary depending on your family history and other risks.  Hepatitis C blood test.  Hepatitis B blood test.  Sexually transmitted disease (STD) testing.  Diabetes screening. This is done by checking your blood sugar (glucose) after you have not eaten for a while (fasting). You may have this done every 1-3 years. Discuss your test results, treatment options, and if necessary, the need for more tests with your health care provider. Vaccines  Your health care provider may recommend certain vaccines, such as:  Influenza vaccine. This is recommended every year.  Tetanus, diphtheria, and acellular pertussis (Tdap, Td) vaccine. You may  need a Td booster every 10 years.  Zoster vaccine. You may need this after age 64.  Pneumococcal 13-valent conjugate (PCV13) vaccine. You may need this if you have certain conditions and have not been vaccinated.  Pneumococcal polysaccharide (PPSV23)  vaccine. You may need one or two doses if you smoke cigarettes or if you have certain conditions. Talk to your health care provider about which screenings and vaccines you need and how often you need them. This information is not intended to replace advice given to you by your health care provider. Make sure you discuss any questions you have with your health care provider. Document Released: 04/18/2015 Document Revised: 12/10/2015 Document Reviewed: 01/21/2015 Elsevier Interactive Patient Education  2017 [employer] Prevention in the Home Falls can cause injuries. They can happen to people of all ages. There are many things you can do to make your home safe and to help prevent falls. What can I do on the outside of my home?  Regularly fix the edges of walkways and driveways and fix any cracks.  Remove anything that might make you trip as you walk through a door, such as a raised step or threshold.  Trim any bushes or trees on the path to your home.  Use bright outdoor lighting.  Clear any walking paths of anything that might make someone trip, such as rocks or tools.  Regularly check to see if handrails are loose or broken. Make sure that both sides of any steps have handrails.  Any raised decks and porches should have guardrails on the edges.  Have any leaves, snow, or ice cleared regularly.  Use sand or salt on walking paths during winter.  Clean up any spills in your garage right away. This includes oil or grease spills. What can I do in the bathroom?  Use night lights.  Install grab bars by the toilet and in the tub and shower. Do not use towel bars as grab bars.  Use non-skid mats or decals in the tub or shower.  If you need to sit down in the shower, use a plastic, non-slip stool.  Keep the floor dry. Clean up any water that spills on the floor as soon as it happens.  Remove soap buildup in the tub or shower regularly.  Attach bath mats securely with  double-sided non-slip rug tape.  Do not have throw rugs and other things on the floor that can make you trip. What can I do in the bedroom?  Use night lights.  Make sure that you have a light by your bed that is easy to reach.  Do not use any sheets or blankets that are too big for your bed. They should not hang down onto the floor.  Have a firm chair that has side arms. You can use this for support while you get dressed.  Do not have throw rugs and other things on the floor that can make you trip. What can I do in the kitchen?  Clean up any spills right away.  Avoid walking on wet floors.  Keep items that you use a lot in easy-to-reach places.  If you need to reach something above you, use a strong step stool that has a grab bar.  Keep electrical cords out of the way.  Do not use floor polish or wax that makes floors slippery. If you must use wax, use non-skid floor wax.  Do not have throw rugs and other things on the floor that can  make you trip. What can I do with my stairs?  Do not leave any items on the stairs.  Make sure that there are handrails on both sides of the stairs and use them. Fix handrails that are broken or loose. Make sure that handrails are as long as the stairways.  Check any carpeting to make sure that it is firmly attached to the stairs. Fix any carpet that is loose or worn.  Avoid having throw rugs at the top or bottom of the stairs. If you do have throw rugs, attach them to the floor with carpet tape.  Make sure that you have a light switch at the top of the stairs and the bottom of the stairs. If you do not have them, ask someone to add them for you. What else can I do to help prevent falls?  Wear shoes that:  Do not have high heels.  Have rubber bottoms.  Are comfortable and fit you well.  Are closed at the toe. Do not wear sandals.  If you use a stepladder:  Make sure that it is fully opened. Do not climb a closed stepladder.  Make  sure that both sides of the stepladder are locked into place.  Ask someone to hold it for you, if possible.  Clearly mark and make sure that you can see:  Any grab bars or handrails.  First and last steps.  Where the edge of each step is.  Use tools that help you move around (mobility aids) if they are needed. These include:  Canes.  Walkers.  Scooters.  Crutches.  Turn on the lights when you go into a dark area. Replace any light bulbs as soon as they burn out.  Set up your furniture so you have a clear path. Avoid moving your furniture around.  If any of your floors are uneven, fix them.  If there are any pets around you, be aware of where they are.  Review your medicines with your doctor. Some medicines can make you feel dizzy. This can increase your chance of falling. Ask your doctor what other things that you can do to help prevent falls. This information is not intended to replace advice given to you by your health care provider. Make sure you discuss any questions you have with your health care provider. Document Released: 01/16/2009 Document Revised: 08/28/2015 Document Reviewed: 04/26/2014 Elsevier Interactive Patient Education  2017 [employer].

## 2018-04-10 NOTE — Progress Notes (Addendum)
Subjective:   Scott [name] is a 48 y.o. male who presents for an Initial Medicare Annual Wellness Visit.  Review of Systems  N/A  Cardiac Risk Factors include: dyslipidemia;hypertension;male gender;obesity (BMI >30kg/m2)    Objective:    Today's Vitals   04/10/18 1322 04/10/18 1336  BP: 124/62   Pulse: 85   Temp: (!) 97.4 F (36.3 C)   TempSrc: Oral   Weight: 201 lb 6.4 oz (91.4 kg)   Height: 5\' 2"  (1.575 m)   PainSc: 3  3   PainLoc: Ear    Body mass index is 36.84 kg/m.  Advanced Directives 04/10/2018 02/05/2018 02/04/2018 11/07/2014  Does Patient Have a Medical Advance Directive? No No No No  Would patient like information on creating a medical advance directive? - No - Patient declined - -    Current Medications (verified) Outpatient Encounter Medications as of 04/10/2018  Medication Sig  . albuterol (PROVENTIL HFA;VENTOLIN HFA) 108 (90 Base) MCG/ACT inhaler Inhale 2 puffs into the lungs every 4 (four) hours as needed for wheezing or shortness of breath.  . ANDROGEL PUMP 20.25 MG/ACT (1.62%) GEL Apply 1 application topically daily.   Marland Kitchen aspirin EC 81 MG EC tablet Take 1 tablet (81 mg total) by mouth daily.  . brinzolamide (AZOPT) 1 % ophthalmic suspension Place 1 drop into both eyes daily.   . bromocriptine (PARLODEL) 2.5 MG tablet Take 7.5 mg by mouth daily.   . diazepam (VALIUM) 2 MG tablet Take 1 tablet by mouth as needed.  . latanoprost (XALATAN) 0.005 % ophthalmic solution Place 1 drop into both eyes at bedtime.   . metolazone (ZAROXOLYN) 2.5 MG tablet Take 1 tablet (2.5 mg total) by mouth once a week. And as needed.  Marland Kitchen PARoxetine (PAXIL) 40 MG tablet Take 1 tablet (40 mg total) by mouth daily.  . potassium chloride (K-DUR) 10 MEQ tablet Take 1 tablet (10 mEq total) by mouth daily. And additional tablet when taking metolazone  . quinapril (ACCUPRIL) 20 MG tablet TAKE 1 TABLET BY MOUTH EVERY DAY  . Spacer/Aero-Holding Chambers DEVI 1 Device by Does not apply route 4  (four) times daily.  Marland Kitchen torsemide (DEMADEX) 20 MG tablet Take 2 tablets (40 mg total) by mouth 2 (two) times daily.   No facility-administered encounter medications on file as of 04/10/2018.     Allergies (verified) Codeine   History: Past Medical History:  Diagnosis Date  . Acid reflux   . Anxiety   . Arthritis   . Asthma   . CHF (congestive heart failure) ([place])   . Depression   . Glaucoma   . Hyperlipidemia   . Hypertension   . Klippel-Feil syndrome    Past Surgical History:  Procedure Laterality Date  . COLOSTOMY    . [place] SHUNT PLACEMENT     Family History  Problem Relation Age of Onset  . Multiple sclerosis Mother   . Colon cancer Father   . Heart disease Father   . Lung cancer Maternal Grandmother   . Thyroid cancer Maternal Grandfather   . Ovarian cancer Paternal Grandmother   . Cancer Paternal Grandfather    Social History   Socioeconomic History  . Marital status: Single    Spouse name: Not on file  . Number of children: 0  . Years of education: Not on file  . Highest education level: Some college, no degree  Occupational History  . Occupation: disability  Social Needs  . Financial resource strain: Not hard at all  .  Food insecurity:    Worry: Never true    Inability: Never true  . Transportation needs:    Medical: No    Non-medical: No  Tobacco Use  . Smoking status: Former Smoker    Packs/day: 1.00    Years: 10.00    Pack years: 10.00    Types: Cigarettes    Last attempt to quit: 05/2006    Years since quitting: 11.9  . Smokeless tobacco: Never Used  . Tobacco comment: quit 05/2006  Substance and Sexual Activity  . Alcohol use: No    Alcohol/week: 0.0 standard drinks  . Drug use: No  . Sexual activity: Not on file  Lifestyle  . Physical activity:    Days per week: 0 days    Minutes per session: 0 min  . Stress: Very much  Relationships  . Social connections:    Talks on phone: Patient refused    Gets together: Patient refused     Attends religious service: Patient refused    Active member of club or organization: Patient refused    Attends meetings of clubs or organizations: Patient refused    Relationship status: Patient refused  Other Topics Concern  . Not on file  Social History Narrative  . Not on file   Tobacco Counseling Counseling given: Not Answered Comment: quit 05/2006   Clinical Intake:  Pre-visit preparation completed: Yes  Pain : 0-10 Pain Score: 3  Pain Type: Acute pain Pain Location: Ear Pain Orientation: Right Pain Descriptors / Indicators: Aching Pain Onset: In the past 7 days Pain Frequency: Intermittent     Nutritional Status: BMI > 30  Obese Nutritional Risks: None Diabetes: No  How often do you need to have someone help you when you read instructions, pamphlets, or other written materials from your doctor or pharmacy?: 1 - Never  Interpreter Needed?: No  Information entered by :: [facility], LPN  Activities of Daily Living In your present state of health, do you have any difficulty performing the following activities: 04/10/2018 04/04/2018  Hearing? Y N  Comment Complete hearing loss in right ear.  -  Vision? N N  Difficulty concentrating or making decisions? Y N  Walking or climbing stairs? Y N  Comment Due to SOB and left knee pain.  -  Dressing or bathing? N N  Doing errands, shopping? N N  Preparing Food and eating ? N -  Using the Toilet? N -  In the past six months, have you accidently leaked urine? N -  Do you have problems with loss of bowel control? N -  Managing your Medications? N -  Managing your Finances? N -  Housekeeping or managing your Housekeeping? N -  Some recent data might be hidden     Immunizations and Health Maintenance Immunization History  Administered Date(s) Administered  . Tdap 02/20/2018   There are no preventive care reminders to display for this patient.  Patient Care Team: [name], [name], PA as PCP - General (Physician  Assistant) Alisa Graff, FNP (Family Medicine) Minna Merritts, MD as Consulting Physician (Cardiology) Idolina Primer, Areta Haber, PA-C as Physician Assistant (Physician Assistant) Tyler Pita, MD as Consulting Physician (Pulmonary Disease) Lenard Simmer, MD as Attending Physician (Endocrinology)  Indicate any recent Medical Services you may have received from other than [facility] providers in the past year (date may be approximate).    Assessment:   This is a routine wellness examination for [place].  Hearing/Vision screen No exam data present  Dietary issues and exercise activities discussed: Current Exercise Habits: The patient does not participate in regular exercise at present(Not currently since fall on 04/07/18. Previously was lifting weights and doing cardio daily. ), Exercise limited by: orthopedic condition(s)  Goals    . DIET - INCREASE LEAN PROTEINS     Recommend to continue current diet plan of cutting out salt and fatty meats and focus on eating all lean meats.       Depression Screen PHQ 2/9 Scores 04/10/2018 02/20/2018 02/20/2018 09/29/2017  PHQ - 2 Score 2 2 2 6   PHQ- 9 Score 6 2 - 23  Exception Documentation - Other- indicate reason in comment box - -  Not completed - (No Data) - -    Fall Risk Fall Risk  04/10/2018 04/04/2018 03/21/2018 02/20/2018 02/16/2018  Falls in the past year? 1 1 0 1 1  Number falls in past yr: 1 0 0 0 0  Injury with Fall? 0 1 0 0 0  Risk for fall due to : Impaired balance/gait;Other (Comment) - - - -  Risk for fall due to: Comment dizziness - - - -  Follow up Falls prevention discussed - - - -    FALL RISK PREVENTION PERTAINING TO THE HOME:  Any stairs in or around the home WITH handrails? No  Home free of loose throw rugs in walkways, pet beds, electrical cords, etc? Yes  Adequate lighting in your home to reduce risk of falls? Yes   ASSISTIVE DEVICES UTILIZED TO PREVENT FALLS:  Life alert? No  Use of a cane, walker or w/c? No  Grab  bars in the bathroom? No  Shower chair or bench in shower? No  Elevated toilet seat or a handicapped toilet? No    TIMED UP AND GO:  Was the test performed? No .     Cognitive Function:     6CIT Screen 04/10/2018  What Year? 0 points  What month? 0 points  What time? 0 points  Count back from 20 0 points  Months in reverse 0 points  Repeat phrase 0 points  Total Score 0    Screening Tests Health Maintenance  Topic Date Due  . INFLUENZA VACCINE  01/12/2019 (Originally 11/03/2017)  . TETANUS/TDAP  02/21/2028  . HIV Screening  Completed     Tdap: Up to date  Flu Vaccine: Due for Flu vaccine. Does the patient want to receive this vaccine today?  No . Education has been provided regarding the importance of this vaccine but still declined. Advised may receive this vaccine at local pharmacy or Health Dept. Aware to provide a copy of the vaccination record if obtained from local pharmacy or Health Dept. Verbalized acceptance and understanding.    Cancer Screenings:  Colorectal Screening: Not required until age 79.   Lung Cancer Screening: (Low Dose CT Chest recommended if Age 33-80 years, 30 pack-year currently smoking OR have quit w/in 15years.) does not qualify.    Additional Screening:  Vision Screening: Recommended annual ophthalmology exams for early detection of glaucoma and other disorders of the eye.  Dental Screening: Recommended annual dental exams for proper oral hygiene  Community Resource Referral:  CRR required this visit?  No        Plan:  I have personally reviewed and addressed the Medicare Annual Wellness questionnaire and have noted the following in the patient's chart:  A. Medical and social history B. Use of alcohol, tobacco or illicit drugs  C. Current medications and supplements D. Functional  ability and status E.  Nutritional status F.  Physical activity G. Advance directives H. List of other physicians I.  Hospitalizations, surgeries,  and ER visits in previous 12 months J.  [place] such as hearing and vision if needed, cognitive and depression L. Referrals and appointments - none  In addition, I have reviewed and discussed with patient certain preventive protocols, quality metrics, and best practice recommendations. A written personalized care plan for preventive services as well as general preventive health recommendations were provided to patient.  See attached scanned questionnaire for additional information.   Signed,  Fabio Neighbors, LPN Nurse Health Advisor   Nurse Recommendations: Pt declined the influenza vaccine today.   Reviewed screening by Nurse Health Advisor. Was available for consultation. Agree with documentation and recommendations.

## 2018-04-13 ENCOUNTER — Encounter: Payer: Self-pay | Admitting: Family Medicine

## 2018-04-13 ENCOUNTER — Ambulatory Visit (INDEPENDENT_AMBULATORY_CARE_PROVIDER_SITE_OTHER): Payer: Medicare Other | Admitting: Family Medicine

## 2018-04-13 VITALS — BP 114/60 | HR 81 | Temp 97.6°F | Resp 16 | Wt 198.4 lb

## 2018-04-13 DIAGNOSIS — M542 Cervicalgia: Secondary | ICD-10-CM

## 2018-04-13 DIAGNOSIS — F410 Panic disorder [episodic paroxysmal anxiety] without agoraphobia: Secondary | ICD-10-CM | POA: Diagnosis not present

## 2018-04-13 DIAGNOSIS — Z79899 Other long term (current) drug therapy: Secondary | ICD-10-CM | POA: Diagnosis not present

## 2018-04-13 DIAGNOSIS — Q039 Congenital hydrocephalus, unspecified: Secondary | ICD-10-CM

## 2018-04-13 DIAGNOSIS — I5032 Chronic diastolic (congestive) heart failure: Secondary | ICD-10-CM | POA: Diagnosis not present

## 2018-04-13 DIAGNOSIS — Q761 Klippel-Feil syndrome: Secondary | ICD-10-CM | POA: Diagnosis not present

## 2018-04-13 NOTE — Progress Notes (Signed)
Patient: Scott [name] Male    DOB: 1970-04-15   48 y.o.   MRN: [id number] Visit Date: 04/13/2018  Today's Provider: [name], PA   No chief complaint on file.  Subjective:     Otalgia   There is pain in the right ear. This is a new problem. The current episode started 1 to 4 weeks ago (Reports that his ear has been hurting since he fell a week ago.). The problem occurs constantly. The problem has been unchanged. There has been no fever. The pain is at a severity of 3/10. The pain is mild. Pertinent negatives include no abdominal pain, coughing, ear discharge, headaches, hearing loss, neck pain, rhinorrhea, sore throat or vomiting. He has tried acetaminophen for the symptoms. The treatment provided no relief.  Fall  The accident occurred more than 1 week ago (a week ago). The fall occurred while standing (Patient was changing into his pants in his bedroom when he fell. Reports he impacted the door frame.). He fell from a height of 3 to 5 ft. He landed on carpet. The point of impact was the right shoulder and face ("right side of face"). The pain is present in the neck, right shoulder, right upper arm, left upper arm and back (Reports that he has been having sharp pains in his upper arms. He reports it feels like when you "pulled a muscle"). The pain is at a severity of 8/10. The pain is moderate. The symptoms are aggravated by movement (with some pressure.). Pertinent negatives include no abdominal pain, fever, headaches, loss of consciousness, nausea, numbness, tingling, visual change or vomiting. He has tried acetaminophen for the symptoms. The treatment provided no relief.    Patient would like an Ortho referral.  Patient also needs a refill on his Valium.  Past Medical History:  Diagnosis Date  . Acid reflux   . Anxiety   . Arthritis   . Asthma   . CHF (congestive heart failure) ([place])   . Depression   . Glaucoma   . Hyperlipidemia   . Hypertension   . Klippel-Feil  syndrome    Past Surgical History:  Procedure Laterality Date  . COLOSTOMY    . [place] SHUNT PLACEMENT     Family History  Problem Relation Age of Onset  . Multiple sclerosis Mother   . Colon cancer Father   . Heart disease Father   . Lung cancer Maternal Grandmother   . Thyroid cancer Maternal Grandfather   . Ovarian cancer Paternal Grandmother   . Cancer Paternal Grandfather    Allergies  Allergen Reactions  . Codeine Shortness Of Breath    Current Outpatient Medications:  .  albuterol (PROVENTIL HFA;VENTOLIN HFA) 108 (90 Base) MCG/ACT inhaler, Inhale 2 puffs into the lungs every 4 (four) hours as needed for wheezing or shortness of breath., Disp: 1 Inhaler, Rfl: 2 .  ANDROGEL PUMP 20.25 MG/ACT (1.62%) GEL, Apply 1 application topically daily. , Disp: , Rfl:  .  brinzolamide (AZOPT) 1 % ophthalmic suspension, Place 1 drop into both eyes daily. , Disp: , Rfl:  .  bromocriptine (PARLODEL) 2.5 MG tablet, Take 7.5 mg by mouth daily. , Disp: , Rfl: 0 .  diazepam (VALIUM) 2 MG tablet, Take 1 tablet by mouth as needed., Disp: , Rfl:  .  latanoprost (XALATAN) 0.005 % ophthalmic solution, Place 1 drop into both eyes at bedtime. , Disp: , Rfl:  .  PARoxetine (PAXIL) 40 MG tablet, Take 1 tablet (  40 mg total) by mouth daily., Disp: 30 tablet, Rfl: 0 .  potassium chloride (K-DUR) 10 MEQ tablet, Take 1 tablet (10 mEq total) by mouth daily. And additional tablet when taking metolazone, Disp: 100 tablet, Rfl: 3 .  quinapril (ACCUPRIL) 20 MG tablet, TAKE 1 TABLET BY MOUTH EVERY DAY, Disp: 90 tablet, Rfl: 3 .  Spacer/Aero-Holding Chambers DEVI, 1 Device by Does not apply route 4 (four) times daily., Disp: 1 each, Rfl: 0 .  torsemide (DEMADEX) 20 MG tablet, Take 2 tablets (40 mg total) by mouth 2 (two) times daily., Disp: 120 tablet, Rfl: 3 .  aspirin EC 81 MG EC tablet, Take 1 tablet (81 mg total) by mouth daily. (Patient not taking: Reported on 04/13/2018), Disp: 30 tablet, Rfl: 0  Review of  Systems  Constitutional: Negative for appetite change, chills and fever.  HENT: Positive for ear pain. Negative for ear discharge, hearing loss, rhinorrhea and sore throat.   Respiratory: Negative for cough, chest tightness, shortness of breath and wheezing.   Cardiovascular: Negative for chest pain and palpitations.  Gastrointestinal: Negative for abdominal pain, nausea and vomiting.  Musculoskeletal: Negative for neck pain.  Neurological: Negative for tingling, loss of consciousness, numbness and headaches.   Social History   Tobacco Use  . Smoking status: Former Smoker    Packs/day: 1.00    Years: 10.00    Pack years: 10.00    Types: Cigarettes    Last attempt to quit: 05/2006    Years since quitting: 11.9  . Smokeless tobacco: Never Used  . Tobacco comment: quit 05/2006  Substance Use Topics  . Alcohol use: No    Alcohol/week: 0.0 standard drinks     Objective:   BP 114/60 (BP Location: Right Arm, Patient Position: Sitting, Cuff Size: Large)   Pulse 81   Temp 97.6 F (36.4 C) (Oral)   Resp 16   Wt 198 lb 6.4 oz (90 kg)   SpO2 99%   BMI 36.29 kg/m    Wt Readings from Last 3 Encounters:  04/13/18 198 lb 6.4 oz (90 kg)  04/10/18 201 lb 6.4 oz (91.4 kg)  04/04/18 195 lb 2 oz (88.5 kg)   Vitals:   04/13/18 1442  BP: 114/60  Pulse: 81  Resp: 16  Temp: 97.6 F (36.4 C)  TempSrc: Oral  SpO2: 99%  Weight: 198 lb 6.4 oz (90 kg)   Physical Exam Constitutional:      General: He is not in acute distress.    Appearance: He is well-developed.  HENT:     Head: Normocephalic and atraumatic.     Comments: No Battle Sign.    Right Ear: Hearing and tympanic membrane normal.     Left Ear: Hearing and tympanic membrane normal.     Nose: Nose normal.  Eyes:     General: Lids are normal. No scleral icterus.       Right eye: No discharge.        Left eye: No discharge.     Conjunctiva/sclera: Conjunctivae normal.  Neck:     Comments: Decreased ROM secondary to  congenital anomalies. Cardiovascular:     Rate and Rhythm: Normal rate.     Pulses: Normal pulses.     Heart sounds: Normal heart sounds.  Pulmonary:     Effort: Pulmonary effort is normal. No respiratory distress.  Musculoskeletal:     Comments: Scoliosis and distal extremity deformities secondary to Klipppel-Feil Syndrome from birth. Multiple congenital anomalies of skeleton. Soreness in the  right shoulder and neck along the shunt pathway. Soreness in the right TMJ. Good ROM of the right arm with strength 5/5 and DTR's 2+ symmetrically in upper extremities.  Skin:    Findings: No lesion or rash.  Neurological:     Mental Status: He is alert and oriented to person, place, and time.  Psychiatric:        Speech: Speech normal.        Behavior: Behavior normal.        Thought Content: Thought content normal.       Assessment & Plan    1. Neck pain on right side Tenderness along the right side of neck when palpating shunt in the right side of his neck. No neurologic deficit. Will get CT scan of neck to assess shunt for damage and displacement in neck. Neck is stiff and no swelling or ecchymosis. - CT CERVICAL SPINE WO CONTRAST  2. Congenital hydrocephalus ([place]) History of [place] shunt in place along the right side of his neck for hydrocephalus since he was a child. Denies headaches or vision disturbances. Worried about shunt damage with his recent fall against a door jam. No bruising noted.   3. Klippel-Feil syndrome Stable congenital skeletal anomalies.  4. Chronic diastolic heart failure (HCC) Feeling better on the Torsemide 20 mg 2 tablets BID. No muscle weakness or dyspnea of significance today. No peripheral edema and lungs clear to auscultation. Keep follow up at the [facility] with Lysle Morales as planned in a couple weeks. Reminded him to contact that clinic if he gains >2 lbs in 24 hours or >5 lbs in a week.      [name], PA  [facility]

## 2018-04-19 ENCOUNTER — Ambulatory Visit
Admission: RE | Admit: 2018-04-19 | Discharge: 2018-04-19 | Disposition: A | Discharge status: Home / Self Care | Payer: Medicare Other | Source: Ambulatory Visit | Attending: Family | Admitting: Family

## 2018-04-19 ENCOUNTER — Telehealth: Payer: Self-pay | Admitting: Family

## 2018-04-19 ENCOUNTER — Other Ambulatory Visit: Payer: Self-pay | Admitting: Family

## 2018-04-19 ENCOUNTER — Ambulatory Visit: Payer: Medicare Other | Admitting: Family

## 2018-04-19 ENCOUNTER — Ambulatory Visit: Admission: RE | Admit: 2018-04-19 | Payer: Medicare Other | Source: Ambulatory Visit

## 2018-04-19 ENCOUNTER — Encounter: Payer: Self-pay | Admitting: Family

## 2018-04-19 VITALS — BP 155/83 | HR 85 | Resp 18 | Ht 62.0 in | Wt 202.0 lb

## 2018-04-19 DIAGNOSIS — J45909 Unspecified asthma, uncomplicated: Secondary | ICD-10-CM

## 2018-04-19 DIAGNOSIS — F329 Major depressive disorder, single episode, unspecified: Secondary | ICD-10-CM

## 2018-04-19 DIAGNOSIS — Z885 Allergy status to narcotic agent status: Secondary | ICD-10-CM

## 2018-04-19 DIAGNOSIS — F419 Anxiety disorder, unspecified: Secondary | ICD-10-CM

## 2018-04-19 DIAGNOSIS — H409 Unspecified glaucoma: Secondary | ICD-10-CM | POA: Insufficient documentation

## 2018-04-19 DIAGNOSIS — Z9981 Dependence on supplemental oxygen: Secondary | ICD-10-CM | POA: Insufficient documentation

## 2018-04-19 DIAGNOSIS — Z79899 Other long term (current) drug therapy: Secondary | ICD-10-CM | POA: Insufficient documentation

## 2018-04-19 DIAGNOSIS — I110 Hypertensive heart disease with heart failure: Secondary | ICD-10-CM | POA: Insufficient documentation

## 2018-04-19 DIAGNOSIS — Z8249 Family history of ischemic heart disease and other diseases of the circulatory system: Secondary | ICD-10-CM | POA: Insufficient documentation

## 2018-04-19 DIAGNOSIS — Z87891 Personal history of nicotine dependence: Secondary | ICD-10-CM | POA: Insufficient documentation

## 2018-04-19 DIAGNOSIS — I5031 Acute diastolic (congestive) heart failure: Secondary | ICD-10-CM

## 2018-04-19 DIAGNOSIS — Q761 Klippel-Feil syndrome: Secondary | ICD-10-CM | POA: Insufficient documentation

## 2018-04-19 DIAGNOSIS — M1990 Unspecified osteoarthritis, unspecified site: Secondary | ICD-10-CM

## 2018-04-19 DIAGNOSIS — I5033 Acute on chronic diastolic (congestive) heart failure: Secondary | ICD-10-CM

## 2018-04-19 DIAGNOSIS — I5032 Chronic diastolic (congestive) heart failure: Secondary | ICD-10-CM | POA: Diagnosis present

## 2018-04-19 DIAGNOSIS — I10 Essential (primary) hypertension: Secondary | ICD-10-CM

## 2018-04-19 LAB — BASIC METABOLIC PANEL
Anion gap: 11 (ref 5–15)
BUN: 20 mg/dL (ref 6–20)
CHLORIDE: 97 mmol/L — AB (ref 98–111)
CO2: 31 mmol/L (ref 22–32)
CREATININE: 1.12 mg/dL (ref 0.61–1.24)
Calcium: 8.7 mg/dL — ABNORMAL LOW (ref 8.9–10.3)
GFR calc non Af Amer: >60 mL/min (ref >60)
Glucose, Bld: 130 mg/dL — ABNORMAL HIGH (ref 70–99)
Potassium: 3.3 mmol/L — ABNORMAL LOW (ref 3.5–5.1)
SODIUM: 139 mmol/L (ref 135–145)

## 2018-04-19 LAB — BRAIN NATRIURETIC PEPTIDE: B NATRIURETIC PEPTIDE 5: 15 pg/mL (ref 0.0–100.0)

## 2018-04-19 MED ORDER — POTASSIUM CHLORIDE CRYS ER 20 MEQ PO TBCR: 40.0000 meq | EXTENDED_RELEASE_TABLET | Freq: Once | ORAL | Status: DC

## 2018-04-19 MED ORDER — FUROSEMIDE 10 MG/ML IJ SOLN
80.0000 mg | Freq: Once | INTRAMUSCULAR | Status: AC
  Administered 2018-04-19: 80 mg via INTRAVENOUS

## 2018-04-19 MED ORDER — FUROSEMIDE 10 MG/ML IJ SOLN
INTRAMUSCULAR | Status: AC
  Filled 2018-04-19: qty 8

## 2018-04-19 MED ORDER — POTASSIUM CHLORIDE CRYS ER 20 MEQ PO TBCR: 20.0000 meq | EXTENDED_RELEASE_TABLET | Freq: Every day | ORAL | 0 refills | Status: DC

## 2018-04-19 NOTE — Progress Notes (Signed)
Patient ID: ELRIDGE STEMM, male    DOB: Oct 13, 1970, 48 y.o.   MRN: [id number]  HPI  Mr Gappa is a 48 y/o male with a history of hyperlipidemia, HTN, anxiety, arthritis, depression, klippel-feil syndrome, former tobacco use and chronic heart failure.   Echo report from 02/05/18 reviewed and showed an EF of 55-60%  Admitted 02/04/18 due to acute on chronic HF. Initially needed IV lasix and then transitioned to oral diuretics. Also needed oxygen initially but was then tapered off. Saturations did drop at night so he qualified for nighttime oxygen. Sleep study supposed to be done to rule out sleep apnea. Elevated troponins thought to be due to demand ischemia. Cardiology consult obtained. Discharged after 9 days.   He presents today for an acute visit with a chief complaint of moderate shortness of breath upon minimal exertion. He says that this has been present for several years although this has worsened over the last few days. He has associated fatigue, wheezing, light-headedness, anxiety, abdominal distention and weight gain along with this. He denies any difficulty sleeping, palpitations, pedal edema, chest pain or cough.   Past Medical History:  Diagnosis Date  . Acid reflux   . Anxiety   . Arthritis   . Asthma   . CHF (congestive heart failure) ([place])   . Depression   . Glaucoma   . Hyperlipidemia   . Hypertension   . Klippel-Feil syndrome    Past Surgical History:  Procedure Laterality Date  . COLOSTOMY    . DENVER SHUNT PLACEMENT     Family History  Problem Relation Age of Onset  . Multiple sclerosis Mother   . Colon cancer Father   . Heart disease Father   . Lung cancer Maternal Grandmother   . Thyroid cancer Maternal Grandfather   . Ovarian cancer Paternal Grandmother   . Cancer Paternal Grandfather    Social History   Tobacco Use  . Smoking status: Former Smoker    Packs/day: 1.00    Years: 10.00    Pack years: 10.00    Types: Cigarettes    Last attempt to quit:  05/2006    Years since quitting: 11.9  . Smokeless tobacco: Never Used  . Tobacco comment: quit 05/2006  Substance Use Topics  . Alcohol use: No    Alcohol/week: 0.0 standard drinks   Allergies  Allergen Reactions  . Codeine Shortness Of Breath   Prior to Admission medications   Medication Sig Start Date End Date Taking? Authorizing Provider  albuterol (PROVENTIL HFA;VENTOLIN HFA) 108 (90 Base) MCG/ACT inhaler Inhale 2 puffs into the lungs every 4 (four) hours as needed for wheezing or shortness of breath. 01/11/18  Yes Chauvin, Herbie [place], PA  ANDROGEL PUMP 20.25 MG/ACT (1.62%) GEL Apply 1 application topically daily.  09/04/14  Yes [provider]  brinzolamide (AZOPT) 1 % ophthalmic suspension Place 1 drop into both eyes daily.    Yes [provider]  bromocriptine (PARLODEL) 2.5 MG tablet Take 7.5 mg by mouth daily.  09/10/14  Yes [provider]  diazepam (VALIUM) 2 MG tablet Take 1 tablet by mouth as needed.   Yes [provider]  latanoprost (XALATAN) 0.005 % ophthalmic solution Place 1 drop into both eyes at bedtime.  07/28/07  Yes [provider]  PARoxetine (PAXIL) 40 MG tablet Take 1 tablet (40 mg total) by mouth daily. 09/29/17  Yes [name], [name], PA  quinapril (ACCUPRIL) 20 MG tablet TAKE 1 TABLET BY MOUTH EVERY DAY 03/03/18  Yes [name], [name], PA  Spacer/Aero-Holding Chambers DEVI 1 Device by Does not apply route 4 (four) times daily. 03/01/18  Yes Dunn, Areta Haber, PA-C  torsemide (DEMADEX) 20 MG tablet Take 2 tablets (40 mg total) by mouth 2 (two) times daily. Patient taking differently: Take 40 mg by mouth 3 (three) times daily.  02/17/18 05/18/18 Yes Darylene Price A, FNP  aspirin EC 81 MG EC tablet Take 1 tablet (81 mg total) by mouth daily. Patient not taking: Reported on 04/19/2018 02/13/18   Loletha Grayer, MD    Review of Systems  Constitutional: Positive for fatigue. Negative for appetite change.  HENT: Negative for  congestion, postnasal drip and sore throat.   Eyes: Negative.   Respiratory: Positive for shortness of breath (worsening) and wheezing. Negative for cough and chest tightness.   Cardiovascular: Negative for chest pain, palpitations and leg swelling.  Gastrointestinal: Positive for abdominal distention. Negative for abdominal pain.  Endocrine: Negative.   Genitourinary: Negative.   Musculoskeletal: Positive for arthralgias (right collarbone (tripped over pants and fall yesterday)) and neck pain. Negative for back pain.  Skin: Negative.   Allergic/Immunologic: Negative.   Neurological: Positive for light-headedness. Negative for dizziness.  Hematological: Negative for adenopathy. Does not bruise/bleed easily.  Psychiatric/Behavioral: Negative for dysphoric mood and sleep disturbance (sleeping on 1 pillow). The patient is nervous/anxious.    Vitals:   04/19/18 1558  BP: (!) 155/83  Pulse: 85  Resp: 18  SpO2: 96%  Weight: 202 lb (91.6 kg)  Height: 5\' 2"  (1.575 m)   Wt Readings from Last 3 Encounters:  04/19/18 202 lb (91.6 kg)  04/13/18 198 lb 6.4 oz (90 kg)  04/10/18 201 lb 6.4 oz (91.4 kg)   Lab Results  Component Value Date   CREATININE 1.26 (H) 04/04/2018   CREATININE 1.36 (H) 02/20/2018   CREATININE 1.17 02/13/2018    Physical Exam Vitals signs and nursing note reviewed.  Constitutional:      Appearance: He is well-developed.  HENT:     Head: Normocephalic and atraumatic.  Neck:     Musculoskeletal: Normal range of motion and neck supple.     Vascular: No JVD.  Cardiovascular:     Rate and Rhythm: Normal rate and regular rhythm.  Pulmonary:     Effort: Pulmonary effort is normal. No respiratory distress.     Breath sounds: Wheezing (throughout all lung fields) present. No rales.  Abdominal:     General: There is distension.     Tenderness: There is no abdominal tenderness.     Comments: tight  Musculoskeletal:        General: No tenderness.     Right lower leg:  He exhibits no tenderness. No edema.     Left lower leg: He exhibits no tenderness. No edema.  Skin:    General: Skin is warm and dry.  Neurological:     Mental Status: He is alert and oriented to person, place, and time.  Psychiatric:        Behavior: Behavior normal.        Thought Content: Thought content normal.    Assessment & Plan:  1: Acute on Chronic heart failure with preserved ejection fraction- - NYHA class III - moderately fluid overloaded today with weight gain, worsening abdominal distention and worsening shortness of breath - weighing daily; instructed to call for an overnight weight gain of >2 pounds or a weekly weight gain of >5 pounds - weight up 7 pounds from last visit here  on 04/04/18 - [employer] had previously denied metolazone even with PA - will give 80mg  IV lasix/ 18meq PO potassium today - BMP/ BNP to be drawn today - not adding salt and has been reading food labels. Reviewed the importance of closely following a 2000mg  sodium diet - saw cardiology (Dunn) 03/01/18 & f/u was scheduled for 04/24/2018 - BNP on 02/04/18 was 216.0 - patient does not get the flu vaccine; good handwashing encouraged  2: HTN- - BP mildly elevated - saw PCP (Chauvin) 01/11/18 - BMP from 02/20/18 reviewed and showed sodium 140, potassium 4.3, creatinine 1.36 and GFR 62  3: Klippel-Fiel syndrome- - saw pulmonology Patsey Berthold) 01/25/18 - emphasized that he needed to call his pulmonologist for f/u appointment - had PFT's 02/21/18 - wears oxygen at 2L PRN  Medication list was reviewed.  Seeing cardiology in 5 days so will keep already scheduled appointment in 2 weeks.

## 2018-04-19 NOTE — Telephone Encounter (Signed)
Received lab results from today. Renal function looks good although potassium is slightly low at 3.3.   Patient normally doesn't take potassium tablets and says that he eats a "very potassium rich" diet. LM on patient's voicemail that he either needed to eat even more potassium rich foods or, preferably, take oral potassium for a few days.   Will send prescription potassium 64meq into his pharmacy for him to take 1 tablet daily for 5 days.

## 2018-04-19 NOTE — OR Nursing (Signed)
Lab tech in for draw @ [phone]

## 2018-04-19 NOTE — OR Nursing (Signed)
Pt takes potassium at home and will do so when he gets back. Not given here since takes at home and is medicaid.

## 2018-04-23 NOTE — Progress Notes (Deleted)
Cardiology Office Note Date:  04/23/2018  Patient ID:  Scott [name], Scott [name] 21-Mar-1971, MRN [id number] PCP:  Margo Common, PA  Cardiologist:  Dr. Fletcher Anon, MD  ***refresh   Chief Complaint: Follow up   History of Present Illness: Scott [name] is a 48 y.o. male with history of congenital abnormality (Klippel-File syndrome) with restrictive lung disease and airway hyperactivity, diastolic CHF, hypertension, hyperlipidemia, asthma, alopecia, migraine disorder, open-angle glaucoma, depression, arthritis, anxiety, and GERD who presents for follow up of ***.  Patient was admitted to the hospital on 02/05/18 with a several week history of progressive shortness of breath, edema, orthopnea, and PND.  Prior to this admission he had no previously known cardiac history.  Chest x-ray showed cardiomegaly with mild pulmonary vascular congestion.  BNP was noted to be 216.  Troponin peaked at 0.06 and was felt to be demand ischemia in the setting of poorly controlled hypertension and volume overload.  Symptoms improved with IV diuresis.  Echo showed an EF of 55 to 60%, normal wall motion, grade 1 diastolic dysfunction, normal sized left atrium, RV systolic function normal, unable to accurately estimate PASP.  Documented discharge weight 85 mg.  Outpatient  PFTs on 02/21/2018 showed severe restrictive lung disease with positive bronchodilator response which was felt to possibly reflect underlying reactive airway disease.    He was last seen by me on 03/01/2018 and was doing well. His weight remained stable and had an office weight of 85.7 kg. He has since been followed by the CHF clinic, most recently being seen on 04/19/2018 with a weight of 91.6 kg. He was given IV Lasix 80 mg with KCl 40 mEq in the office. Labs checked that day showed a SCr of 1.12, K+ 3.3, BNP 15.  ***  Past Medical History:  Diagnosis Date  . Acid reflux   . Anxiety   . Arthritis   . Asthma   . CHF (congestive heart failure) ([place])   .  Depression   . Glaucoma   . Hyperlipidemia   . Hypertension   . Klippel-Feil syndrome     Past Surgical History:  Procedure Laterality Date  . COLOSTOMY    . DENVER SHUNT PLACEMENT      No outpatient medications have been marked as taking for the 04/24/18 encounter (Appointment) with Rise Mu, PA-C.    Allergies:   Codeine   Social History:  The patient  reports that he quit smoking about 11 years ago. His smoking use included cigarettes. He has a 10.00 pack-year smoking history. He has never used smokeless tobacco. He reports that he does not drink alcohol or use drugs.   Family History:  The patient's family history includes Cancer in his paternal grandfather; Colon cancer in his father; Heart disease in his father; Lung cancer in his maternal grandmother; Multiple sclerosis in his mother; Ovarian cancer in his paternal grandmother; Thyroid cancer in his maternal grandfather.  ROS:   ROS   PHYSICAL EXAM: *** VS:  There were no vitals taken for this visit. BMI: There is no height or weight on file to calculate BMI.  Physical Exam    EKG:  Was ordered and interpreted by me today. Shows ***  Recent Labs: 02/04/2018: ALT 52; TSH 2.826 02/13/2018: Magnesium 2.4 02/20/2018: Hemoglobin 16.6; Platelets 355 04/19/2018: B Natriuretic Peptide 15.0; BUN 20; Creatinine, Ser 1.12; Potassium 3.3; Sodium 139  02/07/2018: Cholesterol 155; HDL 55; LDL Cholesterol 85; Total CHOL/HDL Ratio 2.8; Triglycerides 74; VLDL 15  Estimated Creatinine Clearance: 80 mL/min (by C-G formula based on SCr of 1.12 mg/dL).   Wt Readings from Last 3 Encounters:  04/19/18 202 lb (91.6 kg)  04/13/18 198 lb 6.4 oz (90 kg)  04/10/18 201 lb 6.4 oz (91.4 kg)     Other studies reviewed: Additional studies/records reviewed today include: summarized above  ASSESSMENT AND PLAN:  1. ***  Disposition: F/u with Dr. Fletcher Anon or an APP in ***  Current medicines are reviewed at length with the patient today.  The  patient did not have any concerns regarding medicines.  Signed, Christell Faith, PA-C 04/23/2018 2:34 PM     [place] [address] [place], [place] [address] [phone]

## 2018-04-24 ENCOUNTER — Ambulatory Visit: Payer: Medicare Other | Admitting: Physician Assistant

## 2018-04-26 NOTE — Progress Notes (Signed)
Cardiology Office Note Date:  04/27/2018  Patient ID:  Carr, Shartzer [birth year], MRN [id number] PCP:  Margo Common, PA  Cardiologist:  Dr. Fletcher Anon, MD    Chief Complaint: Follow up  History of Present Illness: Scott [name] is a 48 y.o. male with history of congenital abnormality (Klippel-File syndrome) with restrictive lung disease and airway hyperactivity, diastolic CHF, hypertension, hyperlipidemia, asthma, alopecia, migraine disorder, open-angle glaucoma, depression, arthritis, anxiety, and GERD who presents for follow up of diastolic CHF.  Patient was admitted to the hospital on 02/05/18 with a several week history of progressive shortness of breath, edema, orthopnea, and PND.  Prior to this admission he had no previously known cardiac history.  Chest x-ray showed cardiomegaly with mild pulmonary vascular congestion.  BNP was noted to be 216.  Troponin peaked at 0.06 and was felt to be demand ischemia in the setting of poorly controlled hypertension and volume overload.  Symptoms improved with IV diuresis.  Echo showed an EF of 55 to 60%, normal wall motion, grade 1 diastolic dysfunction, normal sized left atrium, RV systolic function normal, unable to accurately estimate PASP.  Documented discharge weight 85 kg.  Outpatient  PFTs on 02/21/2018 showed severe restrictive lung disease with positive bronchodilator response which was felt to possibly reflect underlying reactive airway disease.    He was last seen by me on 03/01/2018 and was doing well. His weight remained stable and had an office weight of 85.7 kg. He has since been followed by the CHF clinic, most recently being seen on 04/19/2018 with a weight of 91.6 kg. He was given IV Lasix 80 mg with KCl 40 mEq in the office. Labs checked that day showed a SCr of 1.12, K+ 3.3, BNP 15. He was advised to follow up with general cardiology.   He comes in doing reasonably well.  He has noted a couple week history of increased shortness of  breath with associated weight gain.  No lower extremity swelling, abdominal distention, orthopnea, PND, or early satiety.  He did lose his torsemide 3 days ago and in this setting started taking old Lasix though with this he noted a decreased urine output when compared to torsemide.  He denies any changes in his diet.  No chest pain, dizziness, presyncope, or syncope.  Past Medical History:  Diagnosis Date  . Acid reflux   . Anxiety   . Arthritis   . Asthma   . CHF (congestive heart failure) ([place])   . Depression   . Glaucoma   . Hyperlipidemia   . Hypertension   . Klippel-Feil syndrome     Past Surgical History:  Procedure Laterality Date  . COLOSTOMY    . DENVER SHUNT PLACEMENT      Current Meds  Medication Sig  . albuterol (PROVENTIL HFA;VENTOLIN HFA) 108 (90 Base) MCG/ACT inhaler Inhale 2 puffs into the lungs every 4 (four) hours as needed for wheezing or shortness of breath.  . ANDROGEL PUMP 20.25 MG/ACT (1.62%) GEL Apply 1 application topically daily.   . brinzolamide (AZOPT) 1 % ophthalmic suspension Place 1 drop into both eyes daily.   . bromocriptine (PARLODEL) 2.5 MG tablet Take 7.5 mg by mouth daily.   . diazepam (VALIUM) 2 MG tablet Take 1 tablet by mouth as needed.  . furosemide (LASIX) 40 MG tablet Take 40 mg by mouth 2 (two) times daily.  Marland Kitchen latanoprost (XALATAN) 0.005 % ophthalmic solution Place 1 drop into both eyes at bedtime.   Marland Kitchen  PARoxetine (PAXIL) 40 MG tablet Take 1 tablet (40 mg total) by mouth daily.  . potassium chloride SA (K-DUR,KLOR-CON) 20 MEQ tablet TAKE 1 TABLET(20 MEQ) BY MOUTH DAILY  . quinapril (ACCUPRIL) 20 MG tablet TAKE 1 TABLET BY MOUTH EVERY DAY  . Spacer/Aero-Holding Chambers DEVI 1 Device by Does not apply route 4 (four) times daily.    Allergies:   Codeine   Social History:  The patient  reports that he quit smoking about 11 years ago. His smoking use included cigarettes. He has a 10.00 pack-year smoking history. He has never used  smokeless tobacco. He reports that he does not drink alcohol or use drugs.   Family History:  The patient's family history includes Cancer in his paternal grandfather; Colon cancer in his father; Heart disease in his father; Lung cancer in his maternal grandmother; Multiple sclerosis in his mother; Ovarian cancer in his paternal grandmother; Thyroid cancer in his maternal grandfather.  ROS:   Review of Systems  Constitutional: Positive for malaise/fatigue. Negative for chills, diaphoresis, fever and weight loss.  HENT: Negative for congestion.   Eyes: Negative for discharge and redness.  Respiratory: Positive for shortness of breath. Negative for cough, hemoptysis, sputum production and wheezing.   Cardiovascular: Negative for chest pain, palpitations, orthopnea, claudication, leg swelling and PND.  Gastrointestinal: Negative for abdominal pain, blood in stool, heartburn, melena, nausea and vomiting.  Genitourinary: Negative for hematuria.  Musculoskeletal: Negative for falls and myalgias.  Skin: Negative for rash.  Neurological: Positive for weakness. Negative for dizziness, tingling, tremors, sensory change, speech change, focal weakness and loss of consciousness.  Endo/Heme/Allergies: Does not bruise/bleed easily.  Psychiatric/Behavioral: Negative for substance abuse. The patient is not nervous/anxious.   All other systems reviewed and are negative.    PHYSICAL EXAM:  VS:  BP 132/78 (BP Location: Left Arm, Patient Position: Sitting, Cuff Size: Normal)   Pulse 91   Ht 5\' 2"  (1.575 m)   Wt 205 lb (93 kg)   SpO2 95%   BMI 37.49 kg/m  BMI: Body mass index is 37.49 kg/m.  Physical Exam  Constitutional: He is oriented to person, place, and time. He appears well-developed and well-nourished.  HENT:  Head: Normocephalic and atraumatic.  Eyes: Right eye exhibits no discharge. Left eye exhibits no discharge.  Neck: Normal range of motion. No JVD present.  Cardiovascular: Normal rate,  regular rhythm, S1 normal, S2 normal and normal heart sounds. Exam reveals no distant heart sounds, no friction rub, no midsystolic click and no opening snap.  No murmur heard. Pulses:      Posterior tibial pulses are 2+ on the right side and 2+ on the left side.  Pulmonary/Chest: Effort normal and breath sounds normal. No respiratory distress. He has no decreased breath sounds. He has no wheezes. He has no rales. He exhibits no tenderness.  Abdominal: Soft. He exhibits no distension. There is no abdominal tenderness.  Musculoskeletal:        General: No edema.  Neurological: He is alert and oriented to person, place, and time.  Skin: Skin is warm and dry. No cyanosis. Nails show no clubbing.  Psychiatric: He has a normal mood and affect. His speech is normal and behavior is normal. Judgment and thought content normal.     EKG:  Was ordered and interpreted by me today. Shows NSR, 91 bpm, rare PAC, nonspecific ST-T changes  Recent Labs: 02/04/2018: ALT 52; TSH 2.826 02/13/2018: Magnesium 2.4 02/20/2018: Hemoglobin 16.6; Platelets 355 04/19/2018: B Natriuretic Peptide  15.0; BUN 20; Creatinine, Ser 1.12; Potassium 3.3; Sodium 139  02/07/2018: Cholesterol 155; HDL 55; LDL Cholesterol 85; Total CHOL/HDL Ratio 2.8; Triglycerides 74; VLDL 15   Estimated Creatinine Clearance: 80.7 mL/min (by C-G formula based on SCr of 1.12 mg/dL).   Wt Readings from Last 3 Encounters:  04/27/18 205 lb (93 kg)  04/19/18 202 lb (91.6 kg)  04/13/18 198 lb 6.4 oz (90 kg)     Other studies reviewed: Additional studies/records reviewed today include: summarized above  ASSESSMENT AND PLAN:  1. Acute on chronic HFpEF: Likely exacerbated by the loss of his torsemide recently.  I have advised him to restart torsemide (new prescription sent in) at 80 mg this afternoon, 80 mg twice daily on 1/24 followed by 40 mg twice daily thereafter.  Increase potassium to 40 mEq twice daily while on 80 mg of torsemide twice daily,  he will then go back down to his previous dosing of KCl of 20 mEq daily.  Recent bmet showed stable renal function as above.  He will need follow-up BMP in 1 week at his follow-up visit.  We do not have any availability in our office for this 1 week follow-up thus I have asked that he be scheduled back with the [facility] CHF clinic for recheck labs and ongoing outpatient diuresis as needed.  Patient did ask about IV Lasix today however he had already taken 40 mg of Lasix prior to his appointment this morning and with minimal urine output noted with prior Lasix dosing I have recommended this be deferred at this time.  CHF education provided.  2. Restrictive airway disease: Has been referred to pulmonology, though I do not see where he has been seen by them.  Cannot exclude this playing a role in some of his dyspnea however he is volume up approximately 16 pounds as outlined above.  3. Hypertension: Blood pressure is reasonably controlled today.  Diuresis as above.  Continue quinapril.  4. Hyperlipidemia: LDL of 85 during admission in 02/2018.  Not currently on a statin.  Healthy lifestyle recommended.  5. Hypokalemia: Increase potassium repletion as above.  He will need recheck BMP at follow-up.  Disposition: F/u with Dr. Fletcher Anon or an APP in 1 week.  He will need recheck BMP at that visit.  Current medicines are reviewed at length with the patient today.  The patient did not have any concerns regarding medicines.  Signed, Christell Faith, PA-C 04/27/2018 10:29 AM     [facility] [address] [place] [address] [place],  [address] [phone]

## 2018-04-27 ENCOUNTER — Ambulatory Visit (INDEPENDENT_AMBULATORY_CARE_PROVIDER_SITE_OTHER): Payer: Medicare Other | Admitting: Physician Assistant

## 2018-04-27 ENCOUNTER — Encounter: Payer: Self-pay | Admitting: Physician Assistant

## 2018-04-27 VITALS — BP 132/78 | HR 91 | Ht 62.0 in | Wt 205.0 lb

## 2018-04-27 DIAGNOSIS — E782 Mixed hyperlipidemia: Secondary | ICD-10-CM

## 2018-04-27 DIAGNOSIS — I10 Essential (primary) hypertension: Secondary | ICD-10-CM | POA: Diagnosis not present

## 2018-04-27 DIAGNOSIS — I5033 Acute on chronic diastolic (congestive) heart failure: Secondary | ICD-10-CM

## 2018-04-27 DIAGNOSIS — Q761 Klippel-Feil syndrome: Secondary | ICD-10-CM | POA: Diagnosis not present

## 2018-04-27 DIAGNOSIS — J984 Other disorders of lung: Secondary | ICD-10-CM

## 2018-04-27 MED ORDER — TORSEMIDE 20 MG PO TABS: 20.0000 mg | ORAL_TABLET | Freq: Two times a day (BID) | ORAL | 3 refills | Status: DC

## 2018-04-27 MED ORDER — POTASSIUM CHLORIDE CRYS ER 20 MEQ PO TBCR: 20.0000 meq | EXTENDED_RELEASE_TABLET | Freq: Every day | ORAL | 3 refills | Status: DC

## 2018-04-27 NOTE — Patient Instructions (Signed)
Medication Instructions:  1- stop Lasix  2- Resume Torsemide with special instructions as follows;  Take 4 tablets ( 80 mg total) this afternoon, tomorrow take 4 tablets (80 mg) twice daily, then resume normal dose of 2 tablets (40 mg total) twice daily. 3- Tomorrow only Take potassium chloride 2 tablets (40 mEq total), then resume normal daily dose  If you need a refill on your cardiac medications before your next appointment, please call your pharmacy.   Lab work: None ordered  If you have labs (blood work) drawn today and your tests are completely normal, you will receive your results only by: Marland Kitchen MyChart Message (if you have MyChart) OR . A paper copy in the mail If you have any lab test that is abnormal or we need to change your treatment, we will call you to review the results.  Testing/Procedures: None ordered  Follow-Up: At [facility], you and your health needs are our priority.  As part of our continuing mission to provide you with exceptional heart care, we have created designated Provider Care Teams.  These Care Teams include your primary Cardiologist (physician) and Advanced Practice Providers (APPs -  Physician Assistants and Nurse Practitioners) who all work together to provide you with the care you need, when you need it. You will need a follow up appointment in 1 weeks.   You may see Dr. Fletcher Anon or one of the following Advanced Practice Providers on your designated Care Team:   Murray Hodgkins, NP Christell Faith, PA-C . Marrianne Mood, PA-C

## 2018-05-02 ENCOUNTER — Telehealth: Payer: Self-pay | Admitting: Cardiovascular Disease

## 2018-05-02 DIAGNOSIS — I5033 Acute on chronic diastolic (congestive) heart failure: Secondary | ICD-10-CM

## 2018-05-02 NOTE — Telephone Encounter (Signed)
No answer. Went straight to [employer]. Left message to call back.

## 2018-05-02 NOTE — Telephone Encounter (Signed)
Pt c/o medication issue:  1. Name of Medication: torsemide   2. How are you currently taking this medication (dosage and times per day)? 20 mg po BID   3. Are you having a reaction (difficulty breathing--STAT)? Increased weight again and some sob   4. What is your medication issue? Ryan recently changed torsemide to QID for a few days then back to BID patient noted improvement with QID dose but now is starting to have weight gain again and some sob even at rest   Please call to discuss

## 2018-05-03 MED ORDER — TORSEMIDE 20 MG PO TABS: 60.0000 mg | ORAL_TABLET | Freq: Two times a day (BID) | ORAL | 3 refills | Status: DC

## 2018-05-03 NOTE — Telephone Encounter (Signed)
Increase torsemide to 60 BID until he f/u with CHF clinic on 2/3.  He will need BMET @ that viist.  If he does not note improvement in wt or worsening dyspnea, he will need to present to ED for eval.

## 2018-05-03 NOTE — Telephone Encounter (Signed)
Returned call to pt with c/o SOB, seen last week in clinic on 05/02/18  for same complaint. Per AVS from last OV he was advised to:  "Resume Torsemide with special instructions as follows;             Take 4 tablets ( 80 mg total) this afternoon, tomorrow take 4 tablets (80 mg) twice daily, then resume normal dose of 2 tablets (40 mg total) twice daily."  Pt reported weight loss of 7 lbs in those 2 days and SOB was improved. However since Monday he has gained a total of 4 lbs. Pt audibly SOB and speaks in fragmented sentences. He feels that since Sunday afternoon SOB has slowly worsened and has fatigue when ambulating from house to car. He denies chest pain, difficulty sleeping or walking in the house. Pt reports swelling is mainly in abdomen with "slight" swelling in legs. He feels hungry but is unable to eat much d/t abd swelling.  He did not have current vital signs at this time. Pt request to resume  increase of torsemide dose.   Message routed to provider for further advice. His current f/u is scheduled for 2/24 with Christell Faith, PA. Ryan had requested 1 week but did not have availability at that time.   Call back to patient to make him aware of lab that was requested for Thursday of this week. He feels he will not make it d/t transportation needs until Monday.   I have placed blood order in case he finds a ride.  I advised pt to seek immediate medical care if breathing worsens or if he has any other concerns.  Advised pt to call for any further questions or concerns.

## 2018-05-03 NOTE — Telephone Encounter (Signed)
Patient verbalized understanding to increase torsemide to 60 mg two times a day until follow up with CHF clinic on 05/08/18 and to go to ER if worsening symptoms or no improvement. Med list updated. Routing to [employer], NP to let her know about BMET.

## 2018-05-03 NOTE — Progress Notes (Deleted)
Patient ID: Scott [name], male    DOB: 1970-07-11, 48 y.o.   MRN: [id number]  HPI  Scott [name] is a 49 y/o male with a history of hyperlipidemia, HTN, anxiety, arthritis, depression, klippel-feil syndrome, former tobacco use and chronic heart failure.   Echo report from 02/05/18 reviewed and showed an EF of 55-60%  Admitted 02/04/18 due to acute on chronic HF. Initially needed IV lasix and then transitioned to oral diuretics. Also needed oxygen initially but was then tapered off. Saturations did drop at night so he qualified for nighttime oxygen. Sleep study supposed to be done to rule out sleep apnea. Elevated troponins thought to be due to demand ischemia. Cardiology consult obtained. Discharged after 9 days.   He presents today for a follow-up visit with a chief complaint of   Past Medical History:  Diagnosis Date  . Acid reflux   . Anxiety   . Arthritis   . Asthma   . CHF (congestive heart failure) ([place])   . Depression   . Glaucoma   . Hyperlipidemia   . Hypertension   . Klippel-Feil syndrome    Past Surgical History:  Procedure Laterality Date  . COLOSTOMY    . DENVER SHUNT PLACEMENT     Family History  Problem Relation Age of Onset  . Multiple sclerosis Mother   . Colon cancer Father   . Heart disease Father   . Lung cancer Maternal Grandmother   . Thyroid cancer Maternal Grandfather   . Ovarian cancer Paternal Grandmother   . Cancer Paternal Grandfather    Social History   Tobacco Use  . Smoking status: Former Smoker    Packs/day: 1.00    Years: 10.00    Pack years: 10.00    Types: Cigarettes    Last attempt to quit: 05/2006    Years since quitting: 12.0  . Smokeless tobacco: Never Used  . Tobacco comment: quit 05/2006  Substance Use Topics  . Alcohol use: No    Alcohol/week: 0.0 standard drinks   Allergies  Allergen Reactions  . Codeine Shortness Of Breath     Review of Systems  Constitutional: Positive for fatigue. Negative for appetite change.  HENT:  Negative for congestion, postnasal drip and sore throat.   Eyes: Negative.   Respiratory: Positive for shortness of breath (worsening) and wheezing. Negative for cough and chest tightness.   Cardiovascular: Negative for chest pain, palpitations and leg swelling.  Gastrointestinal: Positive for abdominal distention. Negative for abdominal pain.  Endocrine: Negative.   Genitourinary: Negative.   Musculoskeletal: Positive for arthralgias (right collarbone (tripped over pants and fall yesterday)) and neck pain. Negative for back pain.  Skin: Negative.   Allergic/Immunologic: Negative.   Neurological: Positive for light-headedness. Negative for dizziness.  Hematological: Negative for adenopathy. Does not bruise/bleed easily.  Psychiatric/Behavioral: Negative for dysphoric mood and sleep disturbance (sleeping on 1 pillow). The patient is nervous/anxious.      Physical Exam Vitals signs and nursing note reviewed.  Constitutional:      Appearance: He is well-developed.  HENT:     Head: Normocephalic and atraumatic.  Neck:     Musculoskeletal: Normal range of motion and neck supple.     Vascular: No JVD.  Cardiovascular:     Rate and Rhythm: Normal rate and regular rhythm.  Pulmonary:     Effort: Pulmonary effort is normal. No respiratory distress.     Breath sounds: Wheezing (throughout all lung fields) present. No rales.  Abdominal:  General: There is distension.     Tenderness: There is no abdominal tenderness.     Comments: tight  Musculoskeletal:        General: No tenderness.     Right lower leg: He exhibits no tenderness. No edema.     Left lower leg: He exhibits no tenderness. No edema.  Skin:    General: Skin is warm and dry.  Neurological:     Mental Status: He is alert and oriented to person, place, and time.  Psychiatric:        Behavior: Behavior normal.        Thought Content: Thought content normal.    Assessment & Plan:  1: Acute on Chronic heart failure  with preserved ejection fraction- - NYHA class III - moderately fluid overloaded today with weight gain, worsening abdominal distention and worsening shortness of breath - weighing daily; instructed to call for an overnight weight gain of >2 pounds or a weekly weight gain of >5 pounds - weight  - [employer] had previously denied metolazone even with PA -  - not adding salt and has been reading food labels. Reviewed the importance of closely following a 2000mg  sodium diet - saw cardiology (Dunn) 03/01/18 & f/u was scheduled for 04/24/2018 - BNP on 02/04/18 was 216.0 - patient does not get the flu vaccine; good handwashing encouraged  2: HTN- - BP  - saw PCP (Chauvin) 01/11/18 - BMP from 04/19/2018 reviewed and showed sodium 139, potassium 3.3, creatinine 1.12 and GFR >60  3: Klippel-Fiel syndrome- - saw pulmonology Patsey Berthold) 01/25/18 - emphasized that he needed to call his pulmonologist for f/u appointment - had PFT's 02/21/18 - wears oxygen at 2L PRN  Medication list was reviewed.

## 2018-05-04 ENCOUNTER — Ambulatory Visit: Payer: Medicare Other | Admitting: Family

## 2018-05-05 ENCOUNTER — Ambulatory Visit: Payer: Medicare Other | Admitting: Pulmonary Disease

## 2018-05-05 NOTE — Telephone Encounter (Signed)
Called pt to check on SOB sx. No answer. LMTCB 1/31

## 2018-05-05 NOTE — Progress Notes (Deleted)
Patient ID: Scott [name], male    DOB: 19-Mar-1971, 48 y.o.   MRN: [id number]  HPI  Scott [name] is a 48 y/o male with a history of hyperlipidemia, HTN, anxiety, arthritis, depression, klippel-feil syndrome, former tobacco use and chronic heart failure.   Echo report from 02/05/18 reviewed and showed an EF of 55-60%  Admitted 02/04/18 due to acute on chronic HF. Initially needed IV lasix and then transitioned to oral diuretics. Also needed oxygen initially but was then tapered off. Saturations did drop at night so he qualified for nighttime oxygen. Sleep study supposed to be done to rule out sleep apnea. Elevated troponins thought to be due to demand ischemia. Cardiology consult obtained. Discharged after 9 days.   He presents today for a follow-up visit with a chief complaint of   Past Medical History:  Diagnosis Date  . Acid reflux   . Anxiety   . Arthritis   . Asthma   . CHF (congestive heart failure) ([place])   . Depression   . Glaucoma   . Hyperlipidemia   . Hypertension   . Klippel-Feil syndrome    Past Surgical History:  Procedure Laterality Date  . COLOSTOMY    . DENVER SHUNT PLACEMENT     Family History  Problem Relation Age of Onset  . Multiple sclerosis Mother   . Colon cancer Father   . Heart disease Father   . Lung cancer Maternal Grandmother   . Thyroid cancer Maternal Grandfather   . Ovarian cancer Paternal Grandmother   . Cancer Paternal Grandfather    Social History   Tobacco Use  . Smoking status: Former Smoker    Packs/day: 1.00    Years: 10.00    Pack years: 10.00    Types: Cigarettes    Last attempt to quit: 05/2006    Years since quitting: 12.0  . Smokeless tobacco: Never Used  . Tobacco comment: quit 05/2006  Substance Use Topics  . Alcohol use: No    Alcohol/week: 0.0 standard drinks   Allergies  Allergen Reactions  . Codeine Shortness Of Breath     Review of Systems  Constitutional: Positive for fatigue. Negative for appetite change.  HENT:  Negative for congestion, postnasal drip and sore throat.   Eyes: Negative.   Respiratory: Positive for shortness of breath (worsening) and wheezing. Negative for cough and chest tightness.   Cardiovascular: Negative for chest pain, palpitations and leg swelling.  Gastrointestinal: Positive for abdominal distention. Negative for abdominal pain.  Endocrine: Negative.   Genitourinary: Negative.   Musculoskeletal: Positive for arthralgias (right collarbone (tripped over pants and fall yesterday)) and neck pain. Negative for back pain.  Skin: Negative.   Allergic/Immunologic: Negative.   Neurological: Positive for light-headedness. Negative for dizziness.  Hematological: Negative for adenopathy. Does not bruise/bleed easily.  Psychiatric/Behavioral: Negative for dysphoric mood and sleep disturbance (sleeping on 1 pillow). The patient is nervous/anxious.      Physical Exam Vitals signs and nursing note reviewed.  Constitutional:      Appearance: He is well-developed.  HENT:     Head: Normocephalic and atraumatic.  Neck:     Musculoskeletal: Normal range of motion and neck supple.     Vascular: No JVD.  Cardiovascular:     Rate and Rhythm: Normal rate and regular rhythm.  Pulmonary:     Effort: Pulmonary effort is normal. No respiratory distress.     Breath sounds: Wheezing (throughout all lung fields) present. No rales.  Abdominal:  General: There is distension.     Tenderness: There is no abdominal tenderness.     Comments: tight  Musculoskeletal:        General: No tenderness.     Right lower leg: He exhibits no tenderness. No edema.     Left lower leg: He exhibits no tenderness. No edema.  Skin:    General: Skin is warm and dry.  Neurological:     Mental Status: He is alert and oriented to person, place, and time.  Psychiatric:        Behavior: Behavior normal.        Thought Content: Thought content normal.    Assessment & Plan:  1: Acute on Chronic heart failure  with preserved ejection fraction- - NYHA class III - moderately fluid overloaded today with weight gain, worsening abdominal distention and worsening shortness of breath - weighing daily; instructed to call for an overnight weight gain of >2 pounds or a weekly weight gain of >5 pounds - weight  - [employer] had previously denied metolazone even with PA -  - not adding salt and has been reading food labels. Reviewed the importance of closely following a 2000mg  sodium diet - saw cardiology (Dunn) 03/01/18 & f/u was scheduled for 04/24/2018 - BNP on 02/04/18 was 216.0 - patient does not get the flu vaccine; good handwashing encouraged  2: HTN- - BP  - saw PCP (Chauvin) 01/11/18 - BMP from 04/19/2018 reviewed and showed sodium 139, potassium 3.3, creatinine 1.12 and GFR >60  3: Klippel-Fiel syndrome- - saw pulmonology Patsey Berthold) 01/25/18 - emphasized that he needed to call his pulmonologist for f/u appointment - had PFT's 02/21/18 - wears oxygen at 2L PRN  Medication list was reviewed.

## 2018-05-08 ENCOUNTER — Ambulatory Visit: Payer: Medicare Other | Admitting: Family

## 2018-05-09 NOTE — Progress Notes (Signed)
Patient ID: Scott [name], male    DOB: [birth year], 48 y.o.   MRN: [id number]  HPI  Mr Hardenbrook is a 48 y/o male with a history of hyperlipidemia, HTN, anxiety, arthritis, depression, klippel-feil syndrome, former tobacco use and chronic heart failure.   Echo report from 02/05/18 reviewed and showed an EF of 55-60%  Admitted 02/04/18 due to acute on chronic HF. Initially needed IV lasix and then transitioned to oral diuretics. Also needed oxygen initially but was then tapered off. Saturations did drop at night so he qualified for nighttime oxygen. Sleep study supposed to be done to rule out sleep apnea. Elevated troponins thought to be due to demand ischemia. Cardiology consult obtained. Discharged after 9 days.   He presents today for a follow-up visit with a chief complaint of moderate fatigue upon minimal exertion. He describes this as chronic in nature having been present for several years. He has associated shortness of breath, wheezing, abdominal distention and light-headedness along with this. He denies any chest tightness, cough, chest pain, pedal edema, palpitations, difficulty sleeping or weight gain. He says that he had .good results when he took torsemide 80mg  BID but when it was decreased back to 60mg  BID, he feels like his abdominal distention has returned.   Past Medical History:  Diagnosis Date  . Acid reflux   . Anxiety   . Arthritis   . Asthma   . CHF (congestive heart failure) ([place])   . Depression   . Glaucoma   . Hyperlipidemia   . Hypertension   . Klippel-Feil syndrome    Past Surgical History:  Procedure Laterality Date  . COLOSTOMY    . DENVER SHUNT PLACEMENT     Family History  Problem Relation Age of Onset  . Multiple sclerosis Mother   . Colon cancer Father   . Heart disease Father   . Lung cancer Maternal Grandmother   . Thyroid cancer Maternal Grandfather   . Ovarian cancer Paternal Grandmother   . Cancer Paternal Grandfather    Social History   Tobacco  Use  . Smoking status: Former Smoker    Packs/day: 1.00    Years: 10.00    Pack years: 10.00    Types: Cigarettes    Last attempt to quit: 05/2006    Years since quitting: 12.0  . Smokeless tobacco: Never Used  . Tobacco comment: quit 05/2006  Substance Use Topics  . Alcohol use: No    Alcohol/week: 0.0 standard drinks   Allergies  Allergen Reactions  . Codeine Shortness Of Breath   Prior to Admission medications   Medication Sig Start Date End Date Taking? Authorizing Provider  albuterol (PROVENTIL HFA;VENTOLIN HFA) 108 (90 Base) MCG/ACT inhaler Inhale 2 puffs into the lungs every 4 (four) hours as needed for wheezing or shortness of breath. 01/11/18  Yes Chauvin, Herbie [place], PA  ANDROGEL PUMP 20.25 MG/ACT (1.62%) GEL Apply 1 application topically daily.  09/04/14  Yes [provider]  b complex vitamins capsule Take 1 capsule by mouth daily.   Yes [provider]  brinzolamide (AZOPT) 1 % ophthalmic suspension Place 1 drop into both eyes daily.    Yes [provider]  bromocriptine (PARLODEL) 2.5 MG tablet Take 5 mg by mouth daily.  09/10/14  Yes [provider]  diazepam (VALIUM) 2 MG tablet Take 1 tablet by mouth as needed.   Yes [provider]  glucosamine-chondroitin 500-400 MG tablet Take 2 tablets by mouth daily.   Yes [provider]  latanoprost (XALATAN) 0.005 % ophthalmic solution Place 1 drop into both eyes at bedtime.  07/28/07  Yes [provider]  Magnesium 500 MG TABS Take 1 tablet by mouth daily. Patient says he takes magnesium orotate   Yes [provider]  Omega-3 Fatty Acids (OMEGA ESSENTIALS BASIC) LIQD Take 15 mLs by mouth daily.   Yes [provider]  PARoxetine (PAXIL) 40 MG tablet Take 1 tablet (40 mg total) by mouth daily. 09/29/17  Yes [name], [name], PA  potassium chloride SA (K-DUR,KLOR-CON) 20 MEQ tablet Take 1 tablet (20 mEq total) by mouth daily. 04/27/18  Yes Dunn, Ryan M, PA-C   quinapril (ACCUPRIL) 20 MG tablet TAKE 1 TABLET BY MOUTH EVERY DAY 03/03/18  Yes [name], [name], PA  Spacer/Aero-Holding Chambers DEVI 1 Device by Does not apply route 4 (four) times daily. 03/01/18  Yes Dunn, Areta Haber, PA-C  torsemide (DEMADEX) 20 MG tablet Take 3 tablets (60 mg total) by mouth 2 (two) times daily. 05/03/18 08/01/18 Yes Theora Gianotti, NP    Review of Systems  Constitutional: Positive for appetite change (decreased) and fatigue.  HENT: Negative for congestion, postnasal drip and sore throat.   Eyes: Negative.   Respiratory: Positive for shortness of breath (better today) and wheezing. Negative for cough and chest tightness.   Cardiovascular: Negative for chest pain, palpitations and leg swelling.  Gastrointestinal: Positive for abdominal distention (improving). Negative for abdominal pain.  Endocrine: Negative.   Genitourinary: Negative.   Musculoskeletal: Positive for neck pain (better). Negative for arthralgias ()) and back pain.  Skin: Negative.   Allergic/Immunologic: Negative.   Neurological: Positive for light-headedness (yesterday). Negative for dizziness.  Hematological: Negative for adenopathy. Does not bruise/bleed easily.  Psychiatric/Behavioral: Negative for dysphoric mood and sleep disturbance (sleeping on 1 pillow). The patient is not nervous/anxious.    Vitals:   05/10/18 1031  BP: 134/81  Pulse: 93  Resp: 18  Weight: 202 lb (91.6 kg)  Height: 5\' 2"  (1.575 m)   Wt Readings from Last 3 Encounters:  05/10/18 202 lb (91.6 kg)  04/27/18 205 lb (93 kg)  04/19/18 202 lb (91.6 kg)   Lab Results  Component Value Date   CREATININE 1.12 04/19/2018   CREATININE 1.26 (H) 04/04/2018   CREATININE 1.36 (H) 02/20/2018    Physical Exam Vitals signs and nursing note reviewed.  Constitutional:      Appearance: He is well-developed.  HENT:     Head: Normocephalic and atraumatic.  Neck:     Musculoskeletal: Normal range of motion and neck  supple.     Vascular: No JVD.  Cardiovascular:     Rate and Rhythm: Normal rate and regular rhythm.  Pulmonary:     Effort: Pulmonary effort is normal. No respiratory distress.     Breath sounds: Wheezing (slight expiratory throughout all lung fields) present. No rales.  Abdominal:     General: There is distension.     Tenderness: There is no abdominal tenderness.     Comments: tight  Musculoskeletal:        General: No tenderness.     Right lower leg: He exhibits no tenderness. No edema.     Left lower leg: He exhibits no tenderness. No edema.  Skin:    General: Skin is warm and dry.  Neurological:     Mental Status: He is alert and oriented to person, place, and time.  Psychiatric:        Behavior: Behavior normal.  Thought Content: Thought content normal.    Assessment & Plan:  1: Acute on Chronic heart failure with preserved ejection fraction- - NYHA class III - fluid overloaded today  - weighing daily; instructed to call for an overnight weight gain of >2 pounds or a weekly weight gain of >5 pounds - weight unchanged from previous visit from 3 weeks ago; down 3 pounds from cardiology visit 2 weeks ago - [employer] had previously denied metolazone even with PA - not adding salt and has been reading food labels. Reviewed the importance of closely following a 2000mg  sodium diet - saw cardiology (Dunn) 04/27/2018 - patient has been taking increased diuretic so will get BMP today; depending on those results, may adjust diuretic - BNP on 02/04/18 was 216.0 - patient does not get the flu vaccine; good handwashing encouraged - PharmD reconciled medications with the patient  2: HTN- - BP looks good today - saw PCP (Chauvin) 01/11/18 - BMP from 04/19/2018 reviewed and showed sodium 139, potassium 3.3, creatinine 1.12 and GFR >60  3: Klippel-Fiel syndrome- - saw pulmonology Patsey Berthold) 01/25/18 - he says that he has an appointment scheduled with his pulmonologist but can't  remember when it is - had PFT's 02/21/18 - wears oxygen at 2L PRN  Medication list was reviewed.  Return in 1 month or sooner for any questions/problems before then.

## 2018-05-10 ENCOUNTER — Ambulatory Visit: Payer: Medicare Other | Attending: Family | Admitting: Family

## 2018-05-10 ENCOUNTER — Telehealth: Payer: Self-pay | Admitting: Family

## 2018-05-10 ENCOUNTER — Encounter: Payer: Self-pay | Admitting: Pharmacist

## 2018-05-10 ENCOUNTER — Encounter: Payer: Self-pay | Admitting: Family

## 2018-05-10 ENCOUNTER — Other Ambulatory Visit: Payer: Self-pay | Admitting: Family Medicine

## 2018-05-10 VITALS — BP 134/81 | HR 93 | Resp 18 | Ht 62.0 in | Wt 202.0 lb

## 2018-05-10 DIAGNOSIS — F418 Other specified anxiety disorders: Secondary | ICD-10-CM

## 2018-05-10 DIAGNOSIS — I110 Hypertensive heart disease with heart failure: Secondary | ICD-10-CM | POA: Diagnosis not present

## 2018-05-10 DIAGNOSIS — R0602 Shortness of breath: Secondary | ICD-10-CM | POA: Insufficient documentation

## 2018-05-10 DIAGNOSIS — Z8249 Family history of ischemic heart disease and other diseases of the circulatory system: Secondary | ICD-10-CM | POA: Diagnosis not present

## 2018-05-10 DIAGNOSIS — R140 Abdominal distension (gaseous): Secondary | ICD-10-CM | POA: Insufficient documentation

## 2018-05-10 DIAGNOSIS — Q761 Klippel-Feil syndrome: Secondary | ICD-10-CM | POA: Insufficient documentation

## 2018-05-10 DIAGNOSIS — J45909 Unspecified asthma, uncomplicated: Secondary | ICD-10-CM | POA: Insufficient documentation

## 2018-05-10 DIAGNOSIS — Z87891 Personal history of nicotine dependence: Secondary | ICD-10-CM | POA: Insufficient documentation

## 2018-05-10 DIAGNOSIS — R5383 Other fatigue: Secondary | ICD-10-CM | POA: Diagnosis not present

## 2018-05-10 DIAGNOSIS — E785 Hyperlipidemia, unspecified: Secondary | ICD-10-CM | POA: Insufficient documentation

## 2018-05-10 DIAGNOSIS — H409 Unspecified glaucoma: Secondary | ICD-10-CM | POA: Insufficient documentation

## 2018-05-10 DIAGNOSIS — M1990 Unspecified osteoarthritis, unspecified site: Secondary | ICD-10-CM | POA: Insufficient documentation

## 2018-05-10 DIAGNOSIS — I10 Essential (primary) hypertension: Secondary | ICD-10-CM

## 2018-05-10 DIAGNOSIS — I5033 Acute on chronic diastolic (congestive) heart failure: Secondary | ICD-10-CM | POA: Insufficient documentation

## 2018-05-10 DIAGNOSIS — F419 Anxiety disorder, unspecified: Secondary | ICD-10-CM | POA: Insufficient documentation

## 2018-05-10 DIAGNOSIS — F329 Major depressive disorder, single episode, unspecified: Secondary | ICD-10-CM | POA: Diagnosis not present

## 2018-05-10 DIAGNOSIS — Z79899 Other long term (current) drug therapy: Secondary | ICD-10-CM | POA: Diagnosis not present

## 2018-05-10 DIAGNOSIS — R42 Dizziness and giddiness: Secondary | ICD-10-CM | POA: Insufficient documentation

## 2018-05-10 DIAGNOSIS — I5032 Chronic diastolic (congestive) heart failure: Secondary | ICD-10-CM

## 2018-05-10 LAB — BASIC METABOLIC PANEL
ANION GAP: 8 (ref 5–15)
BUN: 24 mg/dL — ABNORMAL HIGH (ref 6–20)
CO2: 34 mmol/L — ABNORMAL HIGH (ref 22–32)
Calcium: 8.7 mg/dL — ABNORMAL LOW (ref 8.9–10.3)
Chloride: 98 mmol/L (ref 98–111)
Creatinine, Ser: 1.13 mg/dL (ref 0.61–1.24)
GFR calc Af Amer: >60 mL/min (ref >60)
Glucose, Bld: 96 mg/dL (ref 70–99)
Potassium: 3.8 mmol/L (ref 3.5–5.1)
Sodium: 140 mmol/L (ref 135–145)

## 2018-05-10 NOTE — Progress Notes (Signed)
[place] COUNSELING NOTE   ASSESSMENT   Scott [name] presents to the heart failure clinic for routine follow up. He had questions about diuretic dosing. He reports his weight is up about 15 pounds since last discharge and the three torsemide 20 mg tablets BID is just keeping him at the increased weight. His insurance did not pay for metolazone. He took increased diuretic (4 tabs BID) this weekend and noticed some weight come off but it came right back when he resumed his prescribed dose.   Adherence assessment   Patient is using habit as an adherence strategy.  His medications are covered through his insurance but he has trouble affording supplements.    Do you ever forget to take your medication? [] Yes (1) [x] No (0)  Do you ever skip doses due to side effects? [] Yes (1) [x] No (0)  Do you have trouble affording your medicines? [x] Yes (1) [] No (0)  Are you ever unable to pick up your medication due to transportation difficulties? [] Yes (1) [x] No (0)  Do you ever stop taking your medications because you don't believe they are helping? [] Yes (1) [x] No (0)  Total score _1______      Guideline-Directed Medical Therapy/Evidence Based Medicine   ACE/ARB/ARNI: Yes   Beta Blocker: No   Aldosterone Antagonist: No Diuretic: Yes   Drug related problem 1: Additional therapy needed. Patient reports SOB and weight gain (approximately 15 pounds above baseline). His PA for metolazone was denied. He reports he has not been exercising since his discharge and his activity is limited due to shortness of breath.    Drug related problem 2: Adherence. Patient is adjusting his own diuretic dose   PLAN   Drug related problem 1: Encouraged physical activity as able to help remove fluid.    Drug related problem 2: Scott [name] was encouraged to call the clinic for weight gain of 2 to 3 pounds overnight or 5 pounds in a week.   SUBJECTIVE   HPI: Mr.  [name] presents to the heart failure clinic for routine follow up. He has been struggling with fluid accumulation.    Past Medical History:  Diagnosis Date  . Acid reflux   . Anxiety   . Arthritis   . Asthma   . CHF (congestive heart failure) ([place])   . Depression   . Glaucoma   . Hyperlipidemia   . Hypertension   . Klippel-Feil syndrome       Current Outpatient Medications:  .  albuterol (PROVENTIL HFA;VENTOLIN HFA) 108 (90 Base) MCG/ACT inhaler, Inhale 2 puffs into the lungs every 4 (four) hours as needed for wheezing or shortness of breath., Disp: 1 Inhaler, Rfl: 2 .  ANDROGEL PUMP 20.25 MG/ACT (1.62%) GEL, Apply 1 application topically daily. , Disp: , Rfl:  .  b complex vitamins capsule, Take 1 capsule by mouth daily., Disp: , Rfl:  .  brinzolamide (AZOPT) 1 % ophthalmic suspension, Place 1 drop into both eyes daily. , Disp: , Rfl:  .  bromocriptine (PARLODEL) 2.5 MG tablet, Take 5 mg by mouth daily. , Disp: , Rfl: 0 .  diazepam (VALIUM) 2 MG tablet, Take 1 tablet by mouth as needed., Disp: , Rfl:  .  glucosamine-chondroitin 500-400 MG tablet, Take 2 tablets by mouth daily., Disp: , Rfl:  .  latanoprost (XALATAN) 0.005 % ophthalmic solution, Place 1 drop into both eyes at bedtime. , Disp: , Rfl:  .  Magnesium 500 MG TABS, Take 1  tablet by mouth daily. Patient says he takes magnesium orotate, Disp: , Rfl:  .  Omega-3 Fatty Acids (OMEGA ESSENTIALS BASIC) LIQD, Take 15 mLs by mouth daily., Disp: , Rfl:  .  PARoxetine (PAXIL) 40 MG tablet, Take 1 tablet (40 mg total) by mouth daily., Disp: 30 tablet, Rfl: 0 .  potassium chloride SA (K-DUR,KLOR-CON) 20 MEQ tablet, Take 1 tablet (20 mEq total) by mouth daily., Disp: 90 tablet, Rfl: 3 .  quinapril (ACCUPRIL) 20 MG tablet, TAKE 1 TABLET BY MOUTH EVERY DAY, Disp: 90 tablet, Rfl: 3 .  Spacer/Aero-Holding Chambers DEVI, 1 Device by Does not apply route 4 (four) times daily., Disp: 1 each, Rfl: 0 .  torsemide (DEMADEX) 20 MG tablet, Take 3  tablets (60 mg total) by mouth 2 (two) times daily., Disp: 180 tablet, Rfl: 3    OBJECTIVE    BMP Latest Ref Rng & Units 04/19/2018 04/04/2018 02/20/2018  Glucose 70 - 99 mg/dL 130(H) 173(H) 75  BUN 6 - 20 mg/dL 20 29(H) 20  Creatinine 0.61 - 1.24 mg/dL 1.12 1.26(H) 1.36(H)  BUN/Creat Ratio 9 - 20 - - 15  Sodium 135 - 145 mmol/L 139 140 140  Potassium 3.5 - 5.1 mmol/L 3.3(L) 3.8 4.3  Chloride 98 - 111 mmol/L 97(L) 97(L) 90(L)  CO2 22 - 32 mmol/L 31 33(H) 32(H)  Calcium 8.9 - 10.3 mg/dL 8.7(L) 9.0 10.0    Vital signs: HR  90, BP 134/81, weight (pounds) 202  ECHO: Date 02/05/2018, EF 55 to 60%  Cath: Date N/A, EF N/A   DRUGS TO AVOID IN HEART FAILURE  Drug or Class Mechanism  Analgesics . NSAIDs . COX-2 inhibitors . Glucocorticoids  Sodium and water retention, increased systemic vascular resistance, decreased response to diuretics   Diabetes Medications . Metformin . Thiazolidinediones o Rosiglitazone (Avandia) o Pioglitazone (Actos) . DPP4 Inhibitors o Saxagliptin (Onglyza) o Sitagliptin (Januvia)   Lactic acidosis Possible calcium channel blockade   Unknown  Antiarrhythmics . Class I  o Flecainide o Disopyramide . Class III o Sotalol . Other o Dronedarone  Negative inotrope, proarrhythmic   Proarrhythmic, beta blockade  Negative inotrope  Antihypertensives . Alpha Blockers o Doxazosin . Calcium Channel Blockers o Diltiazem o Verapamil o Nifedipine . Central Alpha Adrenergics o Moxonidine . Peripheral Vasodilators o Minoxidil  Increases renin and aldosterone  Negative inotrope    Possible sympathetic withdrawal  Unknown  Anti-infective . Itraconazole . Amphotericin B  Negative inotrope Unknown  Hematologic . Anagrelide . Cilostazol   Possible inhibition of PD IV Inhibition of PD III causing arrhythmias  Neurologic/Psychiatric . Stimulants . Anti-Seizure  Drugs o Carbamazepine o Pregabalin . Antidepressants o Tricyclics o Citalopram . Parkinsons o Bromocriptine o Pergolide o Pramipexole . Antipsychotics o Clozapine . Antimigraine o Ergotamine o Methysergide . Appetite suppressants . Bipolar o Lithium  Peripheral alpha and beta agonist activity  Negative inotrope and chronotrope Calcium channel blockade  Negative inotrope, proarrhythmic Dose-dependent QT prolongation  Excessive serotonin activity/valvular damage Excessive serotonin activity/valvular damage Unknown  IgE mediated hypersensitivy, calcium channel blockade  Excessive serotonin activity/valvular damage Excessive serotonin activity/valvular damage Valvular damage  Direct myofibrillar degeneration, adrenergic stimulation  Antimalarials . Chloroquine . Hydroxychloroquine Intracellular inhibition of lysosomal enzymes  Urologic Agents . Alpha Blockers o Doxazosin o Prazosin o Tamsulosin o Terazosin  Increased renin and aldosterone  Adapted from Page RL, et al. "Drugs That May Cause or Exacerbate Heart Failure: A Scientific Statement from the [place]." Circulation 2016; 902:I09-B35. DOI: 10.1161/CIR.0000000000000426  COUNSELING POINTS/CLINICAL PEARLS   Torsemide  Side effects may include excessive urination.  Tell patient to report symptoms of ototoxicity.  Instruct patient to report lightheadedness or syncope.  Warn patient to avoid use of nonprescription NSAID products without first discussing it with their healthcare provider.   MEDICATION ADHERENCES TIPS AND STRATEGIES 1. Taking medication as prescribed improves patient outcomes in heart failure (reduces hospitalizations, improves symptoms, increases survival) 2. Side effects of medications can be managed by decreasing doses, switching agents, stopping drugs, or adding additional therapy. Please let someone in the [facility] know if you have having bothersome side  effects so we can modify your regimen. Do not alter your medication regimen without talking to [place].  3. Medication reminders can help patients remember to take drugs on time. If you are missing or forgetting doses you can try linking behaviors, using pill boxes, or an electronic reminder like an alarm on your phone or an app. Some people can also get automated phone calls as medication reminders.   Time spent: 10 minutes  Laural Benes, Pharm.D. Clinical Pharmacist 05/10/2018 10:43 AM

## 2018-05-10 NOTE — Telephone Encounter (Signed)
Pt asking if Simona Huh could fill his Rx:  PARoxetine (PAXIL) 40 MG tablet   Emergency situation. Can call pt to discuss.  If it can be filled, please fill at: [facility] [place], [place] [phone] (Phone) [phone] (Fax)   Thanks, [employer]

## 2018-05-10 NOTE — Patient Instructions (Signed)
Continue weighing daily and call for an overnight weight gain of > 2 pounds or a weekly weight gain of >5 pounds. 

## 2018-05-10 NOTE — Telephone Encounter (Signed)
Spoke to patient and advised him of his lab results that were obtained today (05/10/2018). Advised that potassium level was normal and his renal function looked good.   Advised him to increase his morning torsemide to 80mg  and continue his PM dose of torsemide of 60mg . Will ask cardiology to check a BMP at his visit with them on 05/29/2018.   Patient verbalized understanding of medication change.

## 2018-05-11 ENCOUNTER — Telehealth: Payer: Self-pay | Admitting: *Deleted

## 2018-05-11 MED ORDER — PAROXETINE HCL 40 MG PO TABS: 40.0000 mg | ORAL_TABLET | Freq: Every day | ORAL | 11 refills | Status: DC

## 2018-05-11 NOTE — Telephone Encounter (Signed)
-----   Message from Alisa Graff, [place] sent at 05/10/2018  7:39 PM EST ----- Regarding: labs 2/24 I saw this mutual patient today and checked a BMP which showed normal potassium/ renal function. I instructed him to increase his AM dose of torsemide to 80mg  and continue his PM dose of torsemide at 60mg .   He sees Ryan on 2/24 and I wanted to see if he would re-check a BMP at that visit.  Thanks,  Otila Kluver

## 2018-05-11 NOTE — Telephone Encounter (Signed)
Routing to [place] to make him aware.

## 2018-05-15 ENCOUNTER — Ambulatory Visit: Payer: Medicare Other | Admitting: Family

## 2018-05-19 ENCOUNTER — Other Ambulatory Visit: Payer: Self-pay | Admitting: Family

## 2018-05-19 ENCOUNTER — Encounter: Payer: Self-pay | Admitting: Family

## 2018-05-19 ENCOUNTER — Ambulatory Visit
Admission: RE | Admit: 2018-05-19 | Discharge: 2018-05-19 | Disposition: A | Discharge status: Home / Self Care | Payer: Medicare Other | Source: Ambulatory Visit | Attending: Family | Admitting: Family

## 2018-05-19 ENCOUNTER — Ambulatory Visit: Payer: Medicare Other | Admitting: Family

## 2018-05-19 VITALS — BP 131/84 | HR 83 | Temp 98.6°F | Resp 18 | Ht 62.0 in | Wt 206.0 lb

## 2018-05-19 DIAGNOSIS — M1990 Unspecified osteoarthritis, unspecified site: Secondary | ICD-10-CM | POA: Insufficient documentation

## 2018-05-19 DIAGNOSIS — Z87891 Personal history of nicotine dependence: Secondary | ICD-10-CM | POA: Insufficient documentation

## 2018-05-19 DIAGNOSIS — I5031 Acute diastolic (congestive) heart failure: Secondary | ICD-10-CM

## 2018-05-19 DIAGNOSIS — I10 Essential (primary) hypertension: Secondary | ICD-10-CM

## 2018-05-19 DIAGNOSIS — H409 Unspecified glaucoma: Secondary | ICD-10-CM | POA: Diagnosis not present

## 2018-05-19 DIAGNOSIS — F329 Major depressive disorder, single episode, unspecified: Secondary | ICD-10-CM | POA: Insufficient documentation

## 2018-05-19 DIAGNOSIS — R05 Cough: Secondary | ICD-10-CM | POA: Insufficient documentation

## 2018-05-19 DIAGNOSIS — R0602 Shortness of breath: Secondary | ICD-10-CM | POA: Diagnosis not present

## 2018-05-19 DIAGNOSIS — R140 Abdominal distension (gaseous): Secondary | ICD-10-CM | POA: Diagnosis not present

## 2018-05-19 DIAGNOSIS — Z8249 Family history of ischemic heart disease and other diseases of the circulatory system: Secondary | ICD-10-CM | POA: Insufficient documentation

## 2018-05-19 DIAGNOSIS — F419 Anxiety disorder, unspecified: Secondary | ICD-10-CM | POA: Insufficient documentation

## 2018-05-19 DIAGNOSIS — Q761 Klippel-Feil syndrome: Secondary | ICD-10-CM

## 2018-05-19 DIAGNOSIS — R079 Chest pain, unspecified: Secondary | ICD-10-CM | POA: Diagnosis not present

## 2018-05-19 DIAGNOSIS — I5033 Acute on chronic diastolic (congestive) heart failure: Secondary | ICD-10-CM | POA: Diagnosis not present

## 2018-05-19 DIAGNOSIS — R509 Fever, unspecified: Secondary | ICD-10-CM | POA: Insufficient documentation

## 2018-05-19 DIAGNOSIS — Z79899 Other long term (current) drug therapy: Secondary | ICD-10-CM | POA: Diagnosis not present

## 2018-05-19 DIAGNOSIS — J45909 Unspecified asthma, uncomplicated: Secondary | ICD-10-CM | POA: Diagnosis not present

## 2018-05-19 DIAGNOSIS — E785 Hyperlipidemia, unspecified: Secondary | ICD-10-CM | POA: Diagnosis not present

## 2018-05-19 DIAGNOSIS — R5383 Other fatigue: Secondary | ICD-10-CM | POA: Insufficient documentation

## 2018-05-19 DIAGNOSIS — I110 Hypertensive heart disease with heart failure: Secondary | ICD-10-CM | POA: Insufficient documentation

## 2018-05-19 LAB — BASIC METABOLIC PANEL
ANION GAP: 8 (ref 5–15)
BUN: 24 mg/dL — ABNORMAL HIGH (ref 6–20)
CALCIUM: 9.1 mg/dL (ref 8.9–10.3)
CO2: 34 mmol/L — ABNORMAL HIGH (ref 22–32)
Chloride: 95 mmol/L — ABNORMAL LOW (ref 98–111)
Creatinine, Ser: 1.37 mg/dL — ABNORMAL HIGH (ref 0.61–1.24)
GFR calc Af Amer: >60 mL/min (ref >60)
GFR calc non Af Amer: >60 mL/min (ref >60)
Glucose, Bld: 107 mg/dL — ABNORMAL HIGH (ref 70–99)
Potassium: 3.3 mmol/L — ABNORMAL LOW (ref 3.5–5.1)
Sodium: 137 mmol/L (ref 135–145)

## 2018-05-19 LAB — BRAIN NATRIURETIC PEPTIDE: B Natriuretic Peptide: 26 pg/mL (ref 0.0–100.0)

## 2018-05-19 MED ORDER — FUROSEMIDE 10 MG/ML IJ SOLN
INTRAMUSCULAR | Status: AC
  Administered 2018-05-19: 80 mg via INTRAVENOUS
  Filled 2018-05-19: qty 8

## 2018-05-19 MED ORDER — SODIUM CHLORIDE FLUSH 0.9 % IV SOLN
INTRAVENOUS | Status: AC
  Filled 2018-05-19: qty 20

## 2018-05-19 MED ORDER — POTASSIUM CHLORIDE CRYS ER 20 MEQ PO TBCR
EXTENDED_RELEASE_TABLET | ORAL | Status: AC
  Filled 2018-05-19: qty 2

## 2018-05-19 MED ORDER — FUROSEMIDE 10 MG/ML IJ SOLN
80.0000 mg | Freq: Once | INTRAMUSCULAR | Status: AC
  Administered 2018-05-19: 80 mg via INTRAVENOUS

## 2018-05-19 MED ORDER — POTASSIUM CHLORIDE CRYS ER 20 MEQ PO TBCR: 40.0000 meq | EXTENDED_RELEASE_TABLET | Freq: Once | ORAL | Status: DC

## 2018-05-19 NOTE — Progress Notes (Signed)
Patient ID: Scott [name], male    DOB: [birth year], 48 y.o.   MRN: [id number]  HPI  Mr Jaquith is a 48 y/o male with a history of hyperlipidemia, HTN, anxiety, arthritis, depression, klippel-feil syndrome, former tobacco use and chronic heart failure.   Echo report from 02/05/18 reviewed and showed an EF of 55-60%  Admitted 02/04/18 due to acute on chronic HF. Initially needed IV lasix and then transitioned to oral diuretics. Also needed oxygen initially but was then tapered off. Saturations did drop at night so he qualified for nighttime oxygen. Sleep study supposed to be done to rule out sleep apnea. Elevated troponins thought to be due to demand ischemia. Cardiology consult obtained. Discharged after 9 days.   He presents today for a follow-up visit with a chief complaint of moderate shortness of breath upon minimal exertion. He describes this as chronic in nature having been present for several years but has worsened over the last few days. He has associated fatigue, subjective fever, cough, wheezing, palpable chest pain, abdominal distention and slight weight gain along with this. He denies any pedal edema, abdominal pain, difficulty sleeping or dizziness.   Past Medical History:  Diagnosis Date  . Acid reflux   . Anxiety   . Arthritis   . Asthma   . CHF (congestive heart failure) ([place])   . Depression   . Glaucoma   . Hyperlipidemia   . Hypertension   . Klippel-Feil syndrome    Past Surgical History:  Procedure Laterality Date  . COLOSTOMY    . DENVER SHUNT PLACEMENT     Family History  Problem Relation Age of Onset  . Multiple sclerosis Mother   . Colon cancer Father   . Heart disease Father   . Lung cancer Maternal Grandmother   . Thyroid cancer Maternal Grandfather   . Ovarian cancer Paternal Grandmother   . Cancer Paternal Grandfather    Social History   Tobacco Use  . Smoking status: Former Smoker    Packs/day: 1.00    Years: 10.00    Pack years: 10.00    Types:  Cigarettes    Last attempt to quit: 05/2006    Years since quitting: 12.0  . Smokeless tobacco: Never Used  . Tobacco comment: quit 05/2006  Substance Use Topics  . Alcohol use: No    Alcohol/week: 0.0 standard drinks   Allergies  Allergen Reactions  . Codeine Shortness Of Breath   Prior to Admission medications   Medication Sig Start Date End Date Taking? Authorizing Provider  albuterol (PROVENTIL HFA;VENTOLIN HFA) 108 (90 Base) MCG/ACT inhaler Inhale 2 puffs into the lungs every 4 (four) hours as needed for wheezing or shortness of breath. 01/11/18  Yes Chauvin, Herbie [place], PA  ANDROGEL PUMP 20.25 MG/ACT (1.62%) GEL Apply 1 application topically daily.  09/04/14  Yes [provider]  b complex vitamins capsule Take 1 capsule by mouth daily.   Yes [provider]  brinzolamide (AZOPT) 1 % ophthalmic suspension Place 1 drop into both eyes daily.    Yes [provider]  bromocriptine (PARLODEL) 2.5 MG tablet Take 5 mg by mouth daily.  09/10/14  Yes [provider]  diazepam (VALIUM) 2 MG tablet Take 1 tablet by mouth as needed.   Yes [provider]  glucosamine-chondroitin 500-400 MG tablet Take 2 tablets by mouth daily.   Yes [provider]  latanoprost (XALATAN) 0.005 % ophthalmic solution Place 1 drop into both eyes at bedtime.  07/28/07  Yes [provider]  Magnesium 500 MG TABS Take 1 tablet by mouth daily. Patient says he takes magnesium orotate   Yes [provider]  Omega-3 Fatty Acids (OMEGA ESSENTIALS BASIC) LIQD Take 15 mLs by mouth daily.   Yes [provider]  PARoxetine (PAXIL) 40 MG tablet Take 1 tablet (40 mg total) by mouth daily. 05/11/18  Yes [name], [name], PA  potassium chloride SA (K-DUR,KLOR-CON) 20 MEQ tablet Take 1 tablet (20 mEq total) by mouth daily. 04/27/18  Yes Dunn, Ryan M, PA-C  quinapril (ACCUPRIL) 20 MG tablet TAKE 1 TABLET BY MOUTH EVERY DAY 03/03/18  Yes [name], [name], PA   Spacer/Aero-Holding Chambers DEVI 1 Device by Does not apply route 4 (four) times daily. 03/01/18  Yes Dunn, Areta Haber, PA-C  torsemide (DEMADEX) 20 MG tablet Take 3 tablets (60 mg total) by mouth 2 (two) times daily. 05/03/18 08/01/18 Yes Theora Gianotti, NP    Review of Systems  Constitutional: Positive for appetite change (decreased), fatigue and fever.  HENT: Negative for congestion, postnasal drip and sore throat.   Eyes: Negative.   Respiratory: Positive for cough, shortness of breath and wheezing. Negative for chest tightness.   Cardiovascular: Negative for chest pain, palpitations and leg swelling.  Gastrointestinal: Positive for abdominal distention. Negative for abdominal pain.  Endocrine: Negative.   Genitourinary: Negative.   Musculoskeletal: Positive for neck pain (better). Negative for arthralgias ()) and back pain.  Skin: Negative.   Allergic/Immunologic: Negative.   Neurological: Negative for dizziness and light-headedness.  Hematological: Negative for adenopathy. Does not bruise/bleed easily.  Psychiatric/Behavioral: Negative for dysphoric mood and sleep disturbance (sleeping on 1 pillow). The patient is not nervous/anxious.    Vitals:   05/19/18 1105  BP: 131/84  Pulse: 83  Resp: 18  Temp: 98.6 F (37 C)  SpO2: 95%  Weight: 206 lb (93.4 kg)  Height: 5\' 2"  (1.575 m)   Wt Readings from Last 3 Encounters:  05/19/18 206 lb (93.4 kg)  05/10/18 202 lb (91.6 kg)  04/27/18 205 lb (93 kg)   Lab Results  Component Value Date   CREATININE 1.13 05/10/2018   CREATININE 1.12 04/19/2018   CREATININE 1.26 (H) 04/04/2018    Physical Exam Vitals signs and nursing note reviewed.  Constitutional:      Appearance: He is well-developed.  HENT:     Head: Normocephalic and atraumatic.  Neck:     Musculoskeletal: Normal range of motion and neck supple.     Vascular: No JVD.  Cardiovascular:     Rate and Rhythm: Normal rate and regular rhythm.  Pulmonary:      Effort: Pulmonary effort is normal. No respiratory distress.     Breath sounds: Wheezing (slight expiratory throughout all lung fields) present. No rales.  Abdominal:     General: There is distension.     Tenderness: There is no abdominal tenderness.     Comments: tight  Musculoskeletal:        General: No tenderness.     Right lower leg: He exhibits no tenderness. No edema.     Left lower leg: He exhibits no tenderness. No edema.     Comments: Reproducible pain upon palpation over left anterior chest wall  Skin:    General: Skin is warm and dry.  Neurological:     Mental Status: He is alert and oriented to person, place, and time.  Psychiatric:        Behavior: Behavior normal.  Thought Content: Thought content normal.    Assessment & Plan:  1: Acute on Chronic heart failure with preserved ejection fraction- - NYHA class III - fluid overloaded today  - weighing daily; instructed to call for an overnight weight gain of >2 pounds or a weekly weight gain of >5 pounds - weight up 4 pounds from last visit here 1 week ago - will send for 80mg  IV lasix/ 48meq PO potassium - will get BMP/BNP today - [employer] had previously denied metolazone even with PA - not adding salt and has been reading food labels. Reviewed the importance of closely following a 2000mg  sodium diet - patient says that his breathing has worsened since he hasn't had any heat in the home. Has been using space heaters and says that his heat should be turned back on tomorrow.  - saw cardiology (Dunn) 04/27/2018 - has been taking torsemide 80mg  AM/ 60mg  PM - BNP on 02/04/18 was 216.0 - patient does not get the flu vaccine; good handwashing encouraged - PharmD reconciled medications with the patient  2: HTN- - BP looks good today - saw PCP (Chauvin) 01/11/18 - BMP from 05/10/2018 reviewed and showed sodium 140, potassium 3.8, creatinine 1.13 and GFR >60  3: Klippel-Fiel syndrome- - saw pulmonology Patsey Berthold)  01/25/18 - supposed to see pulmonology 05/26/2018 - had PFT's 02/21/18 - wears oxygen at 2L PRN  Medication list was reviewed.  Prior to finishing note, received lab results and LM on patient's voicemail to take an additional 72meq potassium daily for the next 3 days.   Return next week or sooner for any questions/problems before then.

## 2018-05-19 NOTE — Patient Instructions (Signed)
Continue weighing daily and call for an overnight weight gain of > 2 pounds or a weekly weight gain of >5 pounds. 

## 2018-05-24 NOTE — Progress Notes (Deleted)
Patient ID: Scott [name], male    DOB: 1970-12-31, 48 y.o.   MRN: [id number]  HPI  Mr Folmar is a 48 y/o male with a history of hyperlipidemia, HTN, anxiety, arthritis, depression, klippel-feil syndrome, former tobacco use and chronic heart failure.   Echo report from 02/05/18 reviewed and showed an EF of 55-60%  Admitted 02/04/18 due to acute on chronic HF. Initially needed IV lasix and then transitioned to oral diuretics. Also needed oxygen initially but was then tapered off. Saturations did drop at night so he qualified for nighttime oxygen. Sleep study supposed to be done to rule out sleep apnea. Elevated troponins thought to be due to demand ischemia. Cardiology consult obtained. Discharged after 9 days.   He presents today for a follow-up visit with a chief complaint of    Past Medical History:  Diagnosis Date  . Acid reflux   . Anxiety   . Arthritis   . Asthma   . CHF (congestive heart failure) ([place])   . Depression   . Glaucoma   . Hyperlipidemia   . Hypertension   . Klippel-Feil syndrome    Past Surgical History:  Procedure Laterality Date  . COLOSTOMY    . DEN[place] SHUNT PLACEMENT     Family History  Problem Relation Age of Onset  . Multiple sclerosis Mother   . Colon cancer Father   . Heart disease Father   . Lung cancer Maternal Grandmother   . Thyroid cancer Maternal Grandfather   . Ovarian cancer Paternal Grandmother   . Cancer Paternal Grandfather    Social History   Tobacco Use  . Smoking status: Former Smoker    Packs/day: 1.00    Years: 10.00    Pack years: 10.00    Types: Cigarettes    Last attempt to quit: 05/2006    Years since quitting: 12.0  . Smokeless tobacco: Never Used  . Tobacco comment: quit 05/2006  Substance Use Topics  . Alcohol use: No    Alcohol/week: 0.0 standard drinks   Allergies  Allergen Reactions  . Codeine Shortness Of Breath     Review of Systems  Constitutional: Positive for appetite change (decreased), fatigue and  fever.  HENT: Negative for congestion, postnasal drip and sore throat.   Eyes: Negative.   Respiratory: Positive for cough, shortness of breath and wheezing. Negative for chest tightness.   Cardiovascular: Negative for chest pain, palpitations and leg swelling.  Gastrointestinal: Positive for abdominal distention. Negative for abdominal pain.  Endocrine: Negative.   Genitourinary: Negative.   Musculoskeletal: Positive for neck pain (better). Negative for arthralgias ()) and back pain.  Skin: Negative.   Allergic/Immunologic: Negative.   Neurological: Negative for dizziness and light-headedness.  Hematological: Negative for adenopathy. Does not bruise/bleed easily.  Psychiatric/Behavioral: Negative for dysphoric mood and sleep disturbance (sleeping on 1 pillow). The patient is not nervous/anxious.      Physical Exam Vitals signs and nursing note reviewed.  Constitutional:      Appearance: He is well-developed.  HENT:     Head: Normocephalic and atraumatic.  Neck:     Musculoskeletal: Normal range of motion and neck supple.     Vascular: No JVD.  Cardiovascular:     Rate and Rhythm: Normal rate and regular rhythm.  Pulmonary:     Effort: Pulmonary effort is normal. No respiratory distress.     Breath sounds: Wheezing (slight expiratory throughout all lung fields) present. No rales.  Abdominal:     General: There is  distension.     Tenderness: There is no abdominal tenderness.     Comments: tight  Musculoskeletal:        General: No tenderness.     Right lower leg: He exhibits no tenderness. No edema.     Left lower leg: He exhibits no tenderness. No edema.     Comments: Reproducible pain upon palpation over left anterior chest wall  Skin:    General: Skin is warm and dry.  Neurological:     Mental Status: He is alert and oriented to person, place, and time.  Psychiatric:        Behavior: Behavior normal.        Thought Content: Thought content normal.    Assessment &  Plan:  1: Acute on Chronic heart failure with preserved ejection fraction- - NYHA class III - fluid overloaded today  - weighing daily; instructed to call for an overnight weight gain of >2 pounds or a weekly weight gain of >5 pounds - weight  - - [employer] had previously denied metolazone even with PA - not adding salt and has been reading food labels. Reviewed the importance of closely following a 2000mg  sodium diet - patient says that his breathing has worsened since he hasn't had any heat in the home. Has been using space heaters and says that his heat should be turned back on tomorrow.  - saw cardiology (Dunn) 04/27/2018 - has been taking torsemide 80mg  AM/ 60mg  PM - BNP on 02/04/18 was 216.0 - patient does not get the flu vaccine; good handwashing encouraged - PharmD reconciled medications with the patient  2: HTN- - BP  - saw PCP (Chauvin) 01/11/18 - BMP from 05/10/2018 reviewed and showed sodium 140, potassium 3.8, creatinine 1.13 and GFR >60  3: Klippel-Fiel syndrome- - saw pulmonology Patsey Berthold) 01/25/18 - supposed to see pulmonology 05/26/2018 - had PFT's 02/21/18 - wears oxygen at 2L PRN  Medication list was reviewed.  Prior to finishing note, received lab results and LM on patient's voicemail to take an additional 41meq potassium daily for the next 3 days.

## 2018-05-25 ENCOUNTER — Telehealth: Payer: Self-pay | Admitting: Family

## 2018-05-25 ENCOUNTER — Ambulatory Visit: Payer: Medicare Other | Admitting: Family

## 2018-05-25 NOTE — Progress Notes (Deleted)
Cardiology Office Note Date:  05/25/2018  Patient ID:  Scott, [name] 26-Mar-1971, MRN [id number] PCP:  Margo Common, PA  Cardiologist:  Dr. Fletcher Anon, MD  ***refresh   Chief Complaint: Follow up  History of Present Illness: Scott [name] is a 48 y.o. male with history of congenital abnormality (Klippel-File syndrome)with restrictive lung disease and airway hyperactivity, diastolic CHF, hypertension, hyperlipidemia, asthma, alopecia, migraine disorder, open-angle glaucoma, depression,arthritis, anxiety, and GERD who presents for follow up of diastolic CHF.  Patient was admitted to the hospital on 02/05/18 with a several week history of progressive shortness of breath, edema, orthopnea, and PND. Prior to this admission he had no previously known cardiac history. Chest x-ray showed cardiomegaly with mild pulmonary vascular congestion. BNP was noted to be 216.Troponin peaked at 0.06 and was felt to be demand ischemia in the setting of poorly controlled hypertension and volume overload.Symptoms improved with IV diuresis. Echo showed an EF of 55 to 60%, normal wall motion, grade 1 diastolic dysfunction, normal sizedleft atrium, RV systolic function normal, unable to accurately estimate PASP. Documented discharge weight 85 kg.  Outpatient PFTs on 02/21/2018 showed severe restrictive lung disease with positive bronchodilator response which was felt to possibly reflect underlying reactive airway disease. In follow-up on 03/01/2018 he was doing well.  His weight remained stable with a documented office weight of 85.7 kg.  He had reported being followed by the [facility] CHF clinic with intermittently needing IV Lasix for diuresis as an outpatient.  He was most recently seen by me on 04/27/2018 noting increased shortness of breath and weight gain with a documented weight of 93 kg.  This appeared to have been in the setting of the patient running out of torsemide.  He was diuresed as an outpatient  with resumption of torsemide.  He was most recently seen by the [facility] CHF clinic on 05/19/2018 and noted to still be volume overloaded with a documented weight of 93.4 kg.  He was given 80 of IV Lasix with 40 mEq of p.o. potassium.  Labs checked at that time showed a BNP of 26 and a potassium of 3.3 with an uptrending serum creatinine from 1.13 to 1.37.  It appears he did not show for his follow-up with the [facility] CHF clinic on 05/25/2018.  ***   Past Medical History:  Diagnosis Date  . Acid reflux   . Anxiety   . Arthritis   . Asthma   . CHF (congestive heart failure) ([place])   . Depression   . Glaucoma   . Hyperlipidemia   . Hypertension   . Klippel-Feil syndrome     Past Surgical History:  Procedure Laterality Date  . COLOSTOMY    . DENVER SHUNT PLACEMENT      No outpatient medications have been marked as taking for the 05/29/18 encounter (Appointment) with Rise Mu, PA-C.    Allergies:   Codeine   Social History:  The patient  reports that he quit smoking about 12 years ago. His smoking use included cigarettes. He has a 10.00 pack-year smoking history. He has never used smokeless tobacco. He reports that he does not drink alcohol or use drugs.   Family History:  The patient's family history includes Cancer in his paternal grandfather; Colon cancer in his father; Heart disease in his father; Lung cancer in his maternal grandmother; Multiple sclerosis in his mother; Ovarian cancer in his paternal grandmother; Thyroid cancer in his maternal grandfather.  ROS:   ROS  PHYSICAL EXAM: *** VS:  There were no vitals taken for this visit. BMI: There is no height or weight on file to calculate BMI.  Physical Exam   EKG:  Was ordered and interpreted by me today. Shows ***  Recent Labs: 02/04/2018: ALT 52; TSH 2.826 02/13/2018: Magnesium 2.4 02/20/2018: Hemoglobin 16.6; Platelets 355 05/19/2018: B Natriuretic Peptide 26.0; BUN 24; Creatinine, Ser 1.37; Potassium 3.3;  Sodium 137  02/07/2018: Cholesterol 155; HDL 55; LDL Cholesterol 85; Total CHOL/HDL Ratio 2.8; Triglycerides 74; VLDL 15   Estimated Creatinine Clearance: 66.1 mL/min (A) (by C-G formula based on SCr of 1.37 mg/dL (H)).   Wt Readings from Last 3 Encounters:  05/19/18 206 lb (93.4 kg)  05/10/18 202 lb (91.6 kg)  04/27/18 205 lb (93 kg)     Other studies reviewed: Additional studies/records reviewed today include: summarized above  ASSESSMENT AND PLAN:  1. ***  Disposition: F/u with Dr. Fletcher Anon or an APP in ***  Current medicines are reviewed at length with the patient today.  The patient did not have any concerns regarding medicines.  Signed, Christell Faith, PA-C 05/25/2018 11:54 AM     [facility] [address] [place], [place] [address] [phone]

## 2018-05-25 NOTE — Telephone Encounter (Signed)
Patient did not show for his Heart Failure Clinic appointment on 05/25/2018. Will attempt to reschedule.

## 2018-05-29 ENCOUNTER — Ambulatory Visit: Payer: Medicare Other | Admitting: Physician Assistant

## 2018-05-30 ENCOUNTER — Encounter: Payer: Self-pay | Admitting: Cardiovascular Disease

## 2018-06-01 ENCOUNTER — Ambulatory Visit (INDEPENDENT_AMBULATORY_CARE_PROVIDER_SITE_OTHER): Payer: Medicare Other | Admitting: Family Medicine

## 2018-06-01 ENCOUNTER — Encounter: Payer: Self-pay | Admitting: Family Medicine

## 2018-06-01 VITALS — BP 118/56 | HR 100 | Temp 98.1°F | Wt 200.6 lb

## 2018-06-01 DIAGNOSIS — J069 Acute upper respiratory infection, unspecified: Secondary | ICD-10-CM

## 2018-06-01 DIAGNOSIS — J0110 Acute frontal sinusitis, unspecified: Secondary | ICD-10-CM | POA: Diagnosis not present

## 2018-06-01 MED ORDER — AMOXICILLIN 875 MG PO TABS: 875.0000 mg | ORAL_TABLET | Freq: Two times a day (BID) | ORAL | 0 refills | Status: DC

## 2018-06-01 MED ORDER — DM-GUAIFENESIN ER 30-600 MG PO TB12: 1.0000 | ORAL_TABLET | Freq: Two times a day (BID) | ORAL | 0 refills | Status: DC

## 2018-06-01 NOTE — Progress Notes (Signed)
Patient: Scott [name] Male    DOB: 18-Dec-1970   48 y.o.   MRN: [id number] Visit Date: 06/01/2018  Today's Provider: [name], PA   No chief complaint on file.  Subjective:     Cough  This is a new problem. The current episode started in the past 7 days. Associated symptoms include headaches, nasal congestion, a sore throat, shortness of breath and wheezing. Pertinent negatives include no chest pain, chills, ear congestion, ear pain, fever, heartburn, hemoptysis, myalgias, postnasal drip, rash, rhinorrhea, sweats or weight loss. Nothing aggravates the symptoms. Treatments tried: Coricidin. The treatment provided no relief.   Patient has had cough and chest congestion for over one week. Cough is productive. Patient also has symptoms of headache, nasal congestion, shortness of breath, wheezing, and sore throat. Patient has been taking otc Coricidin with no relief.   Past Medical History:  Diagnosis Date  . Acid reflux   . Anxiety   . Arthritis   . Asthma   . CHF (congestive heart failure) ([place])   . Depression   . Glaucoma   . Hyperlipidemia   . Hypertension   . Klippel-Feil syndrome    Past Surgical History:  Procedure Laterality Date  . COLOSTOMY    . DENVER SHUNT PLACEMENT     Family History  Problem Relation Age of Onset  . Multiple sclerosis Mother   . Colon cancer Father   . Heart disease Father   . Lung cancer Maternal Grandmother   . Thyroid cancer Maternal Grandfather   . Ovarian cancer Paternal Grandmother   . Cancer Paternal Grandfather    Allergies  Allergen Reactions  . Codeine Shortness Of Breath    Current Outpatient Medications:  .  albuterol (PROVENTIL HFA;VENTOLIN HFA) 108 (90 Base) MCG/ACT inhaler, Inhale 2 puffs into the lungs every 4 (four) hours as needed for wheezing or shortness of breath., Disp: 1 Inhaler, Rfl: 2 .  ANDROGEL PUMP 20.25 MG/ACT (1.62%) GEL, Apply 1 application topically daily. , Disp: , Rfl:  .  b complex vitamins  capsule, Take 1 capsule by mouth daily., Disp: , Rfl:  .  brinzolamide (AZOPT) 1 % ophthalmic suspension, Place 1 drop into both eyes daily. , Disp: , Rfl:  .  bromocriptine (PARLODEL) 2.5 MG tablet, Take 5 mg by mouth daily. , Disp: , Rfl: 0 .  diazepam (VALIUM) 2 MG tablet, Take 1 tablet by mouth as needed., Disp: , Rfl:  .  glucosamine-chondroitin 500-400 MG tablet, Take 2 tablets by mouth daily., Disp: , Rfl:  .  latanoprost (XALATAN) 0.005 % ophthalmic solution, Place 1 drop into both eyes at bedtime. , Disp: , Rfl:  .  Magnesium 500 MG TABS, Take 1 tablet by mouth daily. Patient says he takes magnesium orotate, Disp: , Rfl:  .  Omega-3 Fatty Acids (OMEGA ESSENTIALS BASIC) LIQD, Take 15 mLs by mouth daily., Disp: , Rfl:  .  PARoxetine (PAXIL) 40 MG tablet, Take 1 tablet (40 mg total) by mouth daily., Disp: 30 tablet, Rfl: 11 .  potassium chloride SA (K-DUR,KLOR-CON) 20 MEQ tablet, Take 1 tablet (20 mEq total) by mouth daily., Disp: 90 tablet, Rfl: 3 .  quinapril (ACCUPRIL) 20 MG tablet, TAKE 1 TABLET BY MOUTH EVERY DAY, Disp: 90 tablet, Rfl: 3 .  Spacer/Aero-Holding Chambers DEVI, 1 Device by Does not apply route 4 (four) times daily., Disp: 1 each, Rfl: 0 .  torsemide (DEMADEX) 20 MG tablet, Take 3 tablets (60 mg total) by  mouth 2 (two) times daily., Disp: 180 tablet, Rfl: 3  Review of Systems  Constitutional: Negative for appetite change, chills, fever and weight loss.  HENT: Positive for congestion and sore throat. Negative for ear pain, postnasal drip and rhinorrhea.   Respiratory: Positive for cough, chest tightness, shortness of breath and wheezing. Negative for hemoptysis.   Cardiovascular: Negative for chest pain and palpitations.  Gastrointestinal: Negative for abdominal pain, heartburn, nausea and vomiting.  Musculoskeletal: Negative for myalgias.  Skin: Negative for rash.  Neurological: Positive for headaches.   Social History   Tobacco Use  . Smoking status: Former Smoker      Packs/day: 1.00    Years: 10.00    Pack years: 10.00    Types: Cigarettes    Last attempt to quit: 05/2006    Years since quitting: 12.0  . Smokeless tobacco: Never Used  . Tobacco comment: quit 05/2006  Substance Use Topics  . Alcohol use: No    Alcohol/week: 0.0 standard drinks     Objective:   BP (!) 118/56 (BP Location: Right Arm, Patient Position: Sitting, Cuff Size: Large)   Pulse 100   Temp 98.1 F (36.7 C) (Oral)   Wt 200 lb 9.6 oz (91 kg)   SpO2 94%   BMI 36.69 kg/m    Wt Readings from Last 3 Encounters:  06/01/18 200 lb 9.6 oz (91 kg)  05/19/18 206 lb (93.4 kg)  05/10/18 202 lb (91.6 kg)   BP Readings from Last 3 Encounters:  06/01/18 (!) 118/56  05/19/18 131/84  05/10/18 134/81   Vitals:   06/01/18 1511  BP: (!) 118/56  Pulse: 100  Temp: 98.1 F (36.7 C)  TempSrc: Oral  SpO2: 94%  Weight: 200 lb 9.6 oz (91 kg)   Physical Exam Constitutional:      General: He is not in acute distress.    Appearance: He is well-developed.  HENT:     Head: Normocephalic and atraumatic.     Right Ear: Hearing and tympanic membrane normal.     Left Ear: Hearing and tympanic membrane normal.     Nose: Congestion present.     Comments: Reddened turbinates.    Mouth/Throat:     Pharynx: Oropharynx is clear.  Eyes:     General: Lids are normal. No scleral icterus.       Right eye: No discharge.        Left eye: No discharge.     Conjunctiva/sclera: Conjunctivae normal.  Pulmonary:     Effort: Pulmonary effort is normal. No respiratory distress.     Breath sounds: No wheezing, rhonchi or rales.  Abdominal:     General: Bowel sounds are normal.     Palpations: Abdomen is soft.  Musculoskeletal: Normal range of motion.  Skin:    Findings: No lesion or rash.  Neurological:     Mental Status: He is alert and oriented to person, place, and time.  Psychiatric:        Speech: Speech normal.        Behavior: Behavior normal.        Thought Content: Thought content  normal.       Assessment & Plan    1. URI with cough and congestion Onset over the past week with low grade fever (99.9), green sputum, sore throat and sinus pressure. Increase fluid intake and may use Tylenol prn aches , pains or fever. Given Mucinex-DM for cough and congestion. Recheck prn.  - dextromethorphan-guaiFENesin ([employer] DM) 30-600  MG 12hr tablet; Take 1 tablet by mouth 2 (two) times daily.  Dispense: 20 tablet; Refill: 0  2. Subacute frontal sinusitis Some frontal tenderness to palpation and PND with cough and sore throat over the past week. Difficult to assess due to congenital skeletal anomalies. Treat with Amoxil and increase fluid intake. Recheck if no better in a week. - amoxicillin (AMOXIL) 875 MG tablet; Take 1 tablet (875 mg total) by mouth 2 (two) times daily.  Dispense: 20 tablet; Refill: [place], PA  [facility]

## 2018-06-05 NOTE — Progress Notes (Signed)
Cardiology Office Note   Date:  06/06/2018   ID:  Scott [name], DOB [birth year], MRN [id number]  PCP:  Margo Common, PA  Cardiologist:  Kathlyn Sacramento, MD  Chief Complaint  Patient presents with  . other    3 month follow up. Meds reviewed by the pt. verbally. Pt. c/o shortness of breath and chest pain.      History of Present Illness: Scott [name] is a 48 y.o. male with history of congenital abnormality (Klippel-File syndrome) with restrictive lung disease and airway hyperactivity, diastolic CHF, hypertension, hyperlipidemia, asthma, alopecia, migraine disorder, open-angle glaucoma, depression, arthritis, anxiety, and GERD.  On 02/04/2018 he was seen in the ED for SOB and later diagnosed with CHF.  BNP was 216.  Troponin was 0.06 felt to be demand ischemia  In the setting of e uncontrolled hypertension . Echo on 02/05/2018 showed an LV EF: 55% -   60%. Stress test on 02/09/2018 showed no evidence of ischemia.Marland Kitchen He is being followed by Darylene Price, FNP at [facility] since 02/16/2018. Sleep study on 03/10/2018, was negative for OSA. Pulmonary function testing showed severe restrictive lung disease.  The patient is doing well today. Accompanied by his caregiver. He has been taking torsemide 80 mg twice daily recently. It has improved his shortness of breath, but this may affected his kidneys. His breathing has improved. He is eating less due to his stomach feeling swollen. His normal weight was around 185 a year ago. He has an upcoming appointment with Dr. Patsey Berthold soon to get care for his lungs.  The patient denies chest pain, SOB, palpitations, dizziness or any other related symptoms or complaints at this time.    Past Medical History:  Diagnosis Date  . Acid reflux   . Anxiety   . Arthritis   . Asthma   . CHF (congestive heart failure) ([place])   . Depression   . Glaucoma   . Hyperlipidemia   . Hypertension   . Klippel-Feil syndrome     Past Surgical  History:  Procedure Laterality Date  . COLOSTOMY    . DENVER SHUNT PLACEMENT       Current Outpatient Medications  Medication Sig Dispense Refill  . albuterol (PROVENTIL HFA;VENTOLIN HFA) 108 (90 Base) MCG/ACT inhaler Inhale 2 puffs into the lungs every 4 (four) hours as needed for wheezing or shortness of breath. 1 Inhaler 2  . amoxicillin (AMOXIL) 875 MG tablet Take 1 tablet (875 mg total) by mouth 2 (two) times daily. 20 tablet 0  . ANDROGEL PUMP 20.25 MG/ACT (1.62%) GEL Apply 1 application topically daily.     Marland Kitchen b complex vitamins capsule Take 1 capsule by mouth daily.    . brinzolamide (AZOPT) 1 % ophthalmic suspension Place 1 drop into both eyes daily.     . bromocriptine (PARLODEL) 2.5 MG tablet Take 5 mg by mouth daily.   0  . dextromethorphan-guaiFENesin (MUCINEX DM) 30-600 MG 12hr tablet Take 1 tablet by mouth 2 (two) times daily. 20 tablet 0  . diazepam (VALIUM) 2 MG tablet Take 1 tablet by mouth as needed.    Marland Kitchen glucosamine-chondroitin 500-400 MG tablet Take 2 tablets by mouth daily.    Marland Kitchen latanoprost (XALATAN) 0.005 % ophthalmic solution Place 1 drop into both eyes at bedtime.     . Magnesium 500 MG TABS Take 1 tablet by mouth daily. Patient says he takes magnesium orotate    . Omega-3 Fatty Acids (OMEGA ESSENTIALS BASIC) LIQD Take  15 mLs by mouth daily.    Marland Kitchen PARoxetine (PAXIL) 40 MG tablet Take 1 tablet (40 mg total) by mouth daily. 30 tablet 11  . potassium chloride SA (K-DUR,KLOR-CON) 20 MEQ tablet Take 1 tablet (20 mEq total) by mouth daily. (Patient taking differently: Take 20 mEq by mouth 2 (two) times daily. ) 90 tablet 3  . quinapril (ACCUPRIL) 20 MG tablet TAKE 1 TABLET BY MOUTH EVERY DAY 90 tablet 3  . Spacer/Aero-Holding Chambers DEVI 1 Device by Does not apply route 4 (four) times daily. 1 each 0  . torsemide (DEMADEX) 20 MG tablet Take 3 tablets (60 mg total) by mouth 2 (two) times daily. (Patient taking differently: Take 80 mg by mouth 2 (two) times daily. ) 180  tablet 3   No current facility-administered medications for this visit.     Allergies:   Codeine    Social History:  The patient  reports that he quit smoking about 12 years ago. His smoking use included cigarettes. He has a 10.00 pack-year smoking history. He has never used smokeless tobacco. He reports that he does not drink alcohol or use drugs.   Family History:  The patient's family history includes Cancer in his paternal grandfather; Colon cancer in his father; Heart disease in his father; Lung cancer in his maternal grandmother; Multiple sclerosis in his mother; Ovarian cancer in his paternal grandmother; Thyroid cancer in his maternal grandfather.    ROS:  Please see the history of present illness.   Otherwise, review of systems are positive for none.   All other systems are reviewed and negative.    PHYSICAL EXAM: VS:  BP 125/87 (BP Location: Left Arm, Patient Position: Sitting, Cuff Size: Normal)   Pulse 93   Ht 5\' 2"  (1.575 m)   Wt 198 lb 8 oz (90 kg)   BMI 36.31 kg/m  , BMI Body mass index is 36.31 kg/m. GEN: Well nourished, well developed, in no acute distress  HEENT: normal  Neck: no JVD, carotid bruits, or masses Cardiac: RRR; no murmurs, rubs, or gallops,no edema  Respiratory:  clear to auscultation bilaterally, normal work of breathing GI: soft, nontender, nondistended, + BS MS: no deformity or atrophy  Skin: warm and dry, no rash Neuro:  Strength and sensation are intact Psych: euthymic mood, full affect   EKG:  EKG is ordered today. The ekg ordered today demonstrates normal sinus rhythm with LVH.   Recent Labs: 02/04/2018: ALT 52; TSH 2.826 02/13/2018: Magnesium 2.4 02/20/2018: Hemoglobin 16.6; Platelets 355 05/19/2018: B Natriuretic Peptide 26.0; BUN 24; Creatinine, Ser 1.37; Potassium 3.3; Sodium 137    Lipid Panel    Component Value Date/Time   CHOL 155 02/07/2018 0508   TRIG 74 02/07/2018 0508   HDL 55 02/07/2018 0508   CHOLHDL 2.8 02/07/2018  0508   VLDL 15 02/07/2018 0508   LDLCALC 85 02/07/2018 0508      Wt Readings from Last 3 Encounters:  06/06/18 198 lb 8 oz (90 kg)  06/01/18 200 lb 9.6 oz (91 kg)  05/19/18 206 lb (93.4 kg)        ASSESSMENT AND PLAN:  1. Chronic diastolic heart failure: The patient has been taking 80 mg of torsemide twice daily.  I am actually concerned that he is volume depleted.  Labs 2 weeks ago showed evidence of volume depletion with normal BNP.  I decrease torsemide to 40 mg twice daily and discussed with him the importance of low-sodium diet.  I am going to  check basic metabolic profile today.  2. Restrictive airway disease: The patient is supposed to see Dr. Patsey Berthold in the near future.  3. Hypertension: Blood pressure is reasonably controlled today.  Continue quinapril.  4. Hyperlipidemia: LDL of 85 during admission in 02/2018.  Not currently on a statin.  Healthy lifestyle recommended.     Disposition:   FU in 3 months  I, Jesus Reyes am acting as a Education administrator for Kathlyn Sacramento, M.D.  I have reviewed the above documentation for accuracy and completeness, and I agree with the above.   Signed, Kathlyn Sacramento, MD 06/06/18 [place], [place]

## 2018-06-06 ENCOUNTER — Ambulatory Visit (INDEPENDENT_AMBULATORY_CARE_PROVIDER_SITE_OTHER): Payer: Medicare Other | Admitting: Cardiovascular Disease

## 2018-06-06 ENCOUNTER — Encounter: Payer: Self-pay | Admitting: Cardiovascular Disease

## 2018-06-06 VITALS — BP 125/87 | HR 93 | Ht 62.0 in | Wt 198.5 lb

## 2018-06-06 DIAGNOSIS — E785 Hyperlipidemia, unspecified: Secondary | ICD-10-CM

## 2018-06-06 DIAGNOSIS — J984 Other disorders of lung: Secondary | ICD-10-CM

## 2018-06-06 DIAGNOSIS — I5032 Chronic diastolic (congestive) heart failure: Secondary | ICD-10-CM | POA: Diagnosis not present

## 2018-06-06 DIAGNOSIS — I10 Essential (primary) hypertension: Secondary | ICD-10-CM | POA: Diagnosis not present

## 2018-06-06 MED ORDER — TORSEMIDE 20 MG PO TABS: 40.0000 mg | ORAL_TABLET | Freq: Two times a day (BID) | ORAL | 3 refills | Status: DC

## 2018-06-06 NOTE — Patient Instructions (Signed)
Medication Instructions:  DECREASE the Torsemide to 40 mg twice daily  If you need a refill on your cardiac medications before your next appointment, please call your pharmacy.   Lab work: Your provider would like for you to have the following labs today: BMET  If you have labs (blood work) drawn today and your tests are completely normal, you will receive your results only by: Marland Kitchen MyChart Message (if you have MyChart) OR . A paper copy in the mail If you have any lab test that is abnormal or we need to change your treatment, we will call you to review the results.  Testing/Procedures: None ordered  Follow-Up: At [facility], you and your health needs are our priority.  As part of our continuing mission to provide you with exceptional heart care, we have created designated Provider Care Teams.  These Care Teams include your primary Cardiologist (physician) and Advanced Practice Providers (APPs -  Physician Assistants and Nurse Practitioners) who all work together to provide you with the care you need, when you need it. . Follow up with Christell Faith, PA in 3 months

## 2018-06-07 ENCOUNTER — Telehealth: Payer: Self-pay | Admitting: Pulmonary Disease

## 2018-06-07 ENCOUNTER — Other Ambulatory Visit: Payer: Medicare Other

## 2018-06-07 NOTE — Telephone Encounter (Signed)
Spoke to [place], letter printed, awaiting Dr. Patsey Berthold signature. Once complete, will send to [facility].

## 2018-06-07 NOTE — Telephone Encounter (Signed)
Re-emailed to [employer].

## 2018-06-08 ENCOUNTER — Telehealth: Payer: Self-pay | Admitting: Pulmonary Disease

## 2018-06-08 NOTE — Telephone Encounter (Signed)
Patient dismissed from [facility] by Dr. Vita Barley L, effective 06/02/2018. Dismissal letter was sent by registered mail. js.

## 2018-06-20 ENCOUNTER — Other Ambulatory Visit: Payer: Self-pay

## 2018-06-20 ENCOUNTER — Encounter: Payer: Self-pay | Admitting: Family Medicine

## 2018-06-20 ENCOUNTER — Ambulatory Visit (INDEPENDENT_AMBULATORY_CARE_PROVIDER_SITE_OTHER): Payer: Medicare Other | Admitting: Family Medicine

## 2018-06-20 VITALS — BP 94/60 | HR 89 | Temp 98.4°F | Resp 16 | Wt 206.0 lb

## 2018-06-20 DIAGNOSIS — R300 Dysuria: Secondary | ICD-10-CM

## 2018-06-20 DIAGNOSIS — I5032 Chronic diastolic (congestive) heart failure: Secondary | ICD-10-CM

## 2018-06-20 LAB — POCT URINALYSIS DIPSTICK
Appearance: ABNORMAL
Bilirubin, UA: NEGATIVE
GLUCOSE UA: NEGATIVE
Nitrite, UA: NEGATIVE
Odor: NORMAL
Protein, UA: POSITIVE — AB
Spec Grav, UA: <=1.005 — AB (ref 1.010–1.025)
Urobilinogen, UA: 1 E.U./dL
pH, UA: 8.5 — AB (ref 5.0–8.0)

## 2018-06-20 MED ORDER — SULFAMETHOXAZOLE-TRIMETHOPRIM 800-160 MG PO TABS: 1.0000 | ORAL_TABLET | Freq: Two times a day (BID) | ORAL | 0 refills | Status: DC

## 2018-06-20 NOTE — Progress Notes (Signed)
Patient ID: Scott [name], male    DOB: 11-20-1970, 48 y.o.   MRN: [id number]  HPI  [name] is a 48 y/o male with a history of hyperlipidemia, HTN, anxiety, arthritis, depression, klippel-feil syndrome, former tobacco use and chronic heart failure.   Echo report from 02/05/18 reviewed and showed an EF of 55-60%  Admitted 02/04/18 due to acute on chronic HF. Initially needed IV lasix and then transitioned to oral diuretics. Also needed oxygen initially but was then tapered off. Saturations did drop at night so he qualified for nighttime oxygen. Sleep study supposed to be done to rule out sleep apnea. Elevated troponins thought to be due to demand ischemia. Cardiology consult obtained. Discharged after 9 days.   He presents today for a follow-up visit with a chief complaint of moderate shortness of breath upon minimal exertion. He describes this as chronic in nature having been present for several years. He has associated fatigue, wheezing, abdominal distention (improving), dysuria (on antibiotics) and neck pain along with this. He denies any difficulty sleeping, dizziness, palpitations, pedal edema, chest pain, cough, fever or weight gain gain. Is feeling nauseous but took his antibiotic on an empty stomach.   Past Medical History:  Diagnosis Date  . Acid reflux   . Anxiety   . Arthritis   . Asthma   . CHF (congestive heart failure) ([place])   . Depression   . Glaucoma   . Hyperlipidemia   . Hypertension   . Klippel-Feil syndrome    Past Surgical History:  Procedure Laterality Date  . COLOSTOMY    . DENVER SHUNT PLACEMENT     Family History  Problem Relation Age of Onset  . Multiple sclerosis Mother   . Colon cancer Father   . Heart disease Father   . Lung cancer Maternal Grandmother   . Thyroid cancer Maternal Grandfather   . Ovarian cancer Paternal Grandmother   . Cancer Paternal Grandfather    Social History   Tobacco Use  . Smoking status: Former Smoker    Packs/day: 1.00   Years: 10.00    Pack years: 10.00    Types: Cigarettes    Last attempt to quit: 05/2006    Years since quitting: 12.1  . Smokeless tobacco: Never Used  . Tobacco comment: quit 05/2006  Substance Use Topics  . Alcohol use: No    Alcohol/week: 0.0 standard drinks   Allergies  Allergen Reactions  . Codeine Shortness Of Breath   Prior to Admission medications   Medication Sig Start Date End Date Taking? Authorizing Provider  albuterol (PROVENTIL HFA;VENTOLIN HFA) 108 (90 Base) MCG/ACT inhaler Inhale 2 puffs into the lungs every 4 (four) hours as needed for wheezing or shortness of breath. 01/11/18  Yes Chauvin, Herbie [place], PA  ANDROGEL PUMP 20.25 MG/ACT (1.62%) GEL Apply 1 application topically daily.  09/04/14  Yes [provider]  b complex vitamins capsule Take 1 capsule by mouth daily.   Yes [provider]  brinzolamide (AZOPT) 1 % ophthalmic suspension Place 1 drop into both eyes daily.    Yes [provider]  bromocriptine (PARLODEL) 2.5 MG tablet Take 5 mg by mouth daily.  09/10/14  Yes [provider]  diazepam (VALIUM) 2 MG tablet Take 1 tablet by mouth as needed.   Yes [provider]  glucosamine-chondroitin 500-400 MG tablet Take 2 tablets by mouth daily.   Yes [provider]  latanoprost (XALATAN) 0.005 % ophthalmic solution Place 1 drop into both eyes at  bedtime.  07/28/07  Yes [provider]  Magnesium 500 MG TABS Take 1 tablet by mouth daily. Patient says he takes magnesium orotate   Yes [provider]  Omega-3 Fatty Acids (OMEGA ESSENTIALS BASIC) LIQD Take 15 mLs by mouth daily.   Yes [provider]  PARoxetine (PAXIL) 40 MG tablet Take 1 tablet (40 mg total) by mouth daily. 05/11/18  Yes [name], [name], PA  potassium chloride SA (K-DUR,KLOR-CON) 20 MEQ tablet Take 1 tablet (20 mEq total) by mouth daily. Patient taking differently: Take 20 mEq by mouth 2 (two) times daily.  04/27/18  Yes Dunn, Ryan  M, PA-C  quinapril (ACCUPRIL) 20 MG tablet TAKE 1 TABLET BY MOUTH EVERY DAY 03/03/18  Yes [name], [name], PA  Spacer/Aero-Holding Chambers DEVI 1 Device by Does not apply route 4 (four) times daily. 03/01/18  Yes Dunn, Areta Haber, PA-C  sulfamethoxazole-trimethoprim (BACTRIM DS,SEPTRA DS) 800-160 MG tablet Take 1 tablet by mouth 2 (two) times daily. 06/20/18  Yes [name], [name], PA  torsemide (DEMADEX) 20 MG tablet Take 2 tablets (40 mg total) by mouth 2 (two) times daily. 06/06/18 09/04/18 Yes Wellington Hampshire, MD    Review of Systems  Constitutional: Positive for appetite change (decreased) and fatigue. Negative for fever.  HENT: Negative for congestion, postnasal drip and sore throat.   Eyes: Negative.   Respiratory: Positive for shortness of breath and wheezing. Negative for cough and chest tightness.   Cardiovascular: Negative for chest pain, palpitations and leg swelling.  Gastrointestinal: Positive for abdominal distention and nausea. Negative for abdominal pain.  Endocrine: Negative.   Genitourinary: Positive for dysuria. Negative for hematuria.  Musculoskeletal: Positive for neck pain (better). Negative for arthralgias ()) and back pain.  Skin: Negative.   Allergic/Immunologic: Negative.   Neurological: Negative for dizziness and light-headedness.  Hematological: Negative for adenopathy. Does not bruise/bleed easily.  Psychiatric/Behavioral: Negative for dysphoric mood and sleep disturbance (sleeping on 1 pillow). The patient is not nervous/anxious.    Vitals:   06/21/18 1109  BP: (!) 146/86  Pulse: (!) 107  Resp: 18  SpO2: 93%  Weight: 203 lb 8 oz (92.3 kg)  Height: 5\' 2"  (1.575 m)   Wt Readings from Last 3 Encounters:  06/21/18 203 lb 8 oz (92.3 kg)  06/20/18 206 lb (93.4 kg)  06/06/18 198 lb 8 oz (90 kg)   Lab Results  Component Value Date   CREATININE 1.37 (H) 05/19/2018   CREATININE 1.13 05/10/2018   CREATININE 1.12 04/19/2018    Physical Exam Vitals signs  and nursing note reviewed.  Constitutional:      Appearance: He is well-developed.  HENT:     Head: Normocephalic and atraumatic.  Neck:     Musculoskeletal: Normal range of motion and neck supple.     Vascular: No JVD.  Cardiovascular:     Rate and Rhythm: Regular rhythm. Tachycardia present.  Pulmonary:     Effort: Pulmonary effort is normal. No respiratory distress.     Breath sounds: No wheezing or rales.  Abdominal:     General: There is distension.     Tenderness: There is no abdominal tenderness.  Musculoskeletal:        General: No tenderness.     Right lower leg: He exhibits no tenderness. No edema.     Left lower leg: He exhibits no tenderness. No edema.  Skin:    General: Skin is warm and dry.  Neurological:     Mental Status: He  is alert and oriented to person, place, and time.  Psychiatric:        Behavior: Behavior normal.        Thought Content: Thought content normal.    Assessment & Plan:  1: Chronic heart failure with preserved ejection fraction- - NYHA class III - euvolemic today  - weighing daily; reminded to call for an overnight weight gain of >2 pounds or a weekly weight gain of >5 pounds - weight down ~ 3 pounds from last visit here 1 month ago - patient didn't take torsemide for an entire day due to dysuria but has started it back as of yesterday (has not taken it yet today) - [employer] had previously denied metolazone even with PA - not adding salt and has been reading food labels. Reviewed the importance of closely following a 2000mg  sodium diet - saw cardiology Fletcher Anon) 06/06/2018 - BNP on 05/19/2018 was 26.0 - will get BMP today to evaluate renal function - patient does not get the flu vaccine; good handwashing encouraged - PharmD reconciled medications with the patient  2: HTN- - BP mildly elevated today but currently has a UTI - saw PCP ([name]) 06/20/2018 & PCP restarted torsemide/ potassium - BMP from 05/19/2018 reviewed and showed sodium 137,  potassium 3.3, creatinine 1.37 and GFR >60  3: Klippel-Fiel syndrome- - saw pulmonology Patsey Berthold) 01/25/18 - has been dismissed from [facility] pulmonology - encouraged him to call PCP to get a referral to another pulmonologist Humphrey Rolls or [place]) - had PFT's 02/21/18 - wears oxygen at 2L PRN  Medication list was reviewed.  Return in 3 months or sooner for any questions/problems before then.

## 2018-06-20 NOTE — Progress Notes (Signed)
Patient: Scott [name] Male    DOB: 11-01-1970   48 y.o.   MRN: [id number] Visit Date: 06/20/2018  Today's Provider: [name], PA   Chief Complaint  Patient presents with  . Dysuria   Subjective:     Dysuria   This is a new problem. The current episode started in the past 7 days (5 days). The problem occurs intermittently. The quality of the pain is described as burning and aching. There has been no fever. Associated symptoms include frequency and urgency. Pertinent negatives include no chills, discharge, flank pain, hematuria, hesitancy, nausea, possible pregnancy, sweats or vomiting.   Patient has had burning and pain upon urination for around 5 days. Patient states symptoms increased yesterday. Patient states he drank baking soda and water last which seemed to help symptoms.   Past Medical History:  Diagnosis Date  . Acid reflux   . Anxiety   . Arthritis   . Asthma   . CHF (congestive heart failure) ([place])   . Depression   . Glaucoma   . Hyperlipidemia   . Hypertension   . Klippel-Feil syndrome    Past Surgical History:  Procedure Laterality Date  . COLOSTOMY    . DEN[place] SHUNT PLACEMENT     Family History  Problem Relation Age of Onset  . Multiple sclerosis Mother   . Colon cancer Father   . Heart disease Father   . Lung cancer Maternal Grandmother   . Thyroid cancer Maternal Grandfather   . Ovarian cancer Paternal Grandmother   . Cancer Paternal Grandfather   ' Allergies  Allergen Reactions  . Codeine Shortness Of Breath    Current Outpatient Medications:  .  albuterol (PROVENTIL HFA;VENTOLIN HFA) 108 (90 Base) MCG/ACT inhaler, Inhale 2 puffs into the lungs every 4 (four) hours as needed for wheezing or shortness of breath., Disp: 1 Inhaler, Rfl: 2 .  ANDROGEL PUMP 20.25 MG/ACT (1.62%) GEL, Apply 1 application topically daily. , Disp: , Rfl:  .  b complex vitamins capsule, Take 1 capsule by mouth daily., Disp: , Rfl:  .  brinzolamide (AZOPT) 1 %  ophthalmic suspension, Place 1 drop into both eyes daily. , Disp: , Rfl:  .  bromocriptine (PARLODEL) 2.5 MG tablet, Take 5 mg by mouth daily. , Disp: , Rfl: 0 .  diazepam (VALIUM) 2 MG tablet, Take 1 tablet by mouth as needed., Disp: , Rfl:  .  glucosamine-chondroitin 500-400 MG tablet, Take 2 tablets by mouth daily., Disp: , Rfl:  .  latanoprost (XALATAN) 0.005 % ophthalmic solution, Place 1 drop into both eyes at bedtime. , Disp: , Rfl:  .  Magnesium 500 MG TABS, Take 1 tablet by mouth daily. Patient says he takes magnesium orotate, Disp: , Rfl:  .  Omega-3 Fatty Acids (OMEGA ESSENTIALS BASIC) LIQD, Take 15 mLs by mouth daily., Disp: , Rfl:  .  PARoxetine (PAXIL) 40 MG tablet, Take 1 tablet (40 mg total) by mouth daily., Disp: 30 tablet, Rfl: 11 .  potassium chloride SA (K-DUR,KLOR-CON) 20 MEQ tablet, Take 1 tablet (20 mEq total) by mouth daily. (Patient taking differently: Take 20 mEq by mouth 2 (two) times daily. ), Disp: 90 tablet, Rfl: 3 .  quinapril (ACCUPRIL) 20 MG tablet, TAKE 1 TABLET BY MOUTH EVERY DAY, Disp: 90 tablet, Rfl: 3 .  Spacer/Aero-Holding Chambers DEVI, 1 Device by Does not apply route 4 (four) times daily., Disp: 1 each, Rfl: 0 .  torsemide (DEMADEX) 20 MG tablet,  Take 2 tablets (40 mg total) by mouth 2 (two) times daily., Disp: 180 tablet, Rfl: 3  Review of Systems  Constitutional: Negative for appetite change, chills and fever.  Respiratory: Positive for shortness of breath. Negative for chest tightness and wheezing.   Cardiovascular: Negative for chest pain and palpitations.  Gastrointestinal: Negative for abdominal pain, nausea and vomiting.  Genitourinary: Positive for dysuria, frequency and urgency. Negative for flank pain, hematuria and hesitancy.  Musculoskeletal: Positive for back pain.   Social History   Tobacco Use  . Smoking status: Former Smoker    Packs/day: 1.00    Years: 10.00    Pack years: 10.00    Types: Cigarettes    Last attempt to quit:  05/2006    Years since quitting: 12.1  . Smokeless tobacco: Never Used  . Tobacco comment: quit 05/2006  Substance Use Topics  . Alcohol use: No    Alcohol/week: 0.0 standard drinks     Objective:   BP 94/60 (BP Location: Right Arm, Patient Position: Sitting, Cuff Size: Large)   Pulse 89   Temp 98.4 F (36.9 C) (Oral)   Resp 16   Wt 206 lb (93.4 kg)   SpO2 97%   BMI 37.68 kg/m    Wt Readings from Last 3 Encounters:  06/20/18 206 lb (93.4 kg)  06/06/18 198 lb 8 oz (90 kg)  06/01/18 200 lb 9.6 oz (91 kg)   Vitals:   06/20/18 1030  BP: 94/60  Pulse: 89  Resp: 16  Temp: 98.4 F (36.9 C)  TempSrc: Oral  SpO2: 97%  Weight: 206 lb (93.4 kg)   Physical Exam Constitutional:      General: He is not in acute distress.    Appearance: He is well-developed.  HENT:     Head: Normocephalic and atraumatic.     Right Ear: Hearing normal.     Left Ear: Hearing normal.     Nose: Nose normal.  Eyes:     General: Lids are normal. No scleral icterus.       Right eye: No discharge.        Left eye: No discharge.     Conjunctiva/sclera: Conjunctivae normal.  Cardiovascular:     Rate and Rhythm: Normal rate and regular rhythm.     Heart sounds: Normal heart sounds.  Pulmonary:     Effort: Pulmonary effort is normal. No respiratory distress.     Breath sounds: No wheezing, rhonchi or rales.  Abdominal:     General: Bowel sounds are normal.     Tenderness: There is abdominal tenderness. There is no right CVA tenderness or left CVA tenderness.     Comments: Suprapubic discomfort to palpation. No urethral discharge.  Musculoskeletal:        General: No swelling.  Skin:    Findings: No lesion or rash.  Neurological:     Mental Status: He is alert and oriented to person, place, and time.  Psychiatric:        Speech: Speech normal.        Behavior: Behavior normal.        Thought Content: Thought content normal.       Assessment & Plan    1. Dysuria Developed frequency and  dysuria over the past 5 days. Some suprapubic discomfort but no documented fever. Urinalysis showed TNTC quantity of RBC's and WBC's on microscopic exam. Will get C&S and start Septra-DS. May use AZO-Standard for burning with urination and should drink extra fluids to  flush urinary tract. Recheck pending culture report. - POCT urinalysis dipstick - CULTURE, URINE COMPREHENSIVE - sulfamethoxazole-trimethoprim (BACTRIM DS,SEPTRA DS) 800-160 MG tablet; Take 1 tablet by mouth 2 (two) times daily.  Dispense: 20 tablet; Refill: 0  2. Chronic diastolic heart failure ([place]) Has gained 7+lbs in the past 2 weeks. No pulmonary congestion or peripheral edema noted. Has not been taking Torsemide recently. Has an appointment at the [facility] tomorrow. Recommend he restart the Torsemide 20 mg 2 BID and Klor-Con 20 meq BID today since he has had mild shortness of breath.     [name], PA  [facility]

## 2018-06-20 NOTE — Patient Instructions (Signed)
Urinary Tract Infection, Adult A urinary tract infection (UTI) is an infection of any part of the urinary tract. The urinary tract includes:  The kidneys.  The ureters.  The bladder.  The urethra. These organs make, store, and get rid of pee (urine) in the body. What are the causes? This is caused by germs (bacteria) in your genital area. These germs grow and cause swelling (inflammation) of your urinary tract. What increases the risk? You are more likely to develop this condition if:  You have a small, thin tube (catheter) to drain pee.  You cannot control when you pee or poop (incontinence).  You are male, and: ? You use these methods to prevent pregnancy: ? A medicine that kills sperm (spermicide). ? A device that blocks sperm (diaphragm). ? You have low levels of a male hormone (estrogen). ? You are pregnant.  You have genes that add to your risk.  You are sexually active.  You take antibiotic medicines.  You have trouble peeing because of: ? A prostate that is bigger than normal, if you are male. ? A blockage in the part of your body that drains pee from the bladder (urethra). ? A kidney stone. ? A nerve condition that affects your bladder (neurogenic bladder). ? Not getting enough to drink. ? Not peeing often enough.  You have other conditions, such as: ? Diabetes. ? A weak disease-fighting system (immune system). ? Sickle cell disease. ? Gout. ? Injury of the spine. What are the signs or symptoms? Symptoms of this condition include:  Needing to pee right away (urgently).  Peeing often.  Peeing small amounts often.  Pain or burning when peeing.  Blood in the pee.  Pee that smells bad or not like normal.  Trouble peeing.  Pee that is cloudy.  Fluid coming from the vagina, if you are male.  Pain in the belly or lower back. Other symptoms include:  Throwing up (vomiting).  No urge to eat.  Feeling mixed up (confused).  Being tired  and grouchy (irritable).  A fever.  Watery poop (diarrhea). How is this treated? This condition may be treated with:  Antibiotic medicine.  Other medicines.  Drinking enough water. Follow these instructions at home:  Medicines  Take over-the-counter and prescription medicines only as told by your doctor.  If you were prescribed an antibiotic medicine, take it as told by your doctor. Do not stop taking it even if you start to feel better. General instructions  Make sure you: ? Pee until your bladder is empty. ? Do not hold pee for a long time. ? Empty your bladder after sex. ? Wipe from front to back after pooping if you are a male. Use each tissue one time when you wipe.  Drink enough fluid to keep your pee pale yellow.  Keep all follow-up visits as told by your doctor. This is important. Contact a doctor if:  You do not get better after 1-2 days.  Your symptoms go away and then come back. Get help right away if:  You have very bad back pain.  You have very bad pain in your lower belly.  You have a fever.  You are sick to your stomach (nauseous).  You are throwing up. Summary  A urinary tract infection (UTI) is an infection of any part of the urinary tract.  This condition is caused by germs in your genital area.  There are many risk factors for a UTI. These include having a small, thin   tube to drain pee and not being able to control when you pee or poop.  Treatment includes antibiotic medicines for germs.  Drink enough fluid to keep your pee pale yellow. This information is not intended to replace advice given to you by your health care provider. Make sure you discuss any questions you have with your health care provider. Document Released: 09/08/2007 Document Revised: 09/29/2017 Document Reviewed: 09/29/2017 Elsevier Interactive Patient Education  2019 [employer]  

## 2018-06-21 ENCOUNTER — Ambulatory Visit: Payer: Medicare Other | Attending: Family | Admitting: Family

## 2018-06-21 ENCOUNTER — Encounter: Payer: Self-pay | Admitting: Family

## 2018-06-21 ENCOUNTER — Encounter: Payer: Self-pay | Admitting: Pharmacist

## 2018-06-21 VITALS — BP 146/86 | HR 107 | Resp 18 | Ht 62.0 in | Wt 203.5 lb

## 2018-06-21 DIAGNOSIS — Z885 Allergy status to narcotic agent status: Secondary | ICD-10-CM | POA: Insufficient documentation

## 2018-06-21 DIAGNOSIS — Z800 Family history of malignant neoplasm of digestive organs: Secondary | ICD-10-CM | POA: Diagnosis not present

## 2018-06-21 DIAGNOSIS — F329 Major depressive disorder, single episode, unspecified: Secondary | ICD-10-CM | POA: Diagnosis not present

## 2018-06-21 DIAGNOSIS — Q761 Klippel-Feil syndrome: Secondary | ICD-10-CM

## 2018-06-21 DIAGNOSIS — Z801 Family history of malignant neoplasm of trachea, bronchus and lung: Secondary | ICD-10-CM | POA: Insufficient documentation

## 2018-06-21 DIAGNOSIS — I5032 Chronic diastolic (congestive) heart failure: Secondary | ICD-10-CM | POA: Diagnosis not present

## 2018-06-21 DIAGNOSIS — Z933 Colostomy status: Secondary | ICD-10-CM | POA: Diagnosis not present

## 2018-06-21 DIAGNOSIS — J45909 Unspecified asthma, uncomplicated: Secondary | ICD-10-CM | POA: Diagnosis not present

## 2018-06-21 DIAGNOSIS — I110 Hypertensive heart disease with heart failure: Secondary | ICD-10-CM | POA: Insufficient documentation

## 2018-06-21 DIAGNOSIS — Z8041 Family history of malignant neoplasm of ovary: Secondary | ICD-10-CM | POA: Insufficient documentation

## 2018-06-21 DIAGNOSIS — F419 Anxiety disorder, unspecified: Secondary | ICD-10-CM | POA: Insufficient documentation

## 2018-06-21 DIAGNOSIS — Z87891 Personal history of nicotine dependence: Secondary | ICD-10-CM | POA: Insufficient documentation

## 2018-06-21 DIAGNOSIS — Z79899 Other long term (current) drug therapy: Secondary | ICD-10-CM | POA: Diagnosis not present

## 2018-06-21 DIAGNOSIS — H409 Unspecified glaucoma: Secondary | ICD-10-CM | POA: Diagnosis not present

## 2018-06-21 DIAGNOSIS — M1990 Unspecified osteoarthritis, unspecified site: Secondary | ICD-10-CM | POA: Insufficient documentation

## 2018-06-21 DIAGNOSIS — E785 Hyperlipidemia, unspecified: Secondary | ICD-10-CM | POA: Insufficient documentation

## 2018-06-21 DIAGNOSIS — I10 Essential (primary) hypertension: Secondary | ICD-10-CM

## 2018-06-21 DIAGNOSIS — Z8249 Family history of ischemic heart disease and other diseases of the circulatory system: Secondary | ICD-10-CM | POA: Diagnosis not present

## 2018-06-21 LAB — BASIC METABOLIC PANEL
Anion gap: 11 (ref 5–15)
BUN: 17 mg/dL (ref 6–20)
CO2: 32 mmol/L (ref 22–32)
Calcium: 8.8 mg/dL — ABNORMAL LOW (ref 8.9–10.3)
Chloride: 99 mmol/L (ref 98–111)
Creatinine, Ser: 1.17 mg/dL (ref 0.61–1.24)
GFR calc Af Amer: >60 mL/min (ref >60)
GFR calc non Af Amer: >60 mL/min (ref >60)
GLUCOSE: 166 mg/dL — AB (ref 70–99)
Potassium: 3.5 mmol/L (ref 3.5–5.1)
Sodium: 142 mmol/L (ref 135–145)

## 2018-06-21 NOTE — Progress Notes (Signed)
[place] - PHARMACIST COUNSELING NOTE  ADHERENCE ASSESSMENT  Adherence strategy: routine   Do you ever forget to take your medication? [] Yes (1) [x] No (0)  Do you ever skip doses due to side effects? [] Yes (1) [x] No (0)  Do you have trouble affording your medicines? [] Yes (1) [x] No (0)  Are you ever unable to pick up your medication due to transportation difficulties? [] Yes (1) [x] No (0)  Do you ever stop taking your medications because you don't believe they are helping? [] Yes (1) [x] No (0)  Total score _0______    Recommendations given to patient about increasing adherence: None. Reports good compliance with current strategy.  Guideline-Directed Medical Therapy/Evidence Based Medicine    ACE/ARB/ARNI: quinapril 20 mg daily   Beta Blocker: none   Aldosterone Antagonist: none Diuretic: torsemide 40 mg twice daily    SUBJECTIVE   HPI: Here for follow up visit. Reports some shortness of breath. Currently on antibiotic for UTI. Had stopped diuretic for a couple of days because he was already using the bathroom frequently with UTI.  Past Medical History:  Diagnosis Date  . Acid reflux   . Anxiety   . Arthritis   . Asthma   . CHF (congestive heart failure) ([place])   . Depression   . Glaucoma   . Hyperlipidemia   . Hypertension   . Klippel-Feil syndrome         OBJECTIVE    Vital signs: HR 107, BP 146/86, weight (pounds) 203.8  ECHO: Date 02/05/18, EF 55-60%    BMP Latest Ref Rng & Units 05/19/2018 05/10/2018 04/19/2018  Glucose 70 - 99 mg/dL 107(H) 96 130(H)  BUN 6 - 20 mg/dL 24(H) 24(H) 20  Creatinine 0.61 - 1.24 mg/dL 1.37(H) 1.13 1.12  BUN/Creat Ratio 9 - 20 - - -  Sodium 135 - 145 mmol/L 137 140 139  Potassium 3.5 - 5.1 mmol/L 3.3(L) 3.8 3.3(L)  Chloride 98 - 111 mmol/L 95(L) 98 97(L)  CO2 22 - 32 mmol/L 34(H) 34(H) 31  Calcium 8.9 - 10.3 mg/dL 9.1 8.7(L) 8.7(L)    ASSESSMENT 48 year old male with HFpEF. Reports  shortness of breath. Stopped taking diuretic for a couple of days because he was already using the bathroom frequently with UTI. PCP advised him to take again as he had gained about 5 pounds overnight. He took doses yesterday and weight decreased back to normal. Did not take this morning since he was coming to appointment and didn't want to have to be in bathroom frequently. Denies dizziness or light-headedness.   PLAN Continue current regimen. May need to increase torsemide if patient continues to experience increased shortness of breath.    Time spent: 10 minutes  [place], Pharm.D. 06/21/2018 11:23 AM    Current Outpatient Medications:  .  albuterol (PROVENTIL HFA;VENTOLIN HFA) 108 (90 Base) MCG/ACT inhaler, Inhale 2 puffs into the lungs every 4 (four) hours as needed for wheezing or shortness of breath., Disp: 1 Inhaler, Rfl: 2 .  ANDROGEL PUMP 20.25 MG/ACT (1.62%) GEL, Apply 1 application topically daily. , Disp: , Rfl:  .  b complex vitamins capsule, Take 1 capsule by mouth daily., Disp: , Rfl:  .  brinzolamide (AZOPT) 1 % ophthalmic suspension, Place 1 drop into both eyes daily. , Disp: , Rfl:  .  bromocriptine (PARLODEL) 2.5 MG tablet, Take 5 mg by mouth daily. , Disp: , Rfl: 0 .  diazepam (VALIUM) 2 MG tablet, Take 1 tablet by  mouth as needed., Disp: , Rfl:  .  glucosamine-chondroitin 500-400 MG tablet, Take 2 tablets by mouth daily., Disp: , Rfl:  .  latanoprost (XALATAN) 0.005 % ophthalmic solution, Place 1 drop into both eyes at bedtime. , Disp: , Rfl:  .  Magnesium 500 MG TABS, Take 1 tablet by mouth daily. Patient says he takes magnesium orotate, Disp: , Rfl:  .  Omega-3 Fatty Acids (OMEGA ESSENTIALS BASIC) LIQD, Take 15 mLs by mouth daily., Disp: , Rfl:  .  PARoxetine (PAXIL) 40 MG tablet, Take 1 tablet (40 mg total) by mouth daily., Disp: 30 tablet, Rfl: 11 .  potassium chloride SA (K-DUR,KLOR-CON) 20 MEQ tablet, Take 1 tablet (20 mEq total) by mouth daily. (Patient  taking differently: Take 20 mEq by mouth 2 (two) times daily. ), Disp: 90 tablet, Rfl: 3 .  quinapril (ACCUPRIL) 20 MG tablet, TAKE 1 TABLET BY MOUTH EVERY DAY, Disp: 90 tablet, Rfl: 3 .  Spacer/Aero-Holding Chambers DEVI, 1 Device by Does not apply route 4 (four) times daily., Disp: 1 each, Rfl: 0 .  sulfamethoxazole-trimethoprim (BACTRIM DS,SEPTRA DS) 800-160 MG tablet, Take 1 tablet by mouth 2 (two) times daily., Disp: 20 tablet, Rfl: 0 .  torsemide (DEMADEX) 20 MG tablet, Take 2 tablets (40 mg total) by mouth 2 (two) times daily., Disp: 180 tablet, Rfl: 3   COUNSELING POINTS/CLINICAL PEARLS Torsemide  Side effects may include excessive urination.  Tell patient to report symptoms of ototoxicity.  Instruct patient to report lightheadedness or syncope.  Warn patient to avoid use of nonprescription NSAID products without first discussing it with their healthcare provider.   DRUGS TO AVOID IN HEART FAILURE  Drug or Class Mechanism  Analgesics . NSAIDs . COX-2 inhibitors . Glucocorticoids  Sodium and water retention, increased systemic vascular resistance, decreased response to diuretics   Diabetes Medications . Metformin . Thiazolidinediones o Rosiglitazone (Avandia) o Pioglitazone (Actos) . DPP4 Inhibitors o Saxagliptin (Onglyza) o Sitagliptin (Januvia)   Lactic acidosis Possible calcium channel blockade   Unknown  Antiarrhythmics . Class I  o Flecainide o Disopyramide . Class III o Sotalol . Other o Dronedarone  Negative inotrope, proarrhythmic   Proarrhythmic, beta blockade  Negative inotrope  Antihypertensives . Alpha Blockers o Doxazosin . Calcium Channel Blockers o Diltiazem o Verapamil o Nifedipine . Central Alpha Adrenergics o Moxonidine . Peripheral Vasodilators o Minoxidil  Increases renin and aldosterone  Negative inotrope    Possible sympathetic withdrawal  Unknown  Anti-infective . Itraconazole . Amphotericin B  Negative  inotrope Unknown  Hematologic . Anagrelide . Cilostazol   Possible inhibition of PD IV Inhibition of PD III causing arrhythmias  Neurologic/Psychiatric . Stimulants . Anti-Seizure Drugs o Carbamazepine o Pregabalin . Antidepressants o Tricyclics o Citalopram . Parkinsons o Bromocriptine o Pergolide o Pramipexole . Antipsychotics o Clozapine . Antimigraine o Ergotamine o Methysergide . Appetite suppressants . Bipolar o Lithium  Peripheral alpha and beta agonist activity  Negative inotrope and chronotrope Calcium channel blockade  Negative inotrope, proarrhythmic Dose-dependent QT prolongation  Excessive serotonin activity/valvular damage Excessive serotonin activity/valvular damage Unknown  IgE mediated hypersensitivy, calcium channel blockade  Excessive serotonin activity/valvular damage Excessive serotonin activity/valvular damage Valvular damage  Direct myofibrillar degeneration, adrenergic stimulation  Antimalarials . Chloroquine . Hydroxychloroquine Intracellular inhibition of lysosomal enzymes  Urologic Agents . Alpha Blockers o Doxazosin o Prazosin o Tamsulosin o Terazosin  Increased renin and aldosterone  Adapted from Page RL, et al. "Drugs That May Cause or Exacerbate Heart Failure: A Scientific Statement from the  American Heart  Association." Circulation 2016; 015:T96-Q95. DOI: 10.1161/CIR.0000000000000426   MEDICATION ADHERENCES TIPS AND STRATEGIES 1. Taking medication as prescribed improves patient outcomes in heart failure (reduces hospitalizations, improves symptoms, increases survival) 2. Side effects of medications can be managed by decreasing doses, switching agents, stopping drugs, or adding additional therapy. Please let someone in the [facility] know if you have having bothersome side effects so we can modify your regimen. Do not alter your medication regimen without talking to [place].  3. Medication reminders can help patients  remember to take drugs on time. If you are missing or forgetting doses you can try linking behaviors, using pill boxes, or an electronic reminder like an alarm on your phone or an app. Some people can also get automated phone calls as medication reminders.

## 2018-06-21 NOTE — Patient Instructions (Signed)
Continue weighing daily and call for an overnight weight gain of > 2 pounds or a weekly weight gain of >5 pounds. 

## 2018-06-22 LAB — CULTURE, URINE COMPREHENSIVE

## 2018-06-27 DIAGNOSIS — Q02 Microcephaly: Secondary | ICD-10-CM | POA: Diagnosis not present

## 2018-06-27 DIAGNOSIS — I10 Essential (primary) hypertension: Secondary | ICD-10-CM | POA: Diagnosis not present

## 2018-06-27 DIAGNOSIS — Q761 Klippel-Feil syndrome: Secondary | ICD-10-CM | POA: Diagnosis not present

## 2018-06-27 DIAGNOSIS — E291 Testicular hypofunction: Secondary | ICD-10-CM | POA: Diagnosis not present

## 2018-06-27 DIAGNOSIS — Q540 Hypospadias, balanic: Secondary | ICD-10-CM | POA: Diagnosis not present

## 2018-06-27 DIAGNOSIS — E785 Hyperlipidemia, unspecified: Secondary | ICD-10-CM | POA: Diagnosis not present

## 2018-06-27 DIAGNOSIS — R0603 Acute respiratory distress: Secondary | ICD-10-CM | POA: Diagnosis not present

## 2018-06-27 DIAGNOSIS — I5031 Acute diastolic (congestive) heart failure: Secondary | ICD-10-CM | POA: Diagnosis not present

## 2018-06-27 DIAGNOSIS — E221 Hyperprolactinemia: Secondary | ICD-10-CM | POA: Diagnosis not present

## 2018-06-27 DIAGNOSIS — D352 Benign neoplasm of pituitary gland: Secondary | ICD-10-CM | POA: Diagnosis not present

## 2018-07-04 DIAGNOSIS — E291 Testicular hypofunction: Secondary | ICD-10-CM | POA: Diagnosis not present

## 2018-07-04 DIAGNOSIS — I10 Essential (primary) hypertension: Secondary | ICD-10-CM | POA: Diagnosis not present

## 2018-07-04 DIAGNOSIS — I5031 Acute diastolic (congestive) heart failure: Secondary | ICD-10-CM | POA: Diagnosis not present

## 2018-07-04 DIAGNOSIS — Q761 Klippel-Feil syndrome: Secondary | ICD-10-CM | POA: Diagnosis not present

## 2018-07-04 DIAGNOSIS — F5101 Primary insomnia: Secondary | ICD-10-CM | POA: Diagnosis not present

## 2018-07-04 DIAGNOSIS — E785 Hyperlipidemia, unspecified: Secondary | ICD-10-CM | POA: Diagnosis not present

## 2018-07-04 DIAGNOSIS — E6609 Other obesity due to excess calories: Secondary | ICD-10-CM | POA: Diagnosis not present

## 2018-07-04 DIAGNOSIS — Z6837 Body mass index (BMI) 37.0-37.9, adult: Secondary | ICD-10-CM | POA: Diagnosis not present

## 2018-07-04 DIAGNOSIS — Q540 Hypospadias, balanic: Secondary | ICD-10-CM | POA: Diagnosis not present

## 2018-07-04 DIAGNOSIS — D352 Benign neoplasm of pituitary gland: Secondary | ICD-10-CM | POA: Diagnosis not present

## 2018-07-04 DIAGNOSIS — F4322 Adjustment disorder with anxiety: Secondary | ICD-10-CM | POA: Diagnosis not present

## 2018-07-04 DIAGNOSIS — E221 Hyperprolactinemia: Secondary | ICD-10-CM | POA: Diagnosis not present

## 2018-07-14 ENCOUNTER — Telehealth: Payer: Self-pay | Admitting: Cardiovascular Disease

## 2018-07-14 NOTE — Telephone Encounter (Signed)
Pt c/o swelling: STAT is pt has developed SOB within 24 hours  1) How much weight have you gained and in what time span? 5 pounds since Monday  2) If swelling, where is the swelling located? Bilateral ankles and mid section  3) Are you currently taking a fluid pill? Yes   4) Are you currently SOB? yes  5) Do you have a log of your daily weights (if so, list)?   4/6-196  4/7-198  4/8-200  4/9-201.6  6) Have you gained 3 pounds in a day or 5 pounds in a week? yes  7) Have you traveled recently? no

## 2018-07-14 NOTE — Telephone Encounter (Signed)
Spoke with the pt. Pt is followed in the CHF clinic. He contacted their office but they are closed, he was told to contact [facility] office. Pt sts that his weight has been climbing since Monday. Pt reports a 5ln weight gain in the last 5-6 days. Pt sts that he is a bit more sob with exertion. He denies orthopnea, PND. He uses O2 prn an night. Pt reports increased LE edema and swelling in his abdomen. He reports compliance with a low sodium diet and he denies excessive fluid intake. He reports a decrease in urine out put.  Pt confirms that he is taking Torsemide 40mg  bid and 38mEq of potassium bid. Adv pt to take an additional 20mg  of Torsemide today. I will defer further recommendation to Dr.Gollan/ or CHF clinic. Adv the pt that if his symptoms worsen in the interim, he should contact the office and speak with the on call provider for recommendation. Pt agreeable and verbalized understanding.

## 2018-07-17 NOTE — Telephone Encounter (Signed)
Would increase torsemide up to 60 BID Potassium always low, 3.3-3.5 Would confirm he is taking potassium 31meq BID Ifthat is the case, Would increase potassium up to 40 meq BID Will cc CHF clinic

## 2018-07-18 MED ORDER — TORSEMIDE 20 MG PO TABS: 60.0000 mg | ORAL_TABLET | Freq: Two times a day (BID) | ORAL | 3 refills | Status: DC

## 2018-07-18 MED ORDER — POTASSIUM CHLORIDE CRYS ER 20 MEQ PO TBCR: 40.0000 meq | EXTENDED_RELEASE_TABLET | Freq: Two times a day (BID) | ORAL | 3 refills | Status: DC

## 2018-07-18 NOTE — Telephone Encounter (Signed)
Spoke with patient and reviewed provider recommendations. Reviewed potassium instructions and he reports taking Potassium 20 mEq twice daily. Instructed him that we would increase his torsemide up to 60 mg twice daily and also increase his potassium up to 40 mEq twice daily. Advised that we would also route this message to the CHF Clinic per provider recommendations for continued monitoring. Instructed him to please call back if no improvement in his symptoms. He was appreciative for the call with no further questions at this time.

## 2018-07-20 ENCOUNTER — Telehealth: Payer: Self-pay | Admitting: Family

## 2018-07-20 MED ORDER — CHLORTHALIDONE 25 MG PO TABS: 25.0000 mg | ORAL_TABLET | Freq: Every day | ORAL | 0 refills | Status: DC

## 2018-07-20 MED ORDER — METOLAZONE 2.5 MG PO TABS: 2.5000 mg | ORAL_TABLET | Freq: Every day | ORAL | 0 refills | Status: DC

## 2018-07-20 NOTE — Telephone Encounter (Signed)
Returned's patient's message that he had left but had to leave a message on his voicemail. Advised that I would send in metolazone 2.5mg  daily for 3 days in addition to his current torsemide dose. Had tried this last year but insurance denied it but now we are in a new calendar year so hopefully it'll be approved. If it is not, he could try taking an additional 40mg  torsemide to see if that helps. Could bring him in for IV lasix if needed but his wife works and transportation is sometimes difficult. He has been taking 60mg  torsemide BID for the last 3 days.   Advised patient that I would be here today until 2:00pm and then will be off tomorrow (Friday). Should he not return my call prior to 2:00pm and symptoms are not improving with either the metolazone or extra torsemide, he's to call his cardiology office [facility]) or present to the ED if shortness of breath gets worse.

## 2018-07-20 NOTE — Telephone Encounter (Signed)
Called patient back regarding denial of metolazone use by his insurance company and other alternatives that his insurance would cover. Will send in chlorthalidone 25mg  daily for the next 3 days without refills. Advised patient that should he respond great to 1 or 2 doses, that he does not need to take the 3rd dose. He is to continue his torsemide as directed (60mg  BID for 3 days) along with extra potassium. He's to take additional 2meq potassium these next 3 days as well.   Should symptoms worsen, he can call his cardiologist or present to the ED if needed. Patient verbalized understanding of instructions.

## 2018-07-20 NOTE — Telephone Encounter (Signed)
-----   Message from Gaylord Shih, [place] sent at 07/20/2018  8:41 AM EDT ----- Regarding: Call Patient Scott [name] called the office and left a message stating that his other doctor had increased his torsemide to 3 times a day but he doesn't feel like that has made a difference.  He feels like his weight is still increasing and that he is become more and more short of breath.

## 2018-09-04 DIAGNOSIS — I5031 Acute diastolic (congestive) heart failure: Secondary | ICD-10-CM | POA: Diagnosis not present

## 2018-09-04 DIAGNOSIS — E785 Hyperlipidemia, unspecified: Secondary | ICD-10-CM | POA: Diagnosis not present

## 2018-09-04 DIAGNOSIS — E291 Testicular hypofunction: Secondary | ICD-10-CM | POA: Diagnosis not present

## 2018-09-04 DIAGNOSIS — D352 Benign neoplasm of pituitary gland: Secondary | ICD-10-CM | POA: Diagnosis not present

## 2018-09-04 DIAGNOSIS — E6609 Other obesity due to excess calories: Secondary | ICD-10-CM | POA: Diagnosis not present

## 2018-09-04 DIAGNOSIS — E221 Hyperprolactinemia: Secondary | ICD-10-CM | POA: Diagnosis not present

## 2018-09-04 DIAGNOSIS — I10 Essential (primary) hypertension: Secondary | ICD-10-CM | POA: Diagnosis not present

## 2018-09-11 ENCOUNTER — Other Ambulatory Visit: Payer: Self-pay

## 2018-09-11 DIAGNOSIS — E221 Hyperprolactinemia: Secondary | ICD-10-CM | POA: Diagnosis not present

## 2018-09-11 DIAGNOSIS — Z6839 Body mass index (BMI) 39.0-39.9, adult: Secondary | ICD-10-CM | POA: Diagnosis not present

## 2018-09-11 DIAGNOSIS — E291 Testicular hypofunction: Secondary | ICD-10-CM | POA: Diagnosis not present

## 2018-09-11 DIAGNOSIS — E6609 Other obesity due to excess calories: Secondary | ICD-10-CM | POA: Diagnosis not present

## 2018-09-11 DIAGNOSIS — E785 Hyperlipidemia, unspecified: Secondary | ICD-10-CM | POA: Diagnosis not present

## 2018-09-11 DIAGNOSIS — I5031 Acute diastolic (congestive) heart failure: Secondary | ICD-10-CM | POA: Diagnosis not present

## 2018-09-11 DIAGNOSIS — Q761 Klippel-Feil syndrome: Secondary | ICD-10-CM | POA: Diagnosis not present

## 2018-09-11 DIAGNOSIS — Q02 Microcephaly: Secondary | ICD-10-CM | POA: Diagnosis not present

## 2018-09-11 DIAGNOSIS — I10 Essential (primary) hypertension: Secondary | ICD-10-CM | POA: Diagnosis not present

## 2018-09-11 DIAGNOSIS — D352 Benign neoplasm of pituitary gland: Secondary | ICD-10-CM | POA: Diagnosis not present

## 2018-09-11 DIAGNOSIS — Q540 Hypospadias, balanic: Secondary | ICD-10-CM | POA: Diagnosis not present

## 2018-09-11 DIAGNOSIS — F4322 Adjustment disorder with anxiety: Secondary | ICD-10-CM | POA: Diagnosis not present

## 2018-09-11 MED ORDER — TORSEMIDE 20 MG PO TABS: 60.0000 mg | ORAL_TABLET | Freq: Two times a day (BID) | ORAL | 3 refills | Status: DC

## 2018-09-19 ENCOUNTER — Telehealth: Payer: Self-pay

## 2018-09-19 NOTE — Telephone Encounter (Signed)
TELEPHONE CALL NOTE  Scott [name] has been deemed a candidate for a follow-up tele-health visit to limit community exposure during the Covid-19 pandemic. I spoke with the patient via phone to ensure availability of phone/video source, confirm preferred email & phone number, discuss instructions and expectations, and review consent.   I reminded Scott [name] to be prepared with any vital sign and/or heart rhythm information that could potentially be obtained via home monitoring, at the time of his visit.  Finally, I reminded Scott [name] to expect an e-mail containing a link for their video-based visit approximately 15 minutes before his visit, or alternatively, a phone call at the time of his visit if his visit is planned to be a phone encounter.  Did the patient verbally consent to treatment as below? YES  Gaylord Shih, CMA 09/19/2018 1:45 PM  CONSENT FOR TELE-HEALTH VISIT - PLEASE REVIEW  I hereby voluntarily request, consent and authorize [facility] and its employed or contracted physicians, physician assistants, nurse practitioners or other licensed health care professionals (the Practitioner), to provide me with telemedicine health care services (the "Services") as deemed necessary by the treating Practitioner. I acknowledge and consent to receive the Services by the Practitioner via telemedicine. I understand that the telemedicine visit will involve communicating with the Practitioner through telephonic communication technology and the disclosure of certain medical information by electronic transmission. I acknowledge that I have been given the opportunity to request an in-person assessment or other available alternative prior to the telemedicine visit and am voluntarily participating in the telemedicine visit.  I understand that I have the right to withhold or withdraw my consent to the use of telemedicine in the course of my care at any time, without affecting my right to  future care or treatment, and that the Practitioner or I may terminate the telemedicine visit at any time. I understand that I have the right to inspect all information obtained and/or recorded in the course of the telemedicine visit and may receive copies of available information for a reasonable fee.  I understand that some of the potential risks of receiving the Services via telemedicine include:  Marland Kitchen Delay or interruption in medical evaluation due to technological equipment failure or disruption; . Information transmitted may not be sufficient (e.g. poor resolution of images) to allow for appropriate medical decision making by the Practitioner; and/or  . In rare instances, security protocols could fail, causing a breach of personal health information.  Furthermore, I acknowledge that it is my responsibility to provide information about my medical history, conditions and care that is complete and accurate to the best of my ability. I acknowledge that Practitioner's advice, recommendations, and/or decision may be based on factors not within their control, such as incomplete or inaccurate data provided by me or lack of visual representation. I understand that the practice of medicine is not an exact science and that Practitioner makes no warranties or guarantees regarding treatment outcomes. I acknowledge that I will receive a copy of this consent concurrently upon execution via email to the email address I last provided but may also request a printed copy by calling the office of [facility].    I understand that my insurance may be billed for this visit.   I have read or had this consent read to me. . I understand the contents of this consent, which adequately explains the benefits and risks of the Services being provided via telemedicine.  Marland Kitchen  I have been provided ample opportunity to ask questions regarding this consent and the Services and have had my questions answered to my  satisfaction. . I give my informed consent for the services to be provided through the use of telemedicine in my medical care  By participating in this telemedicine visit I agree to the above.

## 2018-09-19 NOTE — Telephone Encounter (Signed)
   TELEPHONE CALL NOTE  This patient has been deemed a candidate for follow-up tele-health visit to limit community exposure during the Covid-19 pandemic. I spoke with the patient via phone to discuss instructions. The patient was advised to review the section on consent for treatment as well. The patient will receive a phone call 2-3 days prior to their E-Visit at which time consent will be verbally confirmed. A Virtual Office Visit appointment type has been scheduled for 09/20/2018 with Darylene Price FNP.  Gaylord Shih, CMA 09/19/2018 1:44 PM

## 2018-09-20 ENCOUNTER — Other Ambulatory Visit: Payer: Self-pay

## 2018-09-20 ENCOUNTER — Encounter: Payer: Self-pay | Admitting: Family

## 2018-09-20 ENCOUNTER — Ambulatory Visit: Payer: Medicare Other | Attending: Family | Admitting: Family

## 2018-09-20 VITALS — Wt 205.0 lb

## 2018-09-20 DIAGNOSIS — I10 Essential (primary) hypertension: Secondary | ICD-10-CM

## 2018-09-20 DIAGNOSIS — Q761 Klippel-Feil syndrome: Secondary | ICD-10-CM

## 2018-09-20 DIAGNOSIS — I5032 Chronic diastolic (congestive) heart failure: Secondary | ICD-10-CM

## 2018-09-20 NOTE — Patient Instructions (Signed)
Continue weighing daily and call for an overnight weight gain of > 2 pounds or a weekly weight gain of >5 pounds. 

## 2018-09-20 NOTE — Progress Notes (Signed)
Virtual Visit via Telephone Note    Evaluation Performed:  Follow-up visit  This visit type was conducted due to national recommendations for restrictions regarding the COVID-19 Pandemic (e.g. social distancing).  This format is felt to be most appropriate for this patient at this time.  All issues noted in this document were discussed and addressed.  No physical exam was performed (except for noted visual exam findings with Video Visits).  Please refer to the patient's chart (MyChart message for video visits and phone note for telephone visits) for the patient's consent to telehealth for [facility]  Date:  09/20/2018   ID:  Scott [name], DOB 14-Nov-1970, MRN [id number]  Patient Location:  [address]. [place] [address]   Provider location:   [facility] [place] [address], [place] [address]  PCP:  Margo Common, [place]  Cardiologist:  Ida Rogue, MD Electrophysiologist:  None   Chief Complaint:  Shortness of breath  History of Present Illness:    Scott [name] is a 48 y.o. male who presents via audio/video conferencing for a telehealth visit today.  Patient verified DOB and address.  The patient does not have symptoms concerning for COVID-19 infection (fever, chills, cough, or new SHORTNESS OF BREATH).   Patient reports minimal shortness of breath upon moderate exertion. He describes this as improving since his endocrinologist placed him on spironolactone. He has associated fatigue along with this. He denies any swelling in his legs/ abdomen, palpitations, chest pain, fever, cough or weight gain. In fact, he says that he's lost ~ 5 pounds since starting the spironolactone.   He mentions repeatedly about his concern that he may have been exposed to coronavirus while getting his car serviced. He says that he wasn't wearing a mask that day and an employee (who also wasn't wearing a mask) said that his wife had recently tested positive for  COVID. This occurred ~ 1 week ago and patient was greatly concerned and wanted information regarding testing. He filled out an online questionnaire and the recommendation was for him to shelter in place and not get tested.   Prior CV studies:   The following studies were reviewed today:  Echo report from 02/05/18 reviewed and showed an EF of 55-60%  Past Medical History:  Diagnosis Date  . Acid reflux   . Anxiety   . Arthritis   . Asthma   . CHF (congestive heart failure) ([place])   . Depression   . Glaucoma   . Hyperlipidemia   . Hypertension   . Klippel-Feil syndrome    Past Surgical History:  Procedure Laterality Date  . COLOSTOMY    . DENVER SHUNT PLACEMENT       Current Meds  Medication Sig  . albuterol (PROVENTIL HFA;VENTOLIN HFA) 108 (90 Base) MCG/ACT inhaler Inhale 2 puffs into the lungs every 4 (four) hours as needed for wheezing or shortness of breath.  . Ascorbic Acid (VITAMIN C) 1000 MG tablet Take 1,000 mg by mouth 2 (two) times a day.  . b complex vitamins capsule Take 1 capsule by mouth daily.  . bromocriptine (PARLODEL) 2.5 MG tablet Take 5 mg by mouth daily.   . Cinnamon 500 MG capsule Take 1,000 mg by mouth daily.  Marland Kitchen glucosamine-chondroitin 500-400 MG tablet Take 2 tablets by mouth daily.  . Lactobacillus (PROBIOTIC ACIDOPHILUS PO) Take by mouth as directed.  . latanoprost (XALATAN) 0.005 % ophthalmic solution Place 1 drop into both eyes at  bedtime.   . Magnesium 500 MG TABS Take 1 tablet by mouth daily. Patient says he takes magnesium orotate  . Omega-3 Fatty Acids (OMEGA ESSENTIALS BASIC) LIQD Take 15 mLs by mouth daily.  Marland Kitchen PARoxetine (PAXIL) 40 MG tablet Take 1 tablet (40 mg total) by mouth daily.  . potassium chloride SA (K-DUR,KLOR-CON) 20 MEQ tablet Take 2 tablets (40 mEq total) by mouth 2 (two) times daily. (Patient taking differently: Take 20 mEq by mouth 2 (two) times daily. )  . quinapril (ACCUPRIL) 20 MG tablet TAKE 1 TABLET BY MOUTH EVERY DAY  .  Spacer/Aero-Holding Chambers DEVI 1 Device by Does not apply route 4 (four) times daily.  Marland Kitchen spironolactone (ALDACTONE) 50 MG tablet Take 50 mg by mouth daily.  Marland Kitchen torsemide (DEMADEX) 20 MG tablet Take 3 tablets (60 mg total) by mouth 2 (two) times daily.     Allergies:   Codeine   Social History   Tobacco Use  . Smoking status: Former Smoker    Packs/day: 1.00    Years: 10.00    Pack years: 10.00    Types: Cigarettes    Quit date: 05/2006    Years since quitting: 12.3  . Smokeless tobacco: Never Used  . Tobacco comment: quit 05/2006  Substance Use Topics  . Alcohol use: No    Alcohol/week: 0.0 standard drinks  . Drug use: No     Family Hx: The patient's family history includes Cancer in his paternal grandfather; Colon cancer in his father; Heart disease in his father; Lung cancer in his maternal grandmother; Multiple sclerosis in his mother; Ovarian cancer in his paternal grandmother; Thyroid cancer in his maternal grandfather.  ROS:   Please see the history of present illness.     All other systems reviewed and are negative.   Labs/Other Tests and Data Reviewed:    Recent Labs: 02/04/2018: ALT 52; TSH 2.826 02/13/2018: Magnesium 2.4 02/20/2018: Hemoglobin 16.6; Platelets 355 05/19/2018: B Natriuretic Peptide 26.0 06/21/2018: BUN 17; Creatinine, Ser 1.17; Potassium 3.5; Sodium 142   Recent Lipid Panel Lab Results  Component Value Date/Time   CHOL 155 02/07/2018 05:08 AM   TRIG 74 02/07/2018 05:08 AM   HDL 55 02/07/2018 05:08 AM   CHOLHDL 2.8 02/07/2018 05:08 AM   LDLCALC 85 02/07/2018 05:08 AM    Wt Readings from Last 3 Encounters:  09/20/18 205 lb (93 kg)  06/21/18 203 lb 8 oz (92.3 kg)  06/20/18 206 lb (93.4 kg)     Exam:    Vital Signs:  Wt 205 lb (93 kg) Comment: self-reported  BMI 37.49 kg/m    Well nourished, well developed male in no  acute distress.   ASSESSMENT & PLAN:    1.  Chronic heart failure with preserved ejection fraction- - NYHA  class II - euvolemic today based on patient's description of symptoms - weighing daily; reminded to call for an overnight weight gain of >2 pounds or a weekly weight gain of >5 pounds - spironolactone had been added and he has continued potassium supplements; will message PCP about what would be easiest for patient to get BMP drawn either at his office or ours; will call patient back once I hear from PCP - not adding salt and has been reading food labels. Reviewed the importance of closely following a 2000mg  sodium diet - saw cardiology Fletcher Anon) 06/06/2018 - BNP on 05/19/2018 was 26.0  2: HTN- - not checking BP - saw PCP ([name]) 06/20/2018  - BMP from 06/21/2018 reviewed  and showed sodium 142, potassium 3.5, creatinine 1.17 and GFR >60  3: Klippel-Fiel syndrome- - saw pulmonology Patsey Berthold) 01/25/18 - has been dismissed from [facility] pulmonology - had PFT's 02/21/18 - wears oxygen at 2L PRN  COVID-19 Education: The signs and symptoms of COVID-19 were discussed with the patient and how to seek care for testing (follow up with PCP or arrange E-visit).  The importance of social distancing was discussed today.  Encouraged patient to message his PCP about his concern regarding coronavirus exposure and whether he should be tested or not. Patient says that he will contact his PCP.   Patient Risk:   After full review of this patients clinical status, I feel that they are at least moderate risk at this time.  Time:   Today, I have spent 102minutes with the patient with telehealth technology discussing medications, diet and symptoms to report.     Medication Adjustments/Labs and Tests Ordered: Current medicines are reviewed at length with the patient today.  Concerns regarding medicines are outlined above.   Tests Ordered: No orders of the defined types were placed in this encounter.  Medication Changes: No orders of the defined types were placed in this encounter.   Disposition:  Follow-up in 3 months or sooner for any questions/problems before then.   Signed, Alisa Graff, FNP  09/20/2018 11:29 AM    [facility]

## 2018-09-21 ENCOUNTER — Other Ambulatory Visit: Payer: Self-pay | Admitting: Family Medicine

## 2018-09-21 DIAGNOSIS — I10 Essential (primary) hypertension: Secondary | ICD-10-CM

## 2018-09-21 DIAGNOSIS — I5032 Chronic diastolic (congestive) heart failure: Secondary | ICD-10-CM

## 2018-09-21 DIAGNOSIS — Z8639 Personal history of other endocrine, nutritional and metabolic disease: Secondary | ICD-10-CM

## 2018-10-17 ENCOUNTER — Telehealth: Payer: Self-pay | Admitting: *Deleted

## 2018-10-17 ENCOUNTER — Encounter: Payer: Self-pay | Admitting: Family Medicine

## 2018-10-17 ENCOUNTER — Telehealth (INDEPENDENT_AMBULATORY_CARE_PROVIDER_SITE_OTHER): Payer: Medicare Other | Admitting: Family Medicine

## 2018-10-17 ENCOUNTER — Other Ambulatory Visit: Payer: Medicare Other

## 2018-10-17 DIAGNOSIS — R6889 Other general symptoms and signs: Secondary | ICD-10-CM | POA: Diagnosis not present

## 2018-10-17 DIAGNOSIS — L509 Urticaria, unspecified: Secondary | ICD-10-CM | POA: Diagnosis not present

## 2018-10-17 DIAGNOSIS — Z20822 Contact with and (suspected) exposure to covid-19: Secondary | ICD-10-CM

## 2018-10-17 DIAGNOSIS — I5032 Chronic diastolic (congestive) heart failure: Secondary | ICD-10-CM | POA: Diagnosis not present

## 2018-10-17 DIAGNOSIS — R6883 Chills (without fever): Secondary | ICD-10-CM | POA: Diagnosis not present

## 2018-10-17 NOTE — Telephone Encounter (Signed)
48 year old male with a history of CHF and Klippel-Feil Syndrome, developed chills, malaise, hives and sore throat over the past 1-2 days. No specific COVID-19 exposure. Recommend he have the COVID-19 test and isolate at home for the next 5-7 days.    Sincerely,  [name] [name] PA   Pt called and scheduled for testing at [facility] site on 10/17/18. Pt advised to wear a mask and remain in the car at scheduled appt time. Pt verbalized understanding.

## 2018-10-17 NOTE — Progress Notes (Signed)
Scott [name]  MRN: [id number] DOB: November 22, 1970  Subjective:  HPI   Virtual Visit via Video Note  I connected with Scott [name] on 10/17/18 at  9:40 AM EDT by a video enabled telemedicine application and verified that I am speaking with the correct person using two identifiers.  Location: Patient: In his personal car. Provider: Office   I discussed the limitations of evaluation and management by telemedicine and the availability of in person appointments. The patient expressed understanding and agreed to proceed.  Patient Active Problem List   Diagnosis Date Noted  . Chronic diastolic heart failure ([place]) 02/16/2018  . Accelerated hypertension 02/05/2018  . Acute diastolic heart failure ([place])   . Kyphoscoliosis and scoliosis 01/30/2018  . Erectile dysfunction 09/09/2016  . Disorder of bursae of shoulder region 09/07/2016  . Impingement syndrome of shoulder region 09/07/2016  . Anxiety 09/11/2014  . Airway hyperreactivity 09/11/2014  . CN (constipation) 09/11/2014  . Alopecia, male pattern 09/11/2014  . Decreased libido 09/11/2014  . Dysfunction of eustachian tube 09/11/2014  . Groin strain 09/11/2014  . Cephalalgia 09/11/2014  . Umbilical hernia 40/98/1191  . Essential hypertension 09/11/2014  . Migraine 09/11/2014  . Neoplasm of uncertain behavior 47/82/9562  . Hernia of anterior abdominal wall 09/11/2014  . Contact dermatitis due to Genus Toxicodendron 09/11/2014  . Open-angle glaucoma 05/20/2009  . Congenital hydrocephalus ([place]) 05/20/2009  . 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 Enteritis presumed infectious 06/11/2008  . HLD (hyperlipidemia) 03/15/2008  . Clinical depression 06/13/2007   Past Medical History:  Diagnosis Date  . Acid reflux   . Anxiety   . Arthritis   . Asthma   . CHF (congestive heart failure) ([place])   . Depression   . Glaucoma   . Hyperlipidemia   . Hypertension   . Klippel-Feil syndrome     Social History   Socioeconomic History  .  Marital status: Single    Spouse name: Not on file  . Number of children: 0  . Years of education: Not on file  . Highest education level: Some college, no degree  Occupational History  . Occupation: disability  Social Needs  . Financial resource strain: Not hard at all  . Food insecurity    Worry: Never true    Inability: Never true  . Transportation needs    Medical: No    Non-medical: No  Tobacco Use  . Smoking status: Former Smoker    Packs/day: 1.00    Years: 10.00    Pack years: 10.00    Types: Cigarettes    Quit date: 05/2006    Years since quitting: 12.4  . Smokeless tobacco: Never Used  . Tobacco comment: quit 05/2006  Substance and Sexual Activity  . Alcohol use: No    Alcohol/week: 0.0 standard drinks  . Drug use: No  . Sexual activity: Not on file  Lifestyle  . Physical activity    Days per week: 0 days    Minutes per session: 0 min  . Stress: Very much  Relationships  . Social Herbalist on phone: Patient refused    Gets together: Patient refused    Attends religious service: Patient refused    Active member of club or organization: Patient refused    Attends meetings of clubs or organizations: Patient refused    Relationship status: Patient refused  . Intimate partner violence    Fear of current or ex partner: Patient refused    Emotionally abused: Patient refused  Physically abused: Patient refused    Forced sexual activity: Patient refused  Other Topics Concern  . Not on file  Social History Narrative  . Not on file    Outpatient Encounter Medications as of 10/17/2018  Medication Sig  . albuterol (PROVENTIL HFA;VENTOLIN HFA) 108 (90 Base) MCG/ACT inhaler Inhale 2 puffs into the lungs every 4 (four) hours as needed for wheezing or shortness of breath.  . ANDROGEL PUMP 20.25 MG/ACT (1.62%) GEL Apply 1 application topically daily.   . Ascorbic Acid (VITAMIN C) 1000 MG tablet Take 1,000 mg by mouth 2 (two) times a day.  . b complex  vitamins capsule Take 1 capsule by mouth daily.  . brinzolamide (AZOPT) 1 % ophthalmic suspension Place 1 drop into both eyes daily.   . bromocriptine (PARLODEL) 2.5 MG tablet Take 5 mg by mouth daily.   . Cinnamon 500 MG capsule Take 1,000 mg by mouth daily.  Marland Kitchen glucosamine-chondroitin 500-400 MG tablet Take 2 tablets by mouth daily.  . Lactobacillus (PROBIOTIC ACIDOPHILUS PO) Take by mouth as directed.  . latanoprost (XALATAN) 0.005 % ophthalmic solution Place 1 drop into both eyes at bedtime.   . Magnesium 500 MG TABS Take 1 tablet by mouth daily. Patient says he takes magnesium orotate  . Omega-3 Fatty Acids (OMEGA ESSENTIALS BASIC) LIQD Take 15 mLs by mouth daily.  Marland Kitchen PARoxetine (PAXIL) 40 MG tablet Take 1 tablet (40 mg total) by mouth daily.  . potassium chloride SA (K-DUR,KLOR-CON) 20 MEQ tablet Take 2 tablets (40 mEq total) by mouth 2 (two) times daily. (Patient taking differently: Take 20 mEq by mouth 2 (two) times daily. )  . quinapril (ACCUPRIL) 20 MG tablet TAKE 1 TABLET BY MOUTH EVERY DAY  . Spacer/Aero-Holding Chambers DEVI 1 Device by Does not apply route 4 (four) times daily.  Marland Kitchen spironolactone (ALDACTONE) 50 MG tablet Take 50 mg by mouth daily.  Marland Kitchen torsemide (DEMADEX) 20 MG tablet Take 3 tablets (60 mg total) by mouth 2 (two) times daily.   No facility-administered encounter medications on file as of 10/17/2018.     Allergies  Allergen Reactions  . Codeine Shortness Of Breath    Review of Systems  Constitutional: Positive for chills.  HENT: Positive for sore throat.   Respiratory: Negative for cough, sputum production and shortness of breath.   Cardiovascular: Negative.   Gastrointestinal: Negative.   Skin: Positive for itching and rash.       Intermittent swelling of lips.    Objective:  There were no vitals taken for this visit.  WDWN male in no apparent distress.  Head: Normocephalic, atraumatic. Neck: Supple, NROM Respiratory: No apparent distress Psych: Normal  mood and affect   Assessment and Plan :  1. Hives Onset of itching red rash with swelling of lips intermittently and in axillae over the past 1-2 days. Was worse yesterday but helped by Benadryl over night. It is returning today. No known exposure to topical allergens. No new medications. Concerned about possible COVID-19 infection. Will schedule for testing and home monitoring. May use Zyrtec or Benadryl prn itching. - MyChart COVID-19 home monitoring program; Future - Novel Coronavirus, NAA ([facility])  [place] testing site only  2. Chills Has not taken temperature but fell chills last night. Schedule for home temperature monitoring. - MyChart COVID-19 home monitoring program; Future - Novel Coronavirus, NAA ([facility])  [place] testing site only - Temperature monitoring; Future  3. Chronic diastolic heart failure (HCC) Continues to take Demadex 20 mg 3  tablets daily and Spironolactone 50 mg qd. States he has gained some weight in the past few days but no significant dyspnea or palpitations. Should restrict salt intake and contact CHF clinic if he gains more than 2 lbs in any 24 hours or 5 lbs a week.  4. Suspected Covid-19 Virus Infection With recent development of rash, sore throat, malaise, weight gain and chills, he is very concerned about COVID-19 infection. At high risk for complications with chronic CHF and Klippel-Feil Syndrome. Will schedule for COVID-19 test and home monitoring. Reminded him to stay isolated at home until test results come back (may take 3-5+ days). - MyChart COVID-19 home monitoring program; Future - Novel Coronavirus, NAA ([facility])  [place] testing site only - Temperature monitoring; Future  I discussed the assessment and treatment plan with the patient. The patient was provided an opportunity to ask questions and all were answered. The patient agreed with the plan and demonstrated an understanding of the instructions.   The patient was advised to call back or  seek an in-person evaluation if the symptoms worsen or if the condition fails to improve as anticipated.  I provided 15 minutes of non-face-to-face time during this encounter.

## 2018-10-20 ENCOUNTER — Telehealth: Payer: Self-pay

## 2018-10-20 DIAGNOSIS — R21 Rash and other nonspecific skin eruption: Secondary | ICD-10-CM

## 2018-10-20 LAB — NOVEL CORONAVIRUS, NAA: SARS-CoV-2, NAA: NOT DETECTED

## 2018-10-20 NOTE — Telephone Encounter (Signed)
Advised patient of results. Patient reports that he still has the rash. He is taking benadryl and applying calamine lotion with minimal relief. Denies spreading. Any other recommendations?

## 2018-10-20 NOTE — Telephone Encounter (Signed)
-----   Message from Margo Common, [place] sent at 10/20/2018 12:42 PM EDT ----- Covid-19 test is negative. Should maintain isolation at home until free of fever for 72 hours without taking Tylenol or Advil. Continue pandemic restrictions of face covering in public, frequent hand washing and social distancing.

## 2018-10-20 NOTE — Telephone Encounter (Signed)
A prednisone 5 mg 6 day taper #21 tablets may help to clear rash. Continue Benadryl as needed.

## 2018-10-24 MED ORDER — PREDNISONE 5 MG PO TABS: ORAL_TABLET | ORAL | 0 refills | Status: DC

## 2018-10-24 NOTE — Telephone Encounter (Signed)
Should consider going ahead with the Prednisone 5 mg 6 day taper to help with remaining rash, AND, the numbness in fingers.

## 2018-10-24 NOTE — Telephone Encounter (Signed)
Patient states that the rash is better and he does not want the medication to be send. Patient states that his fingers on his right hand has been numb for the past week and wants to know what to do. Please advise.

## 2018-10-24 NOTE — Telephone Encounter (Signed)
Patient agreed with taking Prednisone and medication send into pharmacy.

## 2018-12-12 DIAGNOSIS — E785 Hyperlipidemia, unspecified: Secondary | ICD-10-CM | POA: Diagnosis not present

## 2018-12-12 DIAGNOSIS — I10 Essential (primary) hypertension: Secondary | ICD-10-CM | POA: Diagnosis not present

## 2018-12-12 DIAGNOSIS — Q761 Klippel-Feil syndrome: Secondary | ICD-10-CM | POA: Diagnosis not present

## 2018-12-12 DIAGNOSIS — I5031 Acute diastolic (congestive) heart failure: Secondary | ICD-10-CM | POA: Diagnosis not present

## 2018-12-12 DIAGNOSIS — D352 Benign neoplasm of pituitary gland: Secondary | ICD-10-CM | POA: Diagnosis not present

## 2018-12-12 DIAGNOSIS — E221 Hyperprolactinemia: Secondary | ICD-10-CM | POA: Diagnosis not present

## 2018-12-12 DIAGNOSIS — E291 Testicular hypofunction: Secondary | ICD-10-CM | POA: Diagnosis not present

## 2018-12-12 DIAGNOSIS — Q02 Microcephaly: Secondary | ICD-10-CM | POA: Diagnosis not present

## 2018-12-19 DIAGNOSIS — Q761 Klippel-Feil syndrome: Secondary | ICD-10-CM | POA: Diagnosis not present

## 2018-12-19 DIAGNOSIS — Z6841 Body Mass Index (BMI) 40.0 and over, adult: Secondary | ICD-10-CM | POA: Diagnosis not present

## 2018-12-19 DIAGNOSIS — Q02 Microcephaly: Secondary | ICD-10-CM | POA: Diagnosis not present

## 2018-12-19 DIAGNOSIS — F4322 Adjustment disorder with anxiety: Secondary | ICD-10-CM | POA: Diagnosis not present

## 2018-12-19 DIAGNOSIS — E221 Hyperprolactinemia: Secondary | ICD-10-CM | POA: Diagnosis not present

## 2018-12-19 DIAGNOSIS — I5031 Acute diastolic (congestive) heart failure: Secondary | ICD-10-CM | POA: Diagnosis not present

## 2018-12-19 DIAGNOSIS — Q540 Hypospadias, balanic: Secondary | ICD-10-CM | POA: Diagnosis not present

## 2018-12-19 DIAGNOSIS — D352 Benign neoplasm of pituitary gland: Secondary | ICD-10-CM | POA: Diagnosis not present

## 2018-12-19 DIAGNOSIS — I10 Essential (primary) hypertension: Secondary | ICD-10-CM | POA: Diagnosis not present

## 2018-12-19 DIAGNOSIS — E6609 Other obesity due to excess calories: Secondary | ICD-10-CM | POA: Diagnosis not present

## 2018-12-19 DIAGNOSIS — E291 Testicular hypofunction: Secondary | ICD-10-CM | POA: Diagnosis not present

## 2018-12-19 DIAGNOSIS — E785 Hyperlipidemia, unspecified: Secondary | ICD-10-CM | POA: Diagnosis not present

## 2018-12-20 NOTE — Progress Notes (Deleted)
Patient ID: Scott [name], male    DOB: 05-Apr-1971, 48 y.o.   MRN: [id number]  HPI  Scott [name] is a 48 y/o male with a history of hyperlipidemia, HTN, anxiety, arthritis, depression, klippel-feil syndrome, former tobacco use and chronic heart failure.   Echo report from 02/05/18 reviewed and showed an EF of 55-60%  Has not been admitted or been in the ED in the last 6 months.    Scott [name] presents today for a follow-up visit with a chief complaint of   Past Medical History:  Diagnosis Date  . Acid reflux   . Anxiety   . Arthritis   . Asthma   . CHF (congestive heart failure) ([place])   . Depression   . Glaucoma   . Hyperlipidemia   . Hypertension   . Klippel-Feil syndrome    Past Surgical History:  Procedure Laterality Date  . COLOSTOMY    . DEN[place] SHUNT PLACEMENT     Family History  Problem Relation Age of Onset  . Multiple sclerosis Mother   . Colon cancer Father   . Heart disease Father   . Lung cancer Maternal Grandmother   . Thyroid cancer Maternal Grandfather   . Ovarian cancer Paternal Grandmother   . Cancer Paternal Grandfather    Social History   Tobacco Use  . Smoking status: Former Smoker    Packs/day: 1.00    Years: 10.00    Pack years: 10.00    Types: Cigarettes    Quit date: 05/2006    Years since quitting: 12.6  . Smokeless tobacco: Never Used  . Tobacco comment: quit 05/2006  Substance Use Topics  . Alcohol use: No    Alcohol/week: 0.0 standard drinks   Allergies  Allergen Reactions  . Codeine Shortness Of Breath     Review of Systems  Constitutional: Positive for appetite change (decreased) and fatigue. Negative for fever.  HENT: Negative for congestion, postnasal drip and sore throat.   Eyes: Negative.   Respiratory: Positive for shortness of breath and wheezing. Negative for cough and chest tightness.   Cardiovascular: Negative for chest pain, palpitations and leg swelling.  Gastrointestinal: Positive for abdominal distention and nausea. Negative  for abdominal pain.  Endocrine: Negative.   Genitourinary: Positive for dysuria. Negative for hematuria.  Musculoskeletal: Positive for neck pain (better). Negative for arthralgias ()) and back pain.  Skin: Negative.   Allergic/Immunologic: Negative.   Neurological: Negative for dizziness and light-headedness.  Hematological: Negative for adenopathy. Does not bruise/bleed easily.  Psychiatric/Behavioral: Negative for dysphoric mood and sleep disturbance (sleeping on 1 pillow). The patient is not nervous/anxious.      Physical Exam Vitals signs and nursing note reviewed.  Constitutional:      Appearance: Scott [name] is well-developed.  HENT:     Head: Normocephalic and atraumatic.  Neck:     Musculoskeletal: Normal range of motion and neck supple.     Vascular: No JVD.  Cardiovascular:     Rate and Rhythm: Regular rhythm. Tachycardia present.  Pulmonary:     Effort: Pulmonary effort is normal. No respiratory distress.     Breath sounds: No wheezing or rales.  Abdominal:     General: There is distension.     Tenderness: There is no abdominal tenderness.  Musculoskeletal:        General: No tenderness.     Right lower leg: Scott [name] exhibits no tenderness. No edema.     Left lower leg: Scott [name] exhibits no tenderness. No edema.  Skin:  General: Skin is warm and dry.  Neurological:     Mental Status: Scott [name] is alert and oriented to person, place, and time.  Psychiatric:        Behavior: Behavior normal.        Thought Content: Thought content normal.    Assessment & Plan:  1: Chronic heart failure with preserved ejection fraction- - NYHA class III - euvolemic today  - weighing daily; reminded to call for an overnight weight gain of >2 pounds or a weekly weight gain of >5 pounds - weight 203.8 pounds from last visit here 6 months ago - [facility] had previously denied metolazone even with PA - not adding salt and has been reading food labels. Reviewed the importance of closely following a 2000mg   sodium diet - saw cardiology Fletcher Anon) 06/06/2018 - BNP on 05/19/2018 was 26.0   2: HTN- - BP - had tele visit with PCP ([name]) 10/17/2018 - BMP from 06/21/2018 reviewed and showed sodium 142, potassium 3.5, creatinine 1.17 and GFR >60  3: Klippel-Fiel syndrome- - saw pulmonology Patsey Berthold) 01/25/18 - has been dismissed from [facility] pulmonology - encouraged Scott [name] to call PCP to get a referral to another pulmonologist Humphrey Rolls or [place]) - had PFT's 02/21/18 - wears oxygen at 2L PRN  Medication list was reviewed.

## 2018-12-21 ENCOUNTER — Ambulatory Visit: Payer: Medicare Other | Admitting: Family

## 2018-12-25 NOTE — Progress Notes (Deleted)
Patient ID: Scott [name], male    DOB: [birth year], 48 y.o.   MRN: [id number]  HPI  Mr Garden is a 48 y/o male with a history of hyperlipidemia, HTN, anxiety, arthritis, depression, klippel-feil syndrome, former tobacco use and chronic heart failure.   Echo report from 02/05/18 reviewed and showed an EF of 55-60%  Has not been admitted or been in the ED in the last 6 months.    He presents today for a follow-up visit with a chief complaint of   Past Medical History:  Diagnosis Date  . Acid reflux   . Anxiety   . Arthritis   . Asthma   . CHF (congestive heart failure) ([place])   . Depression   . Glaucoma   . Hyperlipidemia   . Hypertension   . Klippel-Feil syndrome    Past Surgical History:  Procedure Laterality Date  . COLOSTOMY    . DENVER SHUNT PLACEMENT     Family History  Problem Relation Age of Onset  . Multiple sclerosis Mother   . Colon cancer Father   . Heart disease Father   . Lung cancer Maternal Grandmother   . Thyroid cancer Maternal Grandfather   . Ovarian cancer Paternal Grandmother   . Cancer Paternal Grandfather    Social History   Tobacco Use  . Smoking status: Former Smoker    Packs/day: 1.00    Years: 10.00    Pack years: 10.00    Types: Cigarettes    Quit date: 05/2006    Years since quitting: 12.6  . Smokeless tobacco: Never Used  . Tobacco comment: quit 05/2006  Substance Use Topics  . Alcohol use: No    Alcohol/week: 0.0 standard drinks   Allergies  Allergen Reactions  . Codeine Shortness Of Breath     Review of Systems  Constitutional: Positive for appetite change (decreased) and fatigue. Negative for fever.  HENT: Negative for congestion, postnasal drip and sore throat.   Eyes: Negative.   Respiratory: Positive for shortness of breath and wheezing. Negative for cough and chest tightness.   Cardiovascular: Negative for chest pain, palpitations and leg swelling.  Gastrointestinal: Positive for abdominal distention and nausea. Negative  for abdominal pain.  Endocrine: Negative.   Genitourinary: Positive for dysuria. Negative for hematuria.  Musculoskeletal: Positive for neck pain (better). Negative for arthralgias ()) and back pain.  Skin: Negative.   Allergic/Immunologic: Negative.   Neurological: Negative for dizziness and light-headedness.  Hematological: Negative for adenopathy. Does not bruise/bleed easily.  Psychiatric/Behavioral: Negative for dysphoric mood and sleep disturbance (sleeping on 1 pillow). The patient is not nervous/anxious.      Physical Exam Vitals signs and nursing note reviewed.  Constitutional:      Appearance: He is well-developed.  HENT:     Head: Normocephalic and atraumatic.  Neck:     Musculoskeletal: Normal range of motion and neck supple.     Vascular: No JVD.  Cardiovascular:     Rate and Rhythm: Regular rhythm. Tachycardia present.  Pulmonary:     Effort: Pulmonary effort is normal. No respiratory distress.     Breath sounds: No wheezing or rales.  Abdominal:     General: There is distension.     Tenderness: There is no abdominal tenderness.  Musculoskeletal:        General: No tenderness.     Right lower leg: He exhibits no tenderness. No edema.     Left lower leg: He exhibits no tenderness. No edema.  Skin:  General: Skin is warm and dry.  Neurological:     Mental Status: He is alert and oriented to person, place, and time.  Psychiatric:        Behavior: Behavior normal.        Thought Content: Thought content normal.    Assessment & Plan:  1: Chronic heart failure with preserved ejection fraction- - NYHA class III - euvolemic today  - weighing daily; reminded to call for an overnight weight gain of >2 pounds or a weekly weight gain of >5 pounds - weight 203.8 pounds from last visit here 6 months ago - [facility] had previously denied metolazone even with PA - not adding salt and has been reading food labels. Reviewed the importance of closely following a 2000mg   sodium diet - saw cardiology Fletcher Anon) 06/06/2018 - BNP on 05/19/2018 was 26.0   2: HTN- - BP - had tele visit with PCP ([name]) 10/17/2018 - BMP from 06/21/2018 reviewed and showed sodium 142, potassium 3.5, creatinine 1.17 and GFR >60  3: Klippel-Fiel syndrome- - saw pulmonology Patsey Berthold) 01/25/18 - has been dismissed from [facility] pulmonology - encouraged him to call PCP to get a referral to another pulmonologist Humphrey Rolls or [place]) - had PFT's 02/21/18 - wears oxygen at 2L PRN  Medication list was reviewed.

## 2018-12-26 ENCOUNTER — Ambulatory Visit: Payer: Medicare Other | Admitting: Family

## 2018-12-27 NOTE — Progress Notes (Deleted)
Patient ID: ISRRAEL REMBERT, male    DOB: 02/26/1971, 48 y.o.   MRN: [id number]  HPI  Mr Hollenshead is a 48 y/o male with a history of hyperlipidemia, HTN, anxiety, arthritis, depression, klippel-feil syndrome, former tobacco use and chronic heart failure.   Echo report from 02/05/18 reviewed and showed an EF of 55-60%  Has not been admitted or been in the ED in the last 6 months.    He presents today for a follow-up visit with a chief complaint of   Past Medical History:  Diagnosis Date  . Acid reflux   . Anxiety   . Arthritis   . Asthma   . CHF (congestive heart failure) ([place])   . Depression   . Glaucoma   . Hyperlipidemia   . Hypertension   . Klippel-Feil syndrome    Past Surgical History:  Procedure Laterality Date  . COLOSTOMY    . DENVER SHUNT PLACEMENT     Family History  Problem Relation Age of Onset  . Multiple sclerosis Mother   . Colon cancer Father   . Heart disease Father   . Lung cancer Maternal Grandmother   . Thyroid cancer Maternal Grandfather   . Ovarian cancer Paternal Grandmother   . Cancer Paternal Grandfather    Social History   Tobacco Use  . Smoking status: Former Smoker    Packs/day: 1.00    Years: 10.00    Pack years: 10.00    Types: Cigarettes    Quit date: 05/2006    Years since quitting: 12.6  . Smokeless tobacco: Never Used  . Tobacco comment: quit 05/2006  Substance Use Topics  . Alcohol use: No    Alcohol/week: 0.0 standard drinks   Allergies  Allergen Reactions  . Codeine Shortness Of Breath     Review of Systems  Constitutional: Positive for appetite change (decreased) and fatigue. Negative for fever.  HENT: Negative for congestion, postnasal drip and sore throat.   Eyes: Negative.   Respiratory: Positive for shortness of breath and wheezing. Negative for cough and chest tightness.   Cardiovascular: Negative for chest pain, palpitations and leg swelling.  Gastrointestinal: Positive for abdominal distention and nausea. Negative  for abdominal pain.  Endocrine: Negative.   Genitourinary: Positive for dysuria. Negative for hematuria.  Musculoskeletal: Positive for neck pain (better). Negative for arthralgias ()) and back pain.  Skin: Negative.   Allergic/Immunologic: Negative.   Neurological: Negative for dizziness and light-headedness.  Hematological: Negative for adenopathy. Does not bruise/bleed easily.  Psychiatric/Behavioral: Negative for dysphoric mood and sleep disturbance (sleeping on 1 pillow). The patient is not nervous/anxious.      Physical Exam Vitals signs and nursing note reviewed.  Constitutional:      Appearance: He is well-developed.  HENT:     Head: Normocephalic and atraumatic.  Neck:     Musculoskeletal: Normal range of motion and neck supple.     Vascular: No JVD.  Cardiovascular:     Rate and Rhythm: Regular rhythm. Tachycardia present.  Pulmonary:     Effort: Pulmonary effort is normal. No respiratory distress.     Breath sounds: No wheezing or rales.  Abdominal:     General: There is distension.     Tenderness: There is no abdominal tenderness.  Musculoskeletal:        General: No tenderness.     Right lower leg: He exhibits no tenderness. No edema.     Left lower leg: He exhibits no tenderness. No edema.  Skin:  General: Skin is warm and dry.  Neurological:     Mental Status: He is alert and oriented to person, place, and time.  Psychiatric:        Behavior: Behavior normal.        Thought Content: Thought content normal.    Assessment & Plan:  1: Chronic heart failure with preserved ejection fraction- - NYHA class III - euvolemic today  - weighing daily; reminded to call for an overnight weight gain of >2 pounds or a weekly weight gain of >5 pounds - weight 203.8 pounds from last visit here 6 months ago - [facility] had previously denied metolazone even with PA - not adding salt and has been reading food labels. Reviewed the importance of closely following a 2000mg   sodium diet - saw cardiology Fletcher Anon) 06/06/2018 - BNP on 05/19/2018 was 26.0   2: HTN- - BP - had tele visit with PCP ([name]) 10/17/2018 - BMP from 06/21/2018 reviewed and showed sodium 142, potassium 3.5, creatinine 1.17 and GFR >60  3: Klippel-Fiel syndrome- - saw pulmonology Patsey Berthold) 01/25/18 - has been dismissed from [facility] pulmonology - encouraged him to call PCP to get a referral to another pulmonologist Humphrey Rolls or [place]) - had PFT's 02/21/18 - wears oxygen at 2L PRN  Medication list was reviewed.

## 2018-12-28 ENCOUNTER — Ambulatory Visit: Payer: Medicare Other | Admitting: Family

## 2019-02-20 DIAGNOSIS — I5031 Acute diastolic (congestive) heart failure: Secondary | ICD-10-CM | POA: Diagnosis not present

## 2019-02-20 DIAGNOSIS — R7301 Impaired fasting glucose: Secondary | ICD-10-CM | POA: Diagnosis not present

## 2019-02-20 DIAGNOSIS — E221 Hyperprolactinemia: Secondary | ICD-10-CM | POA: Diagnosis not present

## 2019-02-20 DIAGNOSIS — Q02 Microcephaly: Secondary | ICD-10-CM | POA: Diagnosis not present

## 2019-02-20 DIAGNOSIS — Q761 Klippel-Feil syndrome: Secondary | ICD-10-CM | POA: Diagnosis not present

## 2019-02-20 DIAGNOSIS — E291 Testicular hypofunction: Secondary | ICD-10-CM | POA: Diagnosis not present

## 2019-02-20 DIAGNOSIS — D352 Benign neoplasm of pituitary gland: Secondary | ICD-10-CM | POA: Diagnosis not present

## 2019-02-20 DIAGNOSIS — E785 Hyperlipidemia, unspecified: Secondary | ICD-10-CM | POA: Diagnosis not present

## 2019-03-18 ENCOUNTER — Other Ambulatory Visit: Payer: Self-pay | Admitting: Family Medicine

## 2019-04-11 NOTE — Progress Notes (Addendum)
Subjective:   Scott [name] is a 49 y.o. male who presents for Medicare Annual/Subsequent preventive examination.    This visit is being conducted through telemedicine due to the COVID-19 pandemic. This patient has given me verbal consent via doximity to conduct this visit, patient states they are participating from their home address. Some vital signs may be absent or patient reported.    Patient identification: identified by name, DOB, and current address  Review of Systems:  N/A  Cardiac Risk Factors include: hypertension;male gender     Objective:    Vitals: There were no vitals taken for this visit.  There is no height or weight on file to calculate BMI. Unable to obtain vitals due to visit being conducted via telephonically.   Advanced Directives 04/12/2019 04/10/2018 02/05/2018 02/04/2018 11/07/2014  Does Patient Have a Medical Advance Directive? No No No No No  Would patient like information on creating a medical advance directive? No - Patient declined - No - Patient declined - -    Tobacco Social History   Tobacco Use  Smoking Status Former Smoker   Packs/day: 1.00   Years: 10.00   Pack years: 10.00   Types: Cigarettes   Quit date: 05/2006   Years since quitting: 12.9  Smokeless Tobacco Never Used  Tobacco Comment   quit 05/2006     Counseling given: Not Answered Comment: quit 05/2006   Clinical Intake:  Pre-visit preparation completed: Yes  Pain : 0-10 Pain Score: 1  Pain Location: Chest(Along with runny nose and congestion. Pt believes this is due to Radon gas.) Pain Orientation: Left Pain Descriptors / Indicators: Aching Pain Onset: More than a month ago Pain Frequency: Intermittent     Nutritional Risks: None Diabetes: No  How often do you need to have someone help you when you read instructions, pamphlets, or other written materials from your doctor or pharmacy?: 1 - Never  Interpreter Needed?: No  Information entered by :: Mmarkoski, LPN  Past  Medical History:  Diagnosis Date   Acid reflux    Anxiety    Arthritis    Asthma    CHF (congestive heart failure) ([place])    Depression    Glaucoma    Hyperlipidemia    Hypertension    Klippel-Feil syndrome    Past Surgical History:  Procedure Laterality Date   COLOSTOMY     DENVER SHUNT PLACEMENT     Family History  Problem Relation Age of Onset   Multiple sclerosis Mother    Colon cancer Father    Heart disease Father    Lung cancer Maternal Grandmother    Thyroid cancer Maternal Grandfather    Ovarian cancer Paternal Grandmother    Cancer Paternal Grandfather    Social History   Socioeconomic History   Marital status: Single    Spouse name: Not on file   Number of children: 0   Years of education: Not on file   Highest education level: Some college, no degree  Occupational History   Occupation: disability  Tobacco Use   Smoking status: Former Smoker    Packs/day: 1.00    Years: 10.00    Pack years: 10.00    Types: Cigarettes    Quit date: 05/2006    Years since quitting: 12.9   Smokeless tobacco: Never Used   Tobacco comment: quit 05/2006  Substance and Sexual Activity   Alcohol use: No    Alcohol/week: 0.0 standard drinks   Drug use: No   Sexual  activity: Not on file  Other Topics Concern   Not on file  Social History Narrative   Not on file   Social Determinants of Health   Financial Resource Strain: Medium Risk   Difficulty of Paying Living Expenses: Somewhat hard  Food Insecurity: No Food Insecurity   Worried About Running Out of Food in the Last Year: Never true   Ran Out of Food in the Last Year: Never true  Transportation Needs: No Transportation Needs   Lack of Transportation (Medical): No   Lack of Transportation (Non-Medical): No  Physical Activity: Inactive   Days of Exercise per Week: 0 days   Minutes of Exercise per Session: 0 min  Stress: Stress Concern Present   Feeling of Stress : Very much  Social Connections: Somewhat  Isolated   Frequency of Communication with Friends and Family: Three times a week   Frequency of Social Gatherings with Friends and Family: Never   Attends Religious Services: Never   Marine scientist or Organizations: No   Attends Archivist Meetings: Never   Marital Status: Living with partner    Outpatient Encounter Medications as of 04/12/2019  Medication Sig   albuterol (PROVENTIL HFA;VENTOLIN HFA) 108 (90 Base) MCG/ACT inhaler Inhale 2 puffs into the lungs every 4 (four) hours as needed for wheezing or shortness of breath.   ANDROGEL PUMP 20.25 MG/ACT (1.62%) GEL Apply 1 application topically daily.    Ascorbic Acid (VITAMIN C) 1000 MG tablet Take 1,000 mg by mouth 2 (two) times a day.   b complex vitamins capsule Take 1 capsule by mouth daily.   brinzolamide (AZOPT) 1 % ophthalmic suspension Place 1 drop into both eyes daily.    bromocriptine (PARLODEL) 2.5 MG tablet Take 5 mg by mouth daily.    glucosamine-chondroitin 500-400 MG tablet Take 2 tablets by mouth daily.   Lactobacillus (PROBIOTIC ACIDOPHILUS PO) Take by mouth daily.    latanoprost (XALATAN) 0.005 % ophthalmic solution Place 1 drop into both eyes at bedtime.    Magnesium 500 MG TABS Take 1 tablet by mouth daily. Patient says he takes magnesium orotate   Omega-3 Fatty Acids (OMEGA ESSENTIALS BASIC) LIQD Take 15 mLs by mouth daily.   PARoxetine (PAXIL) 40 MG tablet Take 1 tablet (40 mg total) by mouth daily.   potassium chloride SA (K-DUR,KLOR-CON) 20 MEQ tablet Take 2 tablets (40 mEq total) by mouth 2 (two) times daily.   quinapril (ACCUPRIL) 20 MG tablet TAKE 1 TABLET BY MOUTH EVERY DAY   Spacer/Aero-Holding Chambers DEVI 1 Device by Does not apply route 4 (four) times daily.   spironolactone (ALDACTONE) 50 MG tablet Take 50 mg by mouth daily.   torsemide (DEMADEX) 20 MG tablet Take 3 tablets (60 mg total) by mouth 2 (two) times daily.   Cinnamon 500 MG capsule Take 1,000 mg by mouth daily.   predniSONE  (DELTASONE) 5 MG tablet Take 6 days taper (Patient not taking: Reported on 04/12/2019)   No facility-administered encounter medications on file as of 04/12/2019.    Activities of Daily Living In your present state of health, do you have any difficulty performing the following activities: 04/12/2019 06/21/2018  Hearing? Y N  Comment Has trouble hearing out of right ear. -  Vision? N N  Difficulty concentrating or making decisions? N N  Walking or climbing stairs? Y Y  Comment Due to SOB. -  Dressing or bathing? N N  Doing errands, shopping? N N  Preparing Food and eating ?  N -  Using the Toilet? N -  In the past six months, have you accidently leaked urine? N -  Do you have problems with loss of bowel control? N -  Managing your Medications? N -  Managing your Finances? N -  Housekeeping or managing your Housekeeping? N -  Some recent data might be hidden    Patient Care Team: [name], [name], PA as PCP - General (Physician Assistant) Alisa Graff, FNP (Family Medicine) Ronnald Collum, Lourdes Sledge, MD as Attending Physician (Endocrinology)   Assessment:   This is a routine wellness examination for [place].  Exercise Activities and Dietary recommendations Current Exercise Habits: The patient does not participate in regular exercise at present, Exercise limited by: None identified  Goals      DIET - INCREASE LEAN PROTEINS     Recommend to continue current diet plan of cutting out salt and fatty meats and focus on eating all lean meats.         Fall Risk: Fall Risk  04/12/2019 06/21/2018 05/19/2018 05/10/2018 04/19/2018  Falls in the past year? 0 0 1 0 1  Number falls in past yr: 0 0 0 0 0  Injury with Fall? 0 0 0 0 0  Risk for fall due to : - - - - -  Risk for fall due to: Comment - - - - -  Follow up - - - - -    [place]:  Any stairs in or around the home? No  If so, are there any without handrails?  N/A  Home free of loose throw rugs in  walkways, pet beds, electrical cords, etc? Yes  Adequate lighting in your home to reduce risk of falls? Yes   ASSISTIVE DEVICES UTILIZED TO PREVENT FALLS:  Life alert? No  Use of a cane, walker or w/c? No  Grab bars in the bathroom? No  Shower chair or bench in shower? No  Elevated toilet seat or a handicapped toilet? No   TIMED UP AND GO:  Was the test performed? No .    Depression Screen PHQ 2/9 Scores 04/12/2019 04/10/2018 02/20/2018 02/20/2018  PHQ - 2 Score 6 2 2 2   PHQ- 9 Score 18 6 2  -  Exception Documentation - - Other- indicate reason in comment box -  Not completed - - (No Data) -    Cognitive Function: Declined today.     6CIT Screen 04/10/2018  What Year? 0 points  What month? 0 points  What time? 0 points  Count back from 20 0 points  Months in reverse 0 points  Repeat phrase 0 points  Total Score 0    Immunization History  Administered Date(s) Administered   Tdap 02/20/2018    Qualifies for Shingles Vaccine? No    Tdap: Up to date  Flu Vaccine: Due for Flu vaccine. Does the patient want to receive this vaccine today?  No . Advised may receive this vaccine at local pharmacy or Health Dept. Aware to provide a copy of the vaccination record if obtained from local pharmacy or Health Dept. Verbalized acceptance and understanding.   Screening Tests Health Maintenance  Topic Date Due   INFLUENZA VACCINE  07/04/2019 (Originally 11/04/2018)   TETANUS/TDAP  02/21/2028   HIV Screening  Completed   Cancer Screenings:  Lung Cancer Screening: (Low Dose CT Chest recommended if Age 38-80 years, 30 pack-year currently smoking OR have quit w/in 15years.) does not qualify.   Additional Screening:  Vision Screening: Recommended annual ophthalmology exams for early detection of glaucoma and other disorders of the eye.  Dental Screening: Recommended annual dental exams for proper oral hygiene  Community Resource Referral:  CRR required this visit? Yes, pt is  needing assistance moving furniture in his home due to [employer]. Also pt is concerned about a roach infestation. Referral sent.      Plan:  I have personally reviewed and addressed the Medicare Annual Wellness questionnaire and have noted the following in the patient's chart:  A. Medical and social history B. Use of alcohol, tobacco or illicit drugs  C. Current medications and supplements D. Functional ability and status E.  Nutritional status F.  Physical activity G. Advance directives H. List of other physicians I.  Hospitalizations, surgeries, and ER visits in previous 12 months J.  [place] such as hearing and vision if needed, cognitive and depression L. Referrals and appointments   In addition, I have reviewed and discussed with patient certain preventive protocols, quality metrics, and best practice recommendations. A written personalized care plan for preventive services as well as general preventive health recommendations were provided to patient.   [place]ora Score, [place]  [id number] Nurse Health Advisor   Nurse Notes: Pt failed depression screening today due to current roast infestation at home. Referral sent to [facility] team to see about assistance. Advised pt to speak with landlord ASAP. Pt is also wanting referrals for speech and another cardiologist. Scheduled a f/u apt on 05/14/19 @ 1:20 PM.   Reviewed note and plan of Nurse Health Advisor. Agree with documentation and plan.

## 2019-04-12 ENCOUNTER — Other Ambulatory Visit: Payer: Self-pay

## 2019-04-12 ENCOUNTER — Ambulatory Visit (INDEPENDENT_AMBULATORY_CARE_PROVIDER_SITE_OTHER): Payer: Medicare Other

## 2019-04-12 DIAGNOSIS — F419 Anxiety disorder, unspecified: Secondary | ICD-10-CM | POA: Diagnosis not present

## 2019-04-12 DIAGNOSIS — F32A Depression, unspecified: Secondary | ICD-10-CM

## 2019-04-12 DIAGNOSIS — Z0000 Encounter for general adult medical examination without abnormal findings: Secondary | ICD-10-CM

## 2019-04-12 DIAGNOSIS — F329 Major depressive disorder, single episode, unspecified: Secondary | ICD-10-CM | POA: Diagnosis not present

## 2019-04-12 NOTE — Patient Instructions (Signed)
[name] , Thank you for taking time to come for your Medicare Wellness Visit. I appreciate your ongoing commitment to your health goals. Please review the following plan we discussed and let me know if I can assist you in the future.   Screening recommendations/referrals: Recommended yearly ophthalmology/optometry visit for glaucoma screening and checkup Recommended yearly dental visit for hygiene and checkup  Vaccinations: Influenza vaccine: Pt declines today.  Tdap vaccine: Up to date, due 02/2028    Advanced directives: Advance directive discussed with you today. Even though you declined this today please call our office should you change your mind and we can give you the proper paperwork for you to fill out.  Conditions/risks identified: Recommend to continue to cut back on high sodium foods and focus on eating lean meats.   Next appointment: 05/14/19 @ 1:20 PM with Simona Huh [name].   Preventive Care 40-64 Years, Male Preventive care refers to lifestyle choices and visits with your health care provider that can promote health and wellness. What does preventive care include?  A yearly physical exam. This is also called an annual well check.  Dental exams once or twice a year.  Routine eye exams. Ask your health care provider how often you should have your eyes checked.  Personal lifestyle choices, including:  Daily care of your teeth and gums.  Regular physical activity.  Eating a healthy diet.  Avoiding tobacco and drug use.  Limiting alcohol use.  Practicing safe sex.  Taking low-dose aspirin every day starting at age 66. What happens during an annual well check? The services and screenings done by your health care provider during your annual well check will depend on your age, overall health, lifestyle risk factors, and family history of disease. Counseling  Your health care provider may ask you questions about your:  Alcohol use.  Tobacco use.  Drug  use.  Emotional well-being.  Home and relationship well-being.  Sexual activity.  Eating habits.  Work and work Statistician. Screening  You may have the following tests or measurements:  Height, weight, and BMI.  Blood pressure.  Lipid and cholesterol levels. These may be checked every 5 years, or more frequently if you are over 46 years old.  Skin check.  Lung cancer screening. You may have this screening every year starting at age 53 if you have a 30-pack-year history of smoking and currently smoke or have quit within the past 15 years.  Fecal occult blood test (FOBT) of the stool. You may have this test every year starting at age 51.  Flexible sigmoidoscopy or colonoscopy. You may have a sigmoidoscopy every 5 years or a colonoscopy every 10 years starting at age 80.  Prostate cancer screening. Recommendations will vary depending on your family history and other risks.  Hepatitis C blood test.  Hepatitis B blood test.  Sexually transmitted disease (STD) testing.  Diabetes screening. This is done by checking your blood sugar (glucose) after you have not eaten for a while (fasting). You may have this done every 1-3 years. Discuss your test results, treatment options, and if necessary, the need for more tests with your health care provider. Vaccines  Your health care provider may recommend certain vaccines, such as:  Influenza vaccine. This is recommended every year.  Tetanus, diphtheria, and acellular pertussis (Tdap, Td) vaccine. You may need a Td booster every 10 years.  Zoster vaccine. You may need this after age 79.  Pneumococcal 13-valent conjugate (PCV13) vaccine. You may need this if you  have certain conditions and have not been vaccinated.  Pneumococcal polysaccharide (PPSV23) vaccine. You may need one or two doses if you smoke cigarettes or if you have certain conditions. Talk to your health care provider about which screenings and vaccines you need and how  often you need them. This information is not intended to replace advice given to you by your health care provider. Make sure you discuss any questions you have with your health care provider. Document Released: 04/18/2015 Document Revised: 12/10/2015 Document Reviewed: 01/21/2015 Elsevier Interactive Patient Education  2017 [place] Prevention in the Home Falls can cause injuries. They can happen to people of all ages. There are many things you can do to make your home safe and to help prevent falls. What can I do on the outside of my home?  Regularly fix the edges of walkways and driveways and fix any cracks.  Remove anything that might make you trip as you walk through a door, such as a raised step or threshold.  Trim any bushes or trees on the path to your home.  Use bright outdoor lighting.  Clear any walking paths of anything that might make someone trip, such as rocks or tools.  Regularly check to see if handrails are loose or broken. Make sure that both sides of any steps have handrails.  Any raised decks and porches should have guardrails on the edges.  Have any leaves, snow, or ice cleared regularly.  Use sand or salt on walking paths during winter.  Clean up any spills in your garage right away. This includes oil or grease spills. What can I do in the bathroom?  Use night lights.  Install grab bars by the toilet and in the tub and shower. Do not use towel bars as grab bars.  Use non-skid mats or decals in the tub or shower.  If you need to sit down in the shower, use a plastic, non-slip stool.  Keep the floor dry. Clean up any water that spills on the floor as soon as it happens.  Remove soap buildup in the tub or shower regularly.  Attach bath mats securely with double-sided non-slip rug tape.  Do not have throw rugs and other things on the floor that can make you trip. What can I do in the bedroom?  Use night lights.  Make sure that you have a  light by your bed that is easy to reach.  Do not use any sheets or blankets that are too big for your bed. They should not hang down onto the floor.  Have a firm chair that has side arms. You can use this for support while you get dressed.  Do not have throw rugs and other things on the floor that can make you trip. What can I do in the kitchen?  Clean up any spills right away.  Avoid walking on wet floors.  Keep items that you use a lot in easy-to-reach places.  If you need to reach something above you, use a strong step stool that has a grab bar.  Keep electrical cords out of the way.  Do not use floor polish or wax that makes floors slippery. If you must use wax, use non-skid floor wax.  Do not have throw rugs and other things on the floor that can make you trip. What can I do with my stairs?  Do not leave any items on the stairs.  Make sure that there are handrails on both sides  of the stairs and use them. Fix handrails that are broken or loose. Make sure that handrails are as long as the stairways.  Check any carpeting to make sure that it is firmly attached to the stairs. Fix any carpet that is loose or worn.  Avoid having throw rugs at the top or bottom of the stairs. If you do have throw rugs, attach them to the floor with carpet tape.  Make sure that you have a light switch at the top of the stairs and the bottom of the stairs. If you do not have them, ask someone to add them for you. What else can I do to help prevent falls?  Wear shoes that:  Do not have high heels.  Have rubber bottoms.  Are comfortable and fit you well.  Are closed at the toe. Do not wear sandals.  If you use a stepladder:  Make sure that it is fully opened. Do not climb a closed stepladder.  Make sure that both sides of the stepladder are locked into place.  Ask someone to hold it for you, if possible.  Clearly mark and make sure that you can see:  Any grab bars or  handrails.  First and last steps.  Where the edge of each step is.  Use tools that help you move around (mobility aids) if they are needed. These include:  Canes.  Walkers.  Scooters.  Crutches.  Turn on the lights when you go into a dark area. Replace any light bulbs as soon as they burn out.  Set up your furniture so you have a clear path. Avoid moving your furniture around.  If any of your floors are uneven, fix them.  If there are any pets around you, be aware of where they are.  Review your medicines with your doctor. Some medicines can make you feel dizzy. This can increase your chance of falling. Ask your doctor what other things that you can do to help prevent falls. This information is not intended to replace advice given to you by your health care provider. Make sure you discuss any questions you have with your health care provider. Document Released: 01/16/2009 Document Revised: 08/28/2015 Document Reviewed: 04/26/2014 Elsevier Interactive Patient Education  2017 [employer].

## 2019-04-17 ENCOUNTER — Telehealth: Payer: Self-pay

## 2019-04-17 NOTE — Telephone Encounter (Signed)
04/17/2019 Spoke with patient about possible resources for moving furniture [employer] [place] most charities are not sending volunteers into the community due to [employer]. Ambrose Mantle [phone]

## 2019-04-20 ENCOUNTER — Ambulatory Visit (INDEPENDENT_AMBULATORY_CARE_PROVIDER_SITE_OTHER): Payer: Medicare Other | Admitting: *Deleted

## 2019-04-20 DIAGNOSIS — F329 Major depressive disorder, single episode, unspecified: Secondary | ICD-10-CM

## 2019-04-20 DIAGNOSIS — F419 Anxiety disorder, unspecified: Secondary | ICD-10-CM

## 2019-04-20 DIAGNOSIS — F32A Depression, unspecified: Secondary | ICD-10-CM

## 2019-04-21 NOTE — Patient Instructions (Addendum)
Thank you allowing the Chronic Care Management Team to be a part of your care! It was a pleasure speaking with you today!  1. Please follow up with your [place] lord in regards to treating your home for insects.  CCM (Chronic Care Management) Team   Neldon Labella RN, BSN Nurse Care Coordinator  [phone]  Ruben Reason PharmD  Clinical Pharmacist  [phone]   [name], LCSW Clinical Social Worker [phone]  Goals Addressed            This Visit's Progress   . "I have a huge roach problem" (pt-stated)       Current Barriers:  . Patient has a roach infestation in his home   Clinical Social Work Clinical Goal(s):  Marland Kitchen Over the next 90 days, patient will work with the landlord  to address needs related to treating the roach infestation in his home.  Interventions: . Patient interviewed and appropriate assessments performed . Confirmed that he had not discussed roach problem with his landlord . Provided patient with information about possible disease that roaches cause that may affect his health and the importance of discussing this issue with his landlord . Discussed need to clear the clutter before the house is treated for roaches . Suggested hiring a private duty agency to assist with the household clutter to prepare for the spraying  . Discussed plans with patient for ongoing care management follow up and provided patient with direct contact information for care management team . Collaborated with patient's landlord Raquel Sarna [phone] (community agency) re: treating the home for roaches and will call someone out to spray once clutter is removed the home  Patient Self Care Activities:  . Patient verbalizes understanding of plan to work with the landlord regarding roach problem, however is reluctant to contact the lanldord on his own . Calls provider office for new concerns or questions . Reluctance to contact lanldord on his own regarding roach  infestation  Initial goal documentation         The patient verbalized understanding of instructions provided today and declined a print copy of patient instruction materials.   Telephone follow up appointment with care management team member scheduled for: 04/30/19

## 2019-04-21 NOTE — Chronic Care Management (AMB) (Signed)
Chronic Care Management    Clinical Social Work General Note  04/21/2019 Name: Scott [name] MRN: [id number] DOB: 1970/07/08  Scott [name] is a 49 y.o. year old male who is a primary care patient of [name], [name], [place]. The CCM was consulted to assist the patient with Mental Health Counseling and Resources.   Mr. Mcclafferty was given information about Chronic Care Management services today including:  1. CCM service includes personalized support from designated clinical staff supervised by his physician, including individualized plan of care and coordination with other care providers 2. 24/7 contact phone numbers for assistance for urgent and routine care needs. 3. Service will only be billed when office clinical staff spend 20 minutes or more in a month to coordinate care. 4. Only one practitioner may furnish and bill the service in a calendar month. 5. The patient may stop CCM services at any time (effective at the end of the month) by phone call to the office staff. 6. The patient will be responsible for cost sharing (co-pay) of up to 20% of the service fee (after annual deductible is met).  Patient agreed to services and verbal consent obtained.   Review of patient status, including review of consultants reports, relevant laboratory and other test results, and collaboration with appropriate care team members and the patient's provider was performed as part of comprehensive patient evaluation and provision of chronic care management services.    SDOH (Social Determinants of Health) screening performed today. See Care Plan Entry related to challenges with: Housing  Depression   Stress  Outpatient Encounter Medications as of 04/20/2019  Medication Sig  . albuterol (PROVENTIL HFA;VENTOLIN HFA) 108 (90 Base) MCG/ACT inhaler Inhale 2 puffs into the lungs every 4 (four) hours as needed for wheezing or shortness of breath.  . ANDROGEL PUMP 20.25 MG/ACT (1.62%) GEL Apply 1 application topically daily.     . Ascorbic Acid (VITAMIN C) 1000 MG tablet Take 1,000 mg by mouth 2 (two) times a day.  . b complex vitamins capsule Take 1 capsule by mouth daily.  . brinzolamide (AZOPT) 1 % ophthalmic suspension Place 1 drop into both eyes daily.   . bromocriptine (PARLODEL) 2.5 MG tablet Take 5 mg by mouth daily.   . Cinnamon 500 MG capsule Take 1,000 mg by mouth daily.  Marland Kitchen glucosamine-chondroitin 500-400 MG tablet Take 2 tablets by mouth daily.  . Lactobacillus (PROBIOTIC ACIDOPHILUS PO) Take by mouth daily.   Marland Kitchen latanoprost (XALATAN) 0.005 % ophthalmic solution Place 1 drop into both eyes at bedtime.   . Magnesium 500 MG TABS Take 1 tablet by mouth daily. Patient says he takes magnesium orotate  . Omega-3 Fatty Acids (OMEGA ESSENTIALS BASIC) LIQD Take 15 mLs by mouth daily.  Marland Kitchen PARoxetine (PAXIL) 40 MG tablet Take 1 tablet (40 mg total) by mouth daily.  . predniSONE (DELTASONE) 5 MG tablet Take 6 days taper (Patient not taking: Reported on 04/12/2019)  . quinapril (ACCUPRIL) 20 MG tablet TAKE 1 TABLET BY MOUTH EVERY DAY  . Spacer/Aero-Holding Chambers DEVI 1 Device by Does not apply route 4 (four) times daily.  Marland Kitchen spironolactone (ALDACTONE) 50 MG tablet Take 50 mg by mouth daily.   No facility-administered encounter medications on file as of 04/20/2019.    Goals Addressed            This Visit's Progress   . "I have a huge roach problem" (pt-stated)       Current Barriers:  . Patient has a roach  infestation in his home   Clinical Social Work Clinical Goal(s):  Marland Kitchen Over the next 90 days, patient will work with the landlord  to address needs related to treating the roach infestation in his home.  Interventions: . Patient interviewed and appropriate assessments performed . Confirmed that he had not discussed roach problem with his landlord . Provided patient with information about possible disease that roaches cause that may affect his health and the importance of discussing this issue with his  landlord . Discussed need to clear the clutter before the house is treated for roaches . Suggested hiring a private duty agency to assist with the household clutter to prepare for the spraying  . Discussed plans with patient for ongoing care management follow up and provided patient with direct contact information for care management team . Collaborated with patient's landlord Raquel Sarna [phone] (community agency) re: treating the home for roaches and will call someone out to spray once clutter is removed the home  Patient Self Care Activities:  . Patient verbalizes understanding of plan to work with the landlord regarding roach problem, however is reluctant to contact the lanldord on his own . Calls provider office for new concerns or questions . Reluctance to contact lanldord on his own regarding roach infestation  Initial goal documentation         Follow Up Plan: SW will follow up with patient by phone over the next 2 weeks regarding treatment of his home for [facility], [place] Worker  [place] Management [phone]

## 2019-04-29 ENCOUNTER — Telehealth: Payer: Self-pay

## 2019-04-30 ENCOUNTER — Telehealth: Payer: Self-pay

## 2019-04-30 ENCOUNTER — Ambulatory Visit: Payer: Self-pay | Admitting: *Deleted

## 2019-04-30 NOTE — Chronic Care Management (AMB) (Signed)
   Care Management   Unsuccessful Call Note 04/30/2019 Name: Scott [name] MRN: [id number] DOB: 1970/06/13  Patient  is a 49 year old male who sees [name], [place]  for primary care. [name], PA asked the CCM team to consult the patient for Mental health Counseling and Resources.      This social worker was unable to reach patient via telephone today for follow up call. I have left HIPAA compliant voicemail asking patient to return my call. (unsuccessful outreach #1).   Plan: Will follow-up within 7 business days via telephone.      [name], Portal Worker  [facility] Care Management [phone]

## 2019-05-07 ENCOUNTER — Telehealth: Payer: Self-pay

## 2019-05-07 ENCOUNTER — Ambulatory Visit: Payer: Medicare Other | Admitting: *Deleted

## 2019-05-07 DIAGNOSIS — F329 Major depressive disorder, single episode, unspecified: Secondary | ICD-10-CM

## 2019-05-07 DIAGNOSIS — F418 Other specified anxiety disorders: Secondary | ICD-10-CM

## 2019-05-07 DIAGNOSIS — F32A Depression, unspecified: Secondary | ICD-10-CM

## 2019-05-07 NOTE — Telephone Encounter (Signed)
Copied from [place] [id number]. Topic: Referral - Status >> May 07, 2019  [id number] PM Simone Curia D wrote: [id number] Spoke with patient.  He was able to contact resources and get assistance with moving furniture.  Scott [name] [phone]

## 2019-05-07 NOTE — Patient Instructions (Signed)
Thank you allowing the Chronic Care Management Team to be a part of your care! It was a pleasure speaking with you today!  1. Please call this social worker with any questions or concerns regarding your community resource or mental health needs.  CCM (Chronic Care Management) Team   Neldon Labella  RN, BSN Nurse Care Coordinator  [phone]  Ruben Reason PharmD  Clinical Pharmacist  [phone]   [name], LCSW Clinical Social Worker [phone]  Goals Addressed            This Visit's Progress   . "I have a huge roach problem" (pt-stated)       Current Barriers:  . Patient has a roach infestation in his home   Clinical Social Work Clinical Goal(s):  Marland Kitchen Over the next 90 days, patient will work with the landlord  to address needs related to treating the roach infestation in his home.  Interventions: . Patient interviewed and appropriate assessments performed . Confirmed that he is feeling much better due to conversation and plan made with landlord to address the roach problem in his home . Confirmed that his best friend came down from [place] and helped get his home cleaned up, clutter removed and re-arranged to prepare for the extermination . Confirmed that patient is feeling like his home is "livable" again . Positive reinforcement provided for progress made to prepare for extermination . Discussed need to follow with a mental health therapist for ongoing mental health support . Discussed follow up with [place], [employer] or the [employer]  . Discussed plans with patient to follow up with one of the agencies listed above for ongoing mental health follow up. This Education officer, museum provided patient with direct contact information for care management team to contact if needed in the future   Patient Self Care Activities:  . Patient verbalizes understanding of plan to work with the landlord regarding roach problem, however is reluctant to contact  the lanldord on his own . Calls provider office for new concerns or questions . Reluctance to contact lanldord on his own regarding roach infestation  Please see past updates related to this goal by clicking on the "Past Updates" button in the selected goal          The patient verbalized understanding of instructions provided today and declined a print copy of patient instruction materials.   No further follow up required: patient will call this social worker if needed in the future

## 2019-05-07 NOTE — Chronic Care Management (AMB) (Signed)
Chronic Care Management    Clinical Social Work Follow Up Note  05/07/2019 Name: Scott [name] MRN: [id number] DOB: [birth year]  Scott [name] is a 49 y.o. year old male who is a primary care patient of [name], [name], [place]. The CCM team was consulted for assistance with Mental Health Counseling and Resources.   Review of patient status, including review of consultants reports, other relevant assessments, and collaboration with appropriate care team members and the patient's provider was performed as part of comprehensive patient evaluation and provision of chronic care management services.    Advanced Directives Status: <no information> See Care Plan for related entries.   Outpatient Encounter Medications as of 05/07/2019  Medication Sig  . albuterol (PROVENTIL HFA;VENTOLIN HFA) 108 (90 Base) MCG/ACT inhaler Inhale 2 puffs into the lungs every 4 (four) hours as needed for wheezing or shortness of breath.  . ANDROGEL PUMP 20.25 MG/ACT (1.62%) GEL Apply 1 application topically daily.   . Ascorbic Acid (VITAMIN C) 1000 MG tablet Take 1,000 mg by mouth 2 (two) times a day.  . b complex vitamins capsule Take 1 capsule by mouth daily.  . brinzolamide (AZOPT) 1 % ophthalmic suspension Place 1 drop into both eyes daily.   . bromocriptine (PARLODEL) 2.5 MG tablet Take 5 mg by mouth daily.   . Cinnamon 500 MG capsule Take 1,000 mg by mouth daily.  Marland Kitchen glucosamine-chondroitin 500-400 MG tablet Take 2 tablets by mouth daily.  . Lactobacillus (PROBIOTIC ACIDOPHILUS PO) Take by mouth daily.   Marland Kitchen latanoprost (XALATAN) 0.005 % ophthalmic solution Place 1 drop into both eyes at bedtime.   . Magnesium 500 MG TABS Take 1 tablet by mouth daily. Patient says he takes magnesium orotate  . Omega-3 Fatty Acids (OMEGA ESSENTIALS BASIC) LIQD Take 15 mLs by mouth daily.  Marland Kitchen PARoxetine (PAXIL) 40 MG tablet Take 1 tablet (40 mg total) by mouth daily.  . potassium chloride SA (K-DUR,KLOR-CON) 20 MEQ tablet Take 2 tablets (40  mEq total) by mouth 2 (two) times daily.  . predniSONE (DELTASONE) 5 MG tablet Take 6 days taper (Patient not taking: Reported on 04/12/2019)  . quinapril (ACCUPRIL) 20 MG tablet TAKE 1 TABLET BY MOUTH EVERY DAY  . Spacer/Aero-Holding Chambers DEVI 1 Device by Does not apply route 4 (four) times daily.  Marland Kitchen spironolactone (ALDACTONE) 50 MG tablet Take 50 mg by mouth daily.  Marland Kitchen torsemide (DEMADEX) 20 MG tablet Take 3 tablets (60 mg total) by mouth 2 (two) times daily.   No facility-administered encounter medications on file as of 05/07/2019.     Goals Addressed            This Visit's Progress   . "I have a huge roach problem" (pt-stated)       Current Barriers:  . Patient has a roach infestation in his home   Clinical Social Work Clinical Goal(s):  Marland Kitchen Over the next 90 days, patient will work with the landlord  to address needs related to treating the roach infestation in his home.  Interventions: . Patient interviewed and appropriate assessments performed . Confirmed that he is feeling much better due to conversation and plan made with landlord to address the roach problem in his home . Confirmed that his best friend came down from [place] and helped get his home cleaned up, clutter removed and re-arranged to prepare for the extermination . Confirmed that patient is feeling like his home is "livable" again . Positive reinforcement provided for progress made to prepare  for extermination . Discussed need to follow with a mental health therapist for ongoing mental health support . Discussed follow up with [place], [employer] or the [employer]  . Discussed plans with patient to follow up with one of the agencies listed above for ongoing mental health follow up. This Education officer, museum provided patient with direct contact information for care management team to contact if needed in the future   Patient Self Care Activities:  . Patient verbalizes understanding of plan to work with  the landlord regarding roach problem, however is reluctant to contact the lanldord on his own . Calls provider office for new concerns or questions . Reluctance to contact lanldord on his own regarding roach infestation  Please see past updates related to this goal by clicking on the "Past Updates" button in the selected goal          Follow Up Plan: Client will contact this social worker if needed in the future    [place], [place] Social Worker  [facility] [phone]

## 2019-05-14 ENCOUNTER — Ambulatory Visit (INDEPENDENT_AMBULATORY_CARE_PROVIDER_SITE_OTHER): Payer: Medicare Other | Admitting: Family Medicine

## 2019-05-14 ENCOUNTER — Other Ambulatory Visit: Payer: Self-pay

## 2019-05-14 VITALS — BP 110/80 | HR 105 | Temp 96.9°F | Wt 220.0 lb

## 2019-05-14 DIAGNOSIS — I10 Essential (primary) hypertension: Secondary | ICD-10-CM

## 2019-05-14 DIAGNOSIS — I5032 Chronic diastolic (congestive) heart failure: Secondary | ICD-10-CM | POA: Diagnosis not present

## 2019-05-14 DIAGNOSIS — R200 Anesthesia of skin: Secondary | ICD-10-CM

## 2019-05-14 DIAGNOSIS — E782 Mixed hyperlipidemia: Secondary | ICD-10-CM | POA: Diagnosis not present

## 2019-05-14 DIAGNOSIS — R202 Paresthesia of skin: Secondary | ICD-10-CM

## 2019-05-14 DIAGNOSIS — R4781 Slurred speech: Secondary | ICD-10-CM | POA: Diagnosis not present

## 2019-05-14 DIAGNOSIS — Z125 Encounter for screening for malignant neoplasm of prostate: Secondary | ICD-10-CM | POA: Diagnosis not present

## 2019-05-14 DIAGNOSIS — Q761 Klippel-Feil syndrome: Secondary | ICD-10-CM | POA: Diagnosis not present

## 2019-05-14 DIAGNOSIS — F418 Other specified anxiety disorders: Secondary | ICD-10-CM

## 2019-05-14 NOTE — Progress Notes (Signed)
Patient: Scott [name], Male    DOB: 10-29-1970, 49 y.o.   MRN: [id number] Visit Date: 05/14/2019  Today's Provider: [name], PA    Subjective:  Scott [name] is a 49 y.o. male who presents today for follow up after Medicare Annual Wellness with the Nurse Health Advisor.  He is up to date on his Focus metrics and Health Maintenance.  His screening surveys have been completed without issue except for his PHQ9.  Score is noted Depression screen [facility] 2/9 04/12/2019 04/10/2018 02/20/2018 02/20/2018 09/29/2017  Decreased Interest 3 1 1 1 3   Down, Depressed, Hopeless 3 1 1 1 3   PHQ - 2 Score 6 2 2 2 6   Altered sleeping 3 1 0 - 3  Tired, decreased energy 3 1 0 - 3  Change in appetite 3 0 0 - 3  Feeling bad or failure about yourself  3 1 0 - 3  Trouble concentrating 0 0 0 - 1  Moving slowly or fidgety/restless 0 1 0 - 2  Suicidal thoughts 0 0 0 - 2  PHQ-9 Score 18 6 2  - 23  Difficult doing work/chores Very difficult Somewhat difficult Somewhat difficult - Extremely dIfficult    Review of Systems  Constitutional: Positive for activity change.  HENT: Positive for voice change.   Eyes: Negative.   Respiratory: Positive for shortness of breath.   Cardiovascular: Positive for leg swelling.  Gastrointestinal: Negative.   Endocrine: Negative.   Genitourinary: Negative.   Musculoskeletal: Positive for arthralgias, myalgias, neck pain and neck stiffness.  Skin: Negative.   Allergic/Immunologic: Negative.   Neurological: Negative.   Hematological: Negative.   Psychiatric/Behavioral: Negative.     Social History   Socioeconomic History  . Marital status: Single    Spouse name: Not on file  . Number of children: 0  . Years of education: Not on file  . Highest education level: Some college, no degree  Occupational History  . Occupation: disability  Tobacco Use  . Smoking status: Former Smoker    Packs/day: 1.00    Years: 10.00    Pack years: 10.00    Types: Cigarettes    Quit date:  05/2006    Years since quitting: 13.0  . Smokeless tobacco: Never Used  . Tobacco comment: quit 05/2006  Substance and Sexual Activity  . Alcohol use: No    Alcohol/week: 0.0 standard drinks  . Drug use: No  . Sexual activity: Not on file  Other Topics Concern  . Not on file  Social History Narrative  . Not on file   Social Determinants of Health   Financial Resource Strain: Medium Risk  . Difficulty of Paying Living Expenses: Somewhat hard  Food Insecurity: No Food Insecurity  . Worried About Charity fundraiser in the Last Year: Never true  . Ran Out of Food in the Last Year: Never true  Transportation Needs: No Transportation Needs  . Lack of Transportation (Medical): No  . Lack of Transportation (Non-Medical): No  Physical Activity: Inactive  . Days of Exercise per Week: 0 days  . Minutes of Exercise per Session: 0 min  Stress: Stress Concern Present  . Feeling of Stress : Very much  Social Connections: Somewhat Isolated  . Frequency of Communication with Friends and Family: Three times a week  . Frequency of Social Gatherings with Friends and Family: Never  . Attends Religious Services: Never  . Active Member of Clubs or Organizations: No  . Attends Archivist Meetings:  Never  . Marital Status: Living with partner  Intimate Partner Violence: Not At Risk  . Fear of Current or Ex-Partner: No  . Emotionally Abused: No  . Physically Abused: No  . Sexually Abused: No    Patient Active Problem List   Diagnosis Date Noted  . Chronic diastolic heart failure ([place]) 02/16/2018  . Accelerated hypertension 02/05/2018  . Acute diastolic heart failure ([place])   . Kyphoscoliosis and scoliosis 01/30/2018  . Erectile dysfunction 09/09/2016  . Disorder of bursae of shoulder region 09/07/2016  . Impingement syndrome of shoulder region 09/07/2016  . Anxiety 09/11/2014  . Airway hyperreactivity 09/11/2014  . CN (constipation) 09/11/2014  . Alopecia, male pattern  09/11/2014  . Decreased libido 09/11/2014  . Dysfunction of eustachian tube 09/11/2014  . Groin strain 09/11/2014  . Cephalalgia 09/11/2014  . Umbilical hernia 99991111  . Essential hypertension 09/11/2014  . Migraine 09/11/2014  . Neoplasm of uncertain behavior 99991111  . Hernia of anterior abdominal wall 09/11/2014  . Contact dermatitis due to Genus Toxicodendron 09/11/2014  . Open-angle glaucoma 05/20/2009  . Congenital hydrocephalus ([place]) 05/20/2009  . 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 Enteritis presumed infectious 06/11/2008  . HLD (hyperlipidemia) 03/15/2008  . Clinical depression 06/13/2007    Past Surgical History:  Procedure Laterality Date  . COLOSTOMY    . DENVER SHUNT PLACEMENT      His family history includes Cancer in his paternal grandfather; Colon cancer in his father; Heart disease in his father; Lung cancer in his maternal grandmother; Multiple sclerosis in his mother; Ovarian cancer in his paternal grandmother; Thyroid cancer in his maternal grandfather.     Allergies  Allergen Reactions  . Codeine Shortness Of Breath   Outpatient Encounter Medications as of 05/14/2019  Medication Sig  . albuterol (PROVENTIL HFA;VENTOLIN HFA) 108 (90 Base) MCG/ACT inhaler Inhale 2 puffs into the lungs every 4 (four) hours as needed for wheezing or shortness of breath.  . ANDROGEL PUMP 20.25 MG/ACT (1.62%) GEL Apply 1 application topically daily.   . Ascorbic Acid (VITAMIN C) 1000 MG tablet Take 1,000 mg by mouth 2 (two) times a day.  . b complex vitamins capsule Take 1 capsule by mouth daily.  . brinzolamide (AZOPT) 1 % ophthalmic suspension Place 1 drop into both eyes daily.   . bromocriptine (PARLODEL) 2.5 MG tablet Take 5 mg by mouth daily.   . Cinnamon 500 MG capsule Take 1,000 mg by mouth daily.  Marland Kitchen glucosamine-chondroitin 500-400 MG tablet Take 2 tablets by mouth daily.  . Lactobacillus (PROBIOTIC ACIDOPHILUS PO) Take by mouth daily.   Marland Kitchen latanoprost (XALATAN)  0.005 % ophthalmic solution Place 1 drop into both eyes at bedtime.   . Magnesium 500 MG TABS Take 1 tablet by mouth daily. Patient says he takes magnesium orotate  . Omega-3 Fatty Acids (OMEGA ESSENTIALS BASIC) LIQD Take 15 mLs by mouth daily.  Marland Kitchen PARoxetine (PAXIL) 40 MG tablet Take 1 tablet (40 mg total) by mouth daily.  . quinapril (ACCUPRIL) 20 MG tablet TAKE 1 TABLET BY MOUTH EVERY DAY  . Spacer/Aero-Holding Chambers DEVI 1 Device by Does not apply route 4 (four) times daily.  Marland Kitchen spironolactone (ALDACTONE) 50 MG tablet Take 50 mg by mouth daily.  Marland Kitchen torsemide (DEMADEX) 20 MG tablet Take 3 tablets (60 mg total) by mouth 2 (two) times daily.  . potassium chloride SA (K-DUR,KLOR-CON) 20 MEQ tablet Take 2 tablets (40 mEq total) by mouth 2 (two) times daily.  . [DISCONTINUED] predniSONE (DELTASONE) 5 MG tablet  Take 6 days taper (Patient not taking: Reported on 04/12/2019)   No facility-administered encounter medications on file as of 05/14/2019.    Patient Care Team: Virdell Hoiland, [name], PA as PCP - General (Physician Assistant) Alisa Graff, FNP (Family Medicine) Ronnald Collum, Lourdes Sledge, MD as Attending Physician (Endocrinology) Vern Claude, [place] as [place] Management      Objective:   Vitals:  Vitals:   05/14/19 1347  BP: 102/64  Pulse: (!) 114  Temp: (!) 96.9 F (36.1 C)  TempSrc: Skin  SpO2: 96%  Weight: 220 lb (99.8 kg)   Wt Readings from Last 3 Encounters:  05/14/19 220 lb (99.8 kg)  09/20/18 205 lb (93 kg)  06/21/18 203 lb 8 oz (92.3 kg)   Vitals:   05/14/19 1347 05/14/19 1349  BP: 102/64 110/80  Pulse: (!) 114 (!) 105  Temp: (!) 96.9 F (36.1 C)   TempSrc: Skin   SpO2: 96%   Weight: 220 lb (99.8 kg)     Physical Exam Constitutional:      General: He is not in acute distress.    Appearance: He is well-developed.  HENT:     Head: Normocephalic and atraumatic.     Right Ear: Hearing and tympanic membrane normal.     Left Ear: Hearing and  tympanic membrane normal.     Nose: Nose normal.  Eyes:     General: Lids are normal. No scleral icterus.       Right eye: No discharge.        Left eye: No discharge.     Conjunctiva/sclera: Conjunctivae normal.  Cardiovascular:     Rate and Rhythm: Normal rate and regular rhythm.     Pulses: Normal pulses.     Heart sounds: Normal heart sounds.  Pulmonary:     Effort: Pulmonary effort is normal. No respiratory distress.  Musculoskeletal:     Cervical back: Rigidity present.     Right lower leg: No edema.     Left lower leg: No edema.     Comments: Stiffness of neck with history of shunt along right side of neck for congenital hydrocephalus. Deformities of both hands with the left having numbness and rigid wrist. Numbness in the left 5th finger. No new weakness in hands.  Skin:    Findings: No lesion or rash.  Neurological:     Mental Status: He is alert and oriented to person, place, and time.  Psychiatric:        Mood and Affect: Mood is anxious and depressed.        Speech: Speech is slurred.        Behavior: Behavior normal.        Thought Content: Thought content normal.    Depression Screen PHQ 2/9 Scores 04/12/2019 04/10/2018 02/20/2018 02/20/2018  PHQ - 2 Score 6 2 2 2   PHQ- 9 Score 18 6 2  -  Exception Documentation - - Other- indicate reason in comment box -  Not completed - - (No Data) -    Assessment & Plan:     Routine Health Maintenance and Physical Exam  Exercise Activities and Dietary recommendations Goals    . "I have a huge roach problem" (pt-stated)     Current Barriers:  . Patient has a roach infestation in his home   Clinical Social Work Clinical Goal(s):  Marland Kitchen Over the next 90 days, patient will work with the landlord  to address needs related to treating the roach infestation  in his home.  Interventions: . Patient interviewed and appropriate assessments performed . Confirmed that he is feeling much better due to conversation and plan made with  landlord to address the roach problem in his home . Confirmed that his best friend came down from [place] and helped get his home cleaned up, clutter removed and re-arranged to prepare for the extermination . Confirmed that patient is feeling like his home is "livable" again . Positive reinforcement provided for progress made to prepare for extermination . Discussed need to follow with a mental health therapist for ongoing mental health support . Discussed follow up with [place], [employer] or the [employer]  . Discussed plans with patient to follow up with one of the agencies listed above for ongoing mental health follow up. This Education officer, museum provided patient with direct contact information for care management team to contact if needed in the future   Patient Self Care Activities:  . Patient verbalizes understanding of plan to work with the landlord regarding roach problem, however is reluctant to contact the lanldord on his own . Calls provider office for new concerns or questions . Reluctance to contact lanldord on his own regarding roach infestation  Please see past updates related to this goal by clicking on the "Past Updates" button in the selected goal      . DIET - INCREASE LEAN PROTEINS     Recommend to continue current diet plan of cutting out salt and fatty meats and focus on eating all lean meats.        Immunization History  Administered Date(s) Administered  . Tdap 02/20/2018    Health Maintenance  Topic Date Due  . INFLUENZA VACCINE  07/04/2019 (Originally 11/04/2018)  . TETANUS/TDAP  02/21/2028  . HIV Screening  Completed     Discussed health benefits of physical activity, and encouraged him to engage in regular exercise appropriate for his age and condition.   1. Essential hypertension Continues to tolerate Accupril 20 mg qd with Spironolactone and Demadex for CHF. BP well controlled but tachycardic upon arrival in the exam room. No chest pains  or dyspnea. Heart rate came down in 2 minutes to 105.  2. Mixed hyperlipidemia Had labs done by endocrinologist and will get copy for review. Takes Omega-3 Fish Oil daily. Last lipid panel 1 year ago showed total cholesterol 155, triglycerides 74, HDL 55 and LDL 85. Follow up pending review of reports. Probably need to try to achieve LDL below 70.  3. Klippel-Feil syndrome Short stature and stiffness of neck with multiple congenital bony malformations. Having some slurring of speech that had been troublesome to him and requests speech therapy evaluation and treatment. Has had deformities of hands with difficulty with fine motor manipulation and past surgeries. Requests hand surgeon evaluation for any additional assistance.  - Ambulatory referral to Hand Surgery - Ambulatory referral to Speech Therapy  4. Depression with anxiety PHQ-9 score is 18 today. No suicidal ideation but feeling very sad and poor motivation. Still taking Paroxetine 40 mg qd and feels anxiety is fairly well controlled. Always a little more anxious in the office and heart rate is elevated at presentation. Continue present dosage and recheck in 3-4 months.  5. Chronic diastolic heart failure (HCC) History of NYHA Class II diastolic CHF. Presently on Demadex 20 mg 4 tablets daily and Spironolactone 50 mg qd. EF 55-60% on 02-05-18. Continues to monitor weight and followed at the CHF clinic. Requests referral for second opinion with a different  cardiologist.  - Ambulatory referral to Cardiology  6. Numbness and tingling in left hand Long term numbness in the left 5th finger and difficulty with fine motor manipulation due to congenital deformities and wrist stiffness. - Ambulatory referral to Hand Surgery  7. Screening PSA (prostate specific antigen) Denies nocturia, hesitancy, dribbling or retention. Will postpone PSA test until he brings in recent lab report from endocrinologist.  8. Slurred speech No new changes. Concerned  that people can't understand him at times. Requests Speech Therapy evaluation and treatment. - Ambulatory referral to Speech Therapy

## 2019-05-28 NOTE — Progress Notes (Signed)
Cardiology Office Note  Date:  05/29/2019   ID:  CAIN PANTHER, DOB 1970-10-18, MRN [id number]  PCP:  Margo Common, PA   Chief Complaint  Patient presents with  . other    Ref by [name], PA for CHF. Pt. c/o shortness of breath, LE edema and abdominal swelling.     HPI:  Scott [name] is a 49 y.o. male with history of  congenital abnormality (Klippel-File syndrome)with restrictive lung disease and airway hyperactivity,  diastolic CHF,  Former smoker, quit 2008 hypertension,  hyperlipidemia,  asthma,  depression, anxiety Who presents for routine follow-up of his chronic diastolic CHF, lung disease  Last seen by Dr. Fletcher Anon March 2020 Followed by CHF clinic No labs since 06/2018, normal renal function at that time  Echo report from 02/05/18 reviewed and showed an EF of 55-60% Hospitalization November 2019, acute on chronic diastolic CHF with hypoxia requiring IV Lasix  Labs through [facility] No recent labs in the system  On today's visit feels that he has extra fluid, carries this in his abdomen Weight is up over the past year at least 10 pounds if not more Reports he was taking torsemide 60 twice daily, did not feel that this was working started taking 80 twice daily Afraid he is going to run out of medications now just takes torsemide 80 in the morning.  This seems to work for him  Hypoxia walking in the office 89% with walking, Up to 99% with oxygen at rest  Not followed by pulmonary, missed appts with [facility], Being set up with [facility] per the patient though he is unclear  No leg swelling Weight at home 214 Feels his weight should be low 190s  Breathing has been poor since 02/2018,  Felt well after hospital d/c Weight at that time low 190s and felt well  Testing as below Echo on 02/05/2018 showed an LV EF: 55% - 60%.  Stress test on 02/09/2018 showed no evidence of ischemia..  Sleep study on 03/10/2018, was negative for OSA. Pulmonary function  testing showed severe restrictive lung disease.  EKG personally reviewed by myself on todays visit Shows sinus tachycardia rate 101 bpm no significant ST-T wave changes  PMH:   has a past medical history of Acid reflux, Anxiety, Arthritis, Asthma, CHF (congestive heart failure) ([place]), Depression, Glaucoma, Hyperlipidemia, Hypertension, and Klippel-Feil syndrome.  PSH:    Past Surgical History:  Procedure Laterality Date  . COLOSTOMY    . DENVER SHUNT PLACEMENT      Current Outpatient Medications  Medication Sig Dispense Refill  . albuterol (PROVENTIL HFA;VENTOLIN HFA) 108 (90 Base) MCG/ACT inhaler Inhale 2 puffs into the lungs every 4 (four) hours as needed for wheezing or shortness of breath. 1 Inhaler 2  . ANDROGEL PUMP 20.25 MG/ACT (1.62%) GEL Apply 1 application topically daily.     . Ascorbic Acid (VITAMIN C) 1000 MG tablet Take 1,000 mg by mouth 2 (two) times a day.    . b complex vitamins capsule Take 1 capsule by mouth daily.    . brinzolamide (AZOPT) 1 % ophthalmic suspension Place 1 drop into both eyes daily.     . bromocriptine (PARLODEL) 2.5 MG tablet Take 5 mg by mouth daily.   0  . Cinnamon 500 MG capsule Take 1,000 mg by mouth daily.    Marland Kitchen glucosamine-chondroitin 500-400 MG tablet Take 2 tablets by mouth daily.    . Lactobacillus (PROBIOTIC ACIDOPHILUS PO) Take by mouth daily.     Marland Kitchen  latanoprost (XALATAN) 0.005 % ophthalmic solution Place 1 drop into both eyes at bedtime.     . Magnesium 500 MG TABS Take 1 tablet by mouth daily. Patient says he takes magnesium orotate    . Omega-3 Fatty Acids (OMEGA ESSENTIALS BASIC) LIQD Take 15 mLs by mouth daily.    Marland Kitchen PARoxetine (PAXIL) 40 MG tablet Take 1 tablet (40 mg total) by mouth daily. 30 tablet 11  . potassium chloride SA (K-DUR,KLOR-CON) 20 MEQ tablet Take 2 tablets (40 mEq total) by mouth 2 (two) times daily. 360 tablet 3  . quinapril (ACCUPRIL) 20 MG tablet TAKE 1 TABLET BY MOUTH EVERY DAY 90 tablet 3  . Spacer/Aero-Holding  Chambers DEVI 1 Device by Does not apply route 4 (four) times daily. 1 each 0  . spironolactone (ALDACTONE) 50 MG tablet Take 50 mg by mouth daily.    Marland Kitchen torsemide (DEMADEX) 20 MG tablet Take 3 tablets (60 mg total) by mouth 2 (two) times daily. 540 tablet 3   No current facility-administered medications for this visit.     Allergies:   Codeine   Social History:  The patient  reports that he quit smoking about 13 years ago. His smoking use included cigarettes. He has a 10.00 pack-year smoking history. He has never used smokeless tobacco. He reports that he does not drink alcohol or use drugs.   Family History:   family history includes Cancer in his paternal grandfather; Colon cancer in his father; Heart disease in his father; Lung cancer in his maternal grandmother; Multiple sclerosis in his mother; Ovarian cancer in his paternal grandmother; Thyroid cancer in his maternal grandfather.    Review of Systems: Review of Systems  Constitutional: Negative.        Abdominal swelling  HENT: Negative.   Respiratory: Positive for shortness of breath.   Cardiovascular: Negative.   Gastrointestinal: Negative.   Musculoskeletal: Negative.   Neurological: Negative.   Psychiatric/Behavioral: Negative.   All other systems reviewed and are negative.    PHYSICAL EXAM: VS:  Ht 5\' 2"  (1.575 m)   Wt 216 lb 8 oz (98.2 kg)   SpO2 99% Comment: on 2 liters of oxygen  BMI 39.60 kg/m  , BMI Body mass index is 39.6 kg/m. GEN: Well nourished, well developed, in no acute distress, obese HEENT: normal Neck: no JVD, carotid bruits, or masses Cardiac: RRR; no murmurs, rubs, or gallops,no edema  Respiratory:  clear to auscultation bilaterally, normal work of breathing GI: soft, nontender, nondistended, + BS MS: no deformity or atrophy Skin: warm and dry, no rash Neuro:  Strength and sensation are intact Psych: euthymic mood, full affect   Recent Labs: 06/21/2018: BUN 17; Creatinine, Ser 1.17;  Potassium 3.5; Sodium 142    Lipid Panel Lab Results  Component Value Date   CHOL 155 02/07/2018   HDL 55 02/07/2018   LDLCALC 85 02/07/2018   TRIG 74 02/07/2018      Wt Readings from Last 3 Encounters:  05/29/19 216 lb 8 oz (98.2 kg)  05/14/19 220 lb (99.8 kg)  09/20/18 205 lb (93 kg)       ASSESSMENT AND PLAN:  Problem List Items Addressed This Visit      Cardiology Problems   Chronic diastolic heart failure (HCC) - Primary (Chronic)   HLD (hyperlipidemia)   Essential hypertension     Other   Klippel-Feil syndrome    Other Visit Diagnoses    Restrictive lung disease  Acute on chronic respiratory distress Secondary to restrictive lung disease, Obesity, chronic diastolic CHF We have requested lab work from Dr. Francoise Schaumann (outside our system) He feels significant fullness in his abdomen, weight is higher -Suggested he add metolazone 2.5 mg 2 times a week for weight over 209 pounds -Repeat BMP in 1 month's time -Referral to pulmonary -Hypoxia with walking off oxygen down to 89%, only a short distance -We will defer to pulmonary but may need chronic oxygen  Chronic diastolic CHF Plan as above, continue torsemide preferably 40 twice daily or 60 twice daily with metolazone 2.5 two times a week -Stay on potassium with spironolactone Repeat BMP 1 month  Obesity We have encouraged careful diet management in an effort to lose weight.   Disposition:   F/U  3 months   Total encounter time more than 25 minutes  Greater than 50% was spent in counseling and coordination of care with the patient    Signed, Esmond Plants, M.D., Ph.D. [place], [place]

## 2019-05-29 ENCOUNTER — Ambulatory Visit (INDEPENDENT_AMBULATORY_CARE_PROVIDER_SITE_OTHER): Payer: Medicare Other | Admitting: Cardiovascular Disease

## 2019-05-29 ENCOUNTER — Telehealth: Payer: Self-pay | Admitting: *Deleted

## 2019-05-29 ENCOUNTER — Other Ambulatory Visit: Payer: Self-pay

## 2019-05-29 ENCOUNTER — Encounter: Payer: Self-pay | Admitting: Cardiovascular Disease

## 2019-05-29 VITALS — BP 116/80 | HR 101 | Ht 62.0 in | Wt 216.5 lb

## 2019-05-29 DIAGNOSIS — E782 Mixed hyperlipidemia: Secondary | ICD-10-CM | POA: Diagnosis not present

## 2019-05-29 DIAGNOSIS — E785 Hyperlipidemia, unspecified: Secondary | ICD-10-CM

## 2019-05-29 DIAGNOSIS — I10 Essential (primary) hypertension: Secondary | ICD-10-CM

## 2019-05-29 DIAGNOSIS — J984 Other disorders of lung: Secondary | ICD-10-CM | POA: Diagnosis not present

## 2019-05-29 DIAGNOSIS — Q761 Klippel-Feil syndrome: Secondary | ICD-10-CM

## 2019-05-29 DIAGNOSIS — I5032 Chronic diastolic (congestive) heart failure: Secondary | ICD-10-CM | POA: Diagnosis not present

## 2019-05-29 MED ORDER — METOLAZONE 2.5 MG PO TABS: 2.5000 mg | ORAL_TABLET | ORAL | 3 refills | Status: DC

## 2019-05-29 NOTE — Patient Instructions (Addendum)
Referral to pulmonary, Scott [name]  We will obtain labs from [facility]  Medication Instructions:  Please take metolazone 2.5 mg twice a week for weight > 209 BMP in one month  If you need a refill on your cardiac medications before your next appointment, please call your pharmacy.    Lab work: No new labs needed   If you have labs (blood work) drawn today and your tests are completely normal, you will receive your results only by: Marland Kitchen MyChart Message (if you have MyChart) OR . A paper copy in the mail If you have any lab test that is abnormal or we need to change your treatment, we will call you to review the results.   Testing/Procedures: No new testing needed   Follow-Up: At [facility], you and your health needs are our priority.  As part of our continuing mission to provide you with exceptional heart care, we have created designated Provider Care Teams.  These Care Teams include your primary Cardiologist (physician) and Advanced Practice Providers (APPs -  Physician Assistants and Nurse Practitioners) who all work together to provide you with the care you need, when you need it.  . You will need a follow up appointment in 3 months   . Providers on your designated Care Team:   . Scott Hodgkins, NP . Scott Faith, PA-C . Scott Mood, PA-C  Any Other Special Instructions Will Be Listed Below (If Applicable).  COVID-19 Vaccine Information can be found at: [email or web address] For questions related to vaccine distribution or appointments, please email [email or web address] or call [phone].

## 2019-05-29 NOTE — Telephone Encounter (Signed)
Fax received from [facility]. [place], [place] [address] [phone]  Fax: [phone]  Metolazone 2.5 mg 1 tablet by mouth twice a week.  Drug not covered by pt plan.  Preferred alternative HCTZ, Indapamide and Bumetanide.  Please call/fax change of medication w/ strength, directions, quantity and refills.  Please advise.

## 2019-05-29 NOTE — Telephone Encounter (Signed)
error 

## 2019-05-29 NOTE — Telephone Encounter (Signed)
Would try [email or web address]

## 2019-05-30 MED ORDER — METOLAZONE 2.5 MG PO TABS: 2.5000 mg | ORAL_TABLET | ORAL | 3 refills | Status: DC

## 2019-05-30 NOTE — Telephone Encounter (Signed)
Spoke with patient and reviewed that insurance does not cover this medication and that if he should get it at [facility] with [facility] it would be roughly $15.00 with no insurance. He was agreeable with this and prescription sent over to them. Advised to please call us if he should have any further questions at this time.

## 2019-06-05 ENCOUNTER — Telehealth: Payer: Self-pay

## 2019-06-05 NOTE — Telephone Encounter (Signed)
PA started through [employer] Key: [id number]  PA Case ID: [id number]  Rx #: [id number]

## 2019-06-08 NOTE — Telephone Encounter (Signed)
Prior auth denied but as mentioned in previous telephone encounter patient was able to get medication using [email or web address] at [facility] using coupon at agreeable price. No further appeal needed for this.

## 2019-06-11 ENCOUNTER — Other Ambulatory Visit: Payer: Self-pay

## 2019-06-11 MED ORDER — METOLAZONE 2.5 MG PO TABS: 2.5000 mg | ORAL_TABLET | ORAL | 3 refills | Status: DC

## 2019-06-21 ENCOUNTER — Encounter: Payer: Medicare Other | Admitting: Speech Pathology

## 2019-06-21 ENCOUNTER — Other Ambulatory Visit: Payer: Self-pay | Admitting: Family Medicine

## 2019-06-21 DIAGNOSIS — Q761 Klippel-Feil syndrome: Secondary | ICD-10-CM | POA: Diagnosis not present

## 2019-06-21 DIAGNOSIS — M79642 Pain in left hand: Secondary | ICD-10-CM | POA: Diagnosis not present

## 2019-06-21 DIAGNOSIS — F418 Other specified anxiety disorders: Secondary | ICD-10-CM

## 2019-06-21 MED ORDER — PAROXETINE HCL 40 MG PO TABS: 40.0000 mg | ORAL_TABLET | Freq: Every day | ORAL | 11 refills | Status: DC

## 2019-06-21 NOTE — Telephone Encounter (Signed)
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

## 2019-06-23 ENCOUNTER — Ambulatory Visit: Payer: Medicare Other | Attending: Internal Medicine

## 2019-06-23 ENCOUNTER — Other Ambulatory Visit: Payer: Self-pay

## 2019-06-23 DIAGNOSIS — Z23 Encounter for immunization: Secondary | ICD-10-CM

## 2019-06-23 NOTE — Progress Notes (Signed)
   Covid-19 Vaccination Clinic  Name:  SEBASTYN TIEDT    MRN: [id number] DOB: [birth year]  06/23/2019  Mr. Walsh was observed post Covid-19 immunization for 15 minutes without incident. He was provided with Vaccine Information Sheet and instruction to access the V-Safe system.   Mr. Nish was instructed to call 911 with any severe reactions post vaccine: Marland Kitchen Difficulty breathing  . Swelling of face and throat  . A fast heartbeat  . A bad rash all over body  . Dizziness and weakness   Immunizations Administered    Name Date Dose VIS Date Route   Pfizer COVID-19 Vaccine 06/23/2019  1:42 PM 0.3 mL 03/16/2019 Intramuscular   Manufacturer: [employer], [employer]   Lot: [id number]   [place]: [id number]

## 2019-06-25 ENCOUNTER — Ambulatory Visit: Payer: Medicare Other | Admitting: Speech Pathology

## 2019-07-02 ENCOUNTER — Encounter: Payer: Medicare Other | Admitting: Speech Pathology

## 2019-07-03 ENCOUNTER — Other Ambulatory Visit: Payer: Self-pay | Admitting: Cardiovascular Disease

## 2019-07-03 MED ORDER — METOLAZONE 2.5 MG PO TABS: 2.5000 mg | ORAL_TABLET | ORAL | 0 refills | Status: DC

## 2019-07-03 NOTE — Telephone Encounter (Signed)
Requested Prescriptions   Signed Prescriptions Disp Refills  . metolazone (ZAROXOLYN) 2.5 MG tablet 15 tablet 0    Sig: Take 1 tablet (2.5 mg total) by mouth 2 (two) times a week.    Authorizing Provider: Minna Merritts    Ordering User: Britt Bottom

## 2019-07-03 NOTE — Telephone Encounter (Signed)
*  STAT* If patient is at the pharmacy, call can be transferred to refill team.   1. Which medications need to be refilled? (please list name of each medication and dose if known)   Metolazone 2.5 mg po BID  2. Which pharmacy/location (including street and city if local pharmacy) is medication to be sent to?[facility]   3. Do they need a 30 day or 90 day supply?  [place]

## 2019-07-05 ENCOUNTER — Encounter: Payer: Medicare Other | Admitting: Speech Pathology

## 2019-07-09 ENCOUNTER — Encounter: Payer: Medicare Other | Admitting: Speech Pathology

## 2019-07-12 ENCOUNTER — Encounter: Payer: Medicare Other | Admitting: Speech Pathology

## 2019-07-16 ENCOUNTER — Encounter: Payer: Medicare Other | Admitting: Speech Pathology

## 2019-07-17 ENCOUNTER — Ambulatory Visit: Payer: Medicare Other

## 2019-07-17 DIAGNOSIS — E221 Hyperprolactinemia: Secondary | ICD-10-CM | POA: Diagnosis not present

## 2019-07-17 DIAGNOSIS — I5031 Acute diastolic (congestive) heart failure: Secondary | ICD-10-CM | POA: Diagnosis not present

## 2019-07-17 DIAGNOSIS — Q02 Microcephaly: Secondary | ICD-10-CM | POA: Diagnosis not present

## 2019-07-17 DIAGNOSIS — R7301 Impaired fasting glucose: Secondary | ICD-10-CM | POA: Diagnosis not present

## 2019-07-17 DIAGNOSIS — E291 Testicular hypofunction: Secondary | ICD-10-CM | POA: Diagnosis not present

## 2019-07-17 DIAGNOSIS — Q761 Klippel-Feil syndrome: Secondary | ICD-10-CM | POA: Diagnosis not present

## 2019-07-17 DIAGNOSIS — Q540 Hypospadias, balanic: Secondary | ICD-10-CM | POA: Diagnosis not present

## 2019-07-17 DIAGNOSIS — D352 Benign neoplasm of pituitary gland: Secondary | ICD-10-CM | POA: Diagnosis not present

## 2019-07-17 DIAGNOSIS — E785 Hyperlipidemia, unspecified: Secondary | ICD-10-CM | POA: Diagnosis not present

## 2019-07-18 ENCOUNTER — Ambulatory Visit: Payer: Medicare Other | Attending: Internal Medicine

## 2019-07-18 DIAGNOSIS — Z23 Encounter for immunization: Secondary | ICD-10-CM

## 2019-07-18 NOTE — Progress Notes (Signed)
   Covid-19 Vaccination Clinic  Name:  Scott [name]    MRN: [id number] DOB: 1971-01-10  07/18/2019  [name] was observed post Covid-19 immunization for 15 minutes without incident. He was provided with Vaccine Information Sheet and instruction to access the V-Safe system.   [name] was instructed to call 911 with any severe reactions post vaccine: Marland Kitchen Difficulty breathing  . Swelling of face and throat  . A fast heartbeat  . A bad rash all over body  . Dizziness and weakness   Immunizations Administered    Name Date Dose VIS Date Route   Pfizer COVID-19 Vaccine 07/18/2019  2:40 PM 0.3 mL 03/16/2019 Intramuscular   Manufacturer: [employer], [employer]   Lot: [id number]   [place]: [id number]

## 2019-07-19 ENCOUNTER — Encounter: Payer: Medicare Other | Admitting: Speech Pathology

## 2019-07-23 ENCOUNTER — Encounter: Payer: Medicare Other | Admitting: Speech Pathology

## 2019-07-24 DIAGNOSIS — E785 Hyperlipidemia, unspecified: Secondary | ICD-10-CM | POA: Diagnosis not present

## 2019-07-24 DIAGNOSIS — I5031 Acute diastolic (congestive) heart failure: Secondary | ICD-10-CM | POA: Diagnosis not present

## 2019-07-24 DIAGNOSIS — Q540 Hypospadias, balanic: Secondary | ICD-10-CM | POA: Diagnosis not present

## 2019-07-24 DIAGNOSIS — Q02 Microcephaly: Secondary | ICD-10-CM | POA: Diagnosis not present

## 2019-07-24 DIAGNOSIS — E291 Testicular hypofunction: Secondary | ICD-10-CM | POA: Diagnosis not present

## 2019-07-24 DIAGNOSIS — E221 Hyperprolactinemia: Secondary | ICD-10-CM | POA: Diagnosis not present

## 2019-07-24 DIAGNOSIS — F5101 Primary insomnia: Secondary | ICD-10-CM | POA: Diagnosis not present

## 2019-07-24 DIAGNOSIS — E6609 Other obesity due to excess calories: Secondary | ICD-10-CM | POA: Diagnosis not present

## 2019-07-24 DIAGNOSIS — I10 Essential (primary) hypertension: Secondary | ICD-10-CM | POA: Diagnosis not present

## 2019-07-24 DIAGNOSIS — Q761 Klippel-Feil syndrome: Secondary | ICD-10-CM | POA: Diagnosis not present

## 2019-07-24 DIAGNOSIS — F4322 Adjustment disorder with anxiety: Secondary | ICD-10-CM | POA: Diagnosis not present

## 2019-07-24 DIAGNOSIS — D352 Benign neoplasm of pituitary gland: Secondary | ICD-10-CM | POA: Diagnosis not present

## 2019-07-26 ENCOUNTER — Encounter: Payer: Medicare Other | Admitting: Speech Pathology

## 2019-08-16 ENCOUNTER — Telehealth: Payer: Self-pay | Admitting: Cardiovascular Disease

## 2019-08-16 NOTE — Telephone Encounter (Signed)
Spoke with patient and he wanted to know if he should continue the metolazone until next appointment. Advised to use sparingly but that he could take 2 times per week as advised by provider. He wanted to know if he needed refill and calculated that he would have enough to last until his follow up visit with provider. I did instruct him to call if he should run out but that he should have enough. He verbalized understanding with no further questions at this time.

## 2019-08-16 NOTE — Telephone Encounter (Signed)
Patient calling to r/s appointment Patient would like a medication clarification before appt on 07/12 Please call to discuss

## 2019-09-04 ENCOUNTER — Ambulatory Visit: Payer: Medicare Other | Admitting: Cardiovascular Disease

## 2019-09-13 ENCOUNTER — Encounter: Payer: Self-pay | Admitting: Family Medicine

## 2019-09-13 ENCOUNTER — Telehealth (INDEPENDENT_AMBULATORY_CARE_PROVIDER_SITE_OTHER): Payer: Medicare Other | Admitting: Family Medicine

## 2019-09-13 ENCOUNTER — Other Ambulatory Visit: Payer: Medicare Other

## 2019-09-13 DIAGNOSIS — J029 Acute pharyngitis, unspecified: Secondary | ICD-10-CM | POA: Diagnosis not present

## 2019-09-13 DIAGNOSIS — J069 Acute upper respiratory infection, unspecified: Secondary | ICD-10-CM

## 2019-09-13 NOTE — Progress Notes (Signed)
MyChart Video Visit    Virtual Visit via Video Note   This visit type was conducted due to national recommendations for restrictions regarding the COVID-19 Pandemic (e.g. social distancing) in an effort to limit this patient's exposure and mitigate transmission in our community. This patient is at least at moderate risk for complications without adequate follow up. This format is felt to be most appropriate for this patient at this time. Physical exam was limited by quality of the video and audio technology used for the visit.   Patient location: Home Provider location: Office   Patient: Scott [name]   DOB: [birth year]   49 y.o. Male  MRN: [id number] Visit Date: 09/13/2019  Today's healthcare provider: [name], PA   Chief Complaint  Patient presents with  . Sore Throat   Mertie Moores as a scribe for [employer], PA.,have documented all relevant documentation on the behalf of [employer], PA,as directed by  [employer], PA while in the presence of [employer], [place].  Subjective    Sore Throat  This is a new problem. Episode onset: few days ago. The problem has been gradually improving. Neither side of throat is experiencing more pain than the other. Associated symptoms include congestion. Associated symptoms comments: Possible low grade fever. He has tried acetaminophen for the symptoms. The treatment provided mild relief.     Past Medical History:  Diagnosis Date  . Acid reflux   . Anxiety   . Arthritis   . Asthma   . CHF (congestive heart failure) ([place])   . Depression   . Glaucoma   . Hyperlipidemia   . Hypertension   . Klippel-Feil syndrome    Past Surgical History:  Procedure Laterality Date  . COLOSTOMY    . DENVER SHUNT PLACEMENT     Social History   Tobacco Use  . Smoking status: Former Smoker    Packs/day: 1.00    Years: 10.00    Pack years: 10.00    Types: Cigarettes    Quit date: 05/2006    Years since quitting:  13.3  . Smokeless tobacco: Never Used  . Tobacco comment: quit 05/2006  Vaping Use  . Vaping Use: Former  . Devices: tried once  Substance Use Topics  . Alcohol use: No    Alcohol/week: 0.0 standard drinks  . Drug use: No   Allergies  Allergen Reactions  . Codeine Shortness Of Breath      Medications: Outpatient Medications Prior to Visit  Medication Sig  . albuterol (PROVENTIL HFA;VENTOLIN HFA) 108 (90 Base) MCG/ACT inhaler Inhale 2 puffs into the lungs every 4 (four) hours as needed for wheezing or shortness of breath.  . ANDROGEL PUMP 20.25 MG/ACT (1.62%) GEL Apply 1 application topically daily.   . Ascorbic Acid (VITAMIN C) 1000 MG tablet Take 1,000 mg by mouth 2 (two) times a day.  . b complex vitamins capsule Take 1 capsule by mouth daily.  . brinzolamide (AZOPT) 1 % ophthalmic suspension Place 1 drop into both eyes daily.   . bromocriptine (PARLODEL) 2.5 MG tablet Take 5 mg by mouth daily.   . Cinnamon 500 MG capsule Take 1,000 mg by mouth daily.  Marland Kitchen glucosamine-chondroitin 500-400 MG tablet Take 2 tablets by mouth daily.  . Lactobacillus (PROBIOTIC ACIDOPHILUS PO) Take by mouth daily.   Marland Kitchen latanoprost (XALATAN) 0.005 % ophthalmic solution Place 1 drop into both eyes at bedtime.   . Magnesium 500 MG TABS Take 1 tablet by mouth daily. Patient  says he takes magnesium orotate  . metolazone (ZAROXOLYN) 2.5 MG tablet Take 1 tablet (2.5 mg total) by mouth 2 (two) times a week.  . Omega-3 Fatty Acids (OMEGA ESSENTIALS BASIC) LIQD Take 15 mLs by mouth daily.  Marland Kitchen PARoxetine (PAXIL) 40 MG tablet Take 1 tablet (40 mg total) by mouth daily.  . quinapril (ACCUPRIL) 20 MG tablet TAKE 1 TABLET BY MOUTH EVERY DAY  . Spacer/Aero-Holding Chambers DEVI 1 Device by Does not apply route 4 (four) times daily.  Marland Kitchen spironolactone (ALDACTONE) 50 MG tablet Take 50 mg by mouth daily.  . potassium chloride SA (K-DUR,KLOR-CON) 20 MEQ tablet Take 2 tablets (40 mEq total) by mouth 2 (two) times daily.  Marland Kitchen  torsemide (DEMADEX) 20 MG tablet Take 3 tablets (60 mg total) by mouth 2 (two) times daily.   No facility-administered medications prior to visit.    Review of Systems  Constitutional: Positive for fever.  HENT: Positive for congestion and sore throat.   Respiratory: Negative.   Cardiovascular: Negative.   Musculoskeletal: Negative.       Objective    There were no vitals taken for this visit.   Physical Exam: WDWN male in no apparent distress.  Head: Normocephalic, atraumatic. 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 Respiratory: No apparent distress Psych: Normal mood and affect   Assessment & Plan    1. URI with cough and congestion Developed sore throat and chest congestion with cough over the past 3-4 days. No headache, stuffy head, earache, fever or chills. No loss of taste or smell. Suspect viral illness with possible allergy component. States symptoms are improved today. May use Mucinex-DM, Tylenol or Advil prn and add an antihistamine if needed. Is concerned about live-in girlfriends health and wants to be tested for COVID-19 despite having completed both COVID vaccinations in April 2021.  2. Sore throat No fever but having PND causing a scratchy throat. May use antihistamine for any allergy component and help dry up drainage. Gargle with warm saltwater for discomfort. Recheck if symptoms worsen. Planning evaluation and testing for COVID at a walk-in clinic or pharmacy.  No follow-ups on file.    I discussed the assessment and treatment plan with the patient. The patient was provided an opportunity to ask questions and all were answered. The patient agreed with the plan and demonstrated an understanding of the instructions.   The patient was advised to call back or seek an in-person evaluation if the symptoms worsen or if the condition fails to improve as anticipated.  I provided 15 minutes of non-face-to-face time during this encounter.  Andres Shad, PA, have reviewed all documentation for this visit. The documentation on 09/13/19 for the exam, diagnosis, procedures, and orders are all accurate and complete.   [name], [place] [phone] (phone) [phone] (fax)  [place]

## 2019-10-05 ENCOUNTER — Other Ambulatory Visit: Payer: Self-pay | Admitting: Cardiovascular Disease

## 2019-10-08 ENCOUNTER — Telehealth: Payer: Self-pay | Admitting: Physician Assistant

## 2019-10-08 NOTE — Telephone Encounter (Signed)
Will forward to scheduling and triage to assist.

## 2019-10-08 NOTE — Telephone Encounter (Signed)
Scott [name] is a 49 y.o. male with a hx of diastolic CHF, Klippel-Feil syndrome, restrictive lung disease and hypertension.  He notes a recent hx of interscapular back pain that occurs at night (11:30 pm).  This occurs while he is seated watching tv.  He notes low BP assoc with this (70s/40s) and his pupils are dilated.  He feels near syncopal but has not had syncope.  He has not had chest pain.  He is chronically short of breath without significant change.  He takes his accupril and spironolactone in the evening prior to these episodes occurring.  He has no pain during the day and has fairly optimal BPs (110/80).   PLAN:  I have asked him to cut his Accupril in 1/2 to 10 mg and take it in the AM (he can skip tonight's dose). I will ask for our office in [place] to get him in for evaluation tomorrow (7/6). I have advised him to go to the ED if he has any worsening symptoms. Richardson Dopp, PA-C    10/08/2019 12:32 PM

## 2019-10-09 ENCOUNTER — Encounter: Payer: Self-pay | Admitting: Family

## 2019-10-09 ENCOUNTER — Other Ambulatory Visit
Admission: RE | Admit: 2019-10-09 | Discharge: 2019-10-09 | Disposition: A | Discharge status: Home / Self Care | Payer: Medicare Other | Attending: Family | Admitting: Family

## 2019-10-09 ENCOUNTER — Other Ambulatory Visit: Payer: Self-pay

## 2019-10-09 ENCOUNTER — Ambulatory Visit (INDEPENDENT_AMBULATORY_CARE_PROVIDER_SITE_OTHER): Payer: Medicare Other | Admitting: Family

## 2019-10-09 DIAGNOSIS — I952 Hypotension due to drugs: Secondary | ICD-10-CM

## 2019-10-09 DIAGNOSIS — J984 Other disorders of lung: Secondary | ICD-10-CM | POA: Diagnosis not present

## 2019-10-09 DIAGNOSIS — I10 Essential (primary) hypertension: Secondary | ICD-10-CM | POA: Diagnosis not present

## 2019-10-09 DIAGNOSIS — I5033 Acute on chronic diastolic (congestive) heart failure: Secondary | ICD-10-CM | POA: Diagnosis present

## 2019-10-09 LAB — BASIC METABOLIC PANEL
Anion gap: 13 (ref 5–15)
BUN: 41 mg/dL — ABNORMAL HIGH (ref 6–20)
CO2: 30 mmol/L (ref 22–32)
Calcium: 8.7 mg/dL — ABNORMAL LOW (ref 8.9–10.3)
Chloride: 98 mmol/L (ref 98–111)
Creatinine, Ser: 1.61 mg/dL — ABNORMAL HIGH (ref 0.61–1.24)
GFR calc Af Amer: 57 mL/min — ABNORMAL LOW (ref >60)
GFR calc non Af Amer: 49 mL/min — ABNORMAL LOW (ref >60)
Glucose, Bld: 157 mg/dL — ABNORMAL HIGH (ref 70–99)
Potassium: 3.9 mmol/L (ref 3.5–5.1)
Sodium: 141 mmol/L (ref 135–145)

## 2019-10-09 LAB — CBC
HCT: 40 % (ref 39.0–52.0)
Hemoglobin: 13.4 g/dL (ref 13.0–17.0)
MCH: 30.8 pg (ref 26.0–34.0)
MCHC: 33.5 g/dL (ref 30.0–36.0)
MCV: 92 fL (ref 80.0–100.0)
Platelets: 328 10*3/uL (ref 150–400)
RBC: 4.35 MIL/uL (ref 4.22–5.81)
RDW: 13.6 % (ref 11.5–15.5)
WBC: 9.5 10*3/uL (ref 4.0–10.5)
nRBC: 0 % (ref 0.0–0.2)

## 2019-10-09 LAB — BRAIN NATRIURETIC PEPTIDE: B Natriuretic Peptide: 38.9 pg/mL (ref 0.0–100.0)

## 2019-10-09 MED ORDER — TORSEMIDE 20 MG PO TABS: 60.0000 mg | ORAL_TABLET | Freq: Every day | ORAL | 1 refills | Status: DC

## 2019-10-09 MED ORDER — QUINAPRIL HCL 20 MG PO TABS: ORAL_TABLET | ORAL | Status: DC

## 2019-10-09 MED ORDER — METOLAZONE 2.5 MG PO TABS: 2.5000 mg | ORAL_TABLET | ORAL | 1 refills | Status: DC

## 2019-10-09 NOTE — Telephone Encounter (Signed)
Thank you! Richardson Dopp, PA-C    10/09/2019 1:05 PM

## 2019-10-09 NOTE — Progress Notes (Signed)
Office Visit    Patient Name: Scott [name] Date of Encounter: 10/09/2019  Primary Care Provider:  Margo Common, PA Primary Cardiologist:  Scott Rogue, MD Electrophysiologist:  None   Chief Complaint    Scott [name] is a 49 y.o. male with a hx of diastolic heart failure, Klippel-Feil syndrome, restrictive lung disease, HTN, former tobacco use, HLD, asthma, depression, anxiety presents today for hypotension.    Past Medical History    Past Medical History:  Diagnosis Date   Acid reflux    Anxiety    Arthritis    Asthma    CHF (congestive heart failure) ([place])    Depression    Glaucoma    Hyperlipidemia    Hypertension    Klippel-Feil syndrome    Past Surgical History:  Procedure Laterality Date   COLOSTOMY     DENVER SHUNT PLACEMENT      Allergies  Allergies  Allergen Reactions   Codeine Shortness Of Breath    History of Present Illness    Scott [name] is a 49 y.o. male with a hx of diastolic heart failure, Klippel-Feil syndrome, restrictive lung disease, HTN, former tobacco use, HLD, asthma, depression, anxiety .  He was last seen 05/29/2019 by Dr. Rockey [name].  Echocardiogram 02/04/2018 LVEF 55 to 60%.  Echocardiogram was performed during hospitalizations with acute on chronic diastolic heart failure with hypoxia requiring IV Lasix.  Stress testing 02/09/2018 with no evidence of ischemia.  Sleep study 03/10/2018 negative for OSA.  Pulmonary function testing notable for severe restrictive lung disease.  At clinic visit in February he was recommended to add metolazone 2.5 mg twice a week for weight over 209 pounds.  He was referred to pulmonology.  Scott [name] called the after-hours line yesterday noting intrascapular back pain that occurs at night around 11:30 PM.  Associate with low blood pressure (70s over 40s) and dilated pupils.  Noted he feels near syncopal but no true syncope.  This has been happening more frequently. First occurred about three months  ago. Tells me when he gets low blood pressure readings he feels very sluggish. When it first occurred he stopped his bromocriptine, but did not notice a change.  His mother has been ill and hospitalized which he tells me creates lots of stress. Some chest tightness which he attributes to recent stress. Not exacerbated by activity. Tells me it occurs when he thinks about his mom being ill.   Has been taking his Torsemide when he can with travelling back and forth to see his mom. Yesterday took 4 tablets yesterday evening around 11pm. Not keeping up with twice daily dosing. He was worried his Spironolactone and Quinapril were causing his low blood pressure.   Tells me his "weight is up". He is +1 pound by our scale, but tells me he has lost weight since his last visit with Dr. Rockey [name] and he is up 5lbs overnight by his home scale. Has not taken his Torsemide today.   Reports no shortness of breath at rest. Endorses worsening DOE. Reports no chest pain, pressure. Does endorse abdominal bloating. No orthopnea, PND. Reports no palpitations.     EKGs/Labs/Other Studies Reviewed:   The following studies were reviewed today:  EKG:  EKG is ordered today.  The ekg ordered today demonstrates sinus tachycardia 107 bpm.   Recent Labs: 10/09/2019: B Natriuretic Peptide 38.9; BUN 41; Creatinine, Ser 1.61; Hemoglobin 13.4; Platelets 328; Potassium 3.9; Sodium 141  Recent Lipid Panel  Component Value Date/Time   CHOL 155 02/07/2018 0508   TRIG 74 02/07/2018 0508   HDL 55 02/07/2018 0508   CHOLHDL 2.8 02/07/2018 0508   VLDL 15 02/07/2018 0508   LDLCALC 85 02/07/2018 0508    Home Medications   Current Meds  Medication Sig   albuterol (PROVENTIL HFA;VENTOLIN HFA) 108 (90 Base) MCG/ACT inhaler Inhale 2 puffs into the lungs every 4 (four) hours as needed for wheezing or shortness of breath.   ANDROGEL PUMP 20.25 MG/ACT (1.62%) GEL Apply 1 application topically daily.    Ascorbic Acid (VITAMIN C) 1000  MG tablet Take 1,000 mg by mouth 2 (two) times a day.   b complex vitamins capsule Take 1 capsule by mouth daily.   brinzolamide (AZOPT) 1 % ophthalmic suspension Place 1 drop into both eyes daily.    bromocriptine (PARLODEL) 2.5 MG tablet Take 5 mg by mouth daily.    Cinnamon 500 MG capsule Take 1,000 mg by mouth daily.   glucosamine-chondroitin 500-400 MG tablet Take 2 tablets by mouth daily.   Lactobacillus (PROBIOTIC ACIDOPHILUS PO) Take by mouth daily.    latanoprost (XALATAN) 0.005 % ophthalmic solution Place 1 drop into both eyes at bedtime.    Magnesium 500 MG TABS Take 1 tablet by mouth daily. Patient says he takes magnesium orotate   Omega-3 Fatty Acids (OMEGA ESSENTIALS BASIC) LIQD Take 15 mLs by mouth daily.   PARoxetine (PAXIL) 40 MG tablet Take 1 tablet (40 mg total) by mouth daily.   potassium chloride SA (KLOR-CON) 20 MEQ tablet TAKE 2 TABLETS(40 MEQ) BY MOUTH TWICE DAILY   quinapril (ACCUPRIL) 20 MG tablet Take 10mg  (half tablet) at bedtime if your systolic blood pressure is more than 120. If your systolic blood pressure is less than 120, hold this medication.   Spacer/Aero-Holding Chambers DEVI 1 Device by Does not apply route 4 (four) times daily.   spironolactone (ALDACTONE) 50 MG tablet Take 50 mg by mouth daily.   [DISCONTINUED] quinapril (ACCUPRIL) 20 MG tablet Take 20 mg by mouth at bedtime.      Review of Systems    Review of Systems  Constitutional: Negative for chills, fever and malaise/fatigue.  Cardiovascular: Positive for dyspnea on exertion. Negative for chest pain, leg swelling, near-syncope, orthopnea, palpitations and syncope.  Respiratory: Negative for cough, shortness of breath and wheezing.   Gastrointestinal: Positive for bloating. Negative for nausea and vomiting.  Neurological: Positive for light-headedness. Negative for dizziness and weakness.   All other systems reviewed and are otherwise negative except as noted above.  Physical  Exam    VS:  There were no vitals taken for this visit. , BMI There is no height or weight on file to calculate BMI. GEN: Well nourished, well developed, in no acute distress. HEENT: normal. Neck: Supple, no JVD, carotid bruits, or masses. Cardiac: RRR, tachycardic, no murmurs, rubs, or gallops. No clubbing, cyanosis, edema.  Radials/DP/PT 2+ and equal bilaterally.  Respiratory:  Respirations regular and unlabored, clear to auscultation bilaterally. GI: Soft, nontender, nondistended MS: No deformity or atrophy. Skin: Warm and dry, no rash. Neuro:  Strength and sensation are intact. Psych: Normal affect.  Assessment & Plan    1. Chronic diastolic heart failure - NYHA II-III. Reports he is up 5 lbs overnight. Noted abdominal distention. Not taking Torsemide regularly due to visiting his mother in the hospital. Change Torsemide to 60mg  once daily, change Metolazone to 2.5mg  three times per week, continue Spironolactone 50mg  daily. Educated to drink less than  2L per day and follow low sodium diet. BNP, BMP, CBC today.   Pending response to changes in diuretic therapy consider echocardiogram at follow up to rule out worsening HF or valvular abnormality.   2. Tachycardia - Reports normal resting HR in the 80s. Reports tachycardia associated with hypotension. Adjustment to diuretics, as above. Reassess at next clinic visit. May need to consider adding beta blocker pending BP.   3. Chest tightness - Reports chest tightness associated with anxiety about his mother's health. Low suspicion anginal chest pain as it does not occur with activity and is not associated with dyspnea nor diaphoresis. EKG today with no acute ST/T wave changes. Encouraged to proceed to ED if he has worsening chest pain.   4. HTN - Now with hypotension likely due to taking his Spironolactone, Torsemide, Quinapril all at the same time. Reduce Quinapril 10mg  QHS if his SBP is >120. If SBP <120, he will hold this medication.    5. Restrictive lung disease - Previously referred to pulmonology. Unclear whether he made appointment. Will discuss at follow up.   6. Obesity - Weight loss via diet and exercise encouraged.   Disposition: Follow up in 1 week(s) with Dr. Arta Bruce as previously scheduled.   Loel Dubonnet, NP 10/09/2019, 7:23 PM

## 2019-10-09 NOTE — Patient Instructions (Signed)
Medication Instructions:  Your physician has recommended you make the following change in your medication:   CHANGE Torsemide to 60mg  (three tablets) once per day  CHANGE Metolazone to 2.5mg  (one tablet) three times per week  CONTINUE Spironolactone 50mg  daily  CHANGE Quinapril (Accupril) to half tablet (10mg ) daily in the evening.   If your systolic blood pressure (the top number) is less than 120, don't take your Quinapril  If your systolic blood pressure (the top number) is more than 120, take the half tablet of Quinapril  *If you need a refill on your cardiac medications before your next appointment, please call your pharmacy*  Lab Work: Your physician recommends lab work today: BNP, BMP, CBC  If you have labs (blood work) drawn today and your tests are completely normal, you will receive your results only by: Marland Kitchen MyChart Message (if you have MyChart) OR . A paper copy in the mail If you have any lab test that is abnormal or we need to change your treatment, we will call you to review the results.  Testing/Procedures: Your EKG today shows sinus tachycardia.   Follow-Up: At [facility], you and your health needs are our priority.  As part of our continuing mission to provide you with exceptional heart care, we have created designated Provider Care Teams.  These Care Teams include your primary Cardiologist (physician) and Advanced Practice Providers (APPs -  Physician Assistants and Nurse Practitioners) who all work together to provide you with the care you need, when you need it.  We recommend signing up for the patient portal called "MyChart".  Sign up information is provided on this After Visit Summary.  MyChart is used to connect with patients for Virtual Visits (Telemedicine).  Patients are able to view lab/test results, encounter notes, upcoming appointments, etc.  Non-urgent messages can be sent to your provider as well.   To learn more about what you can do with MyChart, go  to [email or web address].    Your next appointment:   1 week(s)  The format for your next appointment:   In Person  Provider:   Ida Rogue, MD  Other Instructions  Continue low salt diet  Recommend you drink less than 2L per day

## 2019-10-09 NOTE — Telephone Encounter (Signed)
Patient has been scheduled with Laurann Montana today at 3:30

## 2019-10-09 NOTE — Telephone Encounter (Signed)
Patient is being seen today. Closing this encounter.

## 2019-10-10 ENCOUNTER — Other Ambulatory Visit: Payer: Self-pay | Admitting: *Deleted

## 2019-10-10 MED ORDER — METOLAZONE 2.5 MG PO TABS: 2.5000 mg | ORAL_TABLET | ORAL | 1 refills | Status: DC

## 2019-10-13 NOTE — Progress Notes (Deleted)
NO SHOW

## 2019-10-15 ENCOUNTER — Ambulatory Visit: Payer: Medicare Other | Admitting: Cardiovascular Disease

## 2019-10-17 ENCOUNTER — Encounter: Payer: Self-pay | Admitting: Cardiovascular Disease

## 2019-10-26 ENCOUNTER — Telehealth: Payer: Self-pay | Admitting: Cardiovascular Disease

## 2019-10-26 NOTE — Telephone Encounter (Signed)
Call to patient to make him aware of POC>   No answer. Left vm to call back if he prefers to have labs (BMP) prior to OV.   Advised pt to call for any further questions or concerns.

## 2019-10-26 NOTE — Telephone Encounter (Signed)
He can do labs at follow up with Dr. Rockey Situ or a BMP prior. Whichever is easier for his schedule.   Laurann Montana, NP

## 2019-10-26 NOTE — Telephone Encounter (Signed)
Patient calling  Per C. Walker patient was to have repeat labs at next appointment with Dr Rockey Situ on 7/12 Patient missed appointment and has now rescheduled for 8/20 but wants to know if he should have orders placed so he can go ahead and get labs  Please call to discuss

## 2019-11-19 NOTE — Progress Notes (Deleted)
Cardiology Office Note    Date:  11/19/2019   ID:  Scott [name], DOB [birth year], MRN [id number]  PCP:  Margo Common, PA  Cardiologist:  Ida Rogue, MD  Electrophysiologist:  None   Chief Complaint: ***  History of Present Illness:   Scott [name] is a 49 y.o. male with history of Klippel-File syndrome, restrictive lung disease and airway hyperactivity, HFpEF, HTN, HLD, asthma, alopecia, migraine disorder, open-angle glaucoma, depression,arthritis, anxiety, obesity and GERD who presents for ***.  He was seen in the ED in 02/2018 for dyspnea and subsequently diagnosed with HFpEF. Echo in 02/2018 showed an EF of 55-60%. Lexiscan MPI in 02/2018 showed no evidence of ischemia. Sleep study in 03/2018 showed no evidence of sleep apnea. Pulmonary function testing has shown severe restrictive lung disease. He was previously followed by Dr. Fletcher Anon, though has subsequently transitioned to Dr. Rockey Situ. He is also followed by the [facility]. He was last seen in the office in 10/2019, for evaluation of low BP with associated fatigue and presyncope. He also noted chest tightness which he attributed to increased stress surrounding his mother's health. With traveling to see his mother, he was not taking torsemide as directed. His weight was up 1 pound by our scale, and up 5 pounds by his scale at home. Changes at that visit included: torsemide was changed to 60 mg daily (previously 60 mg bid), metolazone was changed to 2.5 mg three times per week (previously 2x/week), and quinapril was decreased to 10 mg (previously 20 mg).   ***   Labs independently reviewed: 10/2019 - potassium 3.9, BUN 41, serum creatinine 1.61 (baseline around 1.1-1.3), Hgb 13.4, PLT 328, BNP 38  Past Medical History:  Diagnosis Date  . Acid reflux   . Anxiety   . Arthritis   . Asthma   . CHF (congestive heart failure) ([place])   . Depression   . Glaucoma   . Hyperlipidemia   . Hypertension   . Klippel-Feil  syndrome     Past Surgical History:  Procedure Laterality Date  . COLOSTOMY    . DENVER SHUNT PLACEMENT      Current Medications: No outpatient medications have been marked as taking for the 11/23/19 encounter (Appointment) with Rise Mu, PA-C.    Allergies:   Codeine   Social History   Socioeconomic History  . Marital status: Single    Spouse name: Not on file  . Number of children: 0  . Years of education: Not on file  . Highest education level: Some college, no degree  Occupational History  . Occupation: disability  Tobacco Use  . Smoking status: Former Smoker    Packs/day: 1.00    Years: 10.00    Pack years: 10.00    Types: Cigarettes    Quit date: 05/2006    Years since quitting: 13.5  . Smokeless tobacco: Never Used  . Tobacco comment: quit 05/2006  Vaping Use  . Vaping Use: Former  . Devices: tried once  Substance and Sexual Activity  . Alcohol use: No    Alcohol/week: 0.0 standard drinks  . Drug use: No  . Sexual activity: Not on file  Other Topics Concern  . Not on file  Social History Narrative  . Not on file   Social Determinants of Health   Financial Resource Strain: Medium Risk  . Difficulty of Paying Living Expenses: Somewhat hard  Food Insecurity: No Food Insecurity  . Worried About Charity fundraiser in  the Last Year: Never true  . Ran Out of Food in the Last Year: Never true  Transportation Needs: No Transportation Needs  . Lack of Transportation (Medical): No  . Lack of Transportation (Non-Medical): No  Physical Activity: Inactive  . Days of Exercise per Week: 0 days  . Minutes of Exercise per Session: 0 min  Stress: Stress Concern Present  . Feeling of Stress : Very much  Social Connections: Moderately Isolated  . Frequency of Communication with Friends and Family: Three times a week  . Frequency of Social Gatherings with Friends and Family: Never  . Attends Religious Services: Never  . Active Member of Clubs or Organizations:  No  . Attends Archivist Meetings: Never  . Marital Status: Living with partner     Family History:  The patient's family history includes Cancer in his paternal grandfather; Colon cancer in his father; Heart disease in his father; Lung cancer in his maternal grandmother; Multiple sclerosis in his mother; Ovarian cancer in his paternal grandmother; Thyroid cancer in his maternal grandfather.  ROS:   ROS   EKGs/Labs/Other Studies Reviewed:    Studies reviewed were summarized above. The additional studies were reviewed today:  2D echo 02/2018: - Left ventricle: The cavity size was normal. Wall thickness was  normal. Systolic function was normal. The estimated ejection  fraction was in the range of 55% to 60%. Wall motion was normal;  there were no regional wall motion abnormalities. Doppler  parameters are consistent with abnormal left ventricular  relaxation (grade 1 diastolic dysfunction).  - Left atrium: The atrium was normal in size.  - Right ventricle: Systolic function was normal.  - Pulmonary arteries: Systolic pressure could not be accurately  estimated.  ___________  Carlton Adam MPI 02/2018: Pharmacological myocardial perfusion imaging study with no significant  Ischemia Small region of fixed apical defect, likely attenuation artifact. Normal wall motion, EF estimated at 35% (depressed secondary to GI uptake artifact) No EKG changes concerning for ischemia at peak stress or in recovery. Low risk scan, normal EF on echocardiogram, >55%   EKG:  EKG is ordered today.  The EKG ordered today demonstrates ***  Recent Labs: 10/09/2019: B Natriuretic Peptide 38.9; BUN 41; Creatinine, Ser 1.61; Hemoglobin 13.4; Platelets 328; Potassium 3.9; Sodium 141  Recent Lipid Panel    Component Value Date/Time   CHOL 155 02/07/2018 0508   TRIG 74 02/07/2018 0508   HDL 55 02/07/2018 0508   CHOLHDL 2.8 02/07/2018 0508   VLDL 15 02/07/2018 0508   LDLCALC 85  02/07/2018 0508    PHYSICAL EXAM:    VS:  There were no vitals taken for this visit.  BMI: There is no height or weight on file to calculate BMI.  Physical Exam  Wt Readings from Last 3 Encounters:  05/29/19 216 lb 8 oz (98.2 kg)  05/14/19 220 lb (99.8 kg)  09/20/18 205 lb (93 kg)     ASSESSMENT & PLAN:   1. HFpEF:  2. ***  3. HTN: Blood pressure ***  4. Restrictive lung disease:  5. Obesity:  Disposition: F/u with Dr. Rockey Situ or an APP in ***.   Medication Adjustments/Labs and Tests Ordered: Current medicines are reviewed at length with the patient today.  Concerns regarding medicines are outlined above. Medication changes, Labs and Tests ordered today are summarized above and listed in the Patient Instructions accessible in Encounters.   Signed, Christell Faith, PA-C 11/19/2019 11:48 AM     [facility] [address]  [place], [place] [address] [phone]

## 2019-11-22 ENCOUNTER — Ambulatory Visit: Payer: Medicare Other | Admitting: Family

## 2019-11-23 ENCOUNTER — Encounter: Payer: Self-pay | Admitting: Physician Assistant

## 2019-11-23 ENCOUNTER — Other Ambulatory Visit: Payer: Self-pay

## 2019-11-23 ENCOUNTER — Ambulatory Visit (INDEPENDENT_AMBULATORY_CARE_PROVIDER_SITE_OTHER): Payer: Medicare Other | Admitting: Physician Assistant

## 2019-11-23 ENCOUNTER — Ambulatory Visit: Payer: Medicare Other | Admitting: Physician Assistant

## 2019-11-23 VITALS — BP 110/76 | HR 108 | Ht 62.0 in | Wt 214.8 lb

## 2019-11-23 DIAGNOSIS — I10 Essential (primary) hypertension: Secondary | ICD-10-CM | POA: Diagnosis not present

## 2019-11-23 DIAGNOSIS — N179 Acute kidney failure, unspecified: Secondary | ICD-10-CM | POA: Diagnosis not present

## 2019-11-23 DIAGNOSIS — J984 Other disorders of lung: Secondary | ICD-10-CM | POA: Diagnosis not present

## 2019-11-23 DIAGNOSIS — I5032 Chronic diastolic (congestive) heart failure: Secondary | ICD-10-CM | POA: Diagnosis not present

## 2019-11-23 DIAGNOSIS — R0789 Other chest pain: Secondary | ICD-10-CM

## 2019-11-23 NOTE — Patient Instructions (Signed)
Medication Instructions:  Your physician recommends that you continue on your current medications as directed. Please refer to the Current Medication list given to you today.  *If you need a refill on your cardiac medications before your next appointment, please call your pharmacy*   Lab Work: Your physician recommends that you have lab work today(BNP, CMET)  If you have labs (blood work) drawn today and your tests are completely normal, you will receive your results only by:  MyChart Message (if you have MyChart) OR  A paper copy in the mail If you have any lab test that is abnormal or we need to change your treatment, we will call you to review the results.   Testing/Procedures: None ordered   Follow-Up: At [facility], you and your health needs are our priority.  As part of our continuing mission to provide you with exceptional heart care, we have created designated Provider Care Teams.  These Care Teams include your primary Cardiologist (physician) and Advanced Practice Providers (APPs -  Physician Assistants and Nurse Practitioners) who all work together to provide you with the care you need, when you need it.  We recommend signing up for the patient portal called "MyChart".  Sign up information is provided on this After Visit Summary.  MyChart is used to connect with patients for Virtual Visits (Telemedicine).  Patients are able to view lab/test results, encounter notes, upcoming appointments, etc.  Non-urgent messages can be sent to your provider as well.   To learn more about what you can do with MyChart, go to [email or web address].    Your next appointment:   2 month(s)  The format for your next appointment:   In Person  Provider:    You may see Ida Rogue, MD or Christell Faith, PA-C

## 2019-11-23 NOTE — Progress Notes (Signed)
Cardiology Office Note    Date:  11/23/2019   ID:  Scott [name], DOB 03-22-1971, MRN [id number]  PCP:  Margo Common, PA  Cardiologist:  Ida Rogue, MD  Electrophysiologist:  None   Chief Complaint: Follow-up  History of Present Illness:   Scott [name] is a 49 y.o. male with history of Klippel-File syndrome, restrictive lung disease and airway hyperactivity, HFpEF, HTN, HLD, asthma, alopecia, migraine disorder, open-angle glaucoma, depression,arthritis, anxiety, obesity and GERD who presents for follow-up of HFpEF.  He was seen in the ED in 02/2018 for dyspnea and subsequently diagnosed with HFpEF. Echo in 02/2018 showed an EF of 55-60%. Lexiscan MPI in 02/2018 showed no evidence of ischemia. Sleep study in 03/2018 showed no evidence of sleep apnea. Pulmonary function testing has shown severe restrictive lung disease. He was previously followed by Dr. Fletcher Anon, though has subsequently transitioned to Dr. Rockey Situ. He is also followed by the [facility]. He was last seen in the office in 10/2019, for evaluation of low BP with associated fatigue and presyncope. He also noted chest tightness which he attributed to increased stress surrounding his mother's health. With traveling to see his mother, he was not taking torsemide as directed. His weight was up 1 pound by our scale, and up 5 pounds by his scale at home. Changes at that visit included: torsemide was changed to 60 mg daily (previously 60 mg bid), metolazone was changed to 2.5 mg three times per week (previously 2x/week), and quinapril was decreased to 10 mg (previously 20 mg).  Labs obtained at that time showed mild AKI and normal BNP as outlined below.  He missed his follow-up in 10/2019 in the setting of his mother's passing.  He comes in today noting several issues.  1) He was recently fairly constipated and took an OTC laxative.  This did help with his bowel movements, though following this he developed some left upper  quadrant abdominal pain that is worse with laughing, coughing, or sneezing.  He has not discussed this with his PCP yet.  2) He has noted some bilateral anterior chest burning sensations, particularly along the bilateral nipples it is worse to palpation with associated erythema.  He denies any exertional chest discomfort.  He reports his baseline dyspnea is stable to somewhat improved, particularly today.  He reports a good dry weight for him is approximately 208 pounds with a weight this morning of 210 pounds.  Following his last visit he ultimately ended up going back on torsemide 60 mg twice daily as he did not feel like he had good urine response with this.  He has continued to take metolazone 2.5 mg 3 times per week along with spironolactone 50 mg daily, and KCl 40 mEq twice daily.  Since he was last seen he has not needed any as needed Accupril.  He denies any lower extremity swelling though does feel like his abdomen is bloated.  He does not add extra salt to food and drinks approximately 2 L of fluid per day.  He has not yet heard from his referral to pulmonology.   Labs independently reviewed: 10/2019 - potassium 3.9, BUN 41, serum creatinine 1.61 (baseline around 1.1-1.3), Hgb 13.4, PLT 328, BNP 38   Past Medical History:  Diagnosis Date  . Acid reflux   . Anxiety   . Arthritis   . Asthma   . CHF (congestive heart failure) ([place])   . Depression   . Glaucoma   . Hyperlipidemia   .  Hypertension   . Klippel-Feil syndrome     Past Surgical History:  Procedure Laterality Date  . COLOSTOMY    . DEN[place] SHUNT PLACEMENT      Current Medications: Current Meds  Medication Sig  . albuterol (PROVENTIL HFA;VENTOLIN HFA) 108 (90 Base) MCG/ACT inhaler Inhale 2 puffs into the lungs every 4 (four) hours as needed for wheezing or shortness of breath.  . ANDROGEL PUMP 20.25 MG/ACT (1.62%) GEL Apply 1 application topically daily.   . Ascorbic Acid (VITAMIN C) 1000 MG tablet Take 1,000 mg by  mouth 2 (two) times a day.  . b complex vitamins capsule Take 1 capsule by mouth daily.  . brinzolamide (AZOPT) 1 % ophthalmic suspension Place 1 drop into both eyes daily.   Marland Kitchen glucosamine-chondroitin 500-400 MG tablet Take 2 tablets by mouth daily.  . Lactobacillus (PROBIOTIC ACIDOPHILUS PO) Take by mouth daily.   Marland Kitchen latanoprost (XALATAN) 0.005 % ophthalmic solution Place 1 drop into both eyes at bedtime.   . Magnesium 500 MG TABS Take 1 tablet by mouth daily. Patient says he takes magnesium orotate  . metolazone (ZAROXOLYN) 2.5 MG tablet Take 1 tablet (2.5 mg total) by mouth 3 (three) times a week.  . Omega-3 Fatty Acids (OMEGA ESSENTIALS BASIC) LIQD Take 15 mLs by mouth daily.  Marland Kitchen PARoxetine (PAXIL) 40 MG tablet Take 1 tablet (40 mg total) by mouth daily.  . potassium chloride SA (KLOR-CON) 20 MEQ tablet TAKE 2 TABLETS(40 MEQ) BY MOUTH TWICE DAILY  . Spacer/Aero-Holding Chambers DEVI 1 Device by Does not apply route 4 (four) times daily.  Marland Kitchen spironolactone (ALDACTONE) 50 MG tablet Take 50 mg by mouth daily.  Marland Kitchen torsemide (DEMADEX) 20 MG tablet Take 3 tablets (60 mg total) by mouth daily.    Allergies:   Codeine   Social History   Socioeconomic History  . Marital status: Single    Spouse name: Not on file  . Number of children: 0  . Years of education: Not on file  . Highest education level: Some college, no degree  Occupational History  . Occupation: disability  Tobacco Use  . Smoking status: Former Smoker    Packs/day: 1.00    Years: 10.00    Pack years: 10.00    Types: Cigarettes    Quit date: 05/2006    Years since quitting: 13.5  . Smokeless tobacco: Never Used  . Tobacco comment: quit 05/2006  Vaping Use  . Vaping Use: Former  . Devices: tried once  Substance and Sexual Activity  . Alcohol use: No    Alcohol/week: 0.0 standard drinks  . Drug use: No  . Sexual activity: Not on file  Other Topics Concern  . Not on file  Social History Narrative  . Not on file    Social Determinants of Health   Financial Resource Strain: Medium Risk  . Difficulty of Paying Living Expenses: Somewhat hard  Food Insecurity: No Food Insecurity  . Worried About Charity fundraiser in the Last Year: Never true  . Ran Out of Food in the Last Year: Never true  Transportation Needs: No Transportation Needs  . Lack of Transportation (Medical): No  . Lack of Transportation (Non-Medical): No  Physical Activity: Inactive  . Days of Exercise per Week: 0 days  . Minutes of Exercise per Session: 0 min  Stress: Stress Concern Present  . Feeling of Stress : Very much  Social Connections: Moderately Isolated  . Frequency of Communication with Friends and Family: Three times  a week  . Frequency of Social Gatherings with Friends and Family: Never  . Attends Religious Services: Never  . Active Member of Clubs or Organizations: No  . Attends Archivist Meetings: Never  . Marital Status: Living with partner     Family History:  The patient's family history includes Cancer in his paternal grandfather; Colon cancer in his father; Heart disease in his father; Lung cancer in his maternal grandmother; Multiple sclerosis in his mother; Ovarian cancer in his paternal grandmother; Thyroid cancer in his maternal grandfather.  ROS:   Review of Systems  Constitutional: Positive for malaise/fatigue. Negative for chills, diaphoresis, fever and weight loss.  HENT: Negative for congestion.   Eyes: Negative for discharge and redness.  Respiratory: Positive for shortness of breath. Negative for cough, hemoptysis, sputum production and wheezing.   Cardiovascular: Negative for chest pain, palpitations, orthopnea, claudication, leg swelling and PND.  Gastrointestinal: Positive for constipation. Negative for abdominal pain, blood in stool, diarrhea, heartburn, melena, nausea and vomiting.       Abdominal bloating  Genitourinary: Negative for hematuria.  Musculoskeletal: Negative for  falls and myalgias.  Skin: Positive for rash.       Bilateral nipple soreness  Neurological: Positive for weakness. Negative for dizziness, tingling, tremors, sensory change, speech change, focal weakness and loss of consciousness.  Endo/Heme/Allergies: Does not bruise/bleed easily.  Psychiatric/Behavioral: Negative for substance abuse. The patient is not nervous/anxious.   All other systems reviewed and are negative.    EKGs/Labs/Other Studies Reviewed:    Studies reviewed were summarized above. The additional studies were reviewed today:  2D echo 02/2018: - Left ventricle: The cavity size was normal. Wall thickness was  normal. Systolic function was normal. The estimated ejection  fraction was in the range of 55% to 60%. Wall motion was normal;  there were no regional wall motion abnormalities. Doppler  parameters are consistent with abnormal left ventricular  relaxation (grade 1 diastolic dysfunction).  - Left atrium: The atrium was normal in size.  - Right ventricle: Systolic function was normal.  - Pulmonary arteries: Systolic pressure could not be accurately  estimated.  ___________  Carlton Adam MPI 02/2018: Pharmacological myocardial perfusion imaging study with no significant  Ischemia Small region of fixed apical defect, likely attenuation artifact. Normal wall motion, EF estimated at 35% (depressed secondary to GI uptake artifact) No EKG changes concerning for ischemia at peak stress or in recovery. Low risk scan, normal EF on echocardiogram, >55%   EKG:  EKG is ordered today.  The EKG ordered today demonstrates sinus tachycardia, 1 4 bpm, LVH, poor progression along the precordial leads, no acute ST-T changes  Recent Labs: 10/09/2019: B Natriuretic Peptide 38.9; BUN 41; Creatinine, Ser 1.61; Hemoglobin 13.4; Platelets 328; Potassium 3.9; Sodium 141  Recent Lipid Panel    Component Value Date/Time   CHOL 155 02/07/2018 0508   TRIG 74 02/07/2018 0508   HDL  55 02/07/2018 0508   CHOLHDL 2.8 02/07/2018 0508   VLDL 15 02/07/2018 0508   LDLCALC 85 02/07/2018 0508    PHYSICAL EXAM:    VS:  BP 110/76 (BP Location: Left Arm, Patient Position: Sitting, Cuff Size: Normal)   Pulse (!) 108   Ht 5\' 2"  (1.575 m)   Wt 214 lb 12.8 oz (97.4 kg)   SpO2 94%   BMI 39.29 kg/m   BMI: Body mass index is 39.29 kg/m.  Physical Exam Constitutional:      Appearance: He is well-developed.  HENT:  Head: Normocephalic and atraumatic.  Eyes:     General:        Right eye: No discharge.        Left eye: No discharge.  Neck:     Vascular: No JVD.  Cardiovascular:     Rate and Rhythm: Regular rhythm. Tachycardia present.     Pulses: No midsystolic click and no opening snap.          Posterior tibial pulses are 2+ on the right side and 2+ on the left side.     Heart sounds: Normal heart sounds, S1 normal and S2 normal. Heart sounds not distant. No murmur heard.  No friction rub.  Pulmonary:     Effort: Pulmonary effort is normal. No respiratory distress.     Breath sounds: Normal breath sounds. No decreased breath sounds, wheezing or rales.  Chest:     Chest wall: No tenderness.     Comments: Mild erythema surrounding the bilateral nipples with signs of irritation noted.  Palpation to the anterior chest wall reproduces his burning sensation. Abdominal:     General: There is no distension.     Palpations: Abdomen is soft.     Tenderness: There is no abdominal tenderness.  Musculoskeletal:     Cervical back: Normal range of motion.  Skin:    General: Skin is warm and dry.     Nails: There is no clubbing.  Neurological:     Mental Status: He is alert and oriented to person, place, and time.  Psychiatric:        Speech: Speech normal.        Behavior: Behavior normal.        Thought Content: Thought content normal.        Judgment: Judgment normal.     Wt Readings from Last 3 Encounters:  11/23/19 214 lb 12.8 oz (97.4 kg)  05/29/19 216 lb 8 oz  (98.2 kg)  05/14/19 220 lb (99.8 kg)     ASSESSMENT & PLAN:   1. HFpEF: He appears euvolemic and well compensated.  His weight is down 2 pounds when compared to his visit in 05/2019.  Since he was last seen, he has self increased his torsemide from 60 mg daily back to 60 mg twice daily and continued metolazone 2.5 mg 3 times weekly, rather than the previous dose of twice weekly as well as spironolactone 50 mg daily.  In this setting, we will obtain a BMP with possible need to de-escalate diuretic therapy.  His underlying chronic dyspnea at this point is likely related to his severe restrictive lung disease, for which she has been referred to pulmonology for evaluation.  I suspect the patient's mildly sinus tachycardic heart rates are multifactorial including obesity, physical deconditioning, and restrictive lung disease.  2. Atypical chest pain: Patient notes a burning sensation and irritation with shirts along his bilateral nipples.  Patient's symptoms are reproducible to palpation with mild erythema noted around his nipples.  Consider wearing shirt that is not so tight to reduce irritation.  Could also place Band-Aid over the nipple.  Recommend he follow-up with his PCP.  3. HTN: Blood pressure is well controlled in the office today.  4. Restrictive lung disease: Significantly contributing to his underlying dyspnea.  He has been referred to pulmonology, though I do not see she has an appointment scheduled.  We will have our office look into this, as he needs to establish with a pulmonologist.  5. AKI: Last BMP showed a  BUN/SCr 41/1.61 with baseline serum creatinine around 1.1-1.3.  Since he was last seen, he self titrated torsemide back to 60 mg twice daily and has continued to take metolazone 2.5 mg 3 times per week along with spironolactone 50 mg daily, and KCl 40 mEq twice daily.  Check BMP.  6. Obesity: Weight loss is advised.  Disposition: F/u with Dr. Rockey Situ or an APP in 2  months.   Medication Adjustments/Labs and Tests Ordered: Current medicines are reviewed at length with the patient today.  Concerns regarding medicines are outlined above. Medication changes, Labs and Tests ordered today are summarized above and listed in the Patient Instructions accessible in Encounters.   Signed, Christell Faith, PA-C 11/23/2019 1:58 PM     [address] [place], [place] [address] [phone]

## 2019-11-24 LAB — COMPREHENSIVE METABOLIC PANEL
ALT: 28 IU/L (ref 0–44)
AST: 23 IU/L (ref 0–40)
Albumin/Globulin Ratio: 1.4 (ref 1.2–2.2)
Albumin: 4.3 g/dL (ref 4.0–5.0)
Alkaline Phosphatase: 118 IU/L (ref 48–121)
BUN/Creatinine Ratio: 24 — ABNORMAL HIGH (ref 9–20)
BUN: 29 mg/dL — ABNORMAL HIGH (ref 6–24)
Bilirubin Total: 0.3 mg/dL (ref 0.0–1.2)
CO2: 34 mmol/L — ABNORMAL HIGH (ref 20–29)
Calcium: 10.6 mg/dL — ABNORMAL HIGH (ref 8.7–10.2)
Chloride: 85 mmol/L — ABNORMAL LOW (ref 96–106)
Creatinine, Ser: 1.22 mg/dL (ref 0.76–1.27)
GFR calc Af Amer: 80 mL/min/{1.73_m2} (ref >59)
GFR calc non Af Amer: 69 mL/min/{1.73_m2} (ref >59)
Globulin, Total: 3 g/dL (ref 1.5–4.5)
Glucose: 149 mg/dL — ABNORMAL HIGH (ref 65–99)
Potassium: 3.5 mmol/L (ref 3.5–5.2)
Sodium: 135 mmol/L (ref 134–144)
Total Protein: 7.3 g/dL (ref 6.0–8.5)

## 2019-11-24 LAB — BRAIN NATRIURETIC PEPTIDE: BNP: 23.6 pg/mL (ref 0.0–100.0)

## 2019-11-26 ENCOUNTER — Telehealth: Payer: Self-pay

## 2019-11-26 DIAGNOSIS — I5032 Chronic diastolic (congestive) heart failure: Secondary | ICD-10-CM

## 2019-11-26 NOTE — Telephone Encounter (Signed)
-----   Message from Theora Gianotti, NP sent at 11/26/2019  2:57 PM EDT ----- Renal fxn stable. BNP is normal suggesting normal fluid status.  Potassium is on the low side of normal at 3.5.  This is likely secondary to the additional metolazone.  Continue KCl 40 BID but on metolazone days, he should take it TID.  F/u bmet to assess stability in 2 wks.

## 2019-11-26 NOTE — Telephone Encounter (Signed)
Note sent to patient mychart to make him aware of labs needed in 2 weeks. Order placed.

## 2019-11-27 ENCOUNTER — Ambulatory Visit: Payer: Self-pay

## 2019-11-27 NOTE — Telephone Encounter (Signed)
Left message to call back  

## 2019-11-27 NOTE — Telephone Encounter (Signed)
Summary: Clinical Advice   Patient would like to speak with nurse I nregards to abdominal pain that patient feels is gastro related and not being able to use the bathroom. Please advise      Pt. Reports he has had problems with constipation in the past. Has been having issues with hard stools and passing "small amounts of stool." Took OTC from [place], with very little relief. Having discomfort left lower side when he coughs at times. Afraid to "take a bunch of laxatives with it hurting." Passed stool last night. Requests to be seen. No availability in the practice this week. Pt. Would like to be worked in. Please advise pt.  Reason for Disposition . Abdomen is more swollen than usual  Answer Assessment - Initial Assessment Questions 1. STOOL PATTERN OR FREQUENCY: "How often do you pass bowel movements (BMs)?"  (Normal range: tid to q 3 days)  "When was the last BM passed?"       Daily 2. STRAINING: "Do you have to strain to have a BM?"      Yes 3. RECTAL PAIN: "Does your rectum hurt when the stool comes out?" If Yes, ask: "Do you have hemorrhoids? How bad is the pain?"  (Scale 1-10; or mild, moderate, severe)     Yes 4. STOOL COMPOSITION: "Are the stools hard?"      Yes 5. BLOOD ON STOOLS: "Has there been any blood on the toilet tissue or on the surface of the BM?" If Yes, ask: "When was the last time?"      No 6. CHRONIC CONSTIPATION: "Is this a new problem for you?"  If no, ask: "How long have you had this problem?" (days, weeks, months)      Yes 7. CHANGES IN DIET OR HYDRATION: "Have there been any recent changes in your diet?" "How much fluids are you drinking consuming on a daily basis?"  "How much have you had to drink today?"     No 8. MEDICATIONS: "Have you been taking any new medications?" "Are you taking any narcotic pain medications?" (e.g., Vicoden, Percocet, morphine, dilaudid)     No 9. LAXATIVES: "Have you been using any stool softeners, laxatives, or enemas?"  If yes, ask  "What, how often, and when was the last time?" 10.ACTIVITY:  "How much walking do you do every day? on a daily basis?"  "Has your activity level decreased in the past week?"        OTC 11. CAUSE: "What do you think is causing the constipation?"        Unsure 12. OTHER SYMPTOMS: "Do you have any other symptoms?" (e.g., abdominal pain, bloating, fever, vomiting)       Abdominal pain 13. MEDICAL HISTORY: "Do you have a history of hemorrhoids, rectal fissures, or rectal surgery or rectal abscess?"         No 14. PREGNANCY: "Is there any chance you are pregnant?" "When was your last menstrual period?"       N/a  Protocols used: CONSTIPATION-A-AH

## 2019-11-29 NOTE — Telephone Encounter (Addendum)
I called and spoke with patient. He is still constipated. His last bowel movement was yesterday. That bowel movement was a small amount of stool and was hard. He is able to pass gas.  Appointment scheduled with Dr. Caryn Section Friday 11/30/2019 at 2:20pm. Patient advised to go to ER if abdominal pain becomes severe or worsens,  or he develops nausea or vomiting. Patient verbalized understanding.

## 2019-11-30 ENCOUNTER — Other Ambulatory Visit: Payer: Self-pay

## 2019-11-30 ENCOUNTER — Ambulatory Visit
Admission: RE | Admit: 2019-11-30 | Discharge: 2019-11-30 | Disposition: A | Discharge status: Home / Self Care | Payer: Medicare Other | Source: Ambulatory Visit | Attending: Family Medicine | Admitting: Family Medicine

## 2019-11-30 ENCOUNTER — Ambulatory Visit (INDEPENDENT_AMBULATORY_CARE_PROVIDER_SITE_OTHER): Payer: Medicare Other | Admitting: Family Medicine

## 2019-11-30 ENCOUNTER — Encounter: Payer: Self-pay | Admitting: Family Medicine

## 2019-11-30 ENCOUNTER — Ambulatory Visit
Admission: RE | Admit: 2019-11-30 | Discharge: 2019-11-30 | Disposition: A | Discharge status: Home / Self Care | Payer: Medicare Other | Attending: Family Medicine | Admitting: Family Medicine

## 2019-11-30 VITALS — BP 119/78 | HR 118 | Temp 98.2°F | Resp 24 | Wt 215.0 lb

## 2019-11-30 DIAGNOSIS — K5900 Constipation, unspecified: Secondary | ICD-10-CM | POA: Diagnosis not present

## 2019-11-30 DIAGNOSIS — R109 Unspecified abdominal pain: Secondary | ICD-10-CM | POA: Diagnosis not present

## 2019-11-30 DIAGNOSIS — R1084 Generalized abdominal pain: Secondary | ICD-10-CM

## 2019-11-30 NOTE — Progress Notes (Signed)
I,Roshena L Chambers,acting as a scribe for Lelon Huh, MD.,have documented all relevant documentation on the behalf of Lelon Huh, MD,as directed by  Lelon Huh, MD while in the presence of Lelon Huh, MD.  Established patient visit   Patient: Scott [name]   DOB: 1971-03-19   49 y.o. Male  MRN: [id number] Visit Date: 11/30/2019  Today's healthcare provider: Lelon Huh, MD   Chief Complaint  Patient presents with  . Abdominal Pain  . Constipation   Subjective    Abdominal Pain This is a new problem. Episode onset: 2-3 weeks ago. Pain location: generalized upper abdomen. The pain is moderate. The quality of the pain is sharp. Associated symptoms include constipation. Pertinent negatives include no fever, nausea or vomiting. Nothing aggravates the pain. The pain is relieved by nothing. Treatments tried: OTC laxative. The treatment provided mild relief.    Has had similar pains off and on the last few years. Has history of constipation and diarrhea since adolescence usually managed with high fiber diet. Last normal BM was a few weeks ago. Not tried Miralax. Has persistent pressure and fullness sensation in abdomen, pain left mid and upper abdomen.       Medications: Outpatient Medications Prior to Visit  Medication Sig  . ANDROGEL PUMP 20.25 MG/ACT (1.62%) GEL Apply 1 application topically daily.   . Ascorbic Acid (VITAMIN C) 1000 MG tablet Take 1,000 mg by mouth 2 (two) times a day.  . b complex vitamins capsule Take 1 capsule by mouth daily.  . brinzolamide (AZOPT) 1 % ophthalmic suspension Place 1 drop into both eyes daily.   Marland Kitchen glucosamine-chondroitin 500-400 MG tablet Take 2 tablets by mouth daily.  . Lactobacillus (PROBIOTIC ACIDOPHILUS PO) Take by mouth daily.   Marland Kitchen latanoprost (XALATAN) 0.005 % ophthalmic solution Place 1 drop into both eyes at bedtime.   . Magnesium 500 MG TABS Take 1 tablet by mouth daily. Patient says he takes magnesium orotate  .  metolazone (ZAROXOLYN) 2.5 MG tablet Take 1 tablet (2.5 mg total) by mouth 3 (three) times a week.  . Omega-3 Fatty Acids (OMEGA ESSENTIALS BASIC) LIQD Take 15 mLs by mouth daily.  Marland Kitchen PARoxetine (PAXIL) 40 MG tablet Take 1 tablet (40 mg total) by mouth daily.  . potassium chloride SA (KLOR-CON) 20 MEQ tablet TAKE 2 TABLETS(40 MEQ) BY MOUTH TWICE DAILY  . Spacer/Aero-Holding Chambers DEVI 1 Device by Does not apply route 4 (four) times daily.  Marland Kitchen spironolactone (ALDACTONE) 50 MG tablet Take 50 mg by mouth daily.  Marland Kitchen torsemide (DEMADEX) 20 MG tablet Take 3 tablets (60 mg total) by mouth daily.  Marland Kitchen albuterol (PROVENTIL HFA;VENTOLIN HFA) 108 (90 Base) MCG/ACT inhaler Inhale 2 puffs into the lungs every 4 (four) hours as needed for wheezing or shortness of breath. (Patient not taking: Reported on 11/30/2019)  . quinapril (ACCUPRIL) 20 MG tablet Take 10mg  (half tablet) at bedtime if your systolic blood pressure is more than 120. If your systolic blood pressure is less than 120, hold this medication. (Patient not taking: Reported on 11/30/2019)   No facility-administered medications prior to visit.    Review of Systems  Constitutional: Negative for appetite change, chills and fever.  Respiratory: Negative for chest tightness, shortness of breath and wheezing.   Cardiovascular: Negative for chest pain and palpitations.  Gastrointestinal: Positive for abdominal distention, abdominal pain and constipation. Negative for nausea and vomiting.       Dark colored stools      Objective  BP 119/78 (BP Location: Left Arm, Patient Position: Sitting, Cuff Size: Large)   Pulse (!) 118   Temp 98.2 F (36.8 C) (Oral)   Resp (!) 24   Wt 215 lb (97.5 kg)   SpO2 95% Comment: room air  BMI 39.32 kg/m    Physical Exam  General Appearance:    Obese male, alert, cooperative, in no acute distress  Eyes:    PERRL, conjunctiva/corneas clear, EOM's intact       Lungs:     Clear to auscultation bilaterally,  respirations unlabored  Heart:    Tachycardic. Normal rhythm. No murmurs, rubs, or gallops.   Abdomen:   bowel sounds present and normal in all 4 quadrants. No CVA tenderness, umbilical hernia, diffusely distended abdomen. No focal masses. Mild left sided tenderness.       Assessment & Plan     1. Generalized abdominal pain  - DG Abd 2 Views; Future  2. Constipation, unspecified constipation type Start OTC Miralax daily until having a normal bowel movement.  Consider additional imaging or GI referral after reviewing abdominal xray.    No follow-ups on file.      The entirety of the information documented in the History of Present Illness, Review of Systems and Physical Exam were personally obtained by me. Portions of this information were initially documented by the CMA and reviewed by me for thoroughness and accuracy.      Lelon Huh, MD  [facility] [phone] (phone) [phone] (fax)  [place]

## 2019-11-30 NOTE — Patient Instructions (Signed)
•   Start taking OTC Miralax according to directions on the label until you have a normal bowel movement.    Go to the [facility] on [facility] for abdominal Xray

## 2019-12-03 ENCOUNTER — Telehealth: Payer: Self-pay

## 2019-12-03 DIAGNOSIS — K5909 Other constipation: Secondary | ICD-10-CM

## 2019-12-03 NOTE — Telephone Encounter (Signed)
-----   Message from Birdie Sons, MD sent at 12/03/2019  7:44 AM EDT ----- Xray of abdomen shows excessive stool, but no  obstruction. He needs referral to GI for chronic constipation

## 2019-12-03 NOTE — Telephone Encounter (Signed)
Patient was advised and agrees with placing a referral. GI referral placed and patient is aware someone will contact him for appointment.

## 2019-12-05 ENCOUNTER — Telehealth: Payer: Self-pay | Admitting: Cardiovascular Disease

## 2019-12-05 DIAGNOSIS — Z79899 Other long term (current) drug therapy: Secondary | ICD-10-CM

## 2019-12-05 DIAGNOSIS — J984 Other disorders of lung: Secondary | ICD-10-CM

## 2019-12-05 DIAGNOSIS — I5032 Chronic diastolic (congestive) heart failure: Secondary | ICD-10-CM

## 2019-12-05 NOTE — Telephone Encounter (Signed)
Pt c/o swelling: STAT is pt has developed SOB within 24 hours  1) How much weight have you gained and in what time span? 2-3 pounds in the last day   2) If swelling, where is the swelling located? Feels abdominal fullness and tightness in chest   3) Are you currently taking a fluid pill? yes  4) Are you currently SOB? yes  5) Do you have a log of your daily weights (if so, list)? No   6) Have you gained 3 pounds in a day or 5 pounds in a week? yes  7) Have you traveled recently? No

## 2019-12-05 NOTE — Telephone Encounter (Signed)
Call transferred directly to nurse. The patient calls in today with reports of: - increased SOB - increased chest tightness - increased abdominal tightness - weight gain of 3 lbs overnight  The patient's chart states he takes torsemide 20 mg- 3 tablets (60 mg) once daily, but the patient states his baseline dose of torsemide is 20 mg- 3 tablets (60 mg) BID.  However, for the last few days, he has been taking torsemide 20 mg 4 tablets (80 mg) twice daily. His chart also states he takes metolazone 2.5 mg three times a week. The patient last took this yesterday. He confirms he does not have consistent days of the week that he uses this because "my life is crazy."  The patient states he needed to hang up as he was taking his fiance to a doctor's appointment. He will need to call back.  I did confirm with him no change in his fluid or salt intake over the last few days. He is aware I will review the above with once of our providers. He states he will call back when he is available.

## 2019-12-05 NOTE — Telephone Encounter (Signed)
I spoke with the patient. I have advised him of Christell Faith, PA's recommendations as listed below. Per the patient, he would like to try Option B.  He cannot remember when he increased his torsemide dose to 80 mg BID. I did confirm with him that he did this yesterday and today. I have advised him we will count tomorrow as day # 3. He is advised on 12/05/19 to take: 1) metolazone 2.5 mg x 1 dose 30 minutes prior to his first dose of torsemide. 2) he will then take torsemide 80 mg BID 3) he will also take an additional 40 meq of potassium  I have asked him to call back early on Friday morning if his symptoms are worse or unimproved based on today's recommendations.  Otherwise, he is advised to have a repeat BMP on 9/7.  I feel like he will need to be contacted regardless to remind him of this on Friday.   Will hold this call in triage until Friday.    He was also advised to follow up with Pulmonary. I have placed a referral back to [place].

## 2019-12-05 NOTE — Telephone Encounter (Signed)
Patient's reported symptoms are notable for fluid overload with a weight gain of 2-3 pounds overnight, chest and abdominal tightness. When he was last seen, it was noted he had self titrated his torsemide back to 60 mg bid, instead of prior recommended 60 mg daily, as well as metolazone 2.5 mg back to 3 days/week instead of prior recommended 2 days/week. Over the past few days, he has been taking torsemide 80 mg bid and continue 3 day/week metolazone, last taking this on 8/31. He also has severe restrictive lung disease and was re-referred to pulmonology to reestablish care.   Recommendations: -Couple of options: a) Recommend he go to same day surgery on 9/2 for a stat BMET and IV Lasix 80 mg with KCl 40 mEq x 1  b) Alternatively, if he cannot do the above, since he has already increased torsemide to 80 mg bid, he can continue that for a total of 3 days dating back to when he self titrated and take an additional metolazone 2.5 mg x 1 with an extra KCl 40 mEq. If he chooses the latter option, he will need a follow up BMET 12/11/2019  -Ensure with his hectic schedule currently, that he is not eating foods higher in sodium and that he is drinking less than 2 L of all fluids daily  -Reestablish with pulmonology for severe restrictive lung disease

## 2019-12-07 NOTE — Telephone Encounter (Signed)
Noted. Patient is feeling better. Continue with previously recommended follow up plan.

## 2019-12-07 NOTE — Telephone Encounter (Signed)
Called and left a detailed VM per DPR on file of Ryan Dunn's recommendation as noted below. Encouraged the patient to call back with any questions or concerns.

## 2019-12-07 NOTE — Telephone Encounter (Signed)
As long as his symptoms are improving, and in an effort to minimize risk of AKI, continue with our current plan and reduce torsemide back down to 60 mg bid and prior metolazone/KCl dosing.   Medications: Torsemide 60 mg bid Metolazone 2.5 mg 3 days per week KCl 40 mEq bid with an extra 40 mEq when he takes metolazone.   Follow up BMET 9/7.

## 2019-12-07 NOTE — Telephone Encounter (Signed)
I spoke with the patient just now to follow up on how his breathing is doing. He states he feels this was some improved yesterday. He is currently stable this morning, but has not been up for very long. He has not weighed or taken any meds of this morning.   I did confirm with him that he took torsemide 80 mg BID yesterday (which we were counting as his day #3). He did say his weight was still up yesterday morning, but stable.   I have advised the patient I want to update Ryan, [place] as to how he is doing today and see how he wants to dose his torsemide over the weekend.  He is aware that he will need a BMP early next week, but I will touch base with him this afternoon after receiving further recommendations from [facility].  The patient voices understanding and is agreeable.

## 2019-12-07 NOTE — Telephone Encounter (Signed)
Patient returned my call and we reviewed Scott [name]'s recommendations as noted below. Patient also stated that his weight is now down 2 lbs from yesterday and he is also breathing a little easier.  Patient verbalized understanding a nd agreed with POC.

## 2019-12-11 ENCOUNTER — Telehealth: Payer: Self-pay | Admitting: Cardiovascular Disease

## 2019-12-11 MED ORDER — TORSEMIDE 20 MG PO TABS: 60.0000 mg | ORAL_TABLET | Freq: Two times a day (BID) | ORAL | 2 refills | Status: DC

## 2019-12-11 NOTE — Progress Notes (Signed)
Office Visit    Patient Name: Scott [name] Date of Encounter: 12/12/2019  Primary Care Provider:  Margo Common, [place] Primary Cardiologist:  Ida Rogue, MD Electrophysiologist:  None   Chief Complaint    Scott [name] is a 49 y.o. male with a hx of diastolic heart failure, Klippel-Feil syndrome, restrictive lung disease, HTN, former tobacco use, HLD, asthma, depression, anxiety presents today for follow up of diastolic heart failure.    Past Medical History    Past Medical History:  Diagnosis Date  . Acid reflux   . Anxiety   . Arthritis   . Asthma   . CHF (congestive heart failure) ([place])   . Depression   . Glaucoma   . Hyperlipidemia   . Hypertension   . Klippel-Feil syndrome    Past Surgical History:  Procedure Laterality Date  . COLOSTOMY    . DENVER SHUNT PLACEMENT      Allergies  Allergies  Allergen Reactions  . Codeine Shortness Of Breath    History of Present Illness    Scott [name] is a 49 y.o. male with a hx of diastolic heart failure, Klippel-Feil syndrome, restrictive lung disease, HTN, former tobacco use, HLD, asthma, depression, anxiety.  He was last seen 11/23/19 by Christell Faith, PA.  Echocardiogram 02/04/2018 LVEF 55 to 60%.  Echocardiogram was performed during hospitalizations with acute on chronic diastolic heart failure with hypoxia requiring IV Lasix.  Stress testing 02/09/2018 with no evidence of ischemia.  Sleep study 03/10/2018 negative for OSA.  Pulmonary function testing notable for severe restrictive lung disease.  At clinic visit in February he was recommended to add metolazone 2.5 mg twice a week for weight over 209 pounds.  He was referred to pulmonology.  Seen in clinic 10/09/19 noting hypotension, abdominal bloating, and atypical chest pain. Of note, he had not been taking his diuretic regularly due to visiting and caring for his mother in the hospital. His chest pain was thought to be due to stress and not suspicion for angina. His  hyoptension was presumed due to taking extra diuretic at same time. He was recommended to resume Torsemide 60mg  daily, Metolazone 2.5mg  three times per week, and reduce his Quinapil to 10mg  with further reduction if BP remained low. He missed his follow up 10/2019 due to his mother's passing.   He was seen in follow up 11/23/19. He noted bilateral anterior chest burning sensation. His reported dry weight is 208lbs and he reported weight at home of 210 pounds. He had discontinued his Quinapril and increased his Torsemide to 60mg  BID. Much of his dyspnea was thought to be due to restrictive lung disease. His BNP and BMP were stable.   He called the office 12/05/19 noting 2-3 lb weight gain associated with abdominal fullness and chest tightness. He had been taking Torsemide 60mg  BID with recent increase to 80mg  BID in addition to Metolazone three times per week. Noted that his Metolazone dates were inconsistent. He was recommended to finish 3 day course of Torsemide 80mg  BID and repeat BMP 12/11/19. He was then recommended to reduce Torsemide to 60mg  BID for protection of renal function.   His weight is up 2 pounds from his clinic visit 2.5 weeks ago. Reports his breathing has worsened when he is laying down and with exertion. Tells me this has all progressed sine he was last seen. He worries "something else is going up" other than the fluid retention. Reports no coughing. Notes some upper airway  wheeze. Tells me has not taken taken his Torsemide in 2 days as he ran out. He did take one Metolazone yesterday and one Spironolactone yesterday.   Tells me his heart rate has been routinely 106-115 bpm.  No sensation of palpitations.  EKGs/Labs/Other Studies Reviewed:   The following studies were reviewed today:  EKG:  EKG is ordered today.  The ekg ordered today demonstrates sinus tachycardia 114 bpm.   Recent Labs: 11/23/2019: ALT 28 12/12/2019: B Natriuretic Peptide 28.9; BUN 25; Creatinine, Ser 1.27; Hemoglobin  14.4; Platelets 330; Potassium 3.7; Sodium 135  Recent Lipid Panel    Component Value Date/Time   CHOL 155 02/07/2018 0508   TRIG 74 02/07/2018 0508   HDL 55 02/07/2018 0508   CHOLHDL 2.8 02/07/2018 0508   VLDL 15 02/07/2018 0508   LDLCALC 85 02/07/2018 0508    Home Medications   Current Meds  Medication Sig  . albuterol (VENTOLIN HFA) 108 (90 Base) MCG/ACT inhaler Inhale 2 puffs into the lungs every 4 (four) hours as needed for wheezing or shortness of breath.  . ANDROGEL PUMP 20.25 MG/ACT (1.62%) GEL Apply 1 application topically daily.   . Ascorbic Acid (VITAMIN C) 1000 MG tablet Take 1,000 mg by mouth 2 (two) times a day.  . b complex vitamins capsule Take 1 capsule by mouth daily.  . brinzolamide (AZOPT) 1 % ophthalmic suspension Place 1 drop into both eyes daily.   Marland Kitchen glucosamine-chondroitin 500-400 MG tablet Take 2 tablets by mouth daily.  . Lactobacillus (PROBIOTIC ACIDOPHILUS PO) Take by mouth daily.   Marland Kitchen latanoprost (XALATAN) 0.005 % ophthalmic solution Place 1 drop into both eyes at bedtime.   . Magnesium 500 MG TABS Take 1 tablet by mouth daily. Patient says he takes magnesium orotate  . metolazone (ZAROXOLYN) 2.5 MG tablet Take 1 tablet (2.5 mg total) by mouth 3 (three) times a week.  . Omega-3 Fatty Acids (OMEGA ESSENTIALS BASIC) LIQD Take 15 mLs by mouth daily.  Marland Kitchen PARoxetine (PAXIL) 40 MG tablet Take 1 tablet (40 mg total) by mouth daily.  . potassium chloride SA (KLOR-CON) 20 MEQ tablet TAKE 2 TABLETS(40 MEQ) BY MOUTH TWICE DAILY  . Spacer/Aero-Holding Chambers DEVI 1 Device by Does not apply route 4 (four) times daily.  Marland Kitchen spironolactone (ALDACTONE) 50 MG tablet Take 50 mg by mouth daily.  Marland Kitchen torsemide (DEMADEX) 20 MG tablet Take 3 tablets (60 mg total) by mouth 2 (two) times daily.  . [DISCONTINUED] albuterol (PROVENTIL HFA;VENTOLIN HFA) 108 (90 Base) MCG/ACT inhaler Inhale 2 puffs into the lungs every 4 (four) hours as needed for wheezing or shortness of breath.  .  [DISCONTINUED] quinapril (ACCUPRIL) 20 MG tablet Take 10mg  (half tablet) at bedtime if your systolic blood pressure is more than 120. If your systolic blood pressure is less than 120, hold this medication.    Review of Systems   Review of Systems  Constitutional: Negative for chills, fever and malaise/fatigue.  Cardiovascular: Positive for dyspnea on exertion and orthopnea. Negative for chest pain, leg swelling, near-syncope, palpitations and syncope.  Respiratory: Positive for shortness of breath. Negative for cough and wheezing.   Gastrointestinal: Negative for nausea and vomiting.  Neurological: Negative for dizziness, light-headedness and weakness.   All other systems reviewed and are otherwise negative except as noted above.  Physical Exam    VS:  BP 120/80 (BP Location: Left Arm, Patient Position: Sitting, Cuff Size: Normal)   Pulse (!) 114   Ht 5\' 2"  (1.575 m)   Wt  216 lb 3.2 oz (98.1 kg)   SpO2 93%   BMI 39.54 kg/m  , BMI Body mass index is 39.54 kg/m. GEN: Well nourished, well developed, in no acute distress. HEENT: normal. Neck: Supple, no JVD, carotid bruits, or masses. Cardiac: RRR, tachycardic, no murmurs, rubs, or gallops. No clubbing, cyanosis, edema.  Radials/DP/PT 2+ and equal bilaterally.  Respiratory:  Respirations regular and unlabored, clear to auscultation bilaterally.  Expiratory wheeze noted on bilateral lower lobes. GI: Soft, nontender, nondistended MS: No deformity or atrophy. Skin: Warm and dry, no rash. Neuro:  Strength and sensation are intact. Psych: Normal affect.  Assessment & Plan    1. Chronic diastolic heart failure -dry weight approximately 2 8 pounds.  He is up 2 pounds since last clinic visit.  He has missed 2 days of his torsemide which is likely contributory his dyspnea.  Anticipate much of his dyspnea is due to restrictive lung disease and kyphosis and again encouraged appoint with pulmonology.  His sinus tachycardia is likely  multifactorial including obesity, physical deconditioning, restrictive lung disease.  Will defer addition of beta-blocker in the setting of restrictive lung disease.  BMP, BNP, CBC today.   Torsemide 80 mg this evening and 80 mg twice daily for next 2 days then return to torsemide 60 mg twice daily.  Continue metolazone 2.5 mg 3 times per week.  Continue spironolactone 50 mg daily.  Continue potassium supplementation.  2. HTN - BP well controlled. Continue current antihypertensive regimen.  He has not required his quinapril in some months for control of his blood pressure and as such we will discontinue and remove from his medication list.  3. Restrictive lung disease - Previously referred to pulmonology.  He was encouraged to schedule pulmonology appointment today as his restrictive lung disease and kyphosis are likely driving his dyspnea.  Chest x-ray today due to expiratory wheeze and upper airway wheeze.  As needed albuterol inhaler prescription sent.  4. Obesity - Weight loss via diet and exercise encouraged.  We discussed possible referral of cardiopulmonary rehab in the future when his dyspnea is better controlled.  Disposition: Follow up in October with Christell Faith, PA as previously scheduled  Scott Dubonnet, NP 12/12/2019, 5:07 PM

## 2019-12-11 NOTE — Telephone Encounter (Signed)
Spoke to patient. He needs Rx updated to two times a day so that he can get a refill with more tablets or he will be out of Torsemide. Rx sent to pharmacy. He is also complaining of not being about to lay down due to breathing to sleep. He was around 211 pounds this morning and is usually 208-210 per patient. Patient is scheduled to see Laurann Montana, NP tomorrow at 1:30 pm. Advised him to keep appointment and bring any weight readings and vital sign readings to appointment with him.

## 2019-12-11 NOTE — Telephone Encounter (Signed)
*  STAT* If patient is at the pharmacy, call can be transferred to refill team.   1. Which medications need to be refilled? (please list name of each medication and dose if known) torsemide 20 mg 3 pills bid  2. Which pharmacy/location (including street and city if local pharmacy) is medication to be sent to? [facility] in [place]   3. Do they need a 30 day or 90 day supply? 90  Before last appointment, patient was taking more than prescribed and is now running short on medication. Patient was told by insurance they will not cover him until October. Patient states he is completely of of medication   Patient may need to be advised on proper dosing of medication because his intake does not match the prescription   Please advise

## 2019-12-12 ENCOUNTER — Other Ambulatory Visit: Payer: Self-pay

## 2019-12-12 ENCOUNTER — Other Ambulatory Visit
Admission: RE | Admit: 2019-12-12 | Discharge: 2019-12-12 | Disposition: A | Discharge status: Home / Self Care | Payer: Medicare Other | Source: Ambulatory Visit | Attending: Family | Admitting: Family

## 2019-12-12 ENCOUNTER — Encounter: Payer: Self-pay | Admitting: Family

## 2019-12-12 ENCOUNTER — Ambulatory Visit (INDEPENDENT_AMBULATORY_CARE_PROVIDER_SITE_OTHER): Payer: Medicare Other | Admitting: Family

## 2019-12-12 ENCOUNTER — Ambulatory Visit
Admission: RE | Admit: 2019-12-12 | Discharge: 2019-12-12 | Disposition: A | Discharge status: Home / Self Care | Payer: Medicare Other | Source: Ambulatory Visit | Attending: Family | Admitting: Family

## 2019-12-12 VITALS — BP 120/80 | HR 114 | Ht 62.0 in | Wt 216.2 lb

## 2019-12-12 DIAGNOSIS — J811 Chronic pulmonary edema: Secondary | ICD-10-CM | POA: Diagnosis not present

## 2019-12-12 DIAGNOSIS — R0602 Shortness of breath: Secondary | ICD-10-CM | POA: Diagnosis not present

## 2019-12-12 DIAGNOSIS — J984 Other disorders of lung: Secondary | ICD-10-CM

## 2019-12-12 DIAGNOSIS — Z79899 Other long term (current) drug therapy: Secondary | ICD-10-CM

## 2019-12-12 DIAGNOSIS — I5032 Chronic diastolic (congestive) heart failure: Secondary | ICD-10-CM

## 2019-12-12 DIAGNOSIS — I509 Heart failure, unspecified: Secondary | ICD-10-CM | POA: Diagnosis not present

## 2019-12-12 DIAGNOSIS — R0600 Dyspnea, unspecified: Secondary | ICD-10-CM | POA: Diagnosis not present

## 2019-12-12 LAB — CBC WITH DIFFERENTIAL/PLATELET
Abs Immature Granulocytes: 0.04 10*3/uL (ref 0.00–0.07)
Basophils Absolute: 0 10*3/uL (ref 0.0–0.1)
Basophils Relative: 0 %
Eosinophils Absolute: 0.1 10*3/uL (ref 0.0–0.5)
Eosinophils Relative: 1 %
HCT: 42.5 % (ref 39.0–52.0)
Hemoglobin: 14.4 g/dL (ref 13.0–17.0)
Immature Granulocytes: 0 %
Lymphocytes Relative: 19 %
Lymphs Abs: 1.9 10*3/uL (ref 0.7–4.0)
MCH: 31.4 pg (ref 26.0–34.0)
MCHC: 33.9 g/dL (ref 30.0–36.0)
MCV: 92.8 fL (ref 80.0–100.0)
Monocytes Absolute: 1.3 10*3/uL — ABNORMAL HIGH (ref 0.1–1.0)
Monocytes Relative: 13 %
Neutro Abs: 6.8 10*3/uL (ref 1.7–7.7)
Neutrophils Relative %: 67 %
Platelets: 330 10*3/uL (ref 150–400)
RBC: 4.58 MIL/uL (ref 4.22–5.81)
RDW: 14.1 % (ref 11.5–15.5)
WBC: 10.1 10*3/uL (ref 4.0–10.5)
nRBC: 0 % (ref 0.0–0.2)

## 2019-12-12 LAB — BASIC METABOLIC PANEL
Anion gap: 12 (ref 5–15)
BUN: 25 mg/dL — ABNORMAL HIGH (ref 6–20)
CO2: 31 mmol/L (ref 22–32)
Calcium: 10.5 mg/dL — ABNORMAL HIGH (ref 8.9–10.3)
Chloride: 92 mmol/L — ABNORMAL LOW (ref 98–111)
Creatinine, Ser: 1.27 mg/dL — ABNORMAL HIGH (ref 0.61–1.24)
GFR calc Af Amer: >60 mL/min (ref >60)
GFR calc non Af Amer: >60 mL/min (ref >60)
Glucose, Bld: 201 mg/dL — ABNORMAL HIGH (ref 70–99)
Potassium: 3.7 mmol/L (ref 3.5–5.1)
Sodium: 135 mmol/L (ref 135–145)

## 2019-12-12 LAB — BRAIN NATRIURETIC PEPTIDE: B Natriuretic Peptide: 28.9 pg/mL (ref 0.0–100.0)

## 2019-12-12 MED ORDER — ALBUTEROL SULFATE HFA 108 (90 BASE) MCG/ACT IN AERS: 2.0000 | INHALATION_SPRAY | RESPIRATORY_TRACT | 1 refills | Status: DC | PRN

## 2019-12-12 NOTE — Patient Instructions (Addendum)
Medication Instructions:  Your physician has recommended you make the following change in your medication:   CHANGE Torsemide to 80mg  twice daily  Take a dose when you get home tonight.  Take 80mg  twice daily for Thursday and Friday.   On Saturday, RETURN to Torsemide to 60mg  twice daily.  *If you need a refill on your cardiac medications before your next appointment, please call your pharmacy*  Lab Work: Your physician recommends that you return for lab work today: BMP, BNP, CBC  If you have labs (blood work) drawn today and your tests are completely normal, you will receive your results only by: Marland Kitchen MyChart Message (if you have MyChart) OR . A paper copy in the mail If you have any lab test that is abnormal or we need to change your treatment, we will call you to review the results.  Testing/Procedures: A chest x-ray takes a picture of the organs and structures inside the chest, including the heart, lungs, and blood vessels. This test can show several things, including, whether the heart is enlarges; whether fluid is building up in the lungs; and whether pacemaker / defibrillator leads are still in place.  Follow-Up: At [facility], you and your health needs are our priority.  As part of our continuing mission to provide you with exceptional heart care, we have created designated Provider Care Teams.  These Care Teams include your primary Cardiologist (physician) and Advanced Practice Providers (APPs -  Physician Assistants and Nurse Practitioners) who all work together to provide you with the care you need, when you need it.  We recommend signing up for the patient portal called "MyChart".  Sign up information is provided on this After Visit Summary.  MyChart is used to connect with patients for Virtual Visits (Telemedicine).  Patients are able to view lab/test results, encounter notes, upcoming appointments, etc.  Non-urgent messages can be sent to your provider as well.   To learn  more about what you can do with MyChart, go to [email or web address].    Your next appointment:   As previously scheduled  Other Instructions  Schedule your appointment with pulmonology when you check out today.

## 2019-12-21 ENCOUNTER — Telehealth: Payer: Self-pay | Admitting: Cardiovascular Disease

## 2019-12-21 NOTE — Telephone Encounter (Addendum)
Spoke with the patient. Patient reports a 3lb weight gain overnight. He denies a increase sodium intake. He is taking torsemide 60 mg bid and metolazone 2.5mg  every other day. He did take metolazone yesterday. Adv the patient that I will talk with Christell Faith. PA and call back with his recommendation.  Discussed with Ryan. Patient should continue torsemide 60 mg bid as prescribed. He should take his metolazone tomorrow as scheduled. He should limit his sodium intake. Adv the patient to contact the office on Monday if his weight continues to increase. Follow up with pulmonology for sob. Patient verbalized understanding.

## 2019-12-21 NOTE — Telephone Encounter (Signed)
Pt c/o swelling: STAT is pt has developed SOB within 24 hours  1) How much weight have you gained and in what time span? 3 lbs overnight  2) If swelling, where is the swelling located? Mid section  3) Are you currently taking a fluid pill? yes  4) Are you currently SOB? Yes along with chest tightness all day  5) Do you have a log of your daily weights (if so, list)?   6) Have you gained 3 pounds in a day or 5 pounds in a week? yes  7) Have you traveled recently? no

## 2019-12-25 NOTE — Telephone Encounter (Signed)
Call to patient to discuss pt message sent to clinic.  Pt audibly SOB over the phone. Speaking in fragmented sentences and having to pause to catch breath. Some audible wheezing noted.   Pt reported weight increase. Yesterday was 216 lbs.   I urged pt to seek urgent medical care for SOB. Pt verbalized understanding and said he would report to [facility] ED.

## 2019-12-28 ENCOUNTER — Ambulatory Visit: Payer: Self-pay

## 2019-12-28 NOTE — Telephone Encounter (Signed)
Patient called with extreme SOB. Speaking is short phrases.  He states he has had symptoms several days but they have gotten worse since yesterday. He has Hx of CHF He denies fever and cough. He says that he has pressure in his chest. He denies swelling in his legs,feet, hands. He refuses for me to call 911. He states he will drive himself. He was advised not to do drive himself. He wanted chance to call a friend. NT will call back.  I called patient after giving him 5 minutes to find transportation. I spoke to someone stating she was his fiancee and she was driving him to the hospital for care. I again explained to her that his breathing was an emergency. She verbalized understanding and patient and she refused EMS (911) call.  Reason for Disposition . SEVERE difficulty breathing (e.g., struggling for each breath, speaks in single words)  Answer Assessment - Initial Assessment Questions 1. RESPIRATORY STATUS: "Describe your breathing?" (e.g., wheezing, shortness of breath, unable to speak, severe coughing)      Tight wheezing 2. ONSET: "When did this breathing problem begin?"      Since yesterday worse 3. PATTERN "Does the difficult breathing come and go, or has it been constant since it started?"     constant 4. SEVERITY: "How bad is your breathing?" (e.g., mild, moderate, severe)    - MILD: No SOB at rest, mild SOB with walking, speaks normally in sentences, can lay down, no retractions, pulse < 100.    - MODERATE: SOB at rest, SOB with minimal exertion and prefers to sit, cannot lie down flat, speaks in phrases, mild retractions, audible wheezing, pulse 100-120.    - SEVERE: Very SOB at rest, speaks in single words, struggling to breathe, sitting hunched forward, retractions, pulse > 120     severe 5. RECURRENT SYMPTOM: "Have you had difficulty breathing before?" If Yes, ask: "When was the last time?" and "What happened that time?"      yes 6. CARDIAC HISTORY: "Do you have any history of heart  disease?" (e.g., heart attack, angina, bypass surgery, angioplasty)      CHF 7. LUNG HISTORY: "Do you have any history of lung disease?"  (e.g., pulmonary embolus, asthma, emphysema)     No 8. CAUSE: "What do you think is causing the breathing problem?"     CHF unsure 9. OTHER SYMPTOMS: "Do you have any other symptoms? (e.g., dizziness, runny nose, cough, chest pain, fever)    Chest tightness 10. PREGNANCY: "Is there any chance you are pregnant?" "When was your last menstrual period?"     N/A 11. TRAVEL: "Have you traveled out of the country in the last month?" (e.g., travel history, exposures)       N/A  Protocols used: BREATHING DIFFICULTY-A-AH

## 2019-12-29 ENCOUNTER — Other Ambulatory Visit: Payer: Self-pay

## 2019-12-29 ENCOUNTER — Emergency Department: Payer: Medicare Other

## 2019-12-29 ENCOUNTER — Encounter: Payer: Self-pay | Admitting: Emergency Medicine

## 2019-12-29 ENCOUNTER — Inpatient Hospital Stay
Admission: EM | Admit: 2019-12-29 | Discharge: 2020-01-06 | DRG: 190 | Disposition: A | Discharge status: Home Health | Payer: Medicare Other | Attending: Internal Medicine | Admitting: Internal Medicine

## 2019-12-29 DIAGNOSIS — Z885 Allergy status to narcotic agent status: Secondary | ICD-10-CM

## 2019-12-29 DIAGNOSIS — R140 Abdominal distension (gaseous): Secondary | ICD-10-CM

## 2019-12-29 DIAGNOSIS — N179 Acute kidney failure, unspecified: Secondary | ICD-10-CM | POA: Diagnosis present

## 2019-12-29 DIAGNOSIS — J9621 Acute and chronic respiratory failure with hypoxia: Secondary | ICD-10-CM | POA: Diagnosis present

## 2019-12-29 DIAGNOSIS — F32A Depression, unspecified: Secondary | ICD-10-CM | POA: Diagnosis present

## 2019-12-29 DIAGNOSIS — R600 Localized edema: Secondary | ICD-10-CM

## 2019-12-29 DIAGNOSIS — E785 Hyperlipidemia, unspecified: Secondary | ICD-10-CM | POA: Diagnosis present

## 2019-12-29 DIAGNOSIS — H409 Unspecified glaucoma: Secondary | ICD-10-CM | POA: Diagnosis present

## 2019-12-29 DIAGNOSIS — N182 Chronic kidney disease, stage 2 (mild): Secondary | ICD-10-CM | POA: Diagnosis present

## 2019-12-29 DIAGNOSIS — J9811 Atelectasis: Secondary | ICD-10-CM | POA: Diagnosis not present

## 2019-12-29 DIAGNOSIS — J189 Pneumonia, unspecified organism: Secondary | ICD-10-CM | POA: Diagnosis present

## 2019-12-29 DIAGNOSIS — T380X5A Adverse effect of glucocorticoids and synthetic analogues, initial encounter: Secondary | ICD-10-CM | POA: Diagnosis not present

## 2019-12-29 DIAGNOSIS — M1990 Unspecified osteoarthritis, unspecified site: Secondary | ICD-10-CM | POA: Diagnosis present

## 2019-12-29 DIAGNOSIS — I82409 Acute embolism and thrombosis of unspecified deep veins of unspecified lower extremity: Secondary | ICD-10-CM

## 2019-12-29 DIAGNOSIS — J441 Chronic obstructive pulmonary disease with (acute) exacerbation: Principal | ICD-10-CM | POA: Diagnosis present

## 2019-12-29 DIAGNOSIS — J40 Bronchitis, not specified as acute or chronic: Secondary | ICD-10-CM

## 2019-12-29 DIAGNOSIS — Q761 Klippel-Feil syndrome: Secondary | ICD-10-CM

## 2019-12-29 DIAGNOSIS — M419 Scoliosis, unspecified: Secondary | ICD-10-CM | POA: Diagnosis present

## 2019-12-29 DIAGNOSIS — I5033 Acute on chronic diastolic (congestive) heart failure: Secondary | ICD-10-CM | POA: Diagnosis present

## 2019-12-29 DIAGNOSIS — F419 Anxiety disorder, unspecified: Secondary | ICD-10-CM | POA: Diagnosis present

## 2019-12-29 DIAGNOSIS — Z20822 Contact with and (suspected) exposure to covid-19: Secondary | ICD-10-CM | POA: Diagnosis not present

## 2019-12-29 DIAGNOSIS — I5032 Chronic diastolic (congestive) heart failure: Secondary | ICD-10-CM | POA: Diagnosis present

## 2019-12-29 DIAGNOSIS — Z933 Colostomy status: Secondary | ICD-10-CM

## 2019-12-29 DIAGNOSIS — R0609 Other forms of dyspnea: Secondary | ICD-10-CM

## 2019-12-29 DIAGNOSIS — R918 Other nonspecific abnormal finding of lung field: Secondary | ICD-10-CM | POA: Diagnosis not present

## 2019-12-29 DIAGNOSIS — D72829 Elevated white blood cell count, unspecified: Secondary | ICD-10-CM | POA: Diagnosis not present

## 2019-12-29 DIAGNOSIS — J440 Chronic obstructive pulmonary disease with acute lower respiratory infection: Secondary | ICD-10-CM | POA: Diagnosis present

## 2019-12-29 DIAGNOSIS — Z79899 Other long term (current) drug therapy: Secondary | ICD-10-CM

## 2019-12-29 DIAGNOSIS — Z6841 Body Mass Index (BMI) 40.0 and over, adult: Secondary | ICD-10-CM

## 2019-12-29 DIAGNOSIS — R000 Tachycardia, unspecified: Secondary | ICD-10-CM | POA: Diagnosis not present

## 2019-12-29 DIAGNOSIS — I130 Hypertensive heart and chronic kidney disease with heart failure and stage 1 through stage 4 chronic kidney disease, or unspecified chronic kidney disease: Secondary | ICD-10-CM | POA: Diagnosis not present

## 2019-12-29 DIAGNOSIS — J984 Other disorders of lung: Secondary | ICD-10-CM | POA: Diagnosis present

## 2019-12-29 DIAGNOSIS — Z87891 Personal history of nicotine dependence: Secondary | ICD-10-CM

## 2019-12-29 DIAGNOSIS — Z8249 Family history of ischemic heart disease and other diseases of the circulatory system: Secondary | ICD-10-CM

## 2019-12-29 DIAGNOSIS — I10 Essential (primary) hypertension: Secondary | ICD-10-CM | POA: Diagnosis present

## 2019-12-29 DIAGNOSIS — F329 Major depressive disorder, single episode, unspecified: Secondary | ICD-10-CM | POA: Diagnosis present

## 2019-12-29 DIAGNOSIS — R739 Hyperglycemia, unspecified: Secondary | ICD-10-CM | POA: Diagnosis present

## 2019-12-29 DIAGNOSIS — K5900 Constipation, unspecified: Secondary | ICD-10-CM | POA: Diagnosis present

## 2019-12-29 LAB — BASIC METABOLIC PANEL
Anion gap: 13 (ref 5–15)
BUN: 25 mg/dL — ABNORMAL HIGH (ref 6–20)
CO2: 36 mmol/L — ABNORMAL HIGH (ref 22–32)
Calcium: 9 mg/dL (ref 8.9–10.3)
Chloride: 88 mmol/L — ABNORMAL LOW (ref 98–111)
Creatinine, Ser: 1.53 mg/dL — ABNORMAL HIGH (ref 0.61–1.24)
GFR calc Af Amer: >60 mL/min (ref >60)
GFR calc non Af Amer: 53 mL/min — ABNORMAL LOW (ref >60)
Glucose, Bld: 178 mg/dL — ABNORMAL HIGH (ref 70–99)
Potassium: 4.1 mmol/L (ref 3.5–5.1)
Sodium: 137 mmol/L (ref 135–145)

## 2019-12-29 LAB — CBC
HCT: 46.7 % (ref 39.0–52.0)
Hemoglobin: 15.5 g/dL (ref 13.0–17.0)
MCH: 31.3 pg (ref 26.0–34.0)
MCHC: 33.2 g/dL (ref 30.0–36.0)
MCV: 94.2 fL (ref 80.0–100.0)
Platelets: 345 10*3/uL (ref 150–400)
RBC: 4.96 MIL/uL (ref 4.22–5.81)
RDW: 14 % (ref 11.5–15.5)
WBC: 10.8 10*3/uL — ABNORMAL HIGH (ref 4.0–10.5)
nRBC: 0 % (ref 0.0–0.2)

## 2019-12-29 LAB — TROPONIN I (HIGH SENSITIVITY): Troponin I (High Sensitivity): 18 ng/L — ABNORMAL HIGH (ref <18)

## 2019-12-29 NOTE — ED Triage Notes (Signed)
Pt arrives POV to triage with c/o chest pain x 1 week which he states became worse tonight. Pt has clear lungs at this time.

## 2019-12-30 DIAGNOSIS — I130 Hypertensive heart and chronic kidney disease with heart failure and stage 1 through stage 4 chronic kidney disease, or unspecified chronic kidney disease: Secondary | ICD-10-CM | POA: Diagnosis present

## 2019-12-30 DIAGNOSIS — I5033 Acute on chronic diastolic (congestive) heart failure: Secondary | ICD-10-CM | POA: Diagnosis present

## 2019-12-30 DIAGNOSIS — K5900 Constipation, unspecified: Secondary | ICD-10-CM | POA: Diagnosis present

## 2019-12-30 DIAGNOSIS — I10 Essential (primary) hypertension: Secondary | ICD-10-CM | POA: Diagnosis not present

## 2019-12-30 DIAGNOSIS — F32A Depression, unspecified: Secondary | ICD-10-CM | POA: Diagnosis present

## 2019-12-30 DIAGNOSIS — J9601 Acute respiratory failure with hypoxia: Secondary | ICD-10-CM | POA: Diagnosis not present

## 2019-12-30 DIAGNOSIS — D72829 Elevated white blood cell count, unspecified: Secondary | ICD-10-CM | POA: Diagnosis not present

## 2019-12-30 DIAGNOSIS — I5032 Chronic diastolic (congestive) heart failure: Secondary | ICD-10-CM

## 2019-12-30 DIAGNOSIS — Q761 Klippel-Feil syndrome: Secondary | ICD-10-CM | POA: Diagnosis not present

## 2019-12-30 DIAGNOSIS — N179 Acute kidney failure, unspecified: Secondary | ICD-10-CM | POA: Diagnosis present

## 2019-12-30 DIAGNOSIS — J9690 Respiratory failure, unspecified, unspecified whether with hypoxia or hypercapnia: Secondary | ICD-10-CM | POA: Diagnosis not present

## 2019-12-30 DIAGNOSIS — J189 Pneumonia, unspecified organism: Secondary | ICD-10-CM | POA: Diagnosis present

## 2019-12-30 DIAGNOSIS — Z20822 Contact with and (suspected) exposure to covid-19: Secondary | ICD-10-CM | POA: Diagnosis present

## 2019-12-30 DIAGNOSIS — J984 Other disorders of lung: Secondary | ICD-10-CM | POA: Diagnosis present

## 2019-12-30 DIAGNOSIS — J440 Chronic obstructive pulmonary disease with acute lower respiratory infection: Secondary | ICD-10-CM | POA: Diagnosis present

## 2019-12-30 DIAGNOSIS — J441 Chronic obstructive pulmonary disease with (acute) exacerbation: Secondary | ICD-10-CM | POA: Diagnosis present

## 2019-12-30 DIAGNOSIS — J9621 Acute and chronic respiratory failure with hypoxia: Secondary | ICD-10-CM | POA: Diagnosis present

## 2019-12-30 DIAGNOSIS — T380X5A Adverse effect of glucocorticoids and synthetic analogues, initial encounter: Secondary | ICD-10-CM | POA: Diagnosis not present

## 2019-12-30 DIAGNOSIS — J40 Bronchitis, not specified as acute or chronic: Secondary | ICD-10-CM | POA: Diagnosis not present

## 2019-12-30 DIAGNOSIS — N182 Chronic kidney disease, stage 2 (mild): Secondary | ICD-10-CM | POA: Diagnosis present

## 2019-12-30 DIAGNOSIS — M419 Scoliosis, unspecified: Secondary | ICD-10-CM | POA: Diagnosis present

## 2019-12-30 DIAGNOSIS — F329 Major depressive disorder, single episode, unspecified: Secondary | ICD-10-CM | POA: Diagnosis present

## 2019-12-30 DIAGNOSIS — R600 Localized edema: Secondary | ICD-10-CM | POA: Diagnosis not present

## 2019-12-30 DIAGNOSIS — Z6841 Body Mass Index (BMI) 40.0 and over, adult: Secondary | ICD-10-CM | POA: Diagnosis not present

## 2019-12-30 DIAGNOSIS — J90 Pleural effusion, not elsewhere classified: Secondary | ICD-10-CM | POA: Diagnosis not present

## 2019-12-30 DIAGNOSIS — F419 Anxiety disorder, unspecified: Secondary | ICD-10-CM | POA: Diagnosis present

## 2019-12-30 DIAGNOSIS — H409 Unspecified glaucoma: Secondary | ICD-10-CM | POA: Diagnosis present

## 2019-12-30 DIAGNOSIS — E785 Hyperlipidemia, unspecified: Secondary | ICD-10-CM | POA: Diagnosis present

## 2019-12-30 DIAGNOSIS — J9811 Atelectasis: Secondary | ICD-10-CM | POA: Diagnosis not present

## 2019-12-30 DIAGNOSIS — R739 Hyperglycemia, unspecified: Secondary | ICD-10-CM | POA: Diagnosis present

## 2019-12-30 DIAGNOSIS — R140 Abdominal distension (gaseous): Secondary | ICD-10-CM | POA: Diagnosis not present

## 2019-12-30 DIAGNOSIS — M1990 Unspecified osteoarthritis, unspecified site: Secondary | ICD-10-CM | POA: Diagnosis present

## 2019-12-30 HISTORY — DX: Acute kidney failure, unspecified: N18.2

## 2019-12-30 HISTORY — DX: Acute kidney failure, unspecified: N17.9

## 2019-12-30 LAB — BRAIN NATRIURETIC PEPTIDE: B Natriuretic Peptide: 38.1 pg/mL (ref 0.0–100.0)

## 2019-12-30 LAB — TROPONIN I (HIGH SENSITIVITY): Troponin I (High Sensitivity): 17 ng/L (ref <18)

## 2019-12-30 LAB — RESPIRATORY PANEL BY RT PCR (FLU A&B, COVID)
Influenza A by PCR: NEGATIVE
Influenza B by PCR: NEGATIVE
SARS Coronavirus 2 by RT PCR: NEGATIVE

## 2019-12-30 LAB — PROCALCITONIN: Procalcitonin: <0.1 ng/mL

## 2019-12-30 MED ORDER — ALBUTEROL SULFATE (2.5 MG/3ML) 0.083% IN NEBU
2.5000 mg | INHALATION_SOLUTION | RESPIRATORY_TRACT | Status: DC | PRN
  Administered 2020-01-03: 2.5 mg via RESPIRATORY_TRACT
  Filled 2019-12-30: qty 3

## 2019-12-30 MED ORDER — AZITHROMYCIN 500 MG PO TABS
500.0000 mg | ORAL_TABLET | Freq: Every day | ORAL | Status: AC
  Administered 2019-12-30: 500 mg via ORAL
  Filled 2019-12-30: qty 1

## 2019-12-30 MED ORDER — PAROXETINE HCL 20 MG PO TABS
40.0000 mg | ORAL_TABLET | Freq: Every day | ORAL | Status: DC
  Administered 2019-12-30: 40 mg via ORAL
  Filled 2019-12-30 (×4): qty 2

## 2019-12-30 MED ORDER — HYDRALAZINE HCL 20 MG/ML IJ SOLN: 5.0000 mg | INTRAMUSCULAR | Status: DC | PRN

## 2019-12-30 MED ORDER — AMOXICILLIN-POT CLAVULANATE 875-125 MG PO TABS: 1.0000 | ORAL_TABLET | Freq: Two times a day (BID) | ORAL | 0 refills | Status: DC

## 2019-12-30 MED ORDER — IPRATROPIUM-ALBUTEROL 0.5-2.5 (3) MG/3ML IN SOLN
3.0000 mL | Freq: Once | RESPIRATORY_TRACT | Status: AC
  Administered 2019-12-30: 3 mL via RESPIRATORY_TRACT
  Filled 2019-12-30: qty 3

## 2019-12-30 MED ORDER — ONDANSETRON HCL 4 MG/2ML IJ SOLN: 4.0000 mg | Freq: Three times a day (TID) | INTRAMUSCULAR | Status: DC | PRN

## 2019-12-30 MED ORDER — DM-GUAIFENESIN ER 30-600 MG PO TB12: 1.0000 | ORAL_TABLET | Freq: Two times a day (BID) | ORAL | Status: DC | PRN

## 2019-12-30 MED ORDER — PREDNISONE 20 MG PO TABS: 60.0000 mg | ORAL_TABLET | Freq: Every day | ORAL | 0 refills | Status: DC

## 2019-12-30 MED ORDER — ASCORBIC ACID 500 MG PO TABS
1000.0000 mg | ORAL_TABLET | Freq: Two times a day (BID) | ORAL | Status: DC
  Administered 2019-12-30 – 2020-01-06 (×15): 1000 mg via ORAL
  Filled 2019-12-30 (×15): qty 2

## 2019-12-30 MED ORDER — OMEGA ESSENTIALS BASIC PO LIQD: 15.0000 mL | Freq: Every day | ORAL | Status: DC

## 2019-12-30 MED ORDER — AZITHROMYCIN 250 MG PO TABS
250.0000 mg | ORAL_TABLET | Freq: Every day | ORAL | Status: DC
  Administered 2019-12-31: 250 mg via ORAL
  Filled 2019-12-30: qty 1

## 2019-12-30 MED ORDER — ENOXAPARIN SODIUM 40 MG/0.4ML ~~LOC~~ SOLN
40.0000 mg | Freq: Two times a day (BID) | SUBCUTANEOUS | Status: DC
  Administered 2019-12-30 – 2020-01-06 (×15): 40 mg via SUBCUTANEOUS
  Filled 2019-12-30 (×16): qty 0.4

## 2019-12-30 MED ORDER — OMEGA-3-ACID ETHYL ESTERS 1 G PO CAPS
1.0000 g | ORAL_CAPSULE | Freq: Every day | ORAL | Status: DC
  Administered 2019-12-30 – 2020-01-06 (×8): 1 g via ORAL
  Filled 2019-12-30 (×9): qty 1

## 2019-12-30 MED ORDER — FUROSEMIDE 10 MG/ML IJ SOLN
80.0000 mg | Freq: Once | INTRAMUSCULAR | Status: AC
  Administered 2019-12-30: 80 mg via INTRAVENOUS
  Filled 2019-12-30: qty 8

## 2019-12-30 MED ORDER — IPRATROPIUM-ALBUTEROL 0.5-2.5 (3) MG/3ML IN SOLN
3.0000 mL | RESPIRATORY_TRACT | Status: DC
  Administered 2019-12-30 – 2020-01-05 (×33): 3 mL via RESPIRATORY_TRACT
  Filled 2019-12-30 (×33): qty 3

## 2019-12-30 MED ORDER — MAGNESIUM GLUCONATE 500 MG PO TABS
500.0000 mg | ORAL_TABLET | Freq: Every day | ORAL | Status: DC
  Administered 2019-12-30 – 2020-01-06 (×8): 500 mg via ORAL
  Filled 2019-12-30 (×9): qty 1

## 2019-12-30 MED ORDER — MAGNESIUM 500 MG PO TABS: 1.0000 | ORAL_TABLET | Freq: Every day | ORAL | Status: DC

## 2019-12-30 MED ORDER — RISAQUAD PO CAPS
1.0000 | ORAL_CAPSULE | Freq: Every day | ORAL | Status: DC
  Administered 2019-12-30 – 2020-01-06 (×8): 1 via ORAL
  Filled 2019-12-30 (×10): qty 1

## 2019-12-30 MED ORDER — TESTOSTERONE 20.25 MG/ACT (1.62%) TD GEL: 1.0000 "application " | Freq: Every day | TRANSDERMAL | Status: DC

## 2019-12-30 MED ORDER — METHYLPREDNISOLONE SODIUM SUCC 125 MG IJ SOLR
125.0000 mg | Freq: Once | INTRAMUSCULAR | Status: AC
  Administered 2019-12-30: 125 mg via INTRAVENOUS
  Filled 2019-12-30: qty 2

## 2019-12-30 MED ORDER — ENOXAPARIN SODIUM 40 MG/0.4ML ~~LOC~~ SOLN: 40.0000 mg | SUBCUTANEOUS | Status: DC

## 2019-12-30 MED ORDER — PROBIOTIC ACIDOPHILUS PO TABS: 1.0000 | ORAL_TABLET | Freq: Every day | ORAL | Status: DC

## 2019-12-30 MED ORDER — AMOXICILLIN-POT CLAVULANATE 875-125 MG PO TABS
1.0000 | ORAL_TABLET | Freq: Once | ORAL | Status: AC
  Administered 2019-12-30: 1 via ORAL
  Filled 2019-12-30: qty 1

## 2019-12-30 MED ORDER — B COMPLEX-C PO TABS
1.0000 | ORAL_TABLET | Freq: Every day | ORAL | Status: DC
  Administered 2019-12-30 – 2020-01-06 (×8): 1 via ORAL
  Filled 2019-12-30 (×9): qty 1

## 2019-12-30 MED ORDER — METHYLPREDNISOLONE SODIUM SUCC 125 MG IJ SOLR
60.0000 mg | Freq: Two times a day (BID) | INTRAMUSCULAR | Status: DC
  Administered 2019-12-30 – 2020-01-03 (×8): 60 mg via INTRAVENOUS
  Filled 2019-12-30 (×8): qty 2

## 2019-12-30 MED ORDER — ACETAMINOPHEN 325 MG PO TABS: 650.0000 mg | ORAL_TABLET | Freq: Four times a day (QID) | ORAL | Status: DC | PRN

## 2019-12-30 NOTE — ED Notes (Signed)
Pt given coke and apple juice

## 2019-12-30 NOTE — ED Notes (Addendum)
Pt with increased work of breathing. Oxygen in creased to 6lpm, pt with audible wheezing noted. duoneb started. Reps rate 40, pt states he has to sit upright to breathe. Pt with chopped short verbal response noted, winded with conversation. Call bell at left side.

## 2019-12-30 NOTE — H&P (Signed)
History and Physical    Scott [name] ALP:[id number] DOB: [birth year] DOA: 12/29/2019  Referring MD/NP/PA:   PCP: Margo Common, PA   Patient coming from:  The patient is coming from home.  At baseline, pt is independent for most of ADL.        Chief Complaint: SOB  HPI: Scott [name] is a 49 y.o. male with medical history significant of hypertension, hyperlipidemia, COPD (on 2 L of oxygen only at night), asthma, GERD, depression, anxiety, Klippel-Feil syndrome, congenital hydrocephalus, dCHF, former smoker, CKD stage II, who presents with shortness of breath.  Patient has been having shortness of breath in the past several days, which has been progressively worsening. Patient has dry cough, no fever or chills. Patient states that he had some chest tightness earlier, which has resolved.  Currently denies any chest pain.  No nausea, vomiting, diarrhea, abdominal pain, symptoms of UTI or unilateral weakness. He was found to have oxygen desaturation to 88% on room air, which improved to 96% on 2 L oxygen (patient only 2 L oxygen at night).  ED Course: pt was found to have WBC 10.8, negative Covid PCR, troponin 18 --> 17, BNP 38, worsening renal function, temperature normal, blood pressure 106/70, tachycardia, RR 20. Patient is admitted to [facility] bed as inpatient.  CXR showed: 1. Increasing ill-defined opacities in the bilateral lungs 2. Marked chronic chest wall deformity and curvature of the spine, with unchanged appearance of the fusion hardware.  Review of Systems:   General: no fevers, chills, no body weight gain, has fatigue HEENT: no blurry vision, hearing changes or sore throat Respiratory: has dyspnea, coughing, wheezing CV: has chest tightness, no palpitations GI: no nausea, vomiting, abdominal pain, diarrhea, constipation GU: no dysuria, burning on urination, increased urinary frequency, hematuria  Ext: has leg edema Neuro: no unilateral weakness, numbness, or tingling, no  vision change or hearing loss Skin: no rash, no skin tear. MSK: has chest wall deformity and curvature of the spine,  Heme: No easy bruising.  Travel history: No recent long distant travel.  Allergy:  Allergies  Allergen Reactions  . Codeine Shortness Of Breath    Past Medical History:  Diagnosis Date  . Acid reflux   . Anxiety   . Arthritis   . Asthma   . CHF (congestive heart failure) ([place])   . Depression   . Glaucoma   . Hyperlipidemia   . Hypertension   . Klippel-Feil syndrome     Past Surgical History:  Procedure Laterality Date  . COLOSTOMY    . DENVER SHUNT PLACEMENT      Social History:  reports that he quit smoking about 13 years ago. His smoking use included cigarettes. He has a 10.00 pack-year smoking history. He has never used smokeless tobacco. He reports that he does not drink alcohol and does not use drugs.  Family History:  Family History  Problem Relation Age of Onset  . Multiple sclerosis Mother   . Colon cancer Father   . Heart disease Father   . Lung cancer Maternal Grandmother   . Thyroid cancer Maternal Grandfather   . Ovarian cancer Paternal Grandmother   . Cancer Paternal Grandfather      Prior to Admission medications   Medication Sig Start Date End Date Taking? Authorizing Provider  albuterol (VENTOLIN HFA) 108 (90 Base) MCG/ACT inhaler Inhale 2 puffs into the lungs every 4 (four) hours as needed for wheezing or shortness of breath. 12/12/19  Yes Loel Dubonnet,  NP  ANDROGEL PUMP 20.25 MG/ACT (1.62%) GEL Apply 1 application topically daily.  09/04/14  Yes [provider]  Ascorbic Acid (VITAMIN C) 1000 MG tablet Take 1,000 mg by mouth 2 (two) times a day.   Yes [provider]  b complex vitamins capsule Take 1 capsule by mouth daily.   Yes [provider]  glucosamine-chondroitin 500-400 MG tablet Take 2 tablets by mouth daily.   Yes [provider]  Lactobacillus (PROBIOTIC ACIDOPHILUS PO) Take by  mouth daily.    Yes [provider]  Magnesium 500 MG TABS Take 1 tablet by mouth daily. Patient says he takes magnesium orotate   Yes [provider]  metolazone (ZAROXOLYN) 2.5 MG tablet Take 1 tablet (2.5 mg total) by mouth 3 (three) times a week. 10/10/19 04/14/20 Yes Loel Dubonnet, NP  Omega-3 Fatty Acids (OMEGA ESSENTIALS BASIC) LIQD Take 15 mLs by mouth daily.   Yes [provider]  PARoxetine (PAXIL) 40 MG tablet Take 1 tablet (40 mg total) by mouth daily. 06/21/19  Yes [name], [name], PA  potassium chloride SA (KLOR-CON) 20 MEQ tablet TAKE 2 TABLETS(40 MEQ) BY MOUTH TWICE DAILY 10/05/19  Yes Gollan, Kathlene November, MD  Spacer/Aero-Holding Chambers DEVI 1 Device by Does not apply route 4 (four) times daily. 03/01/18  Yes Dunn, Areta Haber, PA-C  spironolactone (ALDACTONE) 50 MG tablet Take 50 mg by mouth daily.   Yes [provider]  torsemide (DEMADEX) 20 MG tablet Take 3 tablets (60 mg total) by mouth 2 (two) times daily. 12/11/19 06/08/20 Yes Dunn, Areta Haber, PA-C  amoxicillin-clavulanate (AUGMENTIN) 875-125 MG tablet Take 1 tablet by mouth 2 (two) times daily for 5 days. 12/30/19 01/04/20  Rudene Re, MD  brinzolamide (AZOPT) 1 % ophthalmic suspension Place 1 drop into both eyes daily.  Patient not taking: Reported on 12/30/2019    [provider]  latanoprost (XALATAN) 0.005 % ophthalmic solution Place 1 drop into both eyes at bedtime.  Patient not taking: Reported on 12/30/2019 07/28/07   [provider]  predniSONE (DELTASONE) 20 MG tablet Take 3 tablets (60 mg total) by mouth daily for 4 days. 12/30/19 01/03/20  Rudene Re, MD    Physical Exam: Vitals:   12/30/19 0615 12/30/19 0630 12/30/19 0645 12/30/19 0700  BP:      Pulse: (!) 116 (!) 111 (!) 121 (!) 115  Resp:      Temp:      TempSrc:      SpO2: 96% 95% 98% 96%  Weight:      Height:       General: Not in acute distress HEENT:       Eyes: PERRL, EOMI, no scleral  icterus.       ENT: No discharge from the ears and nose, no pharynx injection, no tonsillar enlargement.        Neck: No JVD, no bruit, no mass felt. Heme: No neck lymph node enlargement. Cardiac: S1/S2, RRR, No murmurs, No gallops or rubs. Respiratory: has wheezing bilaterally  GI: Soft, nondistended, nontender, no rebound pain, no organomegaly, BS present. GU: No hematuria Ext: has trace leg edema bilaterally. 1+DP/PT pulse bilaterally. Musculoskeletal: has chest wall deformity and curvature of the spine Skin: No rashes.  Neuro: Alert, oriented X3, cranial nerves II-XII grossly intact, moves all extremities. Psych: Patient is not psychotic, no suicidal or hemocidal ideation.  Labs on Admission: I have personally reviewed following labs and imaging studies  CBC: Recent Labs  Lab 12/29/19  2056  WBC 10.8*  HGB 15.5  HCT 46.7  MCV 94.2  PLT 378   Basic Metabolic Panel: Recent Labs  Lab 12/29/19 2056  NA 137  K 4.1  CL 88*  CO2 36*  GLUCOSE 178*  BUN 25*  CREATININE 1.53*  CALCIUM 9.0   GFR: Estimated Creatinine Clearance: 60.1 mL/min (A) (by C-G formula based on SCr of 1.53 mg/dL (H)). Liver Function Tests: No results for input(s): AST, ALT, ALKPHOS, BILITOT, PROT, ALBUMIN in the last 168 hours. No results for input(s): LIPASE, AMYLASE in the last 168 hours. No results for input(s): AMMONIA in the last 168 hours. Coagulation Profile: No results for input(s): INR, PROTIME in the last 168 hours. Cardiac Enzymes: No results for input(s): CKTOTAL, CKMB, CKMBINDEX, TROPONINI in the last 168 hours. BNP (last 3 results) No results for input(s): PROBNP in the last 8760 hours. HbA1C: No results for input(s): HGBA1C in the last 72 hours. CBG: No results for input(s): GLUCAP in the last 168 hours. Lipid Profile: No results for input(s): CHOL, HDL, LDLCALC, TRIG, CHOLHDL, LDLDIRECT in the last 72 hours. Thyroid Function Tests: No results for input(s): TSH, T4TOTAL, FREET4,  T3FREE, THYROIDAB in the last 72 hours. Anemia Panel: No results for input(s): VITAMINB12, FOLATE, FERRITIN, TIBC, IRON, RETICCTPCT in the last 72 hours. Urine analysis:    Component Value Date/Time   BILIRUBINUR Neg 06/20/2018 1046   PROTEINUR Positive (A) 06/20/2018 1046   UROBILINOGEN 1.0 06/20/2018 1046   NITRITE Neg 06/20/2018 1046   LEUKOCYTESUR Large (3+) (A) 06/20/2018 1046   Sepsis Labs: @LABRCNTIP (procalcitonin:4,lacticidven:4) ) Recent Results (from the past 240 hour(s))  Respiratory Panel by RT PCR (Flu A&B, Covid) - Nasopharyngeal Swab     Status: None   Collection Time: 12/30/19  3:08 AM   Specimen: Nasopharyngeal Swab  Result Value Ref Range Status   SARS Coronavirus 2 by RT PCR NEGATIVE NEGATIVE Final    Comment: (NOTE) SARS-CoV-2 target nucleic acids are NOT DETECTED.  The SARS-CoV-2 RNA is generally detectable in upper respiratoy specimens during the acute phase of infection. The lowest concentration of SARS-CoV-2 viral copies this assay can detect is 131 copies/mL. A negative result does not preclude SARS-Cov-2 infection and should not be used as the sole basis for treatment or other patient management decisions. A negative result may occur with  improper specimen collection/handling, submission of specimen other than nasopharyngeal swab, presence of viral mutation(s) within the areas targeted by this assay, and inadequate number of viral copies (<131 copies/mL). A negative result must be combined with clinical observations, patient history, and epidemiological information. The expected result is Negative.  Fact Sheet for Patients:  PinkCheek.be  Fact Sheet for Healthcare Providers:  GravelBags.it  This test is no t yet approved or cleared by the [place] FDA and  has been authorized for detection and/or diagnosis of SARS-CoV-2 by FDA under an Emergency Use Authorization (EUA). This EUA will  remain  in effect (meaning this test can be used) for the duration of the COVID-19 declaration under Section 564(b)(1) of the Act, 21 U.S.C. section 360bbb-3(b)(1), unless the authorization is terminated or revoked sooner.     Influenza A by PCR NEGATIVE NEGATIVE Final   Influenza B by PCR NEGATIVE NEGATIVE Final    Comment: (NOTE) The Xpert Xpress SARS-CoV-2/FLU/RSV assay is intended as an aid in  the diagnosis of influenza from Nasopharyngeal swab specimens and  should not be used as a sole basis for treatment. Nasal washings and  aspirates are  unacceptable for Xpert Xpress SARS-CoV-2/FLU/RSV  testing.  Fact Sheet for Patients: PinkCheek.be  Fact Sheet for Healthcare Providers: GravelBags.it  This test is not yet approved or cleared by the [place] FDA and  has been authorized for detection and/or diagnosis of SARS-CoV-2 by  FDA under an Emergency Use Authorization (EUA). This EUA will remain  in effect (meaning this test can be used) for the duration of the  Covid-19 declaration under Section 564(b)(1) of the Act, 21  U.S.C. section 360bbb-3(b)(1), unless the authorization is  terminated or revoked. Performed at [facility], [address] [place], [place] [address]      Radiological Exams on Admission: DG Chest 2 View  Result Date: 12/29/2019 CLINICAL DATA:  Chest pain, for 1 week now acutely worsening EXAM: CHEST - 2 VIEW COMPARISON:  Radiograph 12/12/2019 FINDINGS: Severe scoliotic curvature of the spine with exaggerated thoracic kyphosis and posterior fusion hardware which appears unchanged from the comparison exam. Marked chest wall deformity is again noted as well. There are increasing ill-defined opacities in the bilateral lungs which may be accentuated by this chest wall deformity. Mild vascular congestion is noted as well. Cardiomediastinal contours are similar to comparison. No pneumothorax or  visible effusion. IMPRESSION: 1. Increasing ill-defined opacities in the bilateral lungs, could reflect atypical infection or edema, among other possibilities. 2. Marked chronic chest wall deformity and curvature of the spine with unchanged appearance of the fusion hardware. Electronically Signed   By: Lovena Le M.D.   On: 12/29/2019 21:13     EKG: Independently reviewed. Sinus rhythm, QTC 455, tachycardia, LAE, LAD, poor R wave progression  Assessment/Plan Principal Problem:   COPD exacerbation (HCC) Active Problems:   Essential hypertension   Chronic diastolic heart failure (HCC)   Acute on chronic respiratory failure with hypoxia (HCC)   Depression   Acute renal failure superimposed on stage 2 chronic kidney disease (HCC)  Acute on chronic respiratory failure with hypoxia due to COPD exacerbation ([place]):  -Will admit to med-surg bed as inpatient -Bronchodilators -Solu-Medrol 40 mg IV tid -Z pak  (patient received 1 dose of Augmentin in ED) -Mucinex for cough  -Incentive spirometry -Follow up blood culture x2, sputum culture -Nasal cannula oxygen as needed to maintain O2 saturation 93% or greater  Chronic diastolic heart failure: 2D echo 02/05/2018 showed EF of 55 to 60% with grade 1 diastolic dysfunction.  Patient has trace leg edema.  No obvious pulmonary edema on chest x-ray.  BNP 38.  Does not seem to have CHF exacerbation. -Hold diuretics due to worsening renal function (torsemide, spironolactone and metolazone)  Essential hypertension -IV hydralazine as needed -Hold diuretics due to worsening renal function  Depression -Continue home medications  Acute renal failure superimposed on stage 2 chronic kidney disease ([place]): Baseline creatinine 1.1-1.2.  His creatinine is 1.53, BUN 28. -Hold diuretics -Avoid using renal toxic medications -Follow-up of BMP    DVT ppx:  SQ Lovenox Code Status: Full code Family Communication:  Yes, patient's fianc by phone    Disposition Plan:  Anticipate discharge back to previous environment Consults called:  none Admission status: Med-surg bed as inpt     Status is: Inpatient  Remains inpatient appropriate because:Inpatient level of care appropriate due to severity of illness. Patient has multiple comorbidities, now presents with acute on chronic respiratory failure due to COPD exacerbation. Patient also has worsening renal function. His presentation is highly complicated. Patient is at high risk of deteriorating. Will need to be treated in hospital  for at least 2 days.   Dispo: The patient is from: Home              Anticipated d/c is to: Home              Anticipated d/c date is: 2 days              Patient currently is not medically stable to d/c.          Date of Service 12/30/2019    Ivor Costa [facility]   If 7PM-7AM, please contact night-coverage [email or web address] 12/30/2019, 7:58 AM

## 2019-12-30 NOTE — ED Notes (Signed)
Pt sitting upright, no respiratory distress.

## 2019-12-30 NOTE — ED Notes (Signed)
Trial without O2, patient O2 sat at 88% on room air.  Patient states he does not have portable O2 device with him.

## 2019-12-30 NOTE — ED Notes (Signed)
Pt states he took the following Sat morning: 2.5mg  of metolozone 80 mg of torsemide 1 tablet - spironolactone  Saturday afternoon: 80 mg of torsemide

## 2019-12-30 NOTE — ED Notes (Signed)
Lab called to draw BC and Tubes. Unable to get access

## 2019-12-30 NOTE — ED Notes (Signed)
Pt able to move to wheelchair for move. Pt taken off O2 for transfer to new room, approximately 1 minute. O2 Sats dipped to 87% with an increase of WOB. O2 restarted upon arrival to new room, Sats rebounded to 94% within 30 sec and WOB decreased. Pt was able to stand and pivot to unit bed. Requested to sit upright.

## 2019-12-30 NOTE — ED Provider Notes (Addendum)
[facility] Emergency Department Provider Note  ____________________________________________  Time seen: Approximately 3:52 AM  I have reviewed the triage vital signs and the nursing notes.   HISTORY  Chief Complaint Chest Pain   HPI Scott [name] is a 49 y.o. male with a history of CHF with preserved EF, restrictive lung disease, Klippel-Feil syndrome, hypertension, hyperlipidemia who presents for evaluation of shortness of breath.  Patient reports worsening orthopnea for the last several weeks and has not been able to sleep.  He uses 2 L of oxygen at bedtime only.  Over the last couple of days patient has had no shortness of breath that is constant.  He has had a mild dry cough.  No fever.  Is complaining of tightness in his chest associated with the shortness of breath and wheezing.  Has been using his inhaler.  No fever, chills, myalgias, sore throat, loss of taste or smell.  Patient has been vaccinated for Covid.  No known exposures.  No prior history of PE or DVT, no recent travel immobilization, no leg pain or swelling, no hemoptysis or exogenous hormones.  Patient has been compliant with his diuretics.  He has noticed a 8 pound weight gain in the last week.  Past Medical History:  Diagnosis Date  . Acid reflux   . Anxiety   . Arthritis   . Asthma   . CHF (congestive heart failure) ([place])   . Depression   . Glaucoma   . Hyperlipidemia   . Hypertension   . Klippel-Feil syndrome     Patient Active Problem List   Diagnosis Date Noted  . Chronic diastolic heart failure ([place]) 02/16/2018  . Accelerated hypertension 02/05/2018  . Acute diastolic heart failure ([place])   . Kyphoscoliosis and scoliosis 01/30/2018  . Erectile dysfunction 09/09/2016  . Disorder of bursae of shoulder region 09/07/2016  . Impingement syndrome of shoulder region 09/07/2016  . Anxiety 09/11/2014  . Airway hyperreactivity 09/11/2014  . CN (constipation) 09/11/2014  . Alopecia,  male pattern 09/11/2014  . Decreased libido 09/11/2014  . Dysfunction of eustachian tube 09/11/2014  . Groin strain 09/11/2014  . Cephalalgia 09/11/2014  . Umbilical hernia 81/19/1478  . Essential hypertension 09/11/2014  . Migraine 09/11/2014  . Neoplasm of uncertain behavior 29/56/2130  . Hernia of anterior abdominal wall 09/11/2014  . Contact dermatitis due to Genus Toxicodendron 09/11/2014  . Open-angle glaucoma 05/20/2009  . Congenital hydrocephalus ([place]) 05/20/2009  . 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 Enteritis presumed infectious 06/11/2008  . HLD (hyperlipidemia) 03/15/2008  . Clinical depression 06/13/2007    Past Surgical History:  Procedure Laterality Date  . COLOSTOMY    . [place] SHUNT PLACEMENT      Prior to Admission medications   Medication Sig Start Date End Date Taking? Authorizing Provider  albuterol (VENTOLIN HFA) 108 (90 Base) MCG/ACT inhaler Inhale 2 puffs into the lungs every 4 (four) hours as needed for wheezing or shortness of breath. 12/12/19   Loel Dubonnet, NP  amoxicillin-clavulanate (AUGMENTIN) 875-125 MG tablet Take 1 tablet by mouth 2 (two) times daily for 5 days. 12/30/19 01/04/20  Rudene Re, MD  ANDROGEL PUMP 20.25 MG/ACT (1.62%) GEL Apply 1 application topically daily.  09/04/14   [provider]  Ascorbic Acid (VITAMIN C) 1000 MG tablet Take 1,000 mg by mouth 2 (two) times a day.    [provider]  b complex vitamins capsule Take 1 capsule by mouth daily.    [provider]  brinzolamide (  AZOPT) 1 % ophthalmic suspension Place 1 drop into both eyes daily.     [provider]  glucosamine-chondroitin 500-400 MG tablet Take 2 tablets by mouth daily.    [provider]  Lactobacillus (PROBIOTIC ACIDOPHILUS PO) Take by mouth daily.     [provider]  latanoprost (XALATAN) 0.005 % ophthalmic solution Place 1 drop into both eyes at bedtime.  07/28/07   [provider]  Magnesium  500 MG TABS Take 1 tablet by mouth daily. Patient says he takes magnesium orotate    [provider]  metolazone (ZAROXOLYN) 2.5 MG tablet Take 1 tablet (2.5 mg total) by mouth 3 (three) times a week. 10/10/19 04/14/20  Loel Dubonnet, NP  Omega-3 Fatty Acids (OMEGA ESSENTIALS BASIC) LIQD Take 15 mLs by mouth daily.    [provider]  PARoxetine (PAXIL) 40 MG tablet Take 1 tablet (40 mg total) by mouth daily. 06/21/19   [name], [name], PA  potassium chloride SA (KLOR-CON) 20 MEQ tablet TAKE 2 TABLETS(40 MEQ) BY MOUTH TWICE DAILY 10/05/19   Minna Merritts, MD  predniSONE (DELTASONE) 20 MG tablet Take 3 tablets (60 mg total) by mouth daily for 4 days. 12/30/19 01/03/20  Rudene Re, MD  Spacer/Aero-Holding Chambers DEVI 1 Device by Does not apply route 4 (four) times daily. 03/01/18   Dunn, Areta Haber, PA-C  spironolactone (ALDACTONE) 50 MG tablet Take 50 mg by mouth daily.    [provider]  torsemide (DEMADEX) 20 MG tablet Take 3 tablets (60 mg total) by mouth 2 (two) times daily. 12/11/19 06/08/20  Rise Mu, PA-C    Allergies Codeine  Family History  Problem Relation Age of Onset  . Multiple sclerosis Mother   . Colon cancer Father   . Heart disease Father   . Lung cancer Maternal Grandmother   . Thyroid cancer Maternal Grandfather   . Ovarian cancer Paternal Grandmother   . Cancer Paternal Grandfather     Social History Social History   Tobacco Use  . Smoking status: Former Smoker    Packs/day: 1.00    Years: 10.00    Pack years: 10.00    Types: Cigarettes    Quit date: 05/2006    Years since quitting: 13.6  . Smokeless tobacco: Never Used  . Tobacco comment: quit 05/2006  Vaping Use  . Vaping Use: Former  . Devices: tried once  Substance Use Topics  . Alcohol use: No    Alcohol/week: 0.0 standard drinks  . Drug use: No    Review of Systems  Constitutional: Negative for fever. Eyes: Negative for visual changes. ENT: Negative for  sore throat. Neck: No neck pain  Cardiovascular: Negative for chest pain. + Chest tightness, orthopnea  respiratory: + shortness of breath, cough, wheezing Gastrointestinal: Negative for abdominal pain, vomiting or diarrhea. Genitourinary: Negative for dysuria. Musculoskeletal: Negative for back pain. Skin: Negative for rash. Neurological: Negative for headaches, weakness or numbness. Psych: No SI or HI  ____________________________________________   PHYSICAL EXAM:  VITAL SIGNS: ED Triage Vitals  Enc Vitals Group     BP 12/29/19 2047 136/82     Pulse Rate 12/29/19 2047 (!) 112     Resp 12/29/19 2047 18     Temp 12/29/19 2047 98.2 F (36.8 C)     Temp Source 12/29/19 2047 Oral     SpO2 12/29/19 2047 96 %     Weight 12/29/19 2044 220 lb (99.8 kg)     Height 12/29/19 2044  5\' 2"  (1.575 m)     Head Circumference --      Peak Flow --      Pain Score 12/29/19 2044 0     Pain Loc --      Pain Edu? --      Excl. in [place]? --     Constitutional: Alert and oriented. Well appearing and in no apparent distress. HEENT:      Head: Normocephalic and atraumatic.         Eyes: Conjunctivae are normal. Sclera is non-icteric.       Mouth/Throat: Mucous membranes are moist.       Neck: Supple with no signs of meningismus. Cardiovascular: Tachycardic with regular rhythm Respiratory: Increased work of breathing, tachypneic, satting 88% on room air which improved on 2 L nasal cannula, audible wheezing however with auscultation the lungs are clear with good air movement, no wheezing or crackles. Gastrointestinal: Soft, non tender, and non distended. Musculoskeletal: Trace edema bilateral lower extremity Neurologic: Normal speech and language. Face is symmetric. Moving all extremities. No gross focal neurologic deficits are appreciated. Skin: Skin is warm, dry and intact. No rash noted. Psychiatric: Mood and affect are normal. Speech and behavior are  normal.  ____________________________________________   LABS (all labs ordered are listed, but only abnormal results are displayed)  Labs Reviewed  BASIC METABOLIC PANEL - Abnormal; Notable for the following components:      Result Value   Chloride 88 (*)    CO2 36 (*)    Glucose, Bld 178 (*)    BUN 25 (*)    Creatinine, Ser 1.53 (*)    GFR calc non Af Amer 53 (*)    All other components within normal limits  CBC - Abnormal; Notable for the following components:   WBC 10.8 (*)    All other components within normal limits  TROPONIN I (HIGH SENSITIVITY) - Abnormal; Notable for the following components:   Troponin I (High Sensitivity) 18 (*)    All other components within normal limits  RESPIRATORY PANEL BY RT PCR (FLU A&B, COVID)  BRAIN NATRIURETIC PEPTIDE  PROCALCITONIN  TROPONIN I (HIGH SENSITIVITY)   ____________________________________________  EKG  ED ECG REPORT I, Rudene Re, the attending physician, personally viewed and interpreted this ECG.  Sinus tachycardia, rate of 112, normal intervals, normal axis, no ST elevations or depressions.  Unchanged from prior. ____________________________________________  RADIOLOGY  I have personally reviewed the images performed during this visit and I agree with the Radiologist's read.   Interpretation by Radiologist:  DG Chest 2 View  Result Date: 12/29/2019 CLINICAL DATA:  Chest pain, for 1 week now acutely worsening EXAM: CHEST - 2 VIEW COMPARISON:  Radiograph 12/12/2019 FINDINGS: Severe scoliotic curvature of the spine with exaggerated thoracic kyphosis and posterior fusion hardware which appears unchanged from the comparison exam. Marked chest wall deformity is again noted as well. There are increasing ill-defined opacities in the bilateral lungs which may be accentuated by this chest wall deformity. Mild vascular congestion is noted as well. Cardiomediastinal contours are similar to comparison. No pneumothorax or  visible effusion. IMPRESSION: 1. Increasing ill-defined opacities in the bilateral lungs, could reflect atypical infection or edema, among other possibilities. 2. Marked chronic chest wall deformity and curvature of the spine with unchanged appearance of the fusion hardware. Electronically Signed   By: Lovena Le M.D.   On: 12/29/2019 21:13     ____________________________________________   PROCEDURES  Procedure(s) performed:yes .1-3 Lead EKG Interpretation Performed by: Alfred Levins,  [place], [place] Authorized by: Alfred Levins, [place], MD     Interpretation: non-specific     ECG rate assessment: tachycardic     Rhythm: sinus tachycardia     Ectopy: none     Critical Care performed:  Yes  CRITICAL CARE Performed by: Rudene Re  ?  Total critical care time: 40 min  Critical care time was exclusive of separately billable procedures and treating other patients.  Critical care was necessary to treat or prevent imminent or life-threatening deterioration.  Critical care was time spent personally by me on the following activities: development of treatment plan with patient and/or surrogate as well as nursing, discussions with consultants, evaluation of patient's response to treatment, examination of patient, obtaining history from patient or surrogate, ordering and performing treatments and interventions, ordering and review of laboratory studies, ordering and review of radiographic studies, pulse oximetry and re-evaluation of patient's condition.  ____________________________________________   INITIAL IMPRESSION / ASSESSMENT AND PLAN / ED COURSE   49 y.o. male with a history of CHF with preserved EF, restrictive lung disease, Klippel-Feil syndrome, hypertension, hyperlipidemia who presents for evaluation of shortness of breath, cough, wheezing, 8-lb weight gain.  Patient with mild respiratory distress, tachypneic, satting 88% on room air with use of accessory muscles of respiration,  he does have audible wheezing however with auscultation of the lungs, the lungs sound clear with no wheezing or crackles, he is moving good air.  He has trace pitting edema in bilateral lower extremity.  Differential diagnosis including CHF exacerbation versus COPD exacerbation versus bronchitis versus pneumonia versus Covid versus flu versus PE.  Chest x-ray visualized by me showing opacities in bilateral lungs, confirmed by radiology.  Patient is afebrile with mild elevated white count of 10.8.  Mild hyperglycemia with no evidence of DKA, kidney function seems to be at baseline.  High-sensitivity troponin x2 -.  BNP is normal at 38.  Covid and flu swab are pending.  Procalcitonin is pending.  We will treat with duo nebs, Solu-Medrol, 80 mg of IV Lasix.  Procalcitonin is elevated will give antibiotics.  Old medical records reviewed.  Patient placed on telemetry for close monitoring.  _________________________ 5:40 AM on 12/30/2019 -----------------------------------------  Procalcitonin negative.  Covid and flu negative.  Patient clinically looks improved however still hypoxic to 88%.  Patient does have oxygen at home but is a stationary tank next to his bed that he uses at bedtime only.  He does not have a portable oxygen.  Therefore will have to be admitted to the hospitalist service for acute on chronic respiratory failure.    _____________________________________________ Please note:  Patient was evaluated in Emergency Department today for the symptoms described in the history of present illness. Patient was evaluated in the context of the global COVID-19 pandemic, which necessitated consideration that the patient might be at risk for infection with the SARS-CoV-2 virus that causes COVID-19. Institutional protocols and algorithms that pertain to the evaluation of patients at risk for COVID-19 are in a state of rapid change based on information released by regulatory bodies including the CDC and  federal and state organizations. These policies and algorithms were followed during the patient's care in the ED.  Some ED evaluations and interventions may be delayed as a result of limited staffing during the pandemic.   [place] Controlled Substance Database was reviewed by me. ____________________________________________   FINAL CLINICAL IMPRESSION(S) / ED DIAGNOSES   Final diagnoses:  Bronchitis  Acute on chronic respiratory failure with hypoxia ([place])  NEW MEDICATIONS STARTED DURING THIS VISIT:  ED Discharge Orders         Ordered    amoxicillin-clavulanate (AUGMENTIN) 875-125 MG tablet  2 times daily        12/30/19 0541    predniSONE (DELTASONE) 20 MG tablet  Daily        12/30/19 0541           Note:  This document was prepared using Dragon voice recognition software and may include unintentional dictation errors.    Alfred Levins, [place], MD 12/30/19 [place], [place], MD 12/30/19 [phone]

## 2019-12-30 NOTE — ED Notes (Addendum)
Pt to the room in a wheelchair, RR 18, O2 sat 97% on 2L, pt uses O2 at home only when he is sleeping, increased work of breathing noted.  Pt states he has a hx of CHF, taking diuretics but he states they have not been working for him, he states he cannot breathe when he lies down which has led to being sleep deprived.  Pt denies chest pain at this time.

## 2019-12-31 ENCOUNTER — Ambulatory Visit: Payer: Medicare Other | Admitting: Family Medicine

## 2019-12-31 ENCOUNTER — Encounter: Payer: Self-pay | Admitting: Internal Medicine

## 2019-12-31 ENCOUNTER — Inpatient Hospital Stay: Payer: Medicare Other

## 2019-12-31 DIAGNOSIS — J9621 Acute and chronic respiratory failure with hypoxia: Secondary | ICD-10-CM | POA: Diagnosis not present

## 2019-12-31 DIAGNOSIS — J441 Chronic obstructive pulmonary disease with (acute) exacerbation: Secondary | ICD-10-CM | POA: Diagnosis not present

## 2019-12-31 DIAGNOSIS — N179 Acute kidney failure, unspecified: Secondary | ICD-10-CM | POA: Diagnosis not present

## 2019-12-31 DIAGNOSIS — N182 Chronic kidney disease, stage 2 (mild): Secondary | ICD-10-CM | POA: Diagnosis not present

## 2019-12-31 LAB — BASIC METABOLIC PANEL
Anion gap: 15 (ref 5–15)
BUN: 47 mg/dL — ABNORMAL HIGH (ref 6–20)
CO2: 37 mmol/L — ABNORMAL HIGH (ref 22–32)
Calcium: 9 mg/dL (ref 8.9–10.3)
Chloride: 84 mmol/L — ABNORMAL LOW (ref 98–111)
Creatinine, Ser: 2.04 mg/dL — ABNORMAL HIGH (ref 0.61–1.24)
GFR calc Af Amer: 43 mL/min — ABNORMAL LOW (ref >60)
GFR calc non Af Amer: 37 mL/min — ABNORMAL LOW (ref >60)
Glucose, Bld: 240 mg/dL — ABNORMAL HIGH (ref 70–99)
Potassium: 3.6 mmol/L (ref 3.5–5.1)
Sodium: 136 mmol/L (ref 135–145)

## 2019-12-31 LAB — CBC
HCT: 46.8 % (ref 39.0–52.0)
Hemoglobin: 14.9 g/dL (ref 13.0–17.0)
MCH: 30.7 pg (ref 26.0–34.0)
MCHC: 31.8 g/dL (ref 30.0–36.0)
MCV: 96.3 fL (ref 80.0–100.0)
Platelets: 363 10*3/uL (ref 150–400)
RBC: 4.86 MIL/uL (ref 4.22–5.81)
RDW: 14 % (ref 11.5–15.5)
WBC: 15.9 10*3/uL — ABNORMAL HIGH (ref 4.0–10.5)
nRBC: 0 % (ref 0.0–0.2)

## 2019-12-31 LAB — HIV ANTIBODY (ROUTINE TESTING W REFLEX): HIV Screen 4th Generation wRfx: NONREACTIVE

## 2019-12-31 MED ORDER — SENNOSIDES-DOCUSATE SODIUM 8.6-50 MG PO TABS: 1.0000 | ORAL_TABLET | Freq: Every day | ORAL | Status: DC | PRN

## 2019-12-31 MED ORDER — POLYETHYLENE GLYCOL 3350 17 G PO PACK
17.0000 g | PACK | Freq: Two times a day (BID) | ORAL | Status: DC
  Administered 2019-12-31 – 2020-01-05 (×10): 17 g via ORAL
  Filled 2019-12-31 (×12): qty 1

## 2019-12-31 MED ORDER — PANTOPRAZOLE SODIUM 40 MG PO TBEC
40.0000 mg | DELAYED_RELEASE_TABLET | Freq: Every day | ORAL | Status: DC
  Administered 2019-12-31 – 2020-01-06 (×7): 40 mg via ORAL
  Filled 2019-12-31 (×6): qty 1

## 2019-12-31 MED ORDER — PAROXETINE HCL 20 MG PO TABS
40.0000 mg | ORAL_TABLET | Freq: Every day | ORAL | Status: DC
  Administered 2020-01-01 – 2020-01-05 (×5): 40 mg via ORAL
  Filled 2019-12-31 (×5): qty 2

## 2019-12-31 MED ORDER — DOCUSATE SODIUM 100 MG PO CAPS
200.0000 mg | ORAL_CAPSULE | Freq: Every day | ORAL | Status: DC
  Administered 2019-12-31 – 2020-01-06 (×7): 200 mg via ORAL
  Filled 2019-12-31 (×7): qty 2

## 2019-12-31 MED ORDER — ALUM & MAG HYDROXIDE-SIMETH 200-200-20 MG/5ML PO SUSP: 30.0000 mL | Freq: Four times a day (QID) | ORAL | Status: DC | PRN

## 2019-12-31 MED ORDER — LORAZEPAM 1 MG PO TABS
1.0000 mg | ORAL_TABLET | Freq: Once | ORAL | Status: DC
  Filled 2019-12-31: qty 1

## 2019-12-31 MED ORDER — DEXTROSE 5 % IV SOLN
250.0000 mg | INTRAVENOUS | Status: DC
  Administered 2020-01-01 – 2020-01-02 (×2): 250 mg via INTRAVENOUS
  Filled 2019-12-31 (×2): qty 250

## 2019-12-31 NOTE — Evaluation (Signed)
Occupational Therapy Evaluation Patient Details Name: Scott [name] MRN: [id number] DOB: 1970-04-21 Today's Date: 12/31/2019    History of Present Illness Scott [name] is a 49 y/o male who was admitted for COPD exacerbation. CC included worseing SOB x several days. PMH includes hypertension, hyperlipidemia, COPD (on 2 L of oxygen only at night), asthma, GERD, depression, anxiety, Klippel-Feil syndrome, congenital hydrocephalus, dCHF, former smoker,  and CKD stage II.    Clinical Impression   Scott [name] was seen for OT evaluation this date. Prior to hospital admission, pt was Independent in mobility and I/ADLs including working and driving. Pt lives c fiancee in home c ramped entrance. Pt presents to acute OT demonstrating impaired ADL performance and functional mobility 2/2 decreased activity tolerance and functional strength defiicts. Pt currently requires SUPERVISION grooming standing sinkside - VCs for PLB and standing rest breaks. SpO2 94% on 6L [place] during standing grooming, desat to 89% ater ~60mins - resolved c seated rest break. MOD I self-feeding seated EOB. Independent for ADL t/f.  Pt would benefit from skilled OT to address noted impairments and functional limitations (see below for any additional details) in order to maximize safety and independence while minimizing falls risk and caregiver burden. Upon hospital discharge, recommend HHOT to maximize pt safety and return to functional independence during meaningful occupations of daily life.     Follow Up Recommendations  Home health OT    Equipment Recommendations  None recommended by OT    Recommendations for Other Services       Precautions / Restrictions Precautions Precautions: Fall Restrictions Weight Bearing Restrictions: No      Mobility Bed Mobility Overal bed mobility: Modified Independent  Transfers Overall transfer level: Modified independent General transfer comment: MOD I for O2 and lines mgmt     Balance Overall  balance assessment: Needs assistance Sitting-balance support: No upper extremity supported;Feet supported Sitting balance-Leahy Scale: Good   Standing balance support: No upper extremity supported;During functional activity Standing balance-Leahy Scale: Good Standing balance comment: SBA reaching outside BOS       ADL either performed or assessed with clinical judgement   ADL Overall ADL's : Needs assistance/impaired        General ADL Comments: SUPERVISION grooming standing sinkside - VCs for PLB and standing rest breaks. MOD I self-feeding seated EOB. Independent for ADL t/f                  Pertinent Vitals/Pain Pain Assessment: No/denies pain     Hand Dominance Right   Extremity/Trunk Assessment Upper Extremity Assessment Upper Extremity Assessment: Overall WFL for tasks assessed (Pt has webbed hands 2/2 Klippel-Feil syndrome)   Lower Extremity Assessment Lower Extremity Assessment: Overall WFL for tasks assessed   Cervical / Trunk Assessment Cervical / Trunk Assessment: Kyphotic   Communication Communication Communication: No difficulties   Cognition Arousal/Alertness: Awake/alert Behavior During Therapy: WFL for tasks assessed/performed Overall Cognitive Status: Within Functional Limits for tasks assessed              General Comments: Pt alert and oriented x 4.   General Comments  SpO2 97% on 6L [name]. Standing ~44mins grooming: stable 94% on 6L [place], desat to 89% - resolved c seated rest break    Exercises Exercises: Other exercises Other Exercises Other Exercises: Pt educated re: OT role, DME recs, d/c recs, ECS, falls prevention Other Exercises: Tooth brushing, sit<>stand, sitting/standing balance/tolerance,    Shoulder Instructions      Home Living Family/patient expects to be discharged  to:: Private residence Living Arrangements: Spouse/significant other Available Help at Discharge: Family;Available PRN/intermittently Type of Home: House Home  Access: Ramped entrance     Home Layout: Laundry or work area in basement;Two level;Able to live on main level with bedroom/bathroom ( has basement but does not need to access)     Bathroom Shower/Tub: Teacher, early years/pre: Standard     Home Equipment: None          Prior Functioning/Environment Level of Independence: Independent        Comments: Pt reports independence with transfers and ambulation. Endorses driving and working currently. Pt denies fall history then states that he has fallen out of bed with fiancee stating that is when she hogs the bed.        OT Problem List: Decreased activity tolerance;Cardiopulmonary status limiting activity      OT Treatment/Interventions: Therapeutic exercise;Self-care/ADL training;Energy conservation;DME and/or AE instruction;Therapeutic activities;Balance training;Patient/family education    OT Goals(Current goals can be found in the care plan section) Acute Rehab OT Goals Patient Stated Goal: to go home OT Goal Formulation: With patient/family Time For Goal Achievement: 01/14/20 Potential to Achieve Goals: Good ADL Goals Pt Will Perform Grooming: standing;Independently Pt Will Transfer to Toilet: Independently;ambulating;regular height toilet Additional ADL Goal #1: Pt will Independently verbalize plan to implement x3 ECS  OT Frequency: Min 1X/week    AM-PAC OT "6 Clicks" Daily Activity     Outcome Measure Help from another person eating meals?: None Help from another person taking care of personal grooming?: A Little Help from another person toileting, which includes using toliet, bedpan, or urinal?: A Little Help from another person bathing (including washing, rinsing, drying)?: A Little Help from another person to put on and taking off regular upper body clothing?: None Help from another person to put on and taking off regular lower body clothing?: A Little 6 Click Score: 20   End of Session Equipment  Utilized During Treatment: Oxygen  Activity Tolerance: Patient tolerated treatment well Patient left: in bed;with call bell/phone within reach;with family/visitor present;Other (comment) (RT in room at end of session)  OT Visit Diagnosis: Other abnormalities of gait and mobility (R26.89)                Time: 1442-1500 OT Time Calculation (min): 18 min Charges:  OT General Charges $OT Visit: 1 Visit OT Evaluation $OT Eval Low Complexity: 1 Low OT Treatments $Self Care/Home Management : 8-22 mins  Dessie Coma, M.S. OTR/L  12/31/19, 5:11 PM  ascom [phone]

## 2019-12-31 NOTE — Progress Notes (Signed)
   12/31/19 0106  Assess: MEWS Score  Temp 98.3 F (36.8 C)  BP (!) 144/92  Pulse Rate (!) 112  Resp 20  SpO2 100 %  O2 Device Nasal Cannula  O2 Flow Rate (L/min) 6 L/min  Assess: if the MEWS score is Yellow or Red  Were vital signs taken at a resting state? Yes  Focused Assessment No change from prior assessment  Early Detection of Sepsis Score *See Row Information* Low  MEWS guidelines implemented *See Row Information* Yes  Treat  MEWS Interventions Escalated (See documentation below)  Pain Scale 0-10  Pain Score 0  Take Vital Signs  Increase Vital Sign Frequency  Yellow: Q 2hr X 2 then Q 4hr X 2, if remains yellow, continue Q 4hrs  Escalate  MEWS: Escalate Yellow: discuss with charge nurse/RN and consider discussing with provider and RRT  Notify: Charge Nurse/RN  Name of Charge Nurse/RN Notified Stanton Kidney, RN  Date Charge Nurse/RN Notified 12/31/19  Time Charge Nurse/RN Notified 0115  Notify: Provider  Provider Name/Title Stark Klein, NP  Date Provider Notified 12/31/19  Time Provider Notified 0130  Notification Type Face-to-face  Notification Reason Other (Comment);Change in status (Patient was yellow MEWS in ED prior to arriving on unit.  )  Response No new orders  Date of Provider Response 12/31/19  Time of Provider Response 0130

## 2019-12-31 NOTE — Evaluation (Signed)
Physical Therapy Evaluation Patient Details Name: Scott [name] MRN: [id number] DOB: 1970/08/02 Today's Date: 12/31/2019   History of Present Illness  Maxxon Schwanke is a 49 y/o male who was admitted for COPD exacerbation. CC included worseing SOB x several days. PMH includes hypertension, hyperlipidemia, COPD (on 2 L of oxygen only at night), asthma, GERD, depression, anxiety, Klippel-Feil syndrome, congenital hydrocephalus, dCHF, former smoker,  and CKD stage II.   Clinical Impression  Pt received sitting up in bed with fiancee at bedside and agreeable to participate in PT evaluation. Pt on 6L O2 via nasal cannula upon arrival. Pt performed bed mobility and sit <> stand transfers with supervision for safety and ambulated 100 feet without AD on 3L O2 with close supervision to CGA. Pt deferred further ambulation distance secondary to fatigue, increased work of breathing, and SOB. Verbal cues for pursed lip breathing and pt able to recover to unlabored breathing within several minutes. O2 saturations remained above 94%. Pt left on 3L O2 at end of session. Pt presents with decreased endurance, decreased strength, and decreased balance. Pt not at baseline of functioning therefore recommend continued skilled PT this acute stay to address current deficits. Pt would benefit from HHPT at discharge to improve safety and independence with functional mobility and return to PLOF.  SaO2 on 6L at rest = 99% SaO2 on room air at rest = 88% SaO2 on 2L at rest = 90% SaO2 on 3L at rest = 94% SaO2 on 3L while ambulating = 94-99%    Follow Up Recommendations Home health PT    Equipment Recommendations  None recommended by PT    Recommendations for Other Services       Precautions / Restrictions Precautions Precautions: Fall Restrictions Weight Bearing Restrictions: No      Mobility  Bed Mobility Overal bed mobility: Needs Assistance Bed Mobility: Supine to Sit     Supine to sit: Supervision;HOB  elevated     General bed mobility comments: Supervision for supine to sit from elevated [facility] for safety. Pt required no external physical assistance.  Transfers Overall transfer level: Needs assistance Equipment used: None Transfers: Sit to/from Stand Sit to Stand: Supervision         General transfer comment: Pt able to stand with supervision for safety and for O2 line management.  Ambulation/Gait Ambulation/Gait assistance: Min guard Gait Distance (Feet): 100 Feet Assistive device: None Gait Pattern/deviations: Step-through pattern;Decreased step length - right;Decreased step length - left Gait velocity: 10' in 15"   General Gait Details: Pt with decreased step lengths bilaterally with decreased foot clearance however pt with reciprocal gait pattern. No overt LOB noted during ambulation distance.  Stairs            Wheelchair Mobility    Modified Rankin (Stroke Patients Only)       Balance Overall balance assessment: Needs assistance Sitting-balance support: No upper extremity supported;Feet supported Sitting balance-Leahy Scale: Good Sitting balance - Comments: not overt LOB while sitting EOB without back support   Standing balance support: No upper extremity supported Standing balance-Leahy Scale: Good Standing balance comment: pt with good static balance and required supervision-CGA for dynamic standing balance for safety                             Pertinent Vitals/Pain Pain Assessment: No/denies pain    Home Living Family/patient expects to be discharged to:: Private residence Living Arrangements: Spouse/significant other   Type of  Home: House Home Access: [place]: One level;Other (Comment) (has basement but does not need to access) Home Equipment: None      Prior Function Level of Independence: Independent         Comments: Pt reports independence with transfers and ambulation. Endorses driving and  working currently. Pt denies fall history then states that he has fallen out of bed with fiancee stating that is when she hogs the bed.     Hand Dominance   Dominant Hand: Right    Extremity/Trunk Assessment   Upper Extremity Assessment Upper Extremity Assessment: Generalized weakness (grossly 4- to 4+/5 bilaterally)    Lower Extremity Assessment Lower Extremity Assessment: Generalized weakness (grossly 4- to 4+/5 bilaterally)    Cervical / Trunk Assessment Cervical / Trunk Assessment: Kyphotic  Communication   Communication: No difficulties  Cognition Arousal/Alertness: Awake/alert Behavior During Therapy: WFL for tasks assessed/performed Overall Cognitive Status: Within Functional Limits for tasks assessed                                 General Comments: Pt alert and oriented x 4.      General Comments      Exercises Other Exercises Other Exercises: pt performed LAQ and hip flexion while seated EOB x 5 bilaterally   Assessment/Plan    PT Assessment Patient needs continued PT services  PT Problem List Decreased strength;Decreased activity tolerance;Decreased balance;Decreased mobility;Cardiopulmonary status limiting activity;Obesity       PT Treatment Interventions DME instruction;Gait training;Functional mobility training;Therapeutic activities;Therapeutic exercise;Balance training;Patient/family education    PT Goals (Current goals can be found in the Care Plan section)  Acute Rehab PT Goals Patient Stated Goal: to go home PT Goal Formulation: With patient Time For Goal Achievement: 01/14/20 Potential to Achieve Goals: Good    Frequency Min 2X/week   Barriers to discharge        Co-evaluation               AM-PAC PT "6 Clicks" Mobility  Outcome Measure Help needed turning from your back to your side while in a flat bed without using bedrails?: A Little Help needed moving from lying on your back to sitting on the side of a flat bed  without using bedrails?: A Little Help needed moving to and from a bed to a chair (including a wheelchair)?: A Little Help needed standing up from a chair using your arms (e.g., wheelchair or bedside chair)?: A Little Help needed to walk in hospital room?: A Little Help needed climbing 3-5 steps with a railing? : A Little 6 Click Score: 18    End of Session Equipment Utilized During Treatment: Gait belt;Oxygen Activity Tolerance: Patient limited by fatigue Patient left: in bed;with call bell/phone within reach;with family/visitor present Nurse Communication: Mobility status;Other (comment) (O2 decreased to 3L) PT Visit Diagnosis: Unsteadiness on feet (R26.81);Muscle weakness (generalized) (M62.81);Other abnormalities of gait and mobility (R26.89)    Time: 1962-2297 PT Time Calculation (min) (ACUTE ONLY): 35 min   Charges:              Vale Haven, SPT  Vale Haven 12/31/2019, 4:16 PM

## 2019-12-31 NOTE — ED Notes (Signed)
Report from alf, rn.

## 2019-12-31 NOTE — ED Notes (Signed)
duoneb improving pt resp status, but per pt duoneb does not last long. Per previous RN, albuterol alone does not seem to improve pt resp status.

## 2019-12-31 NOTE — Telephone Encounter (Signed)
I believe he is now inpatient

## 2019-12-31 NOTE — Progress Notes (Addendum)
PROGRESS NOTE    Scott [name]  XMI:[id number] DOB: 30-Jul-1970 DOA: 12/29/2019 PCP: Margo Common, PA   Assessment & Plan:   Principal Problem:   COPD exacerbation ([place]) Active Problems:   Essential hypertension   Chronic diastolic heart failure (HCC)   Acute on chronic respiratory failure with hypoxia (HCC)   Depression   Acute renal failure superimposed on stage 2 chronic kidney disease ([name])   Acute on chronic hypoxic respiratory failure: secondary to COPD. Continue on IV steroids, azithromycin, & bronchodilators. Encourage incentive spirometry. Mucinex prn for cough  COPD exacerbation: continue on IV steroids, azithromycin & bronchodilators. Encourage incentive spirometry. Continue on supplemental oxygen and wean back to baseline   AKI on CKDII: baseline Cr 1.1-1.2. Cr is trending up today. Will continue to monitor   Chronic diastolic heart failure: echo 02/05/2018 showed EF of 55 to 60% with grade 1 diastolic dysfunction. Hold home dose of diuretics  HTN: continue to hold home dose of diuretics. IV hydralazine prn   Depression: severity unknown. Continue on home dose of paroxetine   Morbid obesity: BMI 40.1. Would benefit from weight loss. Complicates overall care and prognosis   DVT prophylaxis: lovenox  Code Status: full  Family Communication: Disposition Plan: depends on PT/OT recs   Status is: Inpatient  Remains inpatient appropriate because:IV treatments appropriate due to intensity of illness or inability to take PO   Dispo: The patient is from: Home              Anticipated d/c is to: Home              Anticipated d/c date is: 3 days              Patient currently is not medically stable to d/c.     Consultants:     Procedures:    Antimicrobials: azithromycin    Subjective: Pt c/o shortness of breath  Objective: Vitals:   12/31/19 0000 12/31/19 0106 12/31/19 0350 12/31/19 0735  BP: 132/73 (!) 144/92 105/69 (!) 147/94  Pulse: (!) 110  (!) 112 (!) 117 (!) 107  Resp: 19 20 17 17   Temp:  98.3 F (36.8 C) 98.3 F (36.8 C) (!) 97.5 F (36.4 C)  TempSrc:  Oral Oral Oral  SpO2: 95% 100% 99% 98%  Weight:  99.6 kg    Height:  5\' 2"  (1.575 m)     No intake or output data in the 24 hours ending 12/31/19 0747 Filed Weights   12/29/19 2044 12/31/19 0106  Weight: 99.8 kg 99.6 kg    Examination:  General exam: Appears calm but uncomfortable. Morbidly obese Respiratory system: diminished breath sounds b/l.  Cardiovascular system: S1 & S2 +. No rubs, gallops or clicks.  Gastrointestinal system: Abdomen is obese, nontender & hypoactive bowel sounds Central nervous system: Alert and oriented. Moves all 4 extremities  Psychiatry: Judgement and insight appear normal. Flat mood and affect     Data Reviewed: I have personally reviewed following labs and imaging studies  CBC: Recent Labs  Lab 12/29/19 2056 12/31/19 0445  WBC 10.8* 15.9*  HGB 15.5 14.9  HCT 46.7 46.8  MCV 94.2 96.3  PLT 345 825   Basic Metabolic Panel: Recent Labs  Lab 12/29/19 2056 12/31/19 0445  NA 137 136  K 4.1 3.6  CL 88* 84*  CO2 36* 37*  GLUCOSE 178* 240*  BUN 25* 47*  CREATININE 1.53* 2.04*  CALCIUM 9.0 9.0   GFR: Estimated Creatinine Clearance: 45 mL/min (  A) (by C-G formula based on SCr of 2.04 mg/dL (H)). Liver Function Tests: No results for input(s): AST, ALT, ALKPHOS, BILITOT, PROT, ALBUMIN in the last 168 hours. No results for input(s): LIPASE, AMYLASE in the last 168 hours. No results for input(s): AMMONIA in the last 168 hours. Coagulation Profile: No results for input(s): INR, PROTIME in the last 168 hours. Cardiac Enzymes: No results for input(s): CKTOTAL, CKMB, CKMBINDEX, TROPONINI in the last 168 hours. BNP (last 3 results) No results for input(s): PROBNP in the last 8760 hours. HbA1C: No results for input(s): HGBA1C in the last 72 hours. CBG: No results for input(s): GLUCAP in the last 168 hours. Lipid Profile: No  results for input(s): CHOL, HDL, LDLCALC, TRIG, CHOLHDL, LDLDIRECT in the last 72 hours. Thyroid Function Tests: No results for input(s): TSH, T4TOTAL, FREET4, T3FREE, THYROIDAB in the last 72 hours. Anemia Panel: No results for input(s): VITAMINB12, FOLATE, FERRITIN, TIBC, IRON, RETICCTPCT in the last 72 hours. Sepsis Labs: Recent Labs  Lab 12/30/19 0122  PROCALCITON <0.10    Recent Results (from the past 240 hour(s))  Respiratory Panel by RT PCR (Flu A&B, Covid) - Nasopharyngeal Swab     Status: None   Collection Time: 12/30/19  3:08 AM   Specimen: Nasopharyngeal Swab  Result Value Ref Range Status   SARS Coronavirus 2 by RT PCR NEGATIVE NEGATIVE Final    Comment: (NOTE) SARS-CoV-2 target nucleic acids are NOT DETECTED.  The SARS-CoV-2 RNA is generally detectable in upper respiratoy specimens during the acute phase of infection. The lowest concentration of SARS-CoV-2 viral copies this assay can detect is 131 copies/mL. A negative result does not preclude SARS-Cov-2 infection and should not be used as the sole basis for treatment or other patient management decisions. A negative result may occur with  improper specimen collection/handling, submission of specimen other than nasopharyngeal swab, presence of viral mutation(s) within the areas targeted by this assay, and inadequate number of viral copies (<131 copies/mL). A negative result must be combined with clinical observations, patient history, and epidemiological information. The expected result is Negative.  Fact Sheet for Patients:  PinkCheek.be  Fact Sheet for Healthcare Providers:  GravelBags.it  This test is no t yet approved or cleared by the [place] FDA and  has been authorized for detection and/or diagnosis of SARS-CoV-2 by FDA under an Emergency Use Authorization (EUA). This EUA will remain  in effect (meaning this test can be used) for the duration  of the COVID-19 declaration under Section 564(b)(1) of the Act, 21 U.S.C. section 360bbb-3(b)(1), unless the authorization is terminated or revoked sooner.     Influenza A by PCR NEGATIVE NEGATIVE Final   Influenza B by PCR NEGATIVE NEGATIVE Final    Comment: (NOTE) The Xpert Xpress SARS-CoV-2/FLU/RSV assay is intended as an aid in  the diagnosis of influenza from Nasopharyngeal swab specimens and  should not be used as a sole basis for treatment. Nasal washings and  aspirates are unacceptable for Xpert Xpress SARS-CoV-2/FLU/RSV  testing.  Fact Sheet for Patients: PinkCheek.be  Fact Sheet for Healthcare Providers: GravelBags.it  This test is not yet approved or cleared by the [place] FDA and  has been authorized for detection and/or diagnosis of SARS-CoV-2 by  FDA under an Emergency Use Authorization (EUA). This EUA will remain  in effect (meaning this test can be used) for the duration of the  Covid-19 declaration under Section 564(b)(1) of the Act, 21  U.S.C. section 360bbb-3(b)(1), unless the authorization is  terminated  or revoked. Performed at [facility], [address] [place], [place] [address]   Culture, blood (routine x 2) Call MD if unable to obtain prior to antibiotics being given     Status: None (Preliminary result)   Collection Time: 12/30/19  7:59 PM   Specimen: BLOOD  Result Value Ref Range Status   Specimen Description BLOOD RIGHT FA  Final   Special Requests   Final    BOTTLES DRAWN AEROBIC AND ANAEROBIC Blood Culture adequate volume   Culture   Final    NO GROWTH < 12 HOURS Performed at [facility], [address] [place], [place] [address]    Report Status PENDING  Incomplete  Culture, blood (routine x 2) Call MD if unable to obtain prior to antibiotics being given     Status: None (Preliminary result)   Collection Time: 12/30/19  7:59 PM   Specimen: BLOOD  Result  Value Ref Range Status   Specimen Description BLOOD RIGHT HAND  Final   Special Requests   Final    BOTTLES DRAWN AEROBIC ONLY Blood Culture results may not be optimal due to an inadequate volume of blood received in culture bottles   Culture   Final    NO GROWTH < 12 HOURS Performed at [facility], [address] [place],  [address]    Report Status PENDING  Incomplete         Radiology Studies: DG Chest 2 View  Result Date: 12/29/2019 CLINICAL DATA:  Chest pain, for 1 week now acutely worsening EXAM: CHEST - 2 VIEW COMPARISON:  Radiograph 12/12/2019 FINDINGS: Severe scoliotic curvature of the spine with exaggerated thoracic kyphosis and posterior fusion hardware which appears unchanged from the comparison exam. Marked chest wall deformity is again noted as well. There are increasing ill-defined opacities in the bilateral lungs which may be accentuated by this chest wall deformity. Mild vascular congestion is noted as well. Cardiomediastinal contours are similar to comparison. No pneumothorax or visible effusion. IMPRESSION: 1. Increasing ill-defined opacities in the bilateral lungs, could reflect atypical infection or edema, among other possibilities. 2. Marked chronic chest wall deformity and curvature of the spine with unchanged appearance of the fusion hardware. Electronically Signed   By: Lovena Le M.D.   On: 12/29/2019 21:13        Scheduled Meds: . acidophilus  1 capsule Oral Daily  . vitamin C  1,000 mg Oral BID  . azithromycin  250 mg Oral Daily  . B-complex with vitamin C  1 tablet Oral Daily  . enoxaparin (LOVENOX) injection  40 mg Subcutaneous BID  . ipratropium-albuterol  3 mL Nebulization Q4H  . magnesium gluconate  500 mg Oral Daily  . methylPREDNISolone (SOLU-MEDROL) injection  60 mg Intravenous Q12H  . omega-3 acid ethyl esters  1 g Oral Daily  . pantoprazole  40 mg Oral Q1200  . PARoxetine  40 mg Oral Daily  . Testosterone  1 application  Topical Daily   Continuous Infusions:   LOS: 1 day    Time spent: 33 mins     Wyvonnia Dusky, MD [facility] Pager [phone]  If 7PM-7AM, please contact night-coverage [email or web address] 12/31/2019, 7:47 AM

## 2019-12-31 NOTE — Progress Notes (Signed)
   12/31/19 1635  Assess: MEWS Score  Temp 98.4 F (36.9 C)  BP 135/78  Pulse Rate (!) 114  Resp 17  SpO2 99 %  O2 Device Nasal Cannula  O2 Flow Rate (L/min) 3 L/min  Assess: MEWS Score  MEWS Temp 0  MEWS Systolic 0  MEWS Pulse 2  MEWS RR 0  MEWS LOC 0  MEWS Score 2  MEWS Score Color Yellow  Assess: if the MEWS score is Yellow or Red  Were vital signs taken at a resting state? Yes  Focused Assessment Change from prior assessment (see assessment flowsheet)  Early Detection of Sepsis Score *See Row Information* Medium  MEWS guidelines implemented *See Row Information* No, previously yellow, continue vital signs every 4 hours  Treat  MEWS Interventions Administered scheduled meds/treatments  Pain Scale 0-10  Pain Score 0  Take Vital Signs  Increase Vital Sign Frequency  Yellow: Q 2hr X 2 then Q 4hr X 2, if remains yellow, continue Q 4hrs  Escalate  MEWS: Escalate Yellow: discuss with charge nurse/RN and consider discussing with provider and RRT  Notify: Charge Nurse/RN  Name of Charge Nurse/RN Notified Teresa, RN  Date Charge Nurse/RN Notified 12/31/19  Time Charge Nurse/RN Notified 1700  Notify: Provider  Provider Name/Title Dr. Jimmye Norman  Date Provider Notified 12/31/19  Time Provider Notified 1700  Notification Type Page  Response No new orders

## 2020-01-01 DIAGNOSIS — K5900 Constipation, unspecified: Secondary | ICD-10-CM | POA: Diagnosis not present

## 2020-01-01 DIAGNOSIS — J9621 Acute and chronic respiratory failure with hypoxia: Secondary | ICD-10-CM | POA: Diagnosis not present

## 2020-01-01 DIAGNOSIS — J441 Chronic obstructive pulmonary disease with (acute) exacerbation: Secondary | ICD-10-CM | POA: Diagnosis not present

## 2020-01-01 LAB — CBC
HCT: 45.9 % (ref 39.0–52.0)
Hemoglobin: 14.3 g/dL (ref 13.0–17.0)
MCH: 30.7 pg (ref 26.0–34.0)
MCHC: 31.2 g/dL (ref 30.0–36.0)
MCV: 98.5 fL (ref 80.0–100.0)
Platelets: 346 10*3/uL (ref 150–400)
RBC: 4.66 MIL/uL (ref 4.22–5.81)
RDW: 14 % (ref 11.5–15.5)
WBC: 15.4 10*3/uL — ABNORMAL HIGH (ref 4.0–10.5)
nRBC: 0 % (ref 0.0–0.2)

## 2020-01-01 LAB — BASIC METABOLIC PANEL
Anion gap: 14 (ref 5–15)
BUN: 47 mg/dL — ABNORMAL HIGH (ref 6–20)
CO2: 37 mmol/L — ABNORMAL HIGH (ref 22–32)
Calcium: 9.2 mg/dL (ref 8.9–10.3)
Chloride: 86 mmol/L — ABNORMAL LOW (ref 98–111)
Creatinine, Ser: 1.61 mg/dL — ABNORMAL HIGH (ref 0.61–1.24)
GFR calc Af Amer: 57 mL/min — ABNORMAL LOW (ref >60)
GFR calc non Af Amer: 49 mL/min — ABNORMAL LOW (ref >60)
Glucose, Bld: 255 mg/dL — ABNORMAL HIGH (ref 70–99)
Potassium: 3.5 mmol/L (ref 3.5–5.1)
Sodium: 137 mmol/L (ref 135–145)

## 2020-01-01 MED ORDER — BUDESONIDE 0.5 MG/2ML IN SUSP
1.0000 mg | Freq: Two times a day (BID) | RESPIRATORY_TRACT | Status: DC
  Administered 2020-01-01 – 2020-01-06 (×11): 1 mg via RESPIRATORY_TRACT
  Filled 2020-01-01 (×14): qty 4

## 2020-01-01 MED ORDER — METOPROLOL TARTRATE 5 MG/5ML IV SOLN: 5.0000 mg | Freq: Four times a day (QID) | INTRAVENOUS | Status: DC | PRN

## 2020-01-01 NOTE — Progress Notes (Signed)
Physical Therapy Treatment Patient Details Name: Scott [name] MRN: [id number] DOB: Feb 26, 1971 Today's Date: 01/01/2020    History of Present Illness Scott [name] is a 49 y/o male who was admitted for COPD exacerbation. CC included worseing SOB x several days. PMH includes hypertension, hyperlipidemia, COPD (on 2 L of oxygen only at night), asthma, GERD, depression, anxiety, Klippel-Feil syndrome, congenital hydrocephalus, dCHF, former smoker,  and CKD stage II.     PT Comments    Pt presents in fowler's position on arrival to room and is agreeable to PT. Pt denies any pain but notes SOB is worse today. Pt performed bed mobility and sit <> stand transfers with supervision for safety and ambulated 220 feet without AD on 3L O2 with close supervision to CGA. Pt needed rest break 3/4ths of the way through  due to SOB and fatigue, but able to continue walking without sitting down. Once in room patient sits EOB to perform therapeutic exercises. He performed 5TSTS in 15.61 seconds, indicating decreased power, endurance, and increase risk for falls. Pt will benefit from skilled PT services to improve safety and independence with functional mobility and return to PLOF.  O2 sat levels: Resting: 96% on 3.5L; HR: 118; 96% on 3L, HR: 120 Ambulation/Exercise: 93-95% on 3L; HR: 125-130   Follow Up Recommendations  Home health PT     Equipment Recommendations  None recommended by PT    Recommendations for Other Services       Precautions / Restrictions Precautions Precautions: Fall Restrictions Weight Bearing Restrictions: No    Mobility  Bed Mobility Overal bed mobility: Modified Independent Bed Mobility: Supine to Sit     Supine to sit: Supervision;HOB elevated     General bed mobility comments: Supervision for supine to sit from elevated [facility] for safety. Pt required no external physical assistance.  Transfers Overall transfer level: Modified independent Equipment used: None Transfers: Sit  to/from Stand Sit to Stand: Supervision         General transfer comment: MOD I for O2 and lines mgmt   Ambulation/Gait Ambulation/Gait assistance: Min guard Gait Distance (Feet): 220 Feet Assistive device: None Gait Pattern/deviations: Step-through pattern;Decreased step length - right;Decreased step length - left     General Gait Details: Pt with decreased step lengths bilaterally with decreased foot clearance however pt with reciprocal gait pattern. 3/4 through walk, patient needed a break and used rail for support.   Stairs             Wheelchair Mobility    Modified Rankin (Stroke Patients Only)       Balance Overall balance assessment: Needs assistance Sitting-balance support: No upper extremity supported;Feet supported Sitting balance-Leahy Scale: Good Sitting balance - Comments: not overt LOB while sitting EOB without back support   Standing balance support: No upper extremity supported;During functional activity Standing balance-Leahy Scale: Good Standing balance comment: pt with good static balance and required supervision-CGA for dynamic standing balance for safety                            Cognition Arousal/Alertness: Awake/alert Behavior During Therapy: WFL for tasks assessed/performed Overall Cognitive Status: Within Functional Limits for tasks assessed                                 General Comments: Pt alert and oriented x 4.      Exercises General Exercises -  Upper Extremity Elbow Flexion: Strengthening;10 reps;Both;Seated Elbow Extension: Strengthening;Both;10 reps;Seated General Exercises - Lower Extremity Ankle Circles/Pumps: Strengthening;Both;10 reps;Seated Long Arc Quad: AROM;Both;10 reps;Seated Hip Flexion/Marching: AROM;Both;10 reps;Seated Other Exercises Other Exercises: Pt educated in energy conservation strategies to maximize indep/safety while minimizing SOB/over exertion including pursed lip  breathing, activity pacing, work simplification, and AE/DME; handout provided to support recall and carryover Other Exercises: Pt performed scapular retractions x 10; hip abduction/adduction clam shells with manual resistance Other Exercises: 5TSTS: 15.61 seconds, indicating decreased power, endurance, and increase risk for falls    General Comments        Pertinent Vitals/Pain Pain Assessment: No/denies pain    Home Living                      Prior Function            PT Goals (current goals can now be found in the care plan section) Acute Rehab PT Goals Patient Stated Goal: to go home PT Goal Formulation: With patient Time For Goal Achievement: 01/14/20 Potential to Achieve Goals: Good Progress towards PT goals: Progressing toward goals    Frequency    Min 2X/week      PT Plan Current plan remains appropriate    Co-evaluation              AM-PAC PT "6 Clicks" Mobility   Outcome Measure  Help needed turning from your back to your side while in a flat bed without using bedrails?: A Little Help needed moving from lying on your back to sitting on the side of a flat bed without using bedrails?: A Little Help needed moving to and from a bed to a chair (including a wheelchair)?: A Little Help needed standing up from a chair using your arms (e.g., wheelchair or bedside chair)?: A Little Help needed to walk in hospital room?: A Little Help needed climbing 3-5 steps with a railing? : A Little 6 Click Score: 18    End of Session Equipment Utilized During Treatment: Gait belt;Oxygen Activity Tolerance: Patient limited by fatigue Patient left: in bed;with call bell/phone within reach Nurse Communication: Mobility status PT Visit Diagnosis: Unsteadiness on feet (R26.81);Muscle weakness (generalized) (M62.81);Other abnormalities of gait and mobility (R26.89)     Time: 9826-4158 PT Time Calculation (min) (ACUTE ONLY): 24 min  Charges:  $Gait Training:  8-22 mins $Therapeutic Exercise: 8-22 mins                       Noemi Chapel, SPT Bernita Raisin 01/01/2020, 4:28 PM

## 2020-01-01 NOTE — Progress Notes (Signed)
   12/31/19 2038  Assess: MEWS Score  Temp 98.3 F (36.8 C)  BP 139/77  Pulse Rate (!) 112  Resp 20  Level of Consciousness Alert  SpO2 96 %  O2 Device Nasal Cannula  O2 Flow Rate (L/min) 3 L/min  Assess: MEWS Score  MEWS Temp 0  MEWS Systolic 0  MEWS Pulse 2  MEWS RR 0  MEWS LOC 0  MEWS Score 2  MEWS Score Color Yellow  Assess: if the MEWS score is Yellow or Red  Were vital signs taken at a resting state? Yes  Focused Assessment No change from prior assessment  Early Detection of Sepsis Score *See Row Information* Low  MEWS guidelines implemented *See Row Information* No, previously yellow, continue vital signs every 4 hours  Treat  Pain Scale 0-10  Pain Score 0

## 2020-01-01 NOTE — Progress Notes (Signed)
Pts HR elevated, initiated MEWS 2, MD notified.

## 2020-01-01 NOTE — Progress Notes (Signed)
Occupational Therapy Treatment Patient Details Name: Scott [name] MRN: [id number] DOB: 07-Dec-1970 Today's Date: 01/01/2020    History of present illness Scott [name] is a 49 y/o male who was admitted for COPD exacerbation. CC included worseing SOB x several days. PMH includes hypertension, hyperlipidemia, COPD (on 2 L of oxygen only at night), asthma, GERD, depression, anxiety, Klippel-Feil syndrome, congenital hydrocephalus, dCHF, former smoker,  and CKD stage II.    OT comments  Pt seen for OT tx this date. Pt sitting EOB upon OT's arrival, agreeable to therapy. Resting HR 120, O2 sats 97% on O2. Pt denied pain/SOB. Pt educated in energy conservation strategies to maximize indep/safety while minimizing SOB/over exertion including pursed lip breathing, activity pacing, work simplification, and AE/DME; handout provided to support recall and carryover. Pt verbalized understanding. Agreeable to trialing AE for LB ADL next session. Continue to recommend Lynn.   Follow Up Recommendations  Home health OT    Equipment Recommendations  Other (comment);Tub/shower bench (reacher, sock aide, LH sponge, LH shoe horn)    Recommendations for Other Services      Precautions / Restrictions Precautions Precautions: Fall Restrictions Weight Bearing Restrictions: No       Mobility Bed Mobility                  Transfers                      Balance Overall balance assessment: Needs assistance Sitting-balance support: No upper extremity supported;Feet supported Sitting balance-Leahy Scale: Good                                     ADL either performed or assessed with clinical judgement   ADL Overall ADL's : Needs assistance/impaired                                       General ADL Comments: gets SOB with limited exertion during ADL tasks     Vision       Perception     Praxis      Cognition Arousal/Alertness:  Awake/alert Behavior During Therapy: WFL for tasks assessed/performed Overall Cognitive Status: Within Functional Limits for tasks assessed                                          Exercises Other Exercises Other Exercises: Pt educated in energy conservation strategies to maximize indep/safety while minimizing SOB/over exertion including pursed lip breathing, activity pacing, work simplification, and AE/DME; handout provided to support recall and carryover   Shoulder Instructions       General Comments      Pertinent Vitals/ Pain          Home Living                                          Prior Functioning/Environment              Frequency  Min 1X/week        Progress Toward Goals  OT Goals(current goals can now be found in the care plan section)  Progress towards OT goals: Progressing toward goals  Acute Rehab OT Goals Patient Stated Goal: to go home OT Goal Formulation: With patient/family Time For Goal Achievement: 01/14/20 Potential to Achieve Goals: Good  Plan Discharge plan remains appropriate;Frequency remains appropriate    Co-evaluation                 AM-PAC OT "6 Clicks" Daily Activity     Outcome Measure   Help from another person eating meals?: None Help from another person taking care of personal grooming?: A Little Help from another person toileting, which includes using toliet, bedpan, or urinal?: A Little Help from another person bathing (including washing, rinsing, drying)?: A Little Help from another person to put on and taking off regular upper body clothing?: None Help from another person to put on and taking off regular lower body clothing?: A Little 6 Click Score: 20    End of Session Equipment Utilized During Treatment: Oxygen  OT Visit Diagnosis: Other abnormalities of gait and mobility (R26.89)   Activity Tolerance Patient tolerated treatment well   Patient Left in bed;with call  bell/phone within reach   Nurse Communication          Time: 4835-0757 OT Time Calculation (min): 25 min  Charges: OT General Charges $OT Visit: 1 Visit OT Treatments $Self Care/Home Management : 23-37 mins  Jeni Salles, MPH, MS, OTR/L ascom [phone] 01/01/20, 3:24 PM

## 2020-01-01 NOTE — Progress Notes (Signed)
   01/01/20 2145  Assess: MEWS Score  Temp 98.6 F (37 C)  BP 138/83  Pulse Rate (!) 117  Resp 20  SpO2 99 %  O2 Device Nasal Cannula  O2 Flow Rate (L/min) 3 L/min  Assess: MEWS Score  MEWS Temp 0  MEWS Systolic 0  MEWS Pulse 2  MEWS RR 0  MEWS LOC 0  MEWS Score 2  MEWS Score Color Yellow  Assess: if the MEWS score is Yellow or Red  Were vital signs taken at a resting state? Yes  Focused Assessment No change from prior assessment  Early Detection of Sepsis Score *See Row Information* Low  MEWS guidelines implemented *See Row Information* No, vital signs rechecked

## 2020-01-01 NOTE — Progress Notes (Signed)
   01/01/20 1103  Assess: MEWS Score  Temp 98 F (36.7 C)  BP 119/68  Pulse Rate (!) 117  Resp 20  SpO2 97 %  O2 Device Nasal Cannula  O2 Flow Rate (L/min) 2 L/min  Assess: MEWS Score  MEWS Temp 0  MEWS Systolic 0  MEWS Pulse 2  MEWS RR 0  MEWS LOC 0  MEWS Score 2  MEWS Score Color Yellow  Assess: if the MEWS score is Yellow or Red  Were vital signs taken at a resting state? Yes  Focused Assessment No change from prior assessment  Early Detection of Sepsis Score *See Row Information* Low  MEWS guidelines implemented *See Row Information* Yes  Treat  MEWS Interventions Escalated (See documentation below)  Take Vital Signs  Increase Vital Sign Frequency  Yellow: Q 2hr X 2 then Q 4hr X 2, if remains yellow, continue Q 4hrs  Escalate  MEWS: Escalate Yellow: discuss with charge nurse/RN and consider discussing with provider and RRT  Notify: Charge Nurse/RN  Name of Charge Nurse/RN Notified Teresa, RN  Date Charge Nurse/RN Notified 01/01/20  Time Charge Nurse/RN Notified 1105  Notify: Provider  Provider Name/Title Dr Jimmye Norman  Date Provider Notified 01/01/20  Time Provider Notified 1105  Notification Type Page  Notification Reason Other (Comment) (HR)  Response See new orders  Date of Provider Response 01/01/20  Time of Provider Response 1103  Document  Patient Outcome Other (Comment) (continue to monitor)

## 2020-01-01 NOTE — Progress Notes (Signed)
PROGRESS NOTE    Scott [name]  TKZ:[id number] DOB: 1970/08/22 DOA: 12/29/2019 PCP: Margo Common, PA   Assessment & Plan:   Principal Problem:   COPD exacerbation ([place]) Active Problems:   Essential hypertension   Chronic diastolic heart failure ([place])   Acute on chronic respiratory failure with hypoxia ([facility])   Depression   Acute renal failure superimposed on stage 2 chronic kidney disease ([place])   Acute on chronic hypoxic respiratory failure: slightly improved. Secondary to COPD. Continue on IV steroids, azithromycin, & bronchodilators. Encourage incentive spirometry. Mucinex prn for cough  COPD exacerbation: respiratory status slight improved from day prior. Continue on IV steroids, azithromycin & bronchodilators. Encourage incentive spirometry. Continue on supplemental oxygen and wean back to baseline   AKI on CKDII: baseline Cr is 1.1-1.2. Cr is trending down slightly today. Will continue to monitor   Chronic diastolic heart failure: echo 02/05/2018 showed EF of 55 to 60% with grade 1 diastolic dysfunction. Hold home dose of diuretics  Constipation: w/ large stool burden as per XR abd. Colace, miralax & senna ordered.   Leukocytosis: likely secondary to steroid use. Will continue to monitor   HTN: continue to hold home dose of diuretics. IV hydralazine prn   Depression: severity unknown. Continue on home dose of paroxetine   Morbid obesity: BMI 40.1. Would benefit from weight loss. Complicates overall care and prognosis   DVT prophylaxis: lovenox  Code Status: full  Family Communication: Disposition Plan: will likely d/c home w/ home health   Status is: Inpatient  Remains inpatient appropriate because:IV treatments appropriate due to intensity of illness or inability to take PO   Dispo: The patient is from: Home              Anticipated d/c is to: Home w/ home health               Anticipated d/c date is: 1-2 days               Patient currently is not  medically stable to d/c.     Consultants:     Procedures:    Antimicrobials: azithromycin    Subjective: Pt c/o constipation and shortness of breath   Objective: Vitals:   12/31/19 2344 01/01/20 0014 01/01/20 0439 01/01/20 0704  BP: (!) 158/95   (!) 122/97  Pulse: (!) 110   (!) 107  Resp: 18   17  Temp: 97.9 F (36.6 C)   97.8 F (36.6 C)  TempSrc: Oral   Oral  SpO2: 98% 97% 97% 94%  Weight:      Height:       No intake or output data in the 24 hours ending 01/01/20 0738 Filed Weights   12/29/19 2044 12/31/19 0106  Weight: 99.8 kg 99.6 kg    Examination:  General exam: Appears calm but uncomfortable. Morbidly obese Respiratory system: decreased breath sounds b/l. Wheezes b/l  Cardiovascular system: S1& S2+. No gallops or rubs Gastrointestinal system: Abd is distended, firm, non-tender, & hypoactive bowel sounds  Central nervous system: Alert and oriented. Moves all 4 extremities  Psychiatry: Judgement and insight appear normal. Flat mood and affect     Data Reviewed: I have personally reviewed following labs and imaging studies  CBC: Recent Labs  Lab 12/29/19 2056 12/31/19 0445 01/01/20 0404  WBC 10.8* 15.9* 15.4*  HGB 15.5 14.9 14.3  HCT 46.7 46.8 45.9  MCV 94.2 96.3 98.5  PLT 345 363 573   Basic Metabolic Panel: Recent  Labs  Lab 12/29/19 2056 12/31/19 0445 01/01/20 0404  NA 137 136 137  K 4.1 3.6 3.5  CL 88* 84* 86*  CO2 36* 37* 37*  GLUCOSE 178* 240* 255*  BUN 25* 47* 47*  CREATININE 1.53* 2.04* 1.61*  CALCIUM 9.0 9.0 9.2   GFR: Estimated Creatinine Clearance: 57 mL/min (A) (by C-G formula based on SCr of 1.61 mg/dL (H)). Liver Function Tests: No results for input(s): AST, ALT, ALKPHOS, BILITOT, PROT, ALBUMIN in the last 168 hours. No results for input(s): LIPASE, AMYLASE in the last 168 hours. No results for input(s): AMMONIA in the last 168 hours. Coagulation Profile: No results for input(s): INR, PROTIME in the last 168  hours. Cardiac Enzymes: No results for input(s): CKTOTAL, CKMB, CKMBINDEX, TROPONINI in the last 168 hours. BNP (last 3 results) No results for input(s): PROBNP in the last 8760 hours. HbA1C: No results for input(s): HGBA1C in the last 72 hours. CBG: No results for input(s): GLUCAP in the last 168 hours. Lipid Profile: No results for input(s): CHOL, HDL, LDLCALC, TRIG, CHOLHDL, LDLDIRECT in the last 72 hours. Thyroid Function Tests: No results for input(s): TSH, T4TOTAL, FREET4, T3FREE, THYROIDAB in the last 72 hours. Anemia Panel: No results for input(s): VITAMINB12, FOLATE, FERRITIN, TIBC, IRON, RETICCTPCT in the last 72 hours. Sepsis Labs: Recent Labs  Lab 12/30/19 0122  PROCALCITON <0.10    Recent Results (from the past 240 hour(s))  Respiratory Panel by RT PCR (Flu A&B, Covid) - Nasopharyngeal Swab     Status: None   Collection Time: 12/30/19  3:08 AM   Specimen: Nasopharyngeal Swab  Result Value Ref Range Status   SARS Coronavirus 2 by RT PCR NEGATIVE NEGATIVE Final    Comment: (NOTE) SARS-CoV-2 target nucleic acids are NOT DETECTED.  The SARS-CoV-2 RNA is generally detectable in upper respiratoy specimens during the acute phase of infection. The lowest concentration of SARS-CoV-2 viral copies this assay can detect is 131 copies/mL. A negative result does not preclude SARS-Cov-2 infection and should not be used as the sole basis for treatment or other patient management decisions. A negative result may occur with  improper specimen collection/handling, submission of specimen other than nasopharyngeal swab, presence of viral mutation(s) within the areas targeted by this assay, and inadequate number of viral copies (<131 copies/mL). A negative result must be combined with clinical observations, patient history, and epidemiological information. The expected result is Negative.  Fact Sheet for Patients:  PinkCheek.be  Fact Sheet for  Healthcare Providers:  GravelBags.it  This test is no t yet approved or cleared by the [place] FDA and  has been authorized for detection and/or diagnosis of SARS-CoV-2 by FDA under an Emergency Use Authorization (EUA). This EUA will remain  in effect (meaning this test can be used) for the duration of the COVID-19 declaration under Section 564(b)(1) of the Act, 21 U.S.C. section 360bbb-3(b)(1), unless the authorization is terminated or revoked sooner.     Influenza A by PCR NEGATIVE NEGATIVE Final   Influenza B by PCR NEGATIVE NEGATIVE Final    Comment: (NOTE) The Xpert Xpress SARS-CoV-2/FLU/RSV assay is intended as an aid in  the diagnosis of influenza from Nasopharyngeal swab specimens and  should not be used as a sole basis for treatment. Nasal washings and  aspirates are unacceptable for Xpert Xpress SARS-CoV-2/FLU/RSV  testing.  Fact Sheet for Patients: PinkCheek.be  Fact Sheet for Healthcare Providers: GravelBags.it  This test is not yet approved or cleared by the [place] and  has been authorized for detection and/or diagnosis of SARS-CoV-2 by  FDA under an Emergency Use Authorization (EUA). This EUA will remain  in effect (meaning this test can be used) for the duration of the  Covid-19 declaration under Section 564(b)(1) of the Act, 21  U.S.C. section 360bbb-3(b)(1), unless the authorization is  terminated or revoked. Performed at [facility], [address] [place], [place] [address]   Culture, blood (routine x 2) Call MD if unable to obtain prior to antibiotics being given     Status: None (Preliminary result)   Collection Time: 12/30/19  7:59 PM   Specimen: BLOOD  Result Value Ref Range Status   Specimen Description BLOOD RIGHT FA  Final   Special Requests   Final    BOTTLES DRAWN AEROBIC AND ANAEROBIC Blood Culture adequate volume   Culture   Final     NO GROWTH 2 DAYS Performed at [facility], [address] [place], [place] [address]    Report Status PENDING  Incomplete  Culture, blood (routine x 2) Call MD if unable to obtain prior to antibiotics being given     Status: None (Preliminary result)   Collection Time: 12/30/19  7:59 PM   Specimen: BLOOD  Result Value Ref Range Status   Specimen Description BLOOD RIGHT HAND  Final   Special Requests   Final    BOTTLES DRAWN AEROBIC ONLY Blood Culture results may not be optimal due to an inadequate volume of blood received in culture bottles   Culture   Final    NO GROWTH 2 DAYS Performed at [facility], [address] [place], [place] [address]    Report Status PENDING  Incomplete         Radiology Studies: DG Abd Portable 1V  Result Date: 12/31/2019 CLINICAL DATA:  Abdominal distension. EXAM: PORTABLE ABDOMEN - 1 VIEW COMPARISON:  November 30, 2019. FINDINGS: No abnormal bowel dilatation is noted. Large amount of stool is seen throughout the colon. No radio-opaque calculi or other significant radiographic abnormality are seen. IMPRESSION: Large stool burden is noted. No evidence of bowel obstruction or ileus. Electronically Signed   By: Marijo Conception M.D.   On: 12/31/2019 18:54        Scheduled Meds: . acidophilus  1 capsule Oral Daily  . vitamin C  1,000 mg Oral BID  . B-complex with vitamin C  1 tablet Oral Daily  . docusate sodium  200 mg Oral Daily  . enoxaparin (LOVENOX) injection  40 mg Subcutaneous BID  . ipratropium-albuterol  3 mL Nebulization Q4H  . LORazepam  1 mg Oral Once  . magnesium gluconate  500 mg Oral Daily  . methylPREDNISolone (SOLU-MEDROL) injection  60 mg Intravenous Q12H  . omega-3 acid ethyl esters  1 g Oral Daily  . pantoprazole  40 mg Oral Q1200  . PARoxetine  40 mg Oral QHS  . polyethylene glycol  17 g Oral BID   Continuous Infusions: . azithromycin       LOS: 2 days    Time spent: 31 mins     Wyvonnia Dusky, MD [facility] Pager [phone]  If 7PM-7AM, please contact night-coverage [email or web address] 01/01/2020, 7:38 AM

## 2020-01-02 DIAGNOSIS — J441 Chronic obstructive pulmonary disease with (acute) exacerbation: Secondary | ICD-10-CM | POA: Diagnosis not present

## 2020-01-02 LAB — BASIC METABOLIC PANEL
Anion gap: 10 (ref 5–15)
BUN: 37 mg/dL — ABNORMAL HIGH (ref 6–20)
CO2: 38 mmol/L — ABNORMAL HIGH (ref 22–32)
Calcium: 9 mg/dL (ref 8.9–10.3)
Chloride: 88 mmol/L — ABNORMAL LOW (ref 98–111)
Creatinine, Ser: 1.34 mg/dL — ABNORMAL HIGH (ref 0.61–1.24)
GFR calc Af Amer: >60 mL/min (ref >60)
GFR calc non Af Amer: >60 mL/min (ref >60)
Glucose, Bld: 207 mg/dL — ABNORMAL HIGH (ref 70–99)
Potassium: 3.7 mmol/L (ref 3.5–5.1)
Sodium: 136 mmol/L (ref 135–145)

## 2020-01-02 LAB — CBC
HCT: 43.5 % (ref 39.0–52.0)
Hemoglobin: 14.2 g/dL (ref 13.0–17.0)
MCH: 31.1 pg (ref 26.0–34.0)
MCHC: 32.6 g/dL (ref 30.0–36.0)
MCV: 95.2 fL (ref 80.0–100.0)
Platelets: 312 10*3/uL (ref 150–400)
RBC: 4.57 MIL/uL (ref 4.22–5.81)
RDW: 14.2 % (ref 11.5–15.5)
WBC: 11.6 10*3/uL — ABNORMAL HIGH (ref 4.0–10.5)
nRBC: 0 % (ref 0.0–0.2)

## 2020-01-02 LAB — BRAIN NATRIURETIC PEPTIDE: B Natriuretic Peptide: 111.2 pg/mL — ABNORMAL HIGH (ref 0.0–100.0)

## 2020-01-02 MED ORDER — FUROSEMIDE 10 MG/ML IJ SOLN
20.0000 mg | Freq: Two times a day (BID) | INTRAMUSCULAR | Status: DC
  Administered 2020-01-03: 20 mg via INTRAVENOUS
  Filled 2020-01-02: qty 4

## 2020-01-02 MED ORDER — AZITHROMYCIN 250 MG PO TABS
250.0000 mg | ORAL_TABLET | Freq: Once | ORAL | Status: AC
  Administered 2020-01-02: 250 mg via ORAL
  Filled 2020-01-02: qty 1

## 2020-01-02 NOTE — Progress Notes (Signed)
PROGRESS NOTE    Scott [name]  ONG:[id number] DOB: [birth year] DOA: 12/29/2019 PCP: Margo Common, PA   Assessment & Plan:   Principal Problem:   COPD exacerbation ([place]) Active Problems:   Essential hypertension   Chronic diastolic heart failure ([place])   Acute on chronic respiratory failure with hypoxia (HCC)   Depression   Acute renal failure superimposed on stage 2 chronic kidney disease ([place])   Acute on chronic hypoxic respiratory failure:  Mildly improved.  Still quite sob. Will continue with iv steroid, abx, and bronchodilators. Continue mucinex   COPD exacerbation: still quite symptomatic. Not at baseline yet Will continue with iv steroids, abx, and bronchodilators.     AKI on CKDII: baseline Cr is 1.1-1.2.  Improving , will continue monitoring Avoid nephrotoxic medications     Acute on Chronic diastolic heart failure: echo 02/05/2018 showed EF of 55 to 60% with grade 1 diastolic dysfunction.  Appears volume overloaded on exam. bnp ordered and elevated.possibly 2/2 steroids. Lasix were on home Will give lasix 20mg  iv x1  Constipation: w/ large stool burden as per XR abd.  Continue bowel regimen with colace, miralax, and senna.  Colace, miralax & senna ordered.   Leukocytosis: likely secondary to steroid use.  Improving . Continue to monitor    HTN: continue to hold home dose of diuretics. IV hydralazine prn   Depression: severity unknown. Continue on home dose of paroxetine   Morbid obesity: BMI 40.1. Would benefit from weight loss. Complicates overall care and prognosis   DVT prophylaxis: lovenox  Code Status: full  Family Communication:none at bedside  Status is: Inpatient  Remains inpatient appropriate because:IV treatments appropriate due to intensity of illness or inability to take PO   Dispo: The patient is from: Home              Anticipated d/c is to: Home w/ home health               Anticipated d/c date is: 2 days               Patient currently is not medically stable to d/c.still symptomatic, volume overloaded, needs iv treatmet     Consultants:     Procedures:    Antimicrobials: azithromycin    Subjective: C/o sob and doe. No cp.   Objective: Vitals:   01/02/20 1243 01/02/20 1519 01/02/20 1554 01/02/20 2018  BP:  111/61  136/89  Pulse: (!) 109 (!) 105  (!) 107  Resp:  20  18  Temp:  98 F (36.7 C)  98.1 F (36.7 C)  TempSrc:  Oral  Oral  SpO2: 93% 95% 94% 97%  Weight:      Height:        Intake/Output Summary (Last 24 hours) at 01/02/2020 2057 Last data filed at 01/02/2020 1856 Gross per 24 hour  Intake 1320 ml  Output --  Net 1320 ml   Filed Weights   12/29/19 2044 12/31/19 0106  Weight: 99.8 kg 99.6 kg    Examination: Appears calm, but mildly sob with talking +rales b/l no wheezing Regular s1/s2 no m/r/g Soft benign +bs +b/l LE edema    Data Reviewed: I have personally reviewed following labs and imaging studies  CBC: Recent Labs  Lab 12/29/19 2056 12/31/19 0445 01/01/20 0404 01/02/20 0553  WBC 10.8* 15.9* 15.4* 11.6*  HGB 15.5 14.9 14.3 14.2  HCT 46.7 46.8 45.9 43.5  MCV 94.2 96.3 98.5 95.2  PLT 345 363 346 312  Basic Metabolic Panel: Recent Labs  Lab 12/29/19 2056 12/31/19 0445 01/01/20 0404 01/02/20 0553  NA 137 136 137 136  K 4.1 3.6 3.5 3.7  CL 88* 84* 86* 88*  CO2 36* 37* 37* 38*  GLUCOSE 178* 240* 255* 207*  BUN 25* 47* 47* 37*  CREATININE 1.53* 2.04* 1.61* 1.34*  CALCIUM 9.0 9.0 9.2 9.0   GFR: Estimated Creatinine Clearance: 68.5 mL/min (A) (by C-G formula based on SCr of 1.34 mg/dL (H)). Liver Function Tests: No results for input(s): AST, ALT, ALKPHOS, BILITOT, PROT, ALBUMIN in the last 168 hours. No results for input(s): LIPASE, AMYLASE in the last 168 hours. No results for input(s): AMMONIA in the last 168 hours. Coagulation Profile: No results for input(s): INR, PROTIME in the last 168 hours. Cardiac Enzymes: No results for  input(s): CKTOTAL, CKMB, CKMBINDEX, TROPONINI in the last 168 hours. BNP (last 3 results) No results for input(s): PROBNP in the last 8760 hours. HbA1C: No results for input(s): HGBA1C in the last 72 hours. CBG: No results for input(s): GLUCAP in the last 168 hours. Lipid Profile: No results for input(s): CHOL, HDL, LDLCALC, TRIG, CHOLHDL, LDLDIRECT in the last 72 hours. Thyroid Function Tests: No results for input(s): TSH, T4TOTAL, FREET4, T3FREE, THYROIDAB in the last 72 hours. Anemia Panel: No results for input(s): VITAMINB12, FOLATE, FERRITIN, TIBC, IRON, RETICCTPCT in the last 72 hours. Sepsis Labs: Recent Labs  Lab 12/30/19 0122  PROCALCITON <0.10    Recent Results (from the past 240 hour(s))  Respiratory Panel by RT PCR (Flu A&B, Covid) - Nasopharyngeal Swab     Status: None   Collection Time: 12/30/19  3:08 AM   Specimen: Nasopharyngeal Swab  Result Value Ref Range Status   SARS Coronavirus 2 by RT PCR NEGATIVE NEGATIVE Final    Comment: (NOTE) SARS-CoV-2 target nucleic acids are NOT DETECTED.  The SARS-CoV-2 RNA is generally detectable in upper respiratoy specimens during the acute phase of infection. The lowest concentration of SARS-CoV-2 viral copies this assay can detect is 131 copies/mL. A negative result does not preclude SARS-Cov-2 infection and should not be used as the sole basis for treatment or other patient management decisions. A negative result may occur with  improper specimen collection/handling, submission of specimen other than nasopharyngeal swab, presence of viral mutation(s) within the areas targeted by this assay, and inadequate number of viral copies (<131 copies/mL). A negative result must be combined with clinical observations, patient history, and epidemiological information. The expected result is Negative.  Fact Sheet for Patients:  PinkCheek.be  Fact Sheet for Healthcare Providers:    GravelBags.it  This test is no t yet approved or cleared by the [place] FDA and  has been authorized for detection and/or diagnosis of SARS-CoV-2 by FDA under an Emergency Use Authorization (EUA). This EUA will remain  in effect (meaning this test can be used) for the duration of the COVID-19 declaration under Section 564(b)(1) of the Act, 21 U.S.C. section 360bbb-3(b)(1), unless the authorization is terminated or revoked sooner.     Influenza A by PCR NEGATIVE NEGATIVE Final   Influenza B by PCR NEGATIVE NEGATIVE Final    Comment: (NOTE) The Xpert Xpress SARS-CoV-2/FLU/RSV assay is intended as an aid in  the diagnosis of influenza from Nasopharyngeal swab specimens and  should not be used as a sole basis for treatment. Nasal washings and  aspirates are unacceptable for Xpert Xpress SARS-CoV-2/FLU/RSV  testing.  Fact Sheet for Patients: PinkCheek.be  Fact Sheet for Healthcare Providers: GravelBags.it  This test is not yet approved or cleared by the [place] and  has been authorized for detection and/or diagnosis of SARS-CoV-2 by  FDA under an Emergency Use Authorization (EUA). This EUA will remain  in effect (meaning this test can be used) for the duration of the  Covid-19 declaration under Section 564(b)(1) of the Act, 21  U.S.C. section 360bbb-3(b)(1), unless the authorization is  terminated or revoked. Performed at [facility], [address] [place], [place] [address]   Culture, blood (routine x 2) Call MD if unable to obtain prior to antibiotics being given     Status: None (Preliminary result)   Collection Time: 12/30/19  7:59 PM   Specimen: BLOOD  Result Value Ref Range Status   Specimen Description BLOOD RIGHT FA  Final   Special Requests   Final    BOTTLES DRAWN AEROBIC AND ANAEROBIC Blood Culture adequate volume   Culture   Final    NO GROWTH 3  DAYS Performed at [facility], [address] [place], [place] [address]    Report Status PENDING  Incomplete  Culture, blood (routine x 2) Call MD if unable to obtain prior to antibiotics being given     Status: None (Preliminary result)   Collection Time: 12/30/19  7:59 PM   Specimen: BLOOD  Result Value Ref Range Status   Specimen Description BLOOD RIGHT HAND  Final   Special Requests   Final    BOTTLES DRAWN AEROBIC ONLY Blood Culture results may not be optimal due to an inadequate volume of blood received in culture bottles   Culture   Final    NO GROWTH 3 DAYS Performed at [facility], [address] [place], [place] [address]    Report Status PENDING  Incomplete         Radiology Studies: No results found.      Scheduled Meds: . acidophilus  1 capsule Oral Daily  . vitamin C  1,000 mg Oral BID  . [START ON 01/03/2020] azithromycin  250 mg Oral Once  . B-complex with vitamin C  1 tablet Oral Daily  . budesonide  1 mg Nebulization BID  . docusate sodium  200 mg Oral Daily  . enoxaparin (LOVENOX) injection  40 mg Subcutaneous BID  . ipratropium-albuterol  3 mL Nebulization Q4H  . LORazepam  1 mg Oral Once  . magnesium gluconate  500 mg Oral Daily  . methylPREDNISolone (SOLU-MEDROL) injection  60 mg Intravenous Q12H  . omega-3 acid ethyl esters  1 g Oral Daily  . pantoprazole  40 mg Oral Q1200  . PARoxetine  40 mg Oral QHS  . polyethylene glycol  17 g Oral BID   Continuous Infusions:    LOS: 3 days    Time spent:35 min with >50% on co    Nolberto Hanlon, MD [facility] Pager [phone]  If 7PM-7AM, please contact night-coverage [email or web address] 01/02/2020, 8:57 PM

## 2020-01-02 NOTE — TOC Initial Note (Signed)
Transition of Care [facility]) - Initial/Assessment Note    Patient Details  Name: Scott [name] MRN: [id number] Date of Birth: October 06, 1970  Transition of Care [facility]) CM/SW Contact:    Shelbie Ammons, RN Phone Number: 01/02/2020, 9:15 AM  Clinical Narrative:    RNCM met with patient at bedside. Patient sitting up to side of bed doing breathing treatment on arrival. Patient is noted to be short of breath with continued wheezing even with conversation. Patient reports to be agreeable to home health services. Believes he has had [facility] in the past and is agreeable to them coming back out as long as [name] is not assigned to him. Patient denies need for any equipment at this time.  RNCM reached out to [facility] with [facility] to see if he could accept the referral.        Expected Discharge Plan: [place] Barriers to Discharge: Continued Medical Work up   Patient Goals and CMS Choice   CMS Medicare.gov Compare Post Acute Care list provided to:: Patient Choice offered to / list presented to : Patient  Expected Discharge Plan and Services Expected Discharge Plan: [place] Choice: [place] arrangements for the past 2 months: [place] Arranged: RN, PT, OT [facility] Agency: [place] ([place]) Date HH Agency Contacted: 01/02/20 Time [place]: [phone] Representative spoke with at [place]: [place] Arrangements/Services Living arrangements for the past 2 months: [place] Lives with:: Spouse Patient language and need for interpreter reviewed:: Yes Do you feel safe going back to the place where you live?: Yes      Need for Family Participation in Patient Care: Yes (Comment) Care giver support system in place?: Yes (comment)   Criminal Activity/Legal Involvement Pertinent to Current Situation/Hospitalization: No - Comment as needed  Activities of Daily  Living Home Assistive Devices/Equipment: None ADL Screening (condition at time of admission) Patient's cognitive ability adequate to safely complete daily activities?: Yes Is the patient deaf or have difficulty hearing?: No Does the patient have difficulty seeing, even when wearing glasses/contacts?: No Does the patient have difficulty concentrating, remembering, or making decisions?: No Patient able to express need for assistance with ADLs?: Yes Does the patient have difficulty dressing or bathing?: No Independently performs ADLs?: Yes (appropriate for developmental age) Does the patient have difficulty walking or climbing stairs?: Yes Weakness of Legs: Both Weakness of Arms/Hands: Both  Permission Sought/Granted                  Emotional Assessment Appearance:: Appears stated age Attitude/Demeanor/Rapport: Engaged Affect (typically observed): Appropriate, Calm Orientation: : Oriented to Self, Oriented to Place, Oriented to  Time, Oriented to Situation Alcohol / Substance Use: Not Applicable Psych Involvement: No (comment)  Admission diagnosis:  Bronchitis [J40] COPD exacerbation ([place]) [J44.1] Acute on chronic respiratory failure with hypoxia ([place]) [J96.21] Patient Active Problem List   Diagnosis Date Noted  . COPD exacerbation ([place]) 12/30/2019  . Acute on chronic respiratory failure with hypoxia ([place]) 12/30/2019  . Depression 12/30/2019  . Acute renal failure superimposed on stage 2 chronic kidney disease ([place]) 12/30/2019  . Chronic diastolic heart failure ([place]) 02/16/2018  . Accelerated hypertension 02/05/2018  . Acute diastolic heart failure ([place])   . Kyphoscoliosis and  scoliosis 01/30/2018  . Erectile dysfunction 09/09/2016  . Disorder of bursae of shoulder region 09/07/2016  . Impingement syndrome of shoulder region 09/07/2016  . Anxiety 09/11/2014  . Airway hyperreactivity 09/11/2014  . CN (constipation) 09/11/2014  . Alopecia, male pattern 09/11/2014  .  Decreased libido 09/11/2014  . Dysfunction of eustachian tube 09/11/2014  . Groin strain 09/11/2014  . Cephalalgia 09/11/2014  . Umbilical hernia 53/39/1792  . Essential hypertension 09/11/2014  . Migraine 09/11/2014  . Neoplasm of uncertain behavior 17/83/7542  . Hernia of anterior abdominal wall 09/11/2014  . Contact dermatitis due to Genus Toxicodendron 09/11/2014  . Open-angle glaucoma 05/20/2009  . Congenital hydrocephalus ([place]) 05/20/2009  . 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 Enteritis presumed infectious 06/11/2008  . HLD (hyperlipidemia) 03/15/2008  . Clinical depression 06/13/2007   PCP:  Margo Common, PA Pharmacy:   [facility] [phone] [address], [address] [place] [address] Phone: [phone] Fax: [place], [place] [address] Phone: [phone] Fax: [phone]     Social Determinants of Health (SDOH) Interventions    Readmission Risk Interventions No flowsheet data found.

## 2020-01-02 NOTE — Progress Notes (Signed)
Occupational Therapy Treatment Patient Details Name: Scott [name] MRN: [id number] DOB: Sep 27, 1970 Today's Date: 01/02/2020    History of present illness Scott [name] is a 49 y/o male who was admitted for COPD exacerbation. CC included worseing SOB x several days. PMH includes hypertension, hyperlipidemia, COPD (on 2 L of oxygen only at night), asthma, GERD, depression, anxiety, Klippel-Feil syndrome, congenital hydrocephalus, dCHF, former smoker,  and CKD stage II.    OT comments  [name] was seen for OT treatment on this date. Upon arrival to room pt awake/alert, seated upright at EOB with bed alarm off. Pt on 1 L [place], denies pain, and agreeable to OT tx this date.le to tx. Pt instructed in safe use of AE for LB ADL management including use of the [facility] reacher and sock aid this date. Pt return demos understanding of education provided and is able to doff/don hospital socks with min cueing for technique. Pt continues to have limited activity tolerance, and easily becomes SOB with activity/exertion. OT provides education on energy conservation strategies t/o functional tasks. Pt demonstrates good recall/carryover from past sessions, but requires cueing to utilize strategies such as pursed lip breathing during session. Pt making good progress toward goals and continues to benefit from skilled OT services to maximize return to PLOF and minimize risk of future falls, injury, caregiver burden, and readmission. Will continue to follow POC. Discharge recommendation remains appropriate.    Follow Up Recommendations  Home health OT    Equipment Recommendations  Other (comment);Tub/shower bench (Reacher, Sock Aid, LH sponge, LH shoe horn)    Recommendations for Other Services      Precautions / Restrictions Precautions Precautions: Fall Restrictions Weight Bearing Restrictions: No       Mobility Bed Mobility Overal bed mobility: Modified Independent             General bed mobility comments: Pt  recieved sitting EOB.  Transfers Overall transfer level: Modified independent Equipment used: None Transfers: Sit to/from Stand Sit to Stand: Modified independent (Device/Increase time)         General transfer comment: MOD I for O2 and lines mgmt     Balance Overall balance assessment: Needs assistance Sitting-balance support: No upper extremity supported;Feet supported Sitting balance-Leahy Scale: Good Sitting balance - Comments: Steady static sitting, reaching within BOS. No LOB appreciated with dynamic sitting tasks including LBD.   Standing balance support: No upper extremity supported;During functional activity Standing balance-Leahy Scale: Good                             ADL either performed or assessed with clinical judgement   ADL Overall ADL's : Needs assistance/impaired                 Upper Body Dressing : Sitting;Modified independent   Lower Body Dressing: Set up;Supervision/safety;Sit to/from stand Lower Body Dressing Details (indicate cue type and reason): Pt return demos understanding of safe use of LH reacher and sock aid to doff/don hospital socks. Min cueing for sequencing. He maintains good sitting balance, and safety awareness t/o. Requires min increased time to perform 2/2 limited AROM in B hands.             Functional mobility during ADLs: Modified independent General ADL Comments: Pt maintains spO2 of 92% while seated EOB for ADL management this date. Educated on energy conservation strategies including activity pacing and pursed lip breathing to support safety and functional independence during ADL  management.     Vision Patient Visual Report: No change from baseline     Perception     Praxis      Cognition Arousal/Alertness: Awake/alert Behavior During Therapy: WFL for tasks assessed/performed Overall Cognitive Status: Within Functional Limits for tasks assessed                                 General  Comments: Pt alert and oriented x 4.        Exercises Other Exercises Other Exercises: Pt educated re: OT role, DME recs, d/c recs, ECS, falls prevention Other Exercises: OT facilitates LB dressing tasks including use on LH reacher and sock aid to doff/don hospital socks this date. See ADL section.   Shoulder Instructions       General Comments Pt spO2 remains 92-93% t/o session with pt on 1 L [place]. HR remains in low 110's with activity.    Pertinent Vitals/ Pain       Pain Assessment: No/denies pain  Home Living                                          Prior Functioning/Environment              Frequency  Min 1X/week        Progress Toward Goals  OT Goals(current goals can now be found in the care plan section)  Progress towards OT goals: Progressing toward goals  Acute Rehab OT Goals Patient Stated Goal: to go home OT Goal Formulation: With patient/family Time For Goal Achievement: 01/14/20 Potential to Achieve Goals: Good  Plan Discharge plan remains appropriate;Frequency remains appropriate    Co-evaluation                 AM-PAC OT "6 Clicks" Daily Activity     Outcome Measure   Help from another person eating meals?: None Help from another person taking care of personal grooming?: A Little Help from another person toileting, which includes using toliet, bedpan, or urinal?: None Help from another person bathing (including washing, rinsing, drying)?: A Little Help from another person to put on and taking off regular upper body clothing?: None Help from another person to put on and taking off regular lower body clothing?: A Little 6 Click Score: 21    End of Session Equipment Utilized During Treatment: Oxygen  OT Visit Diagnosis: Other abnormalities of gait and mobility (R26.89)   Activity Tolerance Patient tolerated treatment well   Patient Left in bed;with call bell/phone within reach   Nurse Communication           Time: 1194-1740 OT Time Calculation (min): 27 min  Charges: OT General Charges $OT Visit: 1 Visit OT Treatments $Self Care/Home Management : 8-22 mins $Therapeutic Activity: 8-22 mins  Shara Blazing, M.S., OTR/L Ascom: [phone] 01/02/20, 2:09 PM

## 2020-01-02 NOTE — Care Management Important Message (Signed)
Important Message  Patient Details  Name: Scott [name] MRN: [id number] Date of Birth: [birth year]   Medicare Important Message Given:  Yes     Dannette Barbara 01/02/2020, 11:11 AM

## 2020-01-03 ENCOUNTER — Inpatient Hospital Stay: Payer: Medicare Other | Admitting: Family Medicine

## 2020-01-03 ENCOUNTER — Inpatient Hospital Stay: Payer: Medicare Other

## 2020-01-03 DIAGNOSIS — J441 Chronic obstructive pulmonary disease with (acute) exacerbation: Secondary | ICD-10-CM | POA: Diagnosis not present

## 2020-01-03 LAB — BASIC METABOLIC PANEL
Anion gap: 10 (ref 5–15)
BUN: 31 mg/dL — ABNORMAL HIGH (ref 6–20)
CO2: 38 mmol/L — ABNORMAL HIGH (ref 22–32)
Calcium: 9.2 mg/dL (ref 8.9–10.3)
Chloride: 87 mmol/L — ABNORMAL LOW (ref 98–111)
Creatinine, Ser: 1.14 mg/dL (ref 0.61–1.24)
GFR calc Af Amer: >60 mL/min (ref >60)
GFR calc non Af Amer: >60 mL/min (ref >60)
Glucose, Bld: 205 mg/dL — ABNORMAL HIGH (ref 70–99)
Potassium: 4.2 mmol/L (ref 3.5–5.1)
Sodium: 135 mmol/L (ref 135–145)

## 2020-01-03 LAB — C-REACTIVE PROTEIN: CRP: 1.4 mg/dL — ABNORMAL HIGH (ref <1.0)

## 2020-01-03 LAB — FIBRIN DERIVATIVES D-DIMER (ARMC ONLY): Fibrin derivatives D-dimer (ARMC): 231.07 ng/mL (FEU) (ref 0.00–499.00)

## 2020-01-03 LAB — PROCALCITONIN: Procalcitonin: <0.1 ng/mL

## 2020-01-03 LAB — FERRITIN: Ferritin: 66 ng/mL (ref 24–336)

## 2020-01-03 MED ORDER — FUROSEMIDE 10 MG/ML IJ SOLN
20.0000 mg | Freq: Two times a day (BID) | INTRAMUSCULAR | Status: DC
  Administered 2020-01-03 – 2020-01-04 (×2): 20 mg via INTRAVENOUS
  Filled 2020-01-03 (×2): qty 4

## 2020-01-03 MED ORDER — PREDNISONE 20 MG PO TABS
40.0000 mg | ORAL_TABLET | Freq: Every day | ORAL | Status: DC
  Administered 2020-01-04 – 2020-01-05 (×2): 40 mg via ORAL
  Filled 2020-01-03 (×2): qty 2

## 2020-01-03 MED ORDER — FUROSEMIDE 20 MG PO TABS
20.0000 mg | ORAL_TABLET | Freq: Every day | ORAL | Status: DC
  Administered 2020-01-03: 20 mg via ORAL
  Filled 2020-01-03: qty 1

## 2020-01-03 MED ORDER — FUROSEMIDE 10 MG/ML IJ SOLN: 20.0000 mg | Freq: Two times a day (BID) | INTRAMUSCULAR | Status: DC

## 2020-01-03 NOTE — Progress Notes (Signed)
Walked around nurses station with PT without oxygen. Ox. Dropped lowest to 87%. When back in room oxygen reapplied at 3l [place] and ox. Quickly came back to 96%. Oxygen to 2l now. Sitting up in chair.

## 2020-01-03 NOTE — Progress Notes (Signed)
Physical Therapy Treatment Patient Details Name: Scott [name] MRN: [id number] DOB: May 11, 1970 Today's Date: 01/03/2020    History of Present Illness Scott [name] is a 49 y/o male who was admitted for COPD exacerbation. CC included worseing SOB x several days. PMH includes hypertension, hyperlipidemia, COPD (on 2 L of oxygen only at night), asthma, GERD, depression, anxiety, Klippel-Feil syndrome, congenital hydrocephalus, dCHF, former smoker,  and CKD stage II.     PT Comments    Pt received sitting edge of bed just having finished breakfast. Pt agreeable to participate in PT session this morning. Pt on 2L O2 at 95% with HR at 119. Pt ambulated a total of 220 feet without AD, remaining on 2L O2, and required two leaning rest breaks secondary to SOB with increased work of breathing. During ambulation, pt desat to 89-90% however able to recover to low 90s within 1 minute of pursed lip breathing, with HR remaining within 120-130. Upon return to room, pt sat edge of bed and desat to 87% whit notable wheezing and required ~2 minutes to recover to O2 sats in low to mid 90s. Dr. Kurtis Bushman arrived toward end of session and requested to increase O2 to 3L with recommendation for pt to sit up in recliner chair with BLE elevated on pillows. Pt performed 10 reps of incentive spirometer and averaged 275-300 mL reaching 500 mL once. Pt's O2 increased to 3L and pt left in recliner chair with feet elevated in reclined position with 2 pillows underneath BLE. Pt limited overall by functional activity tolerance and increased work of breathing. Will continue to follow and progress as tolerated.    Follow Up Recommendations  Home health PT     Equipment Recommendations  None recommended by PT    Recommendations for Other Services       Precautions / Restrictions Precautions Precautions: Fall Restrictions Weight Bearing Restrictions: No    Mobility  Bed Mobility               General bed mobility comments:  Not performed due to pt sitting edge of bed upon arrival.  Transfers Overall transfer level: Modified independent Equipment used: None Transfers: Sit to/from Stand Sit to Stand: Modified independent (Device/Increase time)         General transfer comment: Pt requires increased time to come into standing. Assist for line management only.  Ambulation/Gait Ambulation/Gait assistance: Supervision;Min guard Gait Distance (Feet): 220 Feet Assistive device: None Gait Pattern/deviations: Step-through pattern;Decreased step length - right;Decreased step length - left Gait velocity: decreased   General Gait Details: Pt ambulated a total of 220 feet initially with SBA for safety then as pt became more fatigued, pt required CGA for safety. Pt required 2 leaning rest breaks and labored breathing noted.   Stairs             Wheelchair Mobility    Modified Rankin (Stroke Patients Only)       Balance Overall balance assessment: Needs assistance Sitting-balance support: No upper extremity supported;Feet supported Sitting balance-Leahy Scale: Good Sitting balance - Comments: no overt LOB noted   Standing balance support: No upper extremity supported;Bilateral upper extremity supported Standing balance-Leahy Scale: Good Standing balance comment: dynamic balance required supervision without UE support then pt with BUE during leaning rest break with SBA for safety                            Cognition Arousal/Alertness: Awake/alert Behavior During Therapy: [facility]  for tasks assessed/performed Overall Cognitive Status: Within Functional Limits for tasks assessed                                        Exercises Other Exercises Other Exercises: Pt utilized IS x 10 with average inhalation reaching 275-368mL; one attempt reached 500 mL.    General Comments        Pertinent Vitals/Pain Pain Assessment: No/denies pain    Home Living                       Prior Function            PT Goals (current goals can now be found in the care plan section) Acute Rehab PT Goals Patient Stated Goal: to go home PT Goal Formulation: With patient Time For Goal Achievement: 01/14/20 Potential to Achieve Goals: Good Progress towards PT goals: Progressing toward goals    Frequency    Min 2X/week      PT Plan Current plan remains appropriate    Co-evaluation              AM-PAC PT "6 Clicks" Mobility   Outcome Measure  Help needed turning from your back to your side while in a flat bed without using bedrails?: A Little Help needed moving from lying on your back to sitting on the side of a flat bed without using bedrails?: A Little Help needed moving to and from a bed to a chair (including a wheelchair)?: A Little Help needed standing up from a chair using your arms (e.g., wheelchair or bedside chair)?: A Little Help needed to walk in hospital room?: A Little Help needed climbing 3-5 steps with a railing? : A Little 6 Click Score: 18    End of Session Equipment Utilized During Treatment: Gait belt;Oxygen;Other (comment) (2L O2 via nasal cannula) Activity Tolerance: Patient limited by fatigue Patient left: in chair;with call bell/phone within reach Nurse Communication: Mobility status PT Visit Diagnosis: Unsteadiness on feet (R26.81);Muscle weakness (generalized) (M62.81);Other abnormalities of gait and mobility (R26.89)     Time: 1856-3149 PT Time Calculation (min) (ACUTE ONLY): 31 min  Charges:                        Vale Haven, SPT   Vale Haven 01/03/2020, 12:36 PM

## 2020-01-03 NOTE — Progress Notes (Signed)
PROGRESS NOTE    Scott [name]  IFO:[id number] DOB: Nov 04, 1970 DOA: 12/29/2019 PCP: Margo Common, PA    Brief Narrative:  Scott [name] is a 49 y.o. male with medical history significant of hypertension, hyperlipidemia, COPD (on 2 L of oxygen only at night), asthma, GERD, depression, anxiety, Klippel-Feil syndrome, congenital hydrocephalus, dCHF, former smoker, CKD stage II, who presents with shortness of breath.  Was started on IV steroids for acute on chronic respiratory failure due to COPD exacerbation    Consultants:   pccm  Procedures:   Antimicrobials:       Subjective: Walked with RT today with 2 L his O2 sats dropped to 87 had to increase her's oxygenation to 3 L.  He feels dyspnea on exertion.  Objective: Vitals:   01/03/20 0740 01/03/20 0916 01/03/20 1110 01/03/20 1220  BP:  (!) 133/57    Pulse:  (!) 108    Resp:      Temp:      TempSrc:      SpO2: 98%  98% 97%  Weight:      Height:        Intake/Output Summary (Last 24 hours) at 01/03/2020 1525 Last data filed at 01/02/2020 1856 Gross per 24 hour  Intake 360 ml  Output --  Net 360 ml   Filed Weights   12/29/19 2044 12/31/19 0106  Weight: 99.8 kg 99.6 kg    Examination:  General exam: Appears calm and comfortable  Respiratory system: mild scattered crackles b/l , forced expiratory wheezing. no rhonchi Cardiovascular system: S1 & S2 heard, RRR. No JVD, murmurs, rubs, gallops or clicks.  Gastrointestinal system: Abdomen is nondistended, soft and nontender. obese Normal bowel sounds heard. Central nervous system: Alert and oriented. No focal neurological deficits. Extremities: +pitting edema, L>R. Little improvement since yesterday Skin: Warm dry Psychiatry: Judgement and insight appear normal. Mood & affect appropriate.     Data Reviewed: I have personally reviewed following labs and imaging studies  CBC: Recent Labs  Lab 12/29/19 2056 12/31/19 0445 01/01/20 0404 01/02/20 0553  WBC  10.8* 15.9* 15.4* 11.6*  HGB 15.5 14.9 14.3 14.2  HCT 46.7 46.8 45.9 43.5  MCV 94.2 96.3 98.5 95.2  PLT 345 363 346 676   Basic Metabolic Panel: Recent Labs  Lab 12/29/19 2056 12/31/19 0445 01/01/20 0404 01/02/20 0553 01/03/20 0455  NA 137 136 137 136 135  K 4.1 3.6 3.5 3.7 4.2  CL 88* 84* 86* 88* 87*  CO2 36* 37* 37* 38* 38*  GLUCOSE 178* 240* 255* 207* 205*  BUN 25* 47* 47* 37* 31*  CREATININE 1.53* 2.04* 1.61* 1.34* 1.14  CALCIUM 9.0 9.0 9.2 9.0 9.2   GFR: Estimated Creatinine Clearance: 80.5 mL/min (by C-G formula based on SCr of 1.14 mg/dL). Liver Function Tests: No results for input(s): AST, ALT, ALKPHOS, BILITOT, PROT, ALBUMIN in the last 168 hours. No results for input(s): LIPASE, AMYLASE in the last 168 hours. No results for input(s): AMMONIA in the last 168 hours. Coagulation Profile: No results for input(s): INR, PROTIME in the last 168 hours. Cardiac Enzymes: No results for input(s): CKTOTAL, CKMB, CKMBINDEX, TROPONINI in the last 168 hours. BNP (last 3 results) No results for input(s): PROBNP in the last 8760 hours. HbA1C: No results for input(s): HGBA1C in the last 72 hours. CBG: No results for input(s): GLUCAP in the last 168 hours. Lipid Profile: No results for input(s): CHOL, HDL, LDLCALC, TRIG, CHOLHDL, LDLDIRECT in the last 72 hours. Thyroid Function Tests:  No results for input(s): TSH, T4TOTAL, FREET4, T3FREE, THYROIDAB in the last 72 hours. Anemia Panel: Recent Labs    01/03/20 1425  FERRITIN 66   Sepsis Labs: Recent Labs  Lab 12/30/19 0122 01/03/20 1425  PROCALCITON <0.10 <0.10    Recent Results (from the past 240 hour(s))  Respiratory Panel by RT PCR (Flu A&B, Covid) - Nasopharyngeal Swab     Status: None   Collection Time: 12/30/19  3:08 AM   Specimen: Nasopharyngeal Swab  Result Value Ref Range Status   SARS Coronavirus 2 by RT PCR NEGATIVE NEGATIVE Final    Comment: (NOTE) SARS-CoV-2 target nucleic acids are NOT  DETECTED.  The SARS-CoV-2 RNA is generally detectable in upper respiratoy specimens during the acute phase of infection. The lowest concentration of SARS-CoV-2 viral copies this assay can detect is 131 copies/mL. A negative result does not preclude SARS-Cov-2 infection and should not be used as the sole basis for treatment or other patient management decisions. A negative result may occur with  improper specimen collection/handling, submission of specimen other than nasopharyngeal swab, presence of viral mutation(s) within the areas targeted by this assay, and inadequate number of viral copies (<131 copies/mL). A negative result must be combined with clinical observations, patient history, and epidemiological information. The expected result is Negative.  Fact Sheet for Patients:  PinkCheek.be  Fact Sheet for Healthcare Providers:  GravelBags.it  This test is no t yet approved or cleared by the [place] FDA and  has been authorized for detection and/or diagnosis of SARS-CoV-2 by FDA under an Emergency Use Authorization (EUA). This EUA will remain  in effect (meaning this test can be used) for the duration of the COVID-19 declaration under Section 564(b)(1) of the Act, 21 U.S.C. section 360bbb-3(b)(1), unless the authorization is terminated or revoked sooner.     Influenza A by PCR NEGATIVE NEGATIVE Final   Influenza B by PCR NEGATIVE NEGATIVE Final    Comment: (NOTE) The Xpert Xpress SARS-CoV-2/FLU/RSV assay is intended as an aid in  the diagnosis of influenza from Nasopharyngeal swab specimens and  should not be used as a sole basis for treatment. Nasal washings and  aspirates are unacceptable for Xpert Xpress SARS-CoV-2/FLU/RSV  testing.  Fact Sheet for Patients: PinkCheek.be  Fact Sheet for Healthcare Providers: GravelBags.it  This test is not yet  approved or cleared by the [place] FDA and  has been authorized for detection and/or diagnosis of SARS-CoV-2 by  FDA under an Emergency Use Authorization (EUA). This EUA will remain  in effect (meaning this test can be used) for the duration of the  Covid-19 declaration under Section 564(b)(1) of the Act, 21  U.S.C. section 360bbb-3(b)(1), unless the authorization is  terminated or revoked. Performed at [facility], [address] [place], [place] [address]   Culture, blood (routine x 2) Call MD if unable to obtain prior to antibiotics being given     Status: None (Preliminary result)   Collection Time: 12/30/19  7:59 PM   Specimen: BLOOD  Result Value Ref Range Status   Specimen Description BLOOD RIGHT FA  Final   Special Requests   Final    BOTTLES DRAWN AEROBIC AND ANAEROBIC Blood Culture adequate volume   Culture   Final    NO GROWTH 4 DAYS Performed at [facility], [address], [place], [place] [address]    Report Status PENDING  Incomplete  Culture, blood (routine x 2) Call MD if unable to obtain prior to antibiotics  being given     Status: None (Preliminary result)   Collection Time: 12/30/19  7:59 PM   Specimen: BLOOD  Result Value Ref Range Status   Specimen Description BLOOD RIGHT HAND  Final   Special Requests   Final    BOTTLES DRAWN AEROBIC ONLY Blood Culture results may not be optimal due to an inadequate volume of blood received in culture bottles   Culture   Final    NO GROWTH 4 DAYS Performed at [facility], [address] [place], [place] [address]    Report Status PENDING  Incomplete         Radiology Studies: DG Chest Port 1 View  Result Date: 01/03/2020 CLINICAL DATA:  Acute on chronic respiratory failure EXAM: PORTABLE CHEST 1 VIEW COMPARISON:  12/29/2019 FINDINGS: Cardiac shadow is stable but enlarged. Postsurgical changes and scoliosis in the thoracolumbar spine are again seen and stable. The lungs are well  aerated bilaterally. No focal infiltrate or sizable effusion is seen. No acute bony abnormality is noted. IMPRESSION: Chronic changes in the chest without acute abnormality. Electronically Signed   By: Inez Catalina M.D.   On: 01/03/2020 10:59        Scheduled Meds: . acidophilus  1 capsule Oral Daily  . vitamin C  1,000 mg Oral BID  . B-complex with vitamin C  1 tablet Oral Daily  . budesonide  1 mg Nebulization BID  . docusate sodium  200 mg Oral Daily  . enoxaparin (LOVENOX) injection  40 mg Subcutaneous BID  . furosemide  20 mg Intravenous Q12H  . ipratropium-albuterol  3 mL Nebulization Q4H  . LORazepam  1 mg Oral Once  . magnesium gluconate  500 mg Oral Daily  . omega-3 acid ethyl esters  1 g Oral Daily  . pantoprazole  40 mg Oral Q1200  . PARoxetine  40 mg Oral QHS  . polyethylene glycol  17 g Oral BID  . predniSONE  40 mg Oral Q breakfast   Continuous Infusions:  Assessment & Plan:   Principal Problem:   COPD exacerbation ([place]) Active Problems:   Essential hypertension   Chronic diastolic heart failure (HCC)   Acute on chronic respiratory failure with hypoxia (HCC)   Depression   Acute renal failure superimposed on stage 2 chronic kidney disease (HCC)   Acute on chronic hypoxic respiratory failure:  Mildly improved with lasix but still with DOE.  Consulted pccm, input was appreciated-infiltrates bilaterally suggestive of atypical pneumonia likely causing acute exacerbation of COPD Ordered repeat cxr this am. Ferritin, CRP to evaluate nonspecific active inflammatory process Check Fungitell for fungal etiology Respiratory culture to screen for bacterial pneumonia Check Covid again although patient was double vaccinated in April 21 Check D-dimer-within normal limits Check bilateral ultrasound to rule out DVT Lasix 20 IV twice daily for now Check BNP in a.m.     Acute COPD exacerbation: still quite symptomatic. Not at baseline yet Switch IV steroids to  prednisone Continue duo nebs Continue empiric antibiotics    AKI on CKDII: baseline Cr is 1.1-1.2.  Improving , will continue monitoring Avoid nephrotoxic medications     Acute on Chronic diastolic heart failure: echo 02/05/2018 showed EF of 55 to 60% with grade 1 diastolic dysfunction.  Appears volume overloaded on exam. bnp ordered and elevated.possibly 2/2 steroids. Lasix were on home Will give lasix 20mg  iv x1  Constipation: w/ large stool burden as per XR abd.  Continue bowel regimen with colace, miralax, and senna.  Colace, miralax & senna ordered.   Leukocytosis: likely secondary to steroid use.  Improved     HTN: stable, continue with iv hydralazine. Lasix iv prn    Depression: severity unknown.  Continue on paroxetine   Morbid obesity: BMI 40.1. Would benefit from weight loss. Complicates overall care and prognosis    DVT prophylaxis: lovenox Code Status:full Family Communication: none at bedside  Status is: Inpatient  Remains inpatient appropriate because:IV treatments appropriate due to intensity of illness or inability to take PO   Dispo: The patient is from: Home              Anticipated d/c is to: Home              Anticipated d/c date is: > 3 days              Patient currently is not medically stable to d/c.still with sob/DOE.            LOS: 4 days   Time spent: 35 minutes with more than 50% on [place], MD [facility] Pager [phone]  If 7PM-7AM, please contact night-coverage [email or web address] Password [email or web address] 01/03/2020, 3:25 PM

## 2020-01-03 NOTE — Progress Notes (Signed)
Pulmonary Medicine          Date: 01/03/2020,   MRN# [id number] Scott [name] 1970-12-16     AdmissionWeight: 99.8 kg                 CurrentWeight: 99.6 kg  Referring physician: Dr Kurtis Bushman    CHIEF COMPLAINT:   Acute Exacerbation of COPD   HISTORY OF PRESENT ILLNESS   49 year old male with history of essential hypertension, dyslipidemia lifelong smoker with advanced COPD and chronic hypoxemia using 2 L of supplemental oxygen nocturnally, also with asthma overlap as well as chronic reflux with GERD, major depression, anxiety disorder, congenital hydrocephalus heart failure with preserved ejection fraction, CKD stage II who came in 4 days ago due to progressively worsening dyspnea.  Patient reports dry cough and worsening dyspnea x1 week without flulike illness, fevers chills, denies nausea vomiting diarrhea, denies focal neurologic deficit.  When he arrived to the ED he was tested for Covid which was negative he was found to be tachycardic tachypneic mild elevation of WBC count.  He had chest x-ray done which showed mild groundglass opacification in bilateral lower lung zones as well as kyphoscoliosis.  He has been on hospitalist service and has been doing okay however was noted to be persistently hypoxemic despite supplemental oxygen and pulmonary consultation was placed for additional evaluation and management of patient with complex pulmonary history.   PAST MEDICAL HISTORY   Past Medical History:  Diagnosis Date  . Acid reflux   . Anxiety   . Arthritis   . Asthma   . CHF (congestive heart failure) ([place])   . Depression   . Glaucoma   . Hyperlipidemia   . Hypertension   . Klippel-Feil syndrome      SURGICAL HISTORY   Past Surgical History:  Procedure Laterality Date  . COLOSTOMY    . [place] SHUNT PLACEMENT       FAMILY HISTORY   Family History  Problem Relation Age of Onset  . Multiple sclerosis Mother   . Colon cancer Father   . Heart disease Father    . Lung cancer Maternal Grandmother   . Thyroid cancer Maternal Grandfather   . Ovarian cancer Paternal Grandmother   . Cancer Paternal Grandfather      SOCIAL HISTORY   Social History   Tobacco Use  . Smoking status: Former Smoker    Packs/day: 1.00    Years: 10.00    Pack years: 10.00    Types: Cigarettes    Quit date: 05/2006    Years since quitting: 13.6  . Smokeless tobacco: Never Used  . Tobacco comment: quit 05/2006  Vaping Use  . Vaping Use: Former  . Devices: tried once  Substance Use Topics  . Alcohol use: No    Alcohol/week: 0.0 standard drinks  . Drug use: No     MEDICATIONS    Home Medication:  Current Outpatient Rx  . Order #: [id number] Class: Normal  . Order #: [id number] Class: Normal    Current Medication:  Current Facility-Administered Medications:  .  acetaminophen (TYLENOL) tablet 650 mg, 650 mg, Oral, Q6H PRN, Ivor Costa, MD .  acidophilus (RISAQUAD) capsule 1 capsule, 1 capsule, Oral, Daily, Ivor Costa, MD, 1 capsule at 01/03/20 1245 .  albuterol (PROVENTIL) (2.5 MG/3ML) 0.083% nebulizer solution 2.5 mg, 2.5 mg, Nebulization, Q4H PRN, Ivor Costa, MD, 2.5 mg at 01/03/20 0735 .  alum & mag hydroxide-simeth (MAALOX/MYLANTA) 200-200-20 MG/5ML suspension 30 mL, 30 mL, Oral, Q6H  PRN, Ivor Costa, MD .  ascorbic acid (VITAMIN C) tablet 1,000 mg, 1,000 mg, Oral, BID, Ivor Costa, MD, 1,000 mg at 01/03/20 1031 .  B-complex with vitamin C tablet 1 tablet, 1 tablet, Oral, Daily, Ivor Costa, MD, 1 tablet at 01/03/20 1032 .  budesonide (PULMICORT) nebulizer solution 1 mg, 1 mg, Nebulization, BID, Wyvonnia Dusky, MD, 1 mg at 01/03/20 0739 .  dextromethorphan-guaiFENesin (MUCINEX DM) 30-600 MG per 12 hr tablet 1 tablet, 1 tablet, Oral, BID PRN, Ivor Costa, MD .  docusate sodium (COLACE) capsule 200 mg, 200 mg, Oral, Daily, Wyvonnia Dusky, MD, 200 mg at 01/03/20 1031 .  enoxaparin (LOVENOX) injection 40 mg, 40 mg, Subcutaneous, BID, Ivor Costa, MD, 40 mg  at 01/03/20 1033 .  furosemide (LASIX) injection 20 mg, 20 mg, Intravenous, Q12H, Amery, Sahar, MD .  hydrALAZINE (APRESOLINE) injection 5 mg, 5 mg, Intravenous, Q2H PRN, Ivor Costa, MD .  ipratropium-albuterol (DUONEB) 0.5-2.5 (3) MG/3ML nebulizer solution 3 mL, 3 mL, Nebulization, Q4H, Ivor Costa, MD, 3 mL at 01/03/20 1109 .  LORazepam (ATIVAN) tablet 1 mg, 1 mg, Oral, Once, [place], Bing Neighbors, NP .  magnesium gluconate (MAGONATE) tablet 500 mg, 500 mg, Oral, Daily, Ivor Costa, MD, 500 mg at 01/03/20 1037 .  metoprolol tartrate (LOPRESSOR) injection 5 mg, 5 mg, Intravenous, Q6H PRN, Wyvonnia Dusky, MD .  omega-3 acid ethyl esters (LOVAZA) capsule 1 g, 1 g, Oral, Daily, Ivor Costa, MD, 1 g at 01/03/20 1032 .  ondansetron (ZOFRAN) injection 4 mg, 4 mg, Intravenous, Q8H PRN, Ivor Costa, MD .  pantoprazole (PROTONIX) EC tablet 40 mg, 40 mg, Oral, Q1200, Ivor Costa, MD, 40 mg at 01/03/20 1244 .  PARoxetine (PAXIL) tablet 40 mg, 40 mg, Oral, QHS, Wyvonnia Dusky, MD, 40 mg at 01/02/20 2152 .  polyethylene glycol (MIRALAX / GLYCOLAX) packet 17 g, 17 g, Oral, BID, Wyvonnia Dusky, MD, 17 g at 01/03/20 1033 .  predniSONE (DELTASONE) tablet 40 mg, 40 mg, Oral, Q breakfast, Kurtis Bushman, Sahar, MD .  senna-docusate (Senokot-S) tablet 1 tablet, 1 tablet, Oral, Daily PRN, Wyvonnia Dusky, MD    ALLERGIES   Codeine     REVIEW OF SYSTEMS    Review of Systems:  Gen:  Denies  fever, sweats, chills weigh loss  HEENT: Denies blurred vision, double vision, ear pain, eye pain, hearing loss, nose bleeds, sore throat Cardiac:  No dizziness, chest pain or heaviness, chest tightness,edema Resp:   Denies cough or sputum porduction, shortness of breath,wheezing, hemoptysis,  Gi: Denies swallowing difficulty, stomach pain, nausea or vomiting, diarrhea, constipation, bowel incontinence Gu:  Denies bladder incontinence, burning urine Ext:   Denies Joint pain, stiffness or swelling Skin: Denies   skin rash, easy bruising or bleeding or hives Endoc:  Denies polyuria, polydipsia , polyphagia or weight change Psych:   Denies depression, insomnia or hallucinations   Other:  All other systems negative   VS: BP (!) 133/57 (BP Location: Right Arm)   Pulse (!) 108   Temp 97.7 F (36.5 C) (Oral)   Resp 20   Ht 5\' 2"  (1.575 m)   Wt 99.6 kg   SpO2 97%   BMI 40.16 kg/m      PHYSICAL EXAM    GENERAL:NAD, no fevers, chills, no weakness no fatigue HEAD: Normocephalic, atraumatic.  EYES: Pupils equal, round, reactive to light. Extraocular muscles intact. No scleral icterus.  MOUTH: Moist mucosal membrane. Dentition intact. No abscess noted.  EAR, NOSE, THROAT: Clear without  exudates. No external lesions.  NECK: Supple. No thyromegaly. No nodules. No JVD.  PULMONARY: Diffuse coarse rhonchi right sided +wheezes CARDIOVASCULAR: S1 and S2. Regular rate and rhythm. No murmurs, rubs, or gallops. No edema. Pedal pulses 2+ bilaterally.  GASTROINTESTINAL: Soft, nontender, nondistended. No masses. Positive bowel sounds. No hepatosplenomegaly.  MUSCULOSKELETAL: No swelling, clubbing, or edema. Range of motion full in all extremities.  NEUROLOGIC: Cranial nerves II through XII are intact. No gross focal neurological deficits. Sensation intact. Reflexes intact.  SKIN: No ulceration, lesions, rashes, or cyanosis. Skin warm and dry. Turgor intact.  PSYCHIATRIC: Mood, affect within normal limits. The patient is awake, alert and oriented x 3. Insight, judgment intact.       IMAGING    DG Chest 2 View  Result Date: 12/29/2019 CLINICAL DATA:  Chest pain, for 1 week now acutely worsening EXAM: CHEST - 2 VIEW COMPARISON:  Radiograph 12/12/2019 FINDINGS: Severe scoliotic curvature of the spine with exaggerated thoracic kyphosis and posterior fusion hardware which appears unchanged from the comparison exam. Marked chest wall deformity is again noted as well. There are increasing ill-defined opacities  in the bilateral lungs which may be accentuated by this chest wall deformity. Mild vascular congestion is noted as well. Cardiomediastinal contours are similar to comparison. No pneumothorax or visible effusion. IMPRESSION: 1. Increasing ill-defined opacities in the bilateral lungs, could reflect atypical infection or edema, among other possibilities. 2. Marked chronic chest wall deformity and curvature of the spine with unchanged appearance of the fusion hardware. Electronically Signed   By: Lovena Le M.D.   On: 12/29/2019 21:13   DG Chest 2 View  Result Date: 12/12/2019 CLINICAL DATA:  Shortness of breath. Chronic CHF. Dyspnea with exertion. EXAM: CHEST - 2 VIEW COMPARISON:  Radiograph 02/20/2018. FINDINGS: Lower lung volumes from prior exam. Mild distortion of normal anatomy related to scoliosis. Stable heart size and mediastinal contours. Mild vascular congestion without overt pulmonary edema. No focal airspace disease. No pleural effusion or pneumothorax. Scoliosis with spinal fusion hardware in the thoracic spine. No acute osseous abnormalities are seen. IMPRESSION: Lower lung volumes from prior exam. Mild vascular congestion. Electronically Signed   By: Keith Rake M.D.   On: 12/12/2019 23:19   DG Chest Port 1 View  Result Date: 01/03/2020 CLINICAL DATA:  Acute on chronic respiratory failure EXAM: PORTABLE CHEST 1 VIEW COMPARISON:  12/29/2019 FINDINGS: Cardiac shadow is stable but enlarged. Postsurgical changes and scoliosis in the thoracolumbar spine are again seen and stable. The lungs are well aerated bilaterally. No focal infiltrate or sizable effusion is seen. No acute bony abnormality is noted. IMPRESSION: Chronic changes in the chest without acute abnormality. Electronically Signed   By: Inez Catalina M.D.   On: 01/03/2020 10:59   DG Abd Portable 1V  Result Date: 12/31/2019 CLINICAL DATA:  Abdominal distension. EXAM: PORTABLE ABDOMEN - 1 VIEW COMPARISON:  November 30, 2019. FINDINGS: No  abnormal bowel dilatation is noted. Large amount of stool is seen throughout the colon. No radio-opaque calculi or other significant radiographic abnormality are seen. IMPRESSION: Large stool burden is noted. No evidence of bowel obstruction or ileus. Electronically Signed   By: Marijo Conception M.D.   On: 12/31/2019 18:54      ASSESSMENT/PLAN   Acute hypoxemic respiratory failure  - patient with ill defined bilateral infiltrates suggestive of atypical pneumonia which is likely causing acute exacerbation of COPD  - there is left worse then right infiltrate when comparing previous chest x rays.   -  will check again for covid ( note patient was double vaccinated in April 2021) , will also check RVP for other viral etiologies including walking pneumonia - respiratory culture to screen for bacterial pna - ferritin, CRP to reveal nonspecific active infolammatory process  - fungitell for fungal etiology     Assymetric lower extermity edema   - will check d-dimer for possible PE   - [place] -bilateral r/o DVT    - will hold off on CTPE protocol for now due to renal insufficiency   Moderate Acute exacerbation of COPD  - agree with empiric antibiotics   - agree with COPD carepath    - duonebs q6h prn    - prednisone   Left lower lung zone atelectasis  will order metaneb with saline   - patient is already using incentive spirometry     Thank you for allowing me to participate in the care of this patient.   Patient/Family are satisfied with care plan and all questions have been answered.   This document was prepared using Dragon voice recognition software and may include unintentional dictation errors.     Ottie Glazier, M.D.  Division of [facility]

## 2020-01-03 NOTE — Progress Notes (Signed)
Patient complaining of SOB and vitals were: BP 158/102, PR 113, O2 Sats 95% on 2L via nasal cannula. Diminished lung sounds with expiratory wheezes. Albuterol breathing treatment administered. Respiratory therapy in the room.

## 2020-01-04 ENCOUNTER — Inpatient Hospital Stay: Payer: Medicare Other

## 2020-01-04 DIAGNOSIS — J441 Chronic obstructive pulmonary disease with (acute) exacerbation: Secondary | ICD-10-CM | POA: Diagnosis not present

## 2020-01-04 LAB — CULTURE, BLOOD (ROUTINE X 2)
Culture: NO GROWTH
Culture: NO GROWTH
Special Requests: ADEQUATE

## 2020-01-04 LAB — BASIC METABOLIC PANEL
Anion gap: 8 (ref 5–15)
BUN: 34 mg/dL — ABNORMAL HIGH (ref 6–20)
CO2: 39 mmol/L — ABNORMAL HIGH (ref 22–32)
Calcium: 9.3 mg/dL (ref 8.9–10.3)
Chloride: 90 mmol/L — ABNORMAL LOW (ref 98–111)
Creatinine, Ser: 1.17 mg/dL (ref 0.61–1.24)
GFR calc Af Amer: >60 mL/min (ref >60)
GFR calc non Af Amer: >60 mL/min (ref >60)
Glucose, Bld: 195 mg/dL — ABNORMAL HIGH (ref 70–99)
Potassium: 3.7 mmol/L (ref 3.5–5.1)
Sodium: 137 mmol/L (ref 135–145)

## 2020-01-04 LAB — BRAIN NATRIURETIC PEPTIDE: B Natriuretic Peptide: 40.8 pg/mL (ref 0.0–100.0)

## 2020-01-04 MED ORDER — NYSTATIN 100000 UNIT/ML MT SUSP
5.0000 mL | Freq: Four times a day (QID) | OROMUCOSAL | Status: DC
  Administered 2020-01-04 – 2020-01-06 (×9): 500000 [IU] via ORAL
  Filled 2020-01-04 (×8): qty 5

## 2020-01-04 NOTE — Plan of Care (Signed)
  Problem: Education: Goal: Knowledge of General Education information will improve Description: Including pain rating scale, medication(s)/side effects and non-pharmacologic comfort measures Outcome: Progressing   Problem: Health Behavior/Discharge Planning: Goal: Ability to manage health-related needs will improve Outcome: Progressing   Problem: Clinical Measurements: Goal: Ability to maintain clinical measurements within normal limits will improve Outcome: Progressing Goal: Will remain free from infection Outcome: Progressing Goal: Diagnostic test results will improve Outcome: Progressing Goal: Respiratory complications will improve Outcome: Progressing Goal: Cardiovascular complication will be avoided Outcome: Progressing   Problem: Clinical Measurements: Goal: Will remain free from infection Outcome: Progressing   Problem: Clinical Measurements: Goal: Diagnostic test results will improve Outcome: Progressing   Problem: Clinical Measurements: Goal: Respiratory complications will improve Outcome: Progressing   Problem: Clinical Measurements: Goal: Cardiovascular complication will be avoided Outcome: Progressing  Pt tolerating treatments well.  States he feels less sob today as compred to yesterday - tongue has white thick coating and pt c/o oral discomfort - notified MD and requested nystatin.  Advised pt to swish and spit water after each inhaled steroid to help prevent this.  Understanding is voiced

## 2020-01-04 NOTE — Care Management Important Message (Signed)
Important Message  Patient Details  Name: Scott [name] MRN: [id number] Date of Birth: 1970/08/25   Medicare Important Message Given:  Yes     Dannette Barbara 01/04/2020, 11:11 AM

## 2020-01-04 NOTE — Progress Notes (Signed)
Pulmonary Medicine          Date: 01/04/2020,   MRN# [id number] Scott [name] November 10, 1970     AdmissionWeight: 99.8 kg                 CurrentWeight: 99.6 kg  Referring physician: Dr Kurtis Bushman    CHIEF COMPLAINT:   Acute Exacerbation of COPD   HISTORY OF PRESENT ILLNESS   49 year old male with history of essential hypertension, dyslipidemia lifelong smoker with advanced COPD and chronic hypoxemia using 2 L of supplemental oxygen nocturnally, also with asthma overlap as well as chronic reflux with GERD, major depression, anxiety disorder, congenital hydrocephalus heart failure with preserved ejection fraction, CKD stage II who came in 4 days ago due to progressively worsening dyspnea.  Patient reports dry cough and worsening dyspnea x1 week without flulike illness, fevers chills, denies nausea vomiting diarrhea, denies focal neurologic deficit.  When he arrived to the ED he was tested for Covid which was negative he was found to be tachycardic tachypneic mild elevation of WBC count.  He had chest x-ray done which showed mild groundglass opacification in bilateral lower lung zones as well as kyphoscoliosis.  He has been on hospitalist service and has been doing okay however was noted to be persistently hypoxemic despite supplemental oxygen and pulmonary consultation was placed for additional evaluation and management of patient with complex pulmonary history.   01/04/20- patient is improved. He was walking around room and is only mildy dyspneic with symptomatic improvement.  He is on 3L/min nasal canula and uses 2L at home which is great.   PAST MEDICAL HISTORY   Past Medical History:  Diagnosis Date  . Acid reflux   . Anxiety   . Arthritis   . Asthma   . CHF (congestive heart failure) ([place])   . Depression   . Glaucoma   . Hyperlipidemia   . Hypertension   . Klippel-Feil syndrome      SURGICAL HISTORY   Past Surgical History:  Procedure Laterality Date  . COLOSTOMY      . [place] SHUNT PLACEMENT       FAMILY HISTORY   Family History  Problem Relation Age of Onset  . Multiple sclerosis Mother   . Colon cancer Father   . Heart disease Father   . Lung cancer Maternal Grandmother   . Thyroid cancer Maternal Grandfather   . Ovarian cancer Paternal Grandmother   . Cancer Paternal Grandfather      SOCIAL HISTORY   Social History   Tobacco Use  . Smoking status: Former Smoker    Packs/day: 1.00    Years: 10.00    Pack years: 10.00    Types: Cigarettes    Quit date: 05/2006    Years since quitting: 13.6  . Smokeless tobacco: Never Used  . Tobacco comment: quit 05/2006  Vaping Use  . Vaping Use: Former  . Devices: tried once  Substance Use Topics  . Alcohol use: No    Alcohol/week: 0.0 standard drinks  . Drug use: No     MEDICATIONS    Home Medication:  Current Outpatient Rx  . Order #: [id number] Class: Normal    Current Medication:  Current Facility-Administered Medications:  .  acetaminophen (TYLENOL) tablet 650 mg, 650 mg, Oral, Q6H PRN, Ivor Costa, MD .  acidophilus (RISAQUAD) capsule 1 capsule, 1 capsule, Oral, Daily, Ivor Costa, MD, 1 capsule at 01/04/20 0859 .  albuterol (PROVENTIL) (2.5 MG/3ML) 0.083% nebulizer solution 2.5 mg,  2.5 mg, Nebulization, Q4H PRN, Ivor Costa, MD, 2.5 mg at 01/03/20 0735 .  alum & mag hydroxide-simeth (MAALOX/MYLANTA) 200-200-20 MG/5ML suspension 30 mL, 30 mL, Oral, Q6H PRN, Ivor Costa, MD .  ascorbic acid (VITAMIN C) tablet 1,000 mg, 1,000 mg, Oral, BID, Ivor Costa, MD, 1,000 mg at 01/04/20 0857 .  B-complex with vitamin C tablet 1 tablet, 1 tablet, Oral, Daily, Ivor Costa, MD, 1 tablet at 01/04/20 0859 .  budesonide (PULMICORT) nebulizer solution 1 mg, 1 mg, Nebulization, BID, Wyvonnia Dusky, MD, 1 mg at 01/04/20 0859 .  dextromethorphan-guaiFENesin (MUCINEX DM) 30-600 MG per 12 hr tablet 1 tablet, 1 tablet, Oral, BID PRN, Ivor Costa, MD .  docusate sodium (COLACE) capsule 200 mg, 200 mg,  Oral, Daily, Wyvonnia Dusky, MD, 200 mg at 01/04/20 0857 .  enoxaparin (LOVENOX) injection 40 mg, 40 mg, Subcutaneous, BID, Ivor Costa, MD, 40 mg at 01/04/20 0857 .  hydrALAZINE (APRESOLINE) injection 5 mg, 5 mg, Intravenous, Q2H PRN, Ivor Costa, MD .  ipratropium-albuterol (DUONEB) 0.5-2.5 (3) MG/3ML nebulizer solution 3 mL, 3 mL, Nebulization, Q4H, Ivor Costa, MD, 3 mL at 01/04/20 1259 .  LORazepam (ATIVAN) tablet 1 mg, 1 mg, Oral, Once, [place], Bing Neighbors, NP .  magnesium gluconate (MAGONATE) tablet 500 mg, 500 mg, Oral, Daily, Ivor Costa, MD, 500 mg at 01/04/20 0858 .  metoprolol tartrate (LOPRESSOR) injection 5 mg, 5 mg, Intravenous, Q6H PRN, Wyvonnia Dusky, MD .  nystatin (MYCOSTATIN) 100000 UNIT/ML suspension 500,000 Units, 5 mL, Oral, QID, Nolberto Hanlon, MD, 500,000 Units at 01/04/20 1009 .  omega-3 acid ethyl esters (LOVAZA) capsule 1 g, 1 g, Oral, Daily, Ivor Costa, MD, 1 g at 01/04/20 0857 .  ondansetron (ZOFRAN) injection 4 mg, 4 mg, Intravenous, Q8H PRN, Ivor Costa, MD .  pantoprazole (PROTONIX) EC tablet 40 mg, 40 mg, Oral, Q1200, Ivor Costa, MD, 40 mg at 01/04/20 0858 .  PARoxetine (PAXIL) tablet 40 mg, 40 mg, Oral, QHS, Wyvonnia Dusky, MD, 40 mg at 01/03/20 2049 .  polyethylene glycol (MIRALAX / GLYCOLAX) packet 17 g, 17 g, Oral, BID, Wyvonnia Dusky, MD, 17 g at 01/04/20 0858 .  predniSONE (DELTASONE) tablet 40 mg, 40 mg, Oral, Q breakfast, Kurtis Bushman, Gwynneth Albright, MD, 40 mg at 01/04/20 0857 .  senna-docusate (Senokot-S) tablet 1 tablet, 1 tablet, Oral, Daily PRN, Wyvonnia Dusky, MD    ALLERGIES   Codeine     REVIEW OF SYSTEMS    Review of Systems:  Gen:  Denies  fever, sweats, chills weigh loss  HEENT: Denies blurred vision, double vision, ear pain, eye pain, hearing loss, nose bleeds, sore throat Cardiac:  No dizziness, chest pain or heaviness, chest tightness,edema Resp:   Denies cough or sputum porduction, shortness of breath,wheezing,  hemoptysis,  Gi: Denies swallowing difficulty, stomach pain, nausea or vomiting, diarrhea, constipation, bowel incontinence Gu:  Denies bladder incontinence, burning urine Ext:   Denies Joint pain, stiffness or swelling Skin: Denies  skin rash, easy bruising or bleeding or hives Endoc:  Denies polyuria, polydipsia , polyphagia or weight change Psych:   Denies depression, insomnia or hallucinations   Other:  All other systems negative   VS: BP (!) 146/87 (BP Location: Right Arm)   Pulse (!) 107   Temp 98.4 F (36.9 C) (Oral)   Resp 19   Ht 5\' 2"  (1.575 m)   Wt 99.6 kg   SpO2 98%   BMI 40.16 kg/m      PHYSICAL EXAM  GENERAL:NAD, no fevers, chills, no weakness no fatigue HEAD: Normocephalic, atraumatic.  EYES: Pupils equal, round, reactive to light. Extraocular muscles intact. No scleral icterus.  MOUTH: Moist mucosal membrane. Dentition intact. No abscess noted.  EAR, NOSE, THROAT: Clear without exudates. No external lesions.  NECK: Supple. No thyromegaly. No nodules. No JVD.  PULMONARY: Diffuse coarse rhonchi right sided +wheezes CARDIOVASCULAR: S1 and S2. Regular rate and rhythm. No murmurs, rubs, or gallops. No edema. Pedal pulses 2+ bilaterally.  GASTROINTESTINAL: Soft, nontender, nondistended. No masses. Positive bowel sounds. No hepatosplenomegaly.  MUSCULOSKELETAL: No swelling, clubbing, or edema. Range of motion full in all extremities.  NEUROLOGIC: Cranial nerves II through XII are intact. No gross focal neurological deficits. Sensation intact. Reflexes intact.  SKIN: No ulceration, lesions, rashes, or cyanosis. Skin warm and dry. Turgor intact.  PSYCHIATRIC: Mood, affect within normal limits. The patient is awake, alert and oriented x 3. Insight, judgment intact.       IMAGING    DG Chest 2 View  Result Date: 12/29/2019 CLINICAL DATA:  Chest pain, for 1 week now acutely worsening EXAM: CHEST - 2 VIEW COMPARISON:  Radiograph 12/12/2019 FINDINGS: Severe  scoliotic curvature of the spine with exaggerated thoracic kyphosis and posterior fusion hardware which appears unchanged from the comparison exam. Marked chest wall deformity is again noted as well. There are increasing ill-defined opacities in the bilateral lungs which may be accentuated by this chest wall deformity. Mild vascular congestion is noted as well. Cardiomediastinal contours are similar to comparison. No pneumothorax or visible effusion. IMPRESSION: 1. Increasing ill-defined opacities in the bilateral lungs, could reflect atypical infection or edema, among other possibilities. 2. Marked chronic chest wall deformity and curvature of the spine with unchanged appearance of the fusion hardware. Electronically Signed   By: Lovena Le M.D.   On: 12/29/2019 21:13   DG Chest 2 View  Result Date: 12/12/2019 CLINICAL DATA:  Shortness of breath. Chronic CHF. Dyspnea with exertion. EXAM: CHEST - 2 VIEW COMPARISON:  Radiograph 02/20/2018. FINDINGS: Lower lung volumes from prior exam. Mild distortion of normal anatomy related to scoliosis. Stable heart size and mediastinal contours. Mild vascular congestion without overt pulmonary edema. No focal airspace disease. No pleural effusion or pneumothorax. Scoliosis with spinal fusion hardware in the thoracic spine. No acute osseous abnormalities are seen. IMPRESSION: Lower lung volumes from prior exam. Mild vascular congestion. Electronically Signed   By: Keith Rake M.D.   On: 12/12/2019 23:19   US Venous Img Lower Bilateral (DVT)  Result Date: 01/04/2020 CLINICAL DATA:  Lower extremity edema. EXAM: BILATERAL LOWER EXTREMITY VENOUS DOPPLER ULTRASOUND TECHNIQUE: Gray-scale sonography with graded compression, as well as color Doppler and duplex ultrasound were performed to evaluate the lower extremity deep venous systems from the level of the common femoral vein and including the common femoral, femoral, profunda femoral, popliteal and calf veins including  the posterior tibial, peroneal and gastrocnemius veins when visible. The superficial great saphenous vein was also interrogated. Spectral Doppler was utilized to evaluate flow at rest and with distal augmentation maneuvers in the common femoral, femoral and popliteal veins. COMPARISON:  None. FINDINGS: RIGHT LOWER EXTREMITY Common Femoral Vein: No evidence of thrombus. Normal compressibility, respiratory phasicity and response to augmentation. Saphenofemoral Junction: No evidence of thrombus. Normal compressibility and flow on color Doppler imaging. Profunda Femoral Vein: No evidence of thrombus. Normal compressibility and flow on color Doppler imaging. Femoral Vein: No evidence of thrombus. Normal compressibility, respiratory phasicity and response to augmentation. Popliteal Vein: No evidence of  thrombus. Normal compressibility, respiratory phasicity and response to augmentation. Calf Veins: No evidence of thrombus. Normal compressibility and flow on color Doppler imaging. Superficial Great Saphenous Vein: No evidence of thrombus. Normal compressibility. Venous Reflux:  None. Other Findings: No evidence of superficial thrombophlebitis or abnormal fluid collection. LEFT LOWER EXTREMITY Common Femoral Vein: No evidence of thrombus. Normal compressibility, respiratory phasicity and response to augmentation. Saphenofemoral Junction: No evidence of thrombus. Normal compressibility and flow on color Doppler imaging. Profunda Femoral Vein: No evidence of thrombus. Normal compressibility and flow on color Doppler imaging. Femoral Vein: No evidence of thrombus. Normal compressibility, respiratory phasicity and response to augmentation. Popliteal Vein: No evidence of thrombus. Normal compressibility, respiratory phasicity and response to augmentation. Calf Veins: No evidence of thrombus. Normal compressibility and flow on color Doppler imaging. Superficial Great Saphenous Vein: No evidence of thrombus. Normal  compressibility. Venous Reflux:  None. Other Findings: No evidence of superficial thrombophlebitis or abnormal fluid collection. IMPRESSION: No evidence of deep venous thrombosis in either lower extremity. Electronically Signed   By: Aletta Edouard M.D.   On: 01/04/2020 08:17   DG Chest Port 1 View  Result Date: 01/03/2020 CLINICAL DATA:  Acute on chronic respiratory failure EXAM: PORTABLE CHEST 1 VIEW COMPARISON:  12/29/2019 FINDINGS: Cardiac shadow is stable but enlarged. Postsurgical changes and scoliosis in the thoracolumbar spine are again seen and stable. The lungs are well aerated bilaterally. No focal infiltrate or sizable effusion is seen. No acute bony abnormality is noted. IMPRESSION: Chronic changes in the chest without acute abnormality. Electronically Signed   By: Inez Catalina M.D.   On: 01/03/2020 10:59   DG Abd Portable 1V  Result Date: 12/31/2019 CLINICAL DATA:  Abdominal distension. EXAM: PORTABLE ABDOMEN - 1 VIEW COMPARISON:  November 30, 2019. FINDINGS: No abnormal bowel dilatation is noted. Large amount of stool is seen throughout the colon. No radio-opaque calculi or other significant radiographic abnormality are seen. IMPRESSION: Large stool burden is noted. No evidence of bowel obstruction or ileus. Electronically Signed   By: Marijo Conception M.D.   On: 12/31/2019 18:54      ASSESSMENT/PLAN   Acute hypoxemic respiratory failure  - patient with ill defined bilateral infiltrates suggestive of atypical pneumonia which is likely causing acute exacerbation of COPD  - there is left worse then right infiltrate when comparing previous chest x rays.   - will check again for covid ( note patient was double vaccinated in April 2021) , will also check RVP for other viral etiologies including walking pneumonia - respiratory culture to screen for bacterial pna - ferritin, CRP to reveal nonspecific active infolammatory process  - fungitell for fungal etiology   -10/1- patient is  improved. Continue current care plan.   Assymetric lower extermity edema   - will check d-dimer for possible PE   - [place] -bilateral r/o DVT    - will hold off on CTPE protocol for now due to renal insufficiency   Moderate Acute exacerbation of COPD  - agree with empiric antibiotics   - agree with COPD carepath    - duonebs q6h prn    - prednisone   Left lower lung zone atelectasis  will order metaneb with saline   - patient is already using incentive spirometry     Thank you for allowing me to participate in the care of this patient.   Patient/Family are satisfied with care plan and all questions have been answered.   This document was prepared using Dragon voice  recognition software and may include unintentional dictation errors.     Ottie Glazier, M.D.  Division of [place]

## 2020-01-04 NOTE — Progress Notes (Signed)
PROGRESS NOTE    [name]  ZCH:[id number] DOB: 1970/12/09 DOA: 12/29/2019 PCP: Margo Common, PA   Brief Narrative:  Scott [name] a 49 y.o.malewith medical history significant ofhypertension, hyperlipidemia, COPD (on 2 L of oxygen only at night), asthma, GERD, depression, anxiety,Klippel-Feilsyndrome, congenital hydrocephalus,dCHF, former smoker, CKD stage II, who presents with shortness of breath.  Was started on IV steroids for acute on chronic respiratory failure due to COPD exacerbation  10/1-minimally better. C/o tongue bothering him. Brown plaques seen (while eating breakfast)  Consultants:   pccm  Procedures:   Antimicrobials:    azithromycin    Subjective: Feeling a little better but still short of breath.  Complaining of tongue bothering him and has some formal plaques on it.  He stated it started after using inhalers.  Objective: Vitals:   01/03/20 2002 01/03/20 2359 01/04/20 0042 01/04/20 0427  BP:  (!) 130/97    Pulse:  (!) 102    Resp:  18    Temp:  98.1 F (36.7 C)    TempSrc:      SpO2: 97% 97% 97% 96%  Weight:      Height:       No intake or output data in the 24 hours ending 01/04/20 0808 Filed Weights   12/29/19 2044 12/31/19 0106  Weight: 99.8 kg 99.6 kg    Examination:  General exam: Appears calm and comfortable  HEENT: Brownish plaques on the tongue Respiratory system: Minimal expiratory wheezing, increased expiratory time no rhonchi's Cardiovascular system: S1 & S2 heard, RRR. No JVD, murmurs, rubs, gallops or clicks.  Gastrointestinal system: Abdomen is nondistended, soft and nontender.. Normal bowel sounds heard. Central nervous system: Alert and oriented.  Grossly intact Extremities: No edema Skin: Warm dry Psychiatry: Judgement and insight appear normal. Mood & affect appropriate.     Data Reviewed: I have personally reviewed following labs and imaging studies  CBC: Recent Labs  Lab 12/29/19 2056  12/31/19 0445 01/01/20 0404 01/02/20 0553  WBC 10.8* 15.9* 15.4* 11.6*  HGB 15.5 14.9 14.3 14.2  HCT 46.7 46.8 45.9 43.5  MCV 94.2 96.3 98.5 95.2  PLT 345 363 346 786   Basic Metabolic Panel: Recent Labs  Lab 12/31/19 0445 01/01/20 0404 01/02/20 0553 01/03/20 0455 01/04/20 0541  NA 136 137 136 135 137  K 3.6 3.5 3.7 4.2 3.7  CL 84* 86* 88* 87* 90*  CO2 37* 37* 38* 38* 39*  GLUCOSE 240* 255* 207* 205* 195*  BUN 47* 47* 37* 31* 34*  CREATININE 2.04* 1.61* 1.34* 1.14 1.17  CALCIUM 9.0 9.2 9.0 9.2 9.3   GFR: Estimated Creatinine Clearance: 78.4 mL/min (by C-G formula based on SCr of 1.17 mg/dL). Liver Function Tests: No results for input(s): AST, ALT, ALKPHOS, BILITOT, PROT, ALBUMIN in the last 168 hours. No results for input(s): LIPASE, AMYLASE in the last 168 hours. No results for input(s): AMMONIA in the last 168 hours. Coagulation Profile: No results for input(s): INR, PROTIME in the last 168 hours. Cardiac Enzymes: No results for input(s): CKTOTAL, CKMB, CKMBINDEX, TROPONINI in the last 168 hours. BNP (last 3 results) No results for input(s): PROBNP in the last 8760 hours. HbA1C: No results for input(s): HGBA1C in the last 72 hours. CBG: No results for input(s): GLUCAP in the last 168 hours. Lipid Profile: No results for input(s): CHOL, HDL, LDLCALC, TRIG, CHOLHDL, LDLDIRECT in the last 72 hours. Thyroid Function Tests: No results for input(s): TSH, T4TOTAL, FREET4, T3FREE, THYROIDAB in the last 72  hours. Anemia Panel: Recent Labs    01/03/20 1425  FERRITIN 66   Sepsis Labs: Recent Labs  Lab 12/30/19 0122 01/03/20 1425  PROCALCITON <0.10 <0.10    Recent Results (from the past 240 hour(s))  Respiratory Panel by RT PCR (Flu A&B, Covid) - Nasopharyngeal Swab     Status: None   Collection Time: 12/30/19  3:08 AM   Specimen: Nasopharyngeal Swab  Result Value Ref Range Status   SARS Coronavirus 2 by RT PCR NEGATIVE NEGATIVE Final    Comment:  (NOTE) SARS-CoV-2 target nucleic acids are NOT DETECTED.  The SARS-CoV-2 RNA is generally detectable in upper respiratoy specimens during the acute phase of infection. The lowest concentration of SARS-CoV-2 viral copies this assay can detect is 131 copies/mL. A negative result does not preclude SARS-Cov-2 infection and should not be used as the sole basis for treatment or other patient management decisions. A negative result may occur with  improper specimen collection/handling, submission of specimen other than nasopharyngeal swab, presence of viral mutation(s) within the areas targeted by this assay, and inadequate number of viral copies (<131 copies/mL). A negative result must be combined with clinical observations, patient history, and epidemiological information. The expected result is Negative.  Fact Sheet for Patients:  PinkCheek.be  Fact Sheet for Healthcare Providers:  GravelBags.it  This test is no t yet approved or cleared by the [place] FDA and  has been authorized for detection and/or diagnosis of SARS-CoV-2 by FDA under an Emergency Use Authorization (EUA). This EUA will remain  in effect (meaning this test can be used) for the duration of the COVID-19 declaration under Section 564(b)(1) of the Act, 21 U.S.C. section 360bbb-3(b)(1), unless the authorization is terminated or revoked sooner.     Influenza A by PCR NEGATIVE NEGATIVE Final   Influenza B by PCR NEGATIVE NEGATIVE Final    Comment: (NOTE) The Xpert Xpress SARS-CoV-2/FLU/RSV assay is intended as an aid in  the diagnosis of influenza from Nasopharyngeal swab specimens and  should not be used as a sole basis for treatment. Nasal washings and  aspirates are unacceptable for Xpert Xpress SARS-CoV-2/FLU/RSV  testing.  Fact Sheet for Patients: PinkCheek.be  Fact Sheet for Healthcare  Providers: GravelBags.it  This test is not yet approved or cleared by the [place] FDA and  has been authorized for detection and/or diagnosis of SARS-CoV-2 by  FDA under an Emergency Use Authorization (EUA). This EUA will remain  in effect (meaning this test can be used) for the duration of the  Covid-19 declaration under Section 564(b)(1) of the Act, 21  U.S.C. section 360bbb-3(b)(1), unless the authorization is  terminated or revoked. Performed at [facility], [address] [place], [place] [address]   Culture, blood (routine x 2) Call MD if unable to obtain prior to antibiotics being given     Status: None   Collection Time: 12/30/19  7:59 PM   Specimen: BLOOD  Result Value Ref Range Status   Specimen Description BLOOD RIGHT FA  Final   Special Requests   Final    BOTTLES DRAWN AEROBIC AND ANAEROBIC Blood Culture adequate volume   Culture   Final    NO GROWTH 5 DAYS Performed at [facility], [address] [place], [place] [address]    Report Status 01/04/2020 FINAL  Final  Culture, blood (routine x 2) Call MD if unable to obtain prior to antibiotics being given     Status: None   Collection Time: 12/30/19  7:59 PM   Specimen: BLOOD  Result Value Ref Range Status   Specimen Description BLOOD RIGHT HAND  Final   Special Requests   Final    BOTTLES DRAWN AEROBIC ONLY Blood Culture results may not be optimal due to an inadequate volume of blood received in culture bottles   Culture   Final    NO GROWTH 5 DAYS Performed at [facility], [address], [place], [place] [address]    Report Status 01/04/2020 FINAL  Final         Radiology Studies: [facility] Chest Port 1 View  Result Date: 01/03/2020 CLINICAL DATA:  Acute on chronic respiratory failure EXAM: PORTABLE CHEST 1 VIEW COMPARISON:  12/29/2019 FINDINGS: Cardiac shadow is stable but enlarged. Postsurgical changes and scoliosis in the thoracolumbar spine  are again seen and stable. The lungs are well aerated bilaterally. No focal infiltrate or sizable effusion is seen. No acute bony abnormality is noted. IMPRESSION: Chronic changes in the chest without acute abnormality. Electronically Signed   By: Inez Catalina M.D.   On: 01/03/2020 10:59        Scheduled Meds:  acidophilus  1 capsule Oral Daily   vitamin C  1,000 mg Oral BID   B-complex with vitamin C  1 tablet Oral Daily   budesonide  1 mg Nebulization BID   docusate sodium  200 mg Oral Daily   enoxaparin (LOVENOX) injection  40 mg Subcutaneous BID   ipratropium-albuterol  3 mL Nebulization Q4H   LORazepam  1 mg Oral Once   magnesium gluconate  500 mg Oral Daily   omega-3 acid ethyl esters  1 g Oral Daily   pantoprazole  40 mg Oral Q1200   PARoxetine  40 mg Oral QHS   polyethylene glycol  17 g Oral BID   predniSONE  40 mg Oral Q breakfast   Continuous Infusions:  Assessment & Plan:   Principal Problem:   COPD exacerbation (HCC) Active Problems:   Essential hypertension   Chronic diastolic heart failure (HCC)   Acute on chronic respiratory failure with hypoxia (HCC)   Depression   Acute renal failure superimposed on stage 2 chronic kidney disease (HCC)   Acute on chronic hypoxic respiratory failure: 10/1- improving some, but not close to baseline and still with dyspnea on exertion  PCCM following -recommend continue current management Fungitell for fungal etiology Respiratory culture to screen for bacterial pneumonia D-dimer within normal limits Bilateral ultrasound of legs negative for DVT Will DC IV Lasix     Acute COPD exacerbation:Improving some but still symptomatic and not at baseline  .  Continue with prednisone, duo nebs  Complete empiric antibiotics  Will need to ambulate as tolerated       AKI on CKDII: baseline Cr is 1.1-1.2. Improved.  Will hold Lasix Avoid nephrotoxic medications    Acute onChronic diastolic heart  failure: echo 02/05/2018 showed EF of 55 to 60% with grade 1 diastolic dysfunction. BMP normalized, appears more euvolemic  Will discontinue Lasix  Monitor I's and O's and volume status daily    Constipation: w/ large stool burden as per XR abd. Improving, continue bowel regimen     Leukocytosis: likely secondary to steroid use. Improving.  Afebrile Monitor closely    HTN:  Stable, continue with IV hydralazine.  Will DC IV beta-blocker since he is in acute COPD exacerbation   Oral yeast- on tongue , likely due to inhalers Will start nystatin  Depression: severity unknown.  Continue on paroxetine  Morbid obesity: BMI 40.1. Would benefit from weight loss. Complicates overall care and prognosis   DVT prophylaxis: Lovenox Code Status: Full Family Communication: None at bedside  Status is: Inpatient  Remains inpatient appropriate because:Inpatient level of care appropriate due to severity of illness   Dispo: The patient is from: Home              Anticipated d/c is to: Home              Anticipated d/c date is: 2 days              Patient currently is not medically stable to d/c.            LOS: 5 days   Time spent: 35 minutes with more than 50% on [place], MD [facility] Pager [phone]  If 7PM-7AM, please contact night-coverage [email or web address] Password [email or web address] 01/04/2020, 8:08 AM

## 2020-01-05 DIAGNOSIS — J441 Chronic obstructive pulmonary disease with (acute) exacerbation: Secondary | ICD-10-CM | POA: Diagnosis not present

## 2020-01-05 LAB — CBC
HCT: 43.9 % (ref 39.0–52.0)
Hemoglobin: 14.5 g/dL (ref 13.0–17.0)
MCH: 30.9 pg (ref 26.0–34.0)
MCHC: 33 g/dL (ref 30.0–36.0)
MCV: 93.4 fL (ref 80.0–100.0)
Platelets: 304 10*3/uL (ref 150–400)
RBC: 4.7 MIL/uL (ref 4.22–5.81)
RDW: 13.8 % (ref 11.5–15.5)
WBC: 16.9 10*3/uL — ABNORMAL HIGH (ref 4.0–10.5)
nRBC: 0 % (ref 0.0–0.2)

## 2020-01-05 LAB — FUNGITELL, SERUM: Fungitell Result: <31 pg/mL (ref <80)

## 2020-01-05 MED ORDER — FUROSEMIDE 10 MG/ML IJ SOLN
40.0000 mg | Freq: Every day | INTRAMUSCULAR | Status: DC
  Administered 2020-01-05: 40 mg via INTRAVENOUS
  Filled 2020-01-05: qty 4

## 2020-01-05 MED ORDER — IPRATROPIUM-ALBUTEROL 0.5-2.5 (3) MG/3ML IN SOLN
3.0000 mL | Freq: Four times a day (QID) | RESPIRATORY_TRACT | Status: DC
  Administered 2020-01-05 – 2020-01-06 (×5): 3 mL via RESPIRATORY_TRACT
  Filled 2020-01-05 (×5): qty 3

## 2020-01-05 NOTE — Progress Notes (Signed)
PROGRESS NOTE    Scott [name]  VEH:[id number] DOB: 08/24/1970 DOA: 12/29/2019 PCP: Margo Common, PA   Brief Narrative:  Scott [name] a 49 y.o.malewith medical history significant ofhypertension, hyperlipidemia, COPD (on 2 L of oxygen only at night), asthma, GERD, depression, anxiety,Klippel-Feilsyndrome, congenital hydrocephalus,dCHF, former smoker, CKD stage II, who presents with shortness of breath.  Was started on IV steroids for acute on chronic respiratory failure due to COPD exacerbation  10/1-minimally better. C/o tongue bothering him. Brown plaques seen (while eating breakfast)  10/2- Reports DOE, states he is not ready to go home.   Consultants:   pccm  Procedures:   Antimicrobials:    azithromycin    Subjective: Feeling a little better but still c/o sob with ambulation. .  Objective: Vitals:   01/04/20 2356 01/05/20 0102 01/05/20 0428 01/05/20 0801  BP: (!) 134/91     Pulse: (!) 108   (!) 103  Resp: 20   16  Temp: 98.9 F (37.2 C)     TempSrc: Oral     SpO2: 98% 97% 97% 100%  Weight:      Height:        Intake/Output Summary (Last 24 hours) at 01/05/2020 0823 Last data filed at 01/04/2020 1011 Gross per 24 hour  Intake 260 ml  Output --  Net 260 ml   Filed Weights   12/29/19 2044 12/31/19 0106  Weight: 99.8 kg 99.6 kg    Examination:  Calm, comfortable, NAD Scattered rales bilaterally no wheezing Regular S1-S2 no murmurs rubs gallops Soft nontender nondistended positive bowel sounds Positive pitting edema bilaterally LT > RT Alert oriented x3, mood and affect appropriate for current setting    Data Reviewed: I have personally reviewed following labs and imaging studies  CBC: Recent Labs  Lab 12/29/19 2056 12/31/19 0445 01/01/20 0404 01/02/20 0553 01/05/20 0425  WBC 10.8* 15.9* 15.4* 11.6* 16.9*  HGB 15.5 14.9 14.3 14.2 14.5  HCT 46.7 46.8 45.9 43.5 43.9  MCV 94.2 96.3 98.5 95.2 93.4  PLT 345 363 346 312 662    Basic Metabolic Panel: Recent Labs  Lab 12/31/19 0445 01/01/20 0404 01/02/20 0553 01/03/20 0455 01/04/20 0541  NA 136 137 136 135 137  K 3.6 3.5 3.7 4.2 3.7  CL 84* 86* 88* 87* 90*  CO2 37* 37* 38* 38* 39*  GLUCOSE 240* 255* 207* 205* 195*  BUN 47* 47* 37* 31* 34*  CREATININE 2.04* 1.61* 1.34* 1.14 1.17  CALCIUM 9.0 9.2 9.0 9.2 9.3   GFR: Estimated Creatinine Clearance: 78.4 mL/min (by C-G formula based on SCr of 1.17 mg/dL). Liver Function Tests: No results for input(s): AST, ALT, ALKPHOS, BILITOT, PROT, ALBUMIN in the last 168 hours. No results for input(s): LIPASE, AMYLASE in the last 168 hours. No results for input(s): AMMONIA in the last 168 hours. Coagulation Profile: No results for input(s): INR, PROTIME in the last 168 hours. Cardiac Enzymes: No results for input(s): CKTOTAL, CKMB, CKMBINDEX, TROPONINI in the last 168 hours. BNP (last 3 results) No results for input(s): PROBNP in the last 8760 hours. HbA1C: No results for input(s): HGBA1C in the last 72 hours. CBG: No results for input(s): GLUCAP in the last 168 hours. Lipid Profile: No results for input(s): CHOL, HDL, LDLCALC, TRIG, CHOLHDL, LDLDIRECT in the last 72 hours. Thyroid Function Tests: No results for input(s): TSH, T4TOTAL, FREET4, T3FREE, THYROIDAB in the last 72 hours. Anemia Panel: Recent Labs    01/03/20 1425  FERRITIN 66   Sepsis Labs:  Recent Labs  Lab 12/30/19 0122 01/03/20 1425  PROCALCITON <0.10 <0.10    Recent Results (from the past 240 hour(s))  Respiratory Panel by RT PCR (Flu A&B, Covid) - Nasopharyngeal Swab     Status: None   Collection Time: 12/30/19  3:08 AM   Specimen: Nasopharyngeal Swab  Result Value Ref Range Status   SARS Coronavirus 2 by RT PCR NEGATIVE NEGATIVE Final    Comment: (NOTE) SARS-CoV-2 target nucleic acids are NOT DETECTED.  The SARS-CoV-2 RNA is generally detectable in upper respiratoy specimens during the acute phase of infection. The  lowest concentration of SARS-CoV-2 viral copies this assay can detect is 131 copies/mL. A negative result does not preclude SARS-Cov-2 infection and should not be used as the sole basis for treatment or other patient management decisions. A negative result may occur with  improper specimen collection/handling, submission of specimen other than nasopharyngeal swab, presence of viral mutation(s) within the areas targeted by this assay, and inadequate number of viral copies (<131 copies/mL). A negative result must be combined with clinical observations, patient history, and epidemiological information. The expected result is Negative.  Fact Sheet for Patients:  PinkCheek.be  Fact Sheet for Healthcare Providers:  GravelBags.it  This test is no t yet approved or cleared by the [place] FDA and  has been authorized for detection and/or diagnosis of SARS-CoV-2 by FDA under an Emergency Use Authorization (EUA). This EUA will remain  in effect (meaning this test can be used) for the duration of the COVID-19 declaration under Section 564(b)(1) of the Act, 21 U.S.C. section 360bbb-3(b)(1), unless the authorization is terminated or revoked sooner.     Influenza A by PCR NEGATIVE NEGATIVE Final   Influenza B by PCR NEGATIVE NEGATIVE Final    Comment: (NOTE) The Xpert Xpress SARS-CoV-2/FLU/RSV assay is intended as an aid in  the diagnosis of influenza from Nasopharyngeal swab specimens and  should not be used as a sole basis for treatment. Nasal washings and  aspirates are unacceptable for Xpert Xpress SARS-CoV-2/FLU/RSV  testing.  Fact Sheet for Patients: PinkCheek.be  Fact Sheet for Healthcare Providers: GravelBags.it  This test is not yet approved or cleared by the [place] FDA and  has been authorized for detection and/or diagnosis of SARS-CoV-2 by  FDA under  an Emergency Use Authorization (EUA). This EUA will remain  in effect (meaning this test can be used) for the duration of the  Covid-19 declaration under Section 564(b)(1) of the Act, 21  U.S.C. section 360bbb-3(b)(1), unless the authorization is  terminated or revoked. Performed at [facility], [address] [place], [place] [address]   Culture, blood (routine x 2) Call MD if unable to obtain prior to antibiotics being given     Status: None   Collection Time: 12/30/19  7:59 PM   Specimen: BLOOD  Result Value Ref Range Status   Specimen Description BLOOD RIGHT FA  Final   Special Requests   Final    BOTTLES DRAWN AEROBIC AND ANAEROBIC Blood Culture adequate volume   Culture   Final    NO GROWTH 5 DAYS Performed at [facility], [address] [place], [place] [address]    Report Status 01/04/2020 FINAL  Final  Culture, blood (routine x 2) Call MD if unable to obtain prior to antibiotics being given     Status: None   Collection Time: 12/30/19  7:59 PM   Specimen: BLOOD  Result Value Ref Range Status   Specimen Description BLOOD  RIGHT HAND  Final   Special Requests   Final    BOTTLES DRAWN AEROBIC ONLY Blood Culture results may not be optimal due to an inadequate volume of blood received in culture bottles   Culture   Final    NO GROWTH 5 DAYS Performed at [facility], [address], [place], [place] [address]    Report Status 01/04/2020 FINAL  Final         Radiology Studies: US Venous Img Lower Bilateral (DVT)  Result Date: 01/04/2020 CLINICAL DATA:  Lower extremity edema. EXAM: BILATERAL LOWER EXTREMITY VENOUS DOPPLER ULTRASOUND TECHNIQUE: Gray-scale sonography with graded compression, as well as color Doppler and duplex ultrasound were performed to evaluate the lower extremity deep venous systems from the level of the common femoral vein and including the common femoral, femoral, profunda femoral, popliteal and calf veins including the  posterior tibial, peroneal and gastrocnemius veins when visible. The superficial great saphenous vein was also interrogated. Spectral Doppler was utilized to evaluate flow at rest and with distal augmentation maneuvers in the common femoral, femoral and popliteal veins. COMPARISON:  None. FINDINGS: RIGHT LOWER EXTREMITY Common Femoral Vein: No evidence of thrombus. Normal compressibility, respiratory phasicity and response to augmentation. Saphenofemoral Junction: No evidence of thrombus. Normal compressibility and flow on color Doppler imaging. Profunda Femoral Vein: No evidence of thrombus. Normal compressibility and flow on color Doppler imaging. Femoral Vein: No evidence of thrombus. Normal compressibility, respiratory phasicity and response to augmentation. Popliteal Vein: No evidence of thrombus. Normal compressibility, respiratory phasicity and response to augmentation. Calf Veins: No evidence of thrombus. Normal compressibility and flow on color Doppler imaging. Superficial Great Saphenous Vein: No evidence of thrombus. Normal compressibility. Venous Reflux:  None. Other Findings: No evidence of superficial thrombophlebitis or abnormal fluid collection. LEFT LOWER EXTREMITY Common Femoral Vein: No evidence of thrombus. Normal compressibility, respiratory phasicity and response to augmentation. Saphenofemoral Junction: No evidence of thrombus. Normal compressibility and flow on color Doppler imaging. Profunda Femoral Vein: No evidence of thrombus. Normal compressibility and flow on color Doppler imaging. Femoral Vein: No evidence of thrombus. Normal compressibility, respiratory phasicity and response to augmentation. Popliteal Vein: No evidence of thrombus. Normal compressibility, respiratory phasicity and response to augmentation. Calf Veins: No evidence of thrombus. Normal compressibility and flow on color Doppler imaging. Superficial Great Saphenous Vein: No evidence of thrombus. Normal compressibility.  Venous Reflux:  None. Other Findings: No evidence of superficial thrombophlebitis or abnormal fluid collection. IMPRESSION: No evidence of deep venous thrombosis in either lower extremity. Electronically Signed   By: Aletta Edouard M.D.   On: 01/04/2020 08:17   DG Chest Port 1 View  Result Date: 01/03/2020 CLINICAL DATA:  Acute on chronic respiratory failure EXAM: PORTABLE CHEST 1 VIEW COMPARISON:  12/29/2019 FINDINGS: Cardiac shadow is stable but enlarged. Postsurgical changes and scoliosis in the thoracolumbar spine are again seen and stable. The lungs are well aerated bilaterally. No focal infiltrate or sizable effusion is seen. No acute bony abnormality is noted. IMPRESSION: Chronic changes in the chest without acute abnormality. Electronically Signed   By: Inez Catalina M.D.   On: 01/03/2020 10:59        Scheduled Meds: . acidophilus  1 capsule Oral Daily  . vitamin C  1,000 mg Oral BID  . B-complex with vitamin C  1 tablet Oral Daily  . budesonide  1 mg Nebulization BID  . docusate sodium  200 mg Oral Daily  . enoxaparin (LOVENOX) injection  40 mg Subcutaneous BID  .  ipratropium-albuterol  3 mL Nebulization Q4H  . LORazepam  1 mg Oral Once  . magnesium gluconate  500 mg Oral Daily  . nystatin  5 mL Oral QID  . omega-3 acid ethyl esters  1 g Oral Daily  . pantoprazole  40 mg Oral Q1200  . PARoxetine  40 mg Oral QHS  . polyethylene glycol  17 g Oral BID  . predniSONE  40 mg Oral Q breakfast   Continuous Infusions:  Assessment & Plan:   Principal Problem:   COPD exacerbation (HCC) Active Problems:   Essential hypertension   Chronic diastolic heart failure (HCC)   Acute on chronic respiratory failure with hypoxia (HCC)   Depression   Acute renal failure superimposed on stage 2 chronic kidney disease (HCC)   Acute on chronic hypoxic respiratory failure: 10/1- improving some, but not close to baseline and still with dyspnea on exertion  10/2-still complaining of shortness  of breath especially with ambulation  PCCM following  Started on Lasix IV urinary We will continue to monitor D-dimer normal limits and bilateral ultrasounds were negative for DVT    Acute COPD exacerbation: Improving, still requiring oxygenation nasal cannula We will continue current management with duo nebs and prednisone Completed empiric antibiotics      AKI on CKDII: baseline Cr is 1.1-1.2. Improved, will need to monitor closely since on IV Lasix Avoid nephrotoxic medications     Acute onChronic diastolic heart failure: echo 02/05/2018 showed EF of 55 to 60% with grade 1 diastolic dysfunction. Still symptomatic. Restarted on iv lasix.  Will monitor I/o, and clinical status.    Constipation: w/ large stool burden as per XR abd. Improving, continue bowel regimen     Leukocytosis: likely secondary to steroid use. Improving.  Afebrile Monitor closely    HTN:  Stable, continue with IV hydralazine.  Will DC IV beta-blocker since he is in acute COPD exacerbation   Oral yeast- on tongue , likely due to inhalers Will start nystatin  Depression: severity unknown.  Continue on paroxetine   Morbid obesity: BMI 40.1. Would benefit from weight loss. Complicates overall care and prognosis   DVT prophylaxis: Lovenox Code Status: Full Family Communication: None at bedside  Status is: Inpatient  Remains inpatient appropriate because:Inpatient level of care appropriate due to severity of illness   Dispo: The patient is from: Home              Anticipated d/c is to: Home              Anticipated d/c date is: 1              Patient currently is not medically stable to d/c. in iv diuretics.             LOS: 6 days   Time spent: 35 minutes with more than 50% on [place], MD [facility] Pager [phone]  If 7PM-7AM, please contact night-coverage [email or web address] Password [facility] 01/05/2020, 8:23 AM

## 2020-01-05 NOTE — Progress Notes (Signed)
Pulmonary Medicine          Date: 01/05/2020,   MRN# [id number] Scott [name] [birth year]     AdmissionWeight: 99.8 kg                 CurrentWeight: 99.6 kg  Referring physician: Dr Kurtis Bushman    CHIEF COMPLAINT:   Acute Exacerbation of COPD   HISTORY OF PRESENT ILLNESS   49 year old male with history of essential hypertension, dyslipidemia lifelong smoker with advanced COPD and chronic hypoxemia using 2 L of supplemental oxygen nocturnally, also with asthma overlap as well as chronic reflux with GERD, major depression, anxiety disorder, congenital hydrocephalus heart failure with preserved ejection fraction, CKD stage II who came in 4 days ago due to progressively worsening dyspnea.  Patient reports dry cough and worsening dyspnea x1 week without flulike illness, fevers chills, denies nausea vomiting diarrhea, denies focal neurologic deficit.  When he arrived to the ED he was tested for Covid which was negative he was found to be tachycardic tachypneic mild elevation of WBC count.  He had chest x-ray done which showed mild groundglass opacification in bilateral lower lung zones as well as kyphoscoliosis.  He has been on hospitalist service and has been doing okay however was noted to be persistently hypoxemic despite supplemental oxygen and pulmonary consultation was placed for additional evaluation and management of patient with complex pulmonary history.   01/04/20- patient is improved. He was walking around room and is only mildy dyspneic with symptomatic improvement.  He is on 3L/min nasal canula and uses 2L at home which is great.  01/05/20- patient feels dyspneic at rest and worse dyspnea on ambulation slowly to commode, he is close to baseline but he does not wish to go home at this time due to feeling clinically worse then usual and states he generally does not use O2 at rest but currently needs 2L even while laying in bed, he refuses discharge this morning. I will communicate  his wishes to primary hospitalist.   PAST MEDICAL HISTORY   Past Medical History:  Diagnosis Date  . Acid reflux   . Anxiety   . Arthritis   . Asthma   . CHF (congestive heart failure) ([place])   . Depression   . Glaucoma   . Hyperlipidemia   . Hypertension   . Klippel-Feil syndrome      SURGICAL HISTORY   Past Surgical History:  Procedure Laterality Date  . COLOSTOMY    . DENVER SHUNT PLACEMENT       FAMILY HISTORY   Family History  Problem Relation Age of Onset  . Multiple sclerosis Mother   . Colon cancer Father   . Heart disease Father   . Lung cancer Maternal Grandmother   . Thyroid cancer Maternal Grandfather   . Ovarian cancer Paternal Grandmother   . Cancer Paternal Grandfather      SOCIAL HISTORY   Social History   Tobacco Use  . Smoking status: Former Smoker    Packs/day: 1.00    Years: 10.00    Pack years: 10.00    Types: Cigarettes    Quit date: 05/2006    Years since quitting: 13.6  . Smokeless tobacco: Never Used  . Tobacco comment: quit 05/2006  Vaping Use  . Vaping Use: Former  . Devices: tried once  Substance Use Topics  . Alcohol use: No    Alcohol/week: 0.0 standard drinks  . Drug use: No     MEDICATIONS  Home Medication:    Current Medication:  Current Facility-Administered Medications:  .  acetaminophen (TYLENOL) tablet 650 mg, 650 mg, Oral, Q6H PRN, Ivor Costa, MD .  acidophilus (RISAQUAD) capsule 1 capsule, 1 capsule, Oral, Daily, Ivor Costa, MD, 1 capsule at 01/05/20 0915 .  albuterol (PROVENTIL) (2.5 MG/3ML) 0.083% nebulizer solution 2.5 mg, 2.5 mg, Nebulization, Q4H PRN, Ivor Costa, MD, 2.5 mg at 01/03/20 0735 .  alum & mag hydroxide-simeth (MAALOX/MYLANTA) 200-200-20 MG/5ML suspension 30 mL, 30 mL, Oral, Q6H PRN, Ivor Costa, MD .  ascorbic acid (VITAMIN C) tablet 1,000 mg, 1,000 mg, Oral, BID, Ivor Costa, MD, 1,000 mg at 01/05/20 0913 .  B-complex with vitamin C tablet 1 tablet, 1 tablet, Oral, Daily, Ivor Costa,  MD, 1 tablet at 01/05/20 0913 .  budesonide (PULMICORT) nebulizer solution 1 mg, 1 mg, Nebulization, BID, Wyvonnia Dusky, MD, 1 mg at 01/05/20 0801 .  dextromethorphan-guaiFENesin (MUCINEX DM) 30-600 MG per 12 hr tablet 1 tablet, 1 tablet, Oral, BID PRN, Ivor Costa, MD .  docusate sodium (COLACE) capsule 200 mg, 200 mg, Oral, Daily, Wyvonnia Dusky, MD, 200 mg at 01/05/20 0913 .  enoxaparin (LOVENOX) injection 40 mg, 40 mg, Subcutaneous, BID, Ivor Costa, MD, 40 mg at 01/05/20 0913 .  furosemide (LASIX) injection 40 mg, 40 mg, Intravenous, Daily, Rajinder Mesick, MD .  hydrALAZINE (APRESOLINE) injection 5 mg, 5 mg, Intravenous, Q2H PRN, Ivor Costa, MD .  ipratropium-albuterol (DUONEB) 0.5-2.5 (3) MG/3ML nebulizer solution 3 mL, 3 mL, Nebulization, Q4H, Ivor Costa, MD, 3 mL at 01/05/20 0801 .  LORazepam (ATIVAN) tablet 1 mg, 1 mg, Oral, Once, [place], Bing Neighbors, NP .  magnesium gluconate (MAGONATE) tablet 500 mg, 500 mg, Oral, Daily, Ivor Costa, MD, 500 mg at 01/05/20 0913 .  nystatin (MYCOSTATIN) 100000 UNIT/ML suspension 500,000 Units, 5 mL, Oral, QID, Nolberto Hanlon, MD, 500,000 Units at 01/05/20 0913 .  omega-3 acid ethyl esters (LOVAZA) capsule 1 g, 1 g, Oral, Daily, Ivor Costa, MD, 1 g at 01/05/20 0913 .  ondansetron (ZOFRAN) injection 4 mg, 4 mg, Intravenous, Q8H PRN, Ivor Costa, MD .  pantoprazole (PROTONIX) EC tablet 40 mg, 40 mg, Oral, Q1200, Ivor Costa, MD, 40 mg at 01/04/20 0858 .  PARoxetine (PAXIL) tablet 40 mg, 40 mg, Oral, QHS, Wyvonnia Dusky, MD, 40 mg at 01/04/20 2237 .  polyethylene glycol (MIRALAX / GLYCOLAX) packet 17 g, 17 g, Oral, BID, Wyvonnia Dusky, MD, 17 g at 01/05/20 0913 .  predniSONE (DELTASONE) tablet 40 mg, 40 mg, Oral, Q breakfast, Kurtis Bushman, Gwynneth Albright, MD, 40 mg at 01/05/20 0913 .  senna-docusate (Senokot-S) tablet 1 tablet, 1 tablet, Oral, Daily PRN, Wyvonnia Dusky, MD    ALLERGIES   Codeine     REVIEW OF SYSTEMS    Review of  Systems:  Gen:  Denies  fever, sweats, chills weigh loss  HEENT: Denies blurred vision, double vision, ear pain, eye pain, hearing loss, nose bleeds, sore throat Cardiac:  No dizziness, chest pain or heaviness, chest tightness,edema Resp:   Denies cough or sputum porduction, shortness of breath,wheezing, hemoptysis,  Gi: Denies swallowing difficulty, stomach pain, nausea or vomiting, diarrhea, constipation, bowel incontinence Gu:  Denies bladder incontinence, burning urine Ext:   Denies Joint pain, stiffness or swelling Skin: Denies  skin rash, easy bruising or bleeding or hives Endoc:  Denies polyuria, polydipsia , polyphagia or weight change Psych:   Denies depression, insomnia or hallucinations   Other:  All other systems negative   VS: BP Marland Kitchen)  150/100 (BP Location: Right Arm)   Pulse (!) 112   Temp 98.2 F (36.8 C) (Oral)   Resp 18   Ht 5\' 2"  (1.575 m)   Wt 99.6 kg   SpO2 96%   BMI 40.16 kg/m      PHYSICAL EXAM    GENERAL:NAD, no fevers, chills, no weakness no fatigue HEAD: Normocephalic, atraumatic.  EYES: Pupils equal, round, reactive to light. Extraocular muscles intact. No scleral icterus.  MOUTH: Moist mucosal membrane. Dentition intact. No abscess noted.  EAR, NOSE, THROAT: Clear without exudates. No external lesions.  NECK: Supple. No thyromegaly. No nodules. No JVD.  PULMONARY: Diffuse coarse rhonchi right sided +wheezes CARDIOVASCULAR: S1 and S2. Regular rate and rhythm. No murmurs, rubs, or gallops. No edema. Pedal pulses 2+ bilaterally.  GASTROINTESTINAL: Soft, nontender, nondistended. No masses. Positive bowel sounds. No hepatosplenomegaly.  MUSCULOSKELETAL: No swelling, clubbing, or edema. Range of motion full in all extremities.  NEUROLOGIC: Cranial nerves II through XII are intact. No gross focal neurological deficits. Sensation intact. Reflexes intact.  SKIN: No ulceration, lesions, rashes, or cyanosis. Skin warm and dry. Turgor intact.  PSYCHIATRIC:  Mood, affect within normal limits. The patient is awake, alert and oriented x 3. Insight, judgment intact.       IMAGING    DG Chest 2 View  Result Date: 12/29/2019 CLINICAL DATA:  Chest pain, for 1 week now acutely worsening EXAM: CHEST - 2 VIEW COMPARISON:  Radiograph 12/12/2019 FINDINGS: Severe scoliotic curvature of the spine with exaggerated thoracic kyphosis and posterior fusion hardware which appears unchanged from the comparison exam. Marked chest wall deformity is again noted as well. There are increasing ill-defined opacities in the bilateral lungs which may be accentuated by this chest wall deformity. Mild vascular congestion is noted as well. Cardiomediastinal contours are similar to comparison. No pneumothorax or visible effusion. IMPRESSION: 1. Increasing ill-defined opacities in the bilateral lungs, could reflect atypical infection or edema, among other possibilities. 2. Marked chronic chest wall deformity and curvature of the spine with unchanged appearance of the fusion hardware. Electronically Signed   By: Lovena Le M.D.   On: 12/29/2019 21:13   DG Chest 2 View  Result Date: 12/12/2019 CLINICAL DATA:  Shortness of breath. Chronic CHF. Dyspnea with exertion. EXAM: CHEST - 2 VIEW COMPARISON:  Radiograph 02/20/2018. FINDINGS: Lower lung volumes from prior exam. Mild distortion of normal anatomy related to scoliosis. Stable heart size and mediastinal contours. Mild vascular congestion without overt pulmonary edema. No focal airspace disease. No pleural effusion or pneumothorax. Scoliosis with spinal fusion hardware in the thoracic spine. No acute osseous abnormalities are seen. IMPRESSION: Lower lung volumes from prior exam. Mild vascular congestion. Electronically Signed   By: Keith Rake M.D.   On: 12/12/2019 23:19   US Venous Img Lower Bilateral (DVT)  Result Date: 01/04/2020 CLINICAL DATA:  Lower extremity edema. EXAM: BILATERAL LOWER EXTREMITY VENOUS DOPPLER ULTRASOUND  TECHNIQUE: Gray-scale sonography with graded compression, as well as color Doppler and duplex ultrasound were performed to evaluate the lower extremity deep venous systems from the level of the common femoral vein and including the common femoral, femoral, profunda femoral, popliteal and calf veins including the posterior tibial, peroneal and gastrocnemius veins when visible. The superficial great saphenous vein was also interrogated. Spectral Doppler was utilized to evaluate flow at rest and with distal augmentation maneuvers in the common femoral, femoral and popliteal veins. COMPARISON:  None. FINDINGS: RIGHT LOWER EXTREMITY Common Femoral Vein: No evidence of thrombus. Normal compressibility, respiratory  phasicity and response to augmentation. Saphenofemoral Junction: No evidence of thrombus. Normal compressibility and flow on color Doppler imaging. Profunda Femoral Vein: No evidence of thrombus. Normal compressibility and flow on color Doppler imaging. Femoral Vein: No evidence of thrombus. Normal compressibility, respiratory phasicity and response to augmentation. Popliteal Vein: No evidence of thrombus. Normal compressibility, respiratory phasicity and response to augmentation. Calf Veins: No evidence of thrombus. Normal compressibility and flow on color Doppler imaging. Superficial Great Saphenous Vein: No evidence of thrombus. Normal compressibility. Venous Reflux:  None. Other Findings: No evidence of superficial thrombophlebitis or abnormal fluid collection. LEFT LOWER EXTREMITY Common Femoral Vein: No evidence of thrombus. Normal compressibility, respiratory phasicity and response to augmentation. Saphenofemoral Junction: No evidence of thrombus. Normal compressibility and flow on color Doppler imaging. Profunda Femoral Vein: No evidence of thrombus. Normal compressibility and flow on color Doppler imaging. Femoral Vein: No evidence of thrombus. Normal compressibility, respiratory phasicity and response  to augmentation. Popliteal Vein: No evidence of thrombus. Normal compressibility, respiratory phasicity and response to augmentation. Calf Veins: No evidence of thrombus. Normal compressibility and flow on color Doppler imaging. Superficial Great Saphenous Vein: No evidence of thrombus. Normal compressibility. Venous Reflux:  None. Other Findings: No evidence of superficial thrombophlebitis or abnormal fluid collection. IMPRESSION: No evidence of deep venous thrombosis in either lower extremity. Electronically Signed   By: Aletta Edouard M.D.   On: 01/04/2020 08:17   DG Chest Port 1 View  Result Date: 01/03/2020 CLINICAL DATA:  Acute on chronic respiratory failure EXAM: PORTABLE CHEST 1 VIEW COMPARISON:  12/29/2019 FINDINGS: Cardiac shadow is stable but enlarged. Postsurgical changes and scoliosis in the thoracolumbar spine are again seen and stable. The lungs are well aerated bilaterally. No focal infiltrate or sizable effusion is seen. No acute bony abnormality is noted. IMPRESSION: Chronic changes in the chest without acute abnormality. Electronically Signed   By: Inez Catalina M.D.   On: 01/03/2020 10:59   DG Abd Portable 1V  Result Date: 12/31/2019 CLINICAL DATA:  Abdominal distension. EXAM: PORTABLE ABDOMEN - 1 VIEW COMPARISON:  November 30, 2019. FINDINGS: No abnormal bowel dilatation is noted. Large amount of stool is seen throughout the colon. No radio-opaque calculi or other significant radiographic abnormality are seen. IMPRESSION: Large stool burden is noted. No evidence of bowel obstruction or ileus. Electronically Signed   By: Marijo Conception M.D.   On: 12/31/2019 18:54      ASSESSMENT/PLAN   Acute hypoxemic respiratory failure  - patient with ill defined bilateral infiltrates suggestive of atypical pneumonia which is likely causing acute exacerbation of COPD  - there is left worse then right infiltrate when comparing previous chest x rays.   - will check again for covid ( note patient  was double vaccinated in April 2021) , will also check RVP for other viral etiologies including walking pneumonia - respiratory culture to screen for bacterial pna - ferritin, CRP to reveal nonspecific active infolammatory process  - fungitell for fungal etiology   -10/1- patient is improved. Continue current care plan.   -01/05/20- patient is close to baseline but wants to stay due to still feeeling dyspnea t rest.  Ive orered more lasix this am   Assymetric lower extermity edema   - will check d-dimer for possible PE   - [place] -bilateral r/o DVT    - will hold off on CTPE protocol for now due to renal insufficiency   Moderate Acute exacerbation of COPD  - agree with empiric antibiotics   -  agree with COPD carepath    - duonebs q6h prn    - prednisone   Left lower lung zone atelectasis  will order metaneb with saline   - patient is already using incentive spirometry     Thank you for allowing me to participate in the care of this patient.   Patient/Family are satisfied with care plan and all questions have been answered.   This document was prepared using Dragon voice recognition software and may include unintentional dictation errors.     Ottie Glazier, M.D.  Division of [place]

## 2020-01-05 NOTE — TOC Transition Note (Signed)
Transition of Care [facility]) - CM/SW Discharge Note   Patient Details  Name: Scott [name] MRN: [id number] Date of Birth: [birth year]  [facility] CM/SW Contact:  Boris Sharper, LCSW Phone Number: 01/05/2020, 12:27 PM   Clinical Narrative:    Pt medically stable for discharge per MD. Pt will be transporting himself home. Pt will be followed by [facility] for PT, OT, Nurse Aide, RN and Social Work. Pt expressed concerns with his living conditions, and he said that his sister is going to help him get an exterminator and pay for him to stay in a hotel during the process.   Final next level of care: [place] Barriers to Discharge: Equipment Delay   Patient Goals and CMS Choice   CMS Medicare.gov Compare Post Acute Care list provided to:: Patient Choice offered to / list presented to : Patient  Discharge Placement                Patient to be transferred to facility by: [place] Name of family member notified: Renaldo Harrison- Fiance Patient and family notified of of transfer: 01/05/20  Discharge Plan and Services     Post Acute Care Choice: Home Health            DME Agency: Other - Comment ([facility]) Date DME Agency Contacted: 01/05/20 Time DME Agency Contacted: [id number] Representative spoke with at DME Agency: Melene Muller HH Arranged: PT, OT, Nurse's Aide, Social Work, Therapist, sports HH Agency: [place] ([place]) Date [place]: 01/05/20 Time [place]: 1057 Representative spoke with at [place]: [place] ([place]) Interventions     Readmission Risk Interventions No flowsheet data found.

## 2020-01-05 NOTE — Progress Notes (Signed)
   01/05/20 0853  Assess: MEWS Score  Temp 98.2 F (36.8 C)  BP (!) 150/100  Pulse Rate (!) 112  Resp 18  SpO2 96 %  O2 Device Room Air  Assess: MEWS Score  MEWS Temp 0  MEWS Systolic 0  MEWS Pulse 2  MEWS RR 0  MEWS LOC 0  MEWS Score 2  MEWS Score Color Yellow  Assess: if the MEWS score is Yellow or Red  Were vital signs taken at a resting state? Yes  Focused Assessment No change from prior assessment  Early Detection of Sepsis Score *See Row Information* Medium  MEWS guidelines implemented *See Row Information* Yes  Take Vital Signs  Increase Vital Sign Frequency  Yellow: Q 2hr X 2 then Q 4hr X 2, if remains yellow, continue Q 4hrs  Escalate  MEWS: Escalate Yellow: discuss with charge nurse/RN and consider discussing with provider and RRT

## 2020-01-05 NOTE — Progress Notes (Signed)
   01/05/20 1012  Notify: Charge Nurse/RN  Name of Charge Nurse/RN Notified Tam RN  Date Charge Nurse/RN Notified 01/05/20  Time Charge Nurse/RN Notified 1012  Notify: Provider  Provider Name/Title Amery MD  Date Provider Notified 01/05/20  Time Provider Notified 2025  Notification Type Page  Notification Reason Other (Comment) (BP up HR 112)  Document  Patient Outcome Other (Comment) (stable)  Progress note created (see row info) Yes

## 2020-01-06 DIAGNOSIS — J441 Chronic obstructive pulmonary disease with (acute) exacerbation: Secondary | ICD-10-CM | POA: Diagnosis not present

## 2020-01-06 LAB — CBC
HCT: 42.8 % (ref 39.0–52.0)
Hemoglobin: 13.6 g/dL (ref 13.0–17.0)
MCH: 31.1 pg (ref 26.0–34.0)
MCHC: 31.8 g/dL (ref 30.0–36.0)
MCV: 97.7 fL (ref 80.0–100.0)
Platelets: 281 10*3/uL (ref 150–400)
RBC: 4.38 MIL/uL (ref 4.22–5.81)
RDW: 13.8 % (ref 11.5–15.5)
WBC: 15.5 10*3/uL — ABNORMAL HIGH (ref 4.0–10.5)
nRBC: 0 % (ref 0.0–0.2)

## 2020-01-06 LAB — BASIC METABOLIC PANEL
Anion gap: 10 (ref 5–15)
BUN: 26 mg/dL — ABNORMAL HIGH (ref 6–20)
CO2: 40 mmol/L — ABNORMAL HIGH (ref 22–32)
Calcium: 9.2 mg/dL (ref 8.9–10.3)
Chloride: 90 mmol/L — ABNORMAL LOW (ref 98–111)
Creatinine, Ser: 0.94 mg/dL (ref 0.61–1.24)
GFR calc Af Amer: >60 mL/min (ref >60)
GFR calc non Af Amer: >60 mL/min (ref >60)
Glucose, Bld: 140 mg/dL — ABNORMAL HIGH (ref 70–99)
Potassium: 3.6 mmol/L (ref 3.5–5.1)
Sodium: 140 mmol/L (ref 135–145)

## 2020-01-06 MED ORDER — DM-GUAIFENESIN ER 30-600 MG PO TB12: 1.0000 | ORAL_TABLET | Freq: Two times a day (BID) | ORAL | 0 refills | Status: AC | PRN

## 2020-01-06 MED ORDER — MAGNESIUM GLUCONATE 500 MG PO TABS: 500.0000 mg | ORAL_TABLET | Freq: Every day | ORAL | 0 refills | Status: DC

## 2020-01-06 MED ORDER — NYSTATIN 100000 UNIT/ML MT SUSP: 5.0000 mL | Freq: Four times a day (QID) | OROMUCOSAL | 0 refills | Status: DC

## 2020-01-06 MED ORDER — PREDNISONE 20 MG PO TABS
20.0000 mg | ORAL_TABLET | Freq: Every day | ORAL | Status: DC
  Administered 2020-01-06: 20 mg via ORAL
  Filled 2020-01-06: qty 1

## 2020-01-06 MED ORDER — PANTOPRAZOLE SODIUM 40 MG PO TBEC
40.0000 mg | DELAYED_RELEASE_TABLET | Freq: Every day | ORAL | 0 refills | Status: DC
Start: 2020-01-06 — End: 2020-01-10

## 2020-01-06 NOTE — Progress Notes (Signed)
Pulmonary Medicine          Date: 01/06/2020,   MRN# [id number] Scott [name] Jan 31, 1971     AdmissionWeight: 99.8 kg                 CurrentWeight: 99.6 kg  Referring physician: Dr Kurtis Bushman    CHIEF COMPLAINT:   Acute Exacerbation of COPD   HISTORY OF PRESENT ILLNESS   49 year old male with history of essential hypertension, dyslipidemia lifelong smoker with advanced COPD and chronic hypoxemia using 2 L of supplemental oxygen nocturnally, also with asthma overlap as well as chronic reflux with GERD, major depression, anxiety disorder, congenital hydrocephalus heart failure with preserved ejection fraction, CKD stage II who came in 4 days ago due to progressively worsening dyspnea.  Patient reports dry cough and worsening dyspnea x1 week without flulike illness, fevers chills, denies nausea vomiting diarrhea, denies focal neurologic deficit.  When he arrived to the ED he was tested for Covid which was negative he was found to be tachycardic tachypneic mild elevation of WBC count.  He had chest x-ray done which showed mild groundglass opacification in bilateral lower lung zones as well as kyphoscoliosis.  He has been on hospitalist service and has been doing okay however was noted to be persistently hypoxemic despite supplemental oxygen and pulmonary consultation was placed for additional evaluation and management of patient with complex pulmonary history.   01/04/20- patient is improved. He was walking around room and is only mildy dyspneic with symptomatic improvement.  He is on 3L/min nasal canula and uses 2L at home which is great.  01/05/20- patient feels dyspneic at rest and worse dyspnea on ambulation slowly to commode, he is close to baseline but he does not wish to go home at this time due to feeling clinically worse then usual and states he generally does not use O2 at rest but currently needs 2L even while laying in bed, he refuses discharge this morning. I will communicate  his wishes to primary hospitalist.  01/06/20- patient is resting in bed.  He is speaking in full sentences with non labored breating on 1.5L supplemental O2.  He was able to have exertional walk with O2 qualification and found to have spO2>90 % on 2L/min supplemental O2.  He is established with Dr Lucina Mellow pulmonary and has appt to see her.   PAST MEDICAL HISTORY   Past Medical History:  Diagnosis Date  . Acid reflux   . Anxiety   . Arthritis   . Asthma   . CHF (congestive heart failure) ([place])   . Depression   . Glaucoma   . Hyperlipidemia   . Hypertension   . Klippel-Feil syndrome      SURGICAL HISTORY   Past Surgical History:  Procedure Laterality Date  . COLOSTOMY    . [place] SHUNT PLACEMENT       FAMILY HISTORY   Family History  Problem Relation Age of Onset  . Multiple sclerosis Mother   . Colon cancer Father   . Heart disease Father   . Lung cancer Maternal Grandmother   . Thyroid cancer Maternal Grandfather   . Ovarian cancer Paternal Grandmother   . Cancer Paternal Grandfather      SOCIAL HISTORY   Social History   Tobacco Use  . Smoking status: Former Smoker    Packs/day: 1.00    Years: 10.00    Pack years: 10.00    Types: Cigarettes    Quit date: 05/2006  Years since quitting: 13.6  . Smokeless tobacco: Never Used  . Tobacco comment: quit 05/2006  Vaping Use  . Vaping Use: Former  . Devices: tried once  Substance Use Topics  . Alcohol use: No    Alcohol/week: 0.0 standard drinks  . Drug use: No     MEDICATIONS    Home Medication:    Current Medication:  Current Facility-Administered Medications:  .  acetaminophen (TYLENOL) tablet 650 mg, 650 mg, Oral, Q6H PRN, Ivor Costa, MD .  acidophilus (RISAQUAD) capsule 1 capsule, 1 capsule, Oral, Daily, Ivor Costa, MD, 1 capsule at 01/05/20 0915 .  albuterol (PROVENTIL) (2.5 MG/3ML) 0.083% nebulizer solution 2.5 mg, 2.5 mg, Nebulization, Q4H PRN, Ivor Costa, MD, 2.5 mg at 01/03/20  0735 .  alum & mag hydroxide-simeth (MAALOX/MYLANTA) 200-200-20 MG/5ML suspension 30 mL, 30 mL, Oral, Q6H PRN, Ivor Costa, MD .  ascorbic acid (VITAMIN C) tablet 1,000 mg, 1,000 mg, Oral, BID, Ivor Costa, MD, 1,000 mg at 01/05/20 2206 .  B-complex with vitamin C tablet 1 tablet, 1 tablet, Oral, Daily, Ivor Costa, MD, 1 tablet at 01/05/20 0913 .  budesonide (PULMICORT) nebulizer solution 1 mg, 1 mg, Nebulization, BID, Wyvonnia Dusky, MD, 1 mg at 01/06/20 0739 .  dextromethorphan-guaiFENesin (MUCINEX DM) 30-600 MG per 12 hr tablet 1 tablet, 1 tablet, Oral, BID PRN, Ivor Costa, MD .  docusate sodium (COLACE) capsule 200 mg, 200 mg, Oral, Daily, Wyvonnia Dusky, MD, 200 mg at 01/05/20 0913 .  enoxaparin (LOVENOX) injection 40 mg, 40 mg, Subcutaneous, BID, Ivor Costa, MD, 40 mg at 01/05/20 2206 .  hydrALAZINE (APRESOLINE) injection 5 mg, 5 mg, Intravenous, Q2H PRN, Ivor Costa, MD .  ipratropium-albuterol (DUONEB) 0.5-2.5 (3) MG/3ML nebulizer solution 3 mL, 3 mL, Nebulization, Q6H, Kurtis Bushman, Sahar, MD, 3 mL at 01/06/20 0739 .  LORazepam (ATIVAN) tablet 1 mg, 1 mg, Oral, Once, [place], Bing Neighbors, NP .  magnesium gluconate (MAGONATE) tablet 500 mg, 500 mg, Oral, Daily, Ivor Costa, MD, 500 mg at 01/05/20 0913 .  nystatin (MYCOSTATIN) 100000 UNIT/ML suspension 500,000 Units, 5 mL, Oral, QID, Nolberto Hanlon, MD, 500,000 Units at 01/05/20 2206 .  omega-3 acid ethyl esters (LOVAZA) capsule 1 g, 1 g, Oral, Daily, Ivor Costa, MD, 1 g at 01/05/20 0913 .  ondansetron (ZOFRAN) injection 4 mg, 4 mg, Intravenous, Q8H PRN, Ivor Costa, MD .  pantoprazole (PROTONIX) EC tablet 40 mg, 40 mg, Oral, Q1200, Ivor Costa, MD, 40 mg at 01/05/20 1111 .  PARoxetine (PAXIL) tablet 40 mg, 40 mg, Oral, QHS, Wyvonnia Dusky, MD, 40 mg at 01/05/20 2206 .  polyethylene glycol (MIRALAX / GLYCOLAX) packet 17 g, 17 g, Oral, BID, Wyvonnia Dusky, MD, 17 g at 01/05/20 0913 .  predniSONE (DELTASONE) tablet 20 mg, 20 mg, Oral,  Q breakfast, Kurtis Bushman, Sahar, MD .  senna-docusate (Senokot-S) tablet 1 tablet, 1 tablet, Oral, Daily PRN, Wyvonnia Dusky, MD    ALLERGIES   Codeine     REVIEW OF SYSTEMS    Review of Systems:  Gen:  Denies  fever, sweats, chills weigh loss  HEENT: Denies blurred vision, double vision, ear pain, eye pain, hearing loss, nose bleeds, sore throat Cardiac:  No dizziness, chest pain or heaviness, chest tightness,edema Resp:   Denies cough or sputum porduction, shortness of breath,wheezing, hemoptysis,  Gi: Denies swallowing difficulty, stomach pain, nausea or vomiting, diarrhea, constipation, bowel incontinence Gu:  Denies bladder incontinence, burning urine Ext:   Denies Joint pain, stiffness or swelling Skin: Denies  skin rash, easy bruising or bleeding or hives Endoc:  Denies polyuria, polydipsia , polyphagia or weight change Psych:   Denies depression, insomnia or hallucinations   Other:  All other systems negative   VS: BP 107/82 (BP Location: Right Arm)   Pulse 97   Temp 98 F (36.7 C)   Resp 16   Ht 5\' 2"  (1.575 m)   Wt 99.6 kg   SpO2 99%   BMI 40.16 kg/m      PHYSICAL EXAM    GENERAL:NAD, no fevers, chills, no weakness no fatigue HEAD: Normocephalic, atraumatic.  EYES: Pupils equal, round, reactive to light. Extraocular muscles intact. No scleral icterus.  MOUTH: Moist mucosal membrane. Dentition intact. No abscess noted.  EAR, NOSE, THROAT: Clear without exudates. No external lesions.  NECK: Supple. No thyromegaly. No nodules. No JVD.  PULMONARY: Diffuse coarse rhonchi right sided +wheezes CARDIOVASCULAR: S1 and S2. Regular rate and rhythm. No murmurs, rubs, or gallops. No edema. Pedal pulses 2+ bilaterally.  GASTROINTESTINAL: Soft, nontender, nondistended. No masses. Positive bowel sounds. No hepatosplenomegaly.  MUSCULOSKELETAL: No swelling, clubbing, or edema. Range of motion full in all extremities.  NEUROLOGIC: Cranial nerves II through XII are  intact. No gross focal neurological deficits. Sensation intact. Reflexes intact.  SKIN: No ulceration, lesions, rashes, or cyanosis. Skin warm and dry. Turgor intact.  PSYCHIATRIC: Mood, affect within normal limits. The patient is awake, alert and oriented x 3. Insight, judgment intact.       IMAGING    DG Chest 2 View  Result Date: 12/29/2019 CLINICAL DATA:  Chest pain, for 1 week now acutely worsening EXAM: CHEST - 2 VIEW COMPARISON:  Radiograph 12/12/2019 FINDINGS: Severe scoliotic curvature of the spine with exaggerated thoracic kyphosis and posterior fusion hardware which appears unchanged from the comparison exam. Marked chest wall deformity is again noted as well. There are increasing ill-defined opacities in the bilateral lungs which may be accentuated by this chest wall deformity. Mild vascular congestion is noted as well. Cardiomediastinal contours are similar to comparison. No pneumothorax or visible effusion. IMPRESSION: 1. Increasing ill-defined opacities in the bilateral lungs, could reflect atypical infection or edema, among other possibilities. 2. Marked chronic chest wall deformity and curvature of the spine with unchanged appearance of the fusion hardware. Electronically Signed   By: Lovena Le M.D.   On: 12/29/2019 21:13   DG Chest 2 View  Result Date: 12/12/2019 CLINICAL DATA:  Shortness of breath. Chronic CHF. Dyspnea with exertion. EXAM: CHEST - 2 VIEW COMPARISON:  Radiograph 02/20/2018. FINDINGS: Lower lung volumes from prior exam. Mild distortion of normal anatomy related to scoliosis. Stable heart size and mediastinal contours. Mild vascular congestion without overt pulmonary edema. No focal airspace disease. No pleural effusion or pneumothorax. Scoliosis with spinal fusion hardware in the thoracic spine. No acute osseous abnormalities are seen. IMPRESSION: Lower lung volumes from prior exam. Mild vascular congestion. Electronically Signed   By: Keith Rake M.D.   On:  12/12/2019 23:19   US Venous Img Lower Bilateral (DVT)  Result Date: 01/04/2020 CLINICAL DATA:  Lower extremity edema. EXAM: BILATERAL LOWER EXTREMITY VENOUS DOPPLER ULTRASOUND TECHNIQUE: Gray-scale sonography with graded compression, as well as color Doppler and duplex ultrasound were performed to evaluate the lower extremity deep venous systems from the level of the common femoral vein and including the common femoral, femoral, profunda femoral, popliteal and calf veins including the posterior tibial, peroneal and gastrocnemius veins when visible. The superficial great saphenous vein was also interrogated. Spectral Doppler was  utilized to evaluate flow at rest and with distal augmentation maneuvers in the common femoral, femoral and popliteal veins. COMPARISON:  None. FINDINGS: RIGHT LOWER EXTREMITY Common Femoral Vein: No evidence of thrombus. Normal compressibility, respiratory phasicity and response to augmentation. Saphenofemoral Junction: No evidence of thrombus. Normal compressibility and flow on color Doppler imaging. Profunda Femoral Vein: No evidence of thrombus. Normal compressibility and flow on color Doppler imaging. Femoral Vein: No evidence of thrombus. Normal compressibility, respiratory phasicity and response to augmentation. Popliteal Vein: No evidence of thrombus. Normal compressibility, respiratory phasicity and response to augmentation. Calf Veins: No evidence of thrombus. Normal compressibility and flow on color Doppler imaging. Superficial Great Saphenous Vein: No evidence of thrombus. Normal compressibility. Venous Reflux:  None. Other Findings: No evidence of superficial thrombophlebitis or abnormal fluid collection. LEFT LOWER EXTREMITY Common Femoral Vein: No evidence of thrombus. Normal compressibility, respiratory phasicity and response to augmentation. Saphenofemoral Junction: No evidence of thrombus. Normal compressibility and flow on color Doppler imaging. Profunda Femoral Vein:  No evidence of thrombus. Normal compressibility and flow on color Doppler imaging. Femoral Vein: No evidence of thrombus. Normal compressibility, respiratory phasicity and response to augmentation. Popliteal Vein: No evidence of thrombus. Normal compressibility, respiratory phasicity and response to augmentation. Calf Veins: No evidence of thrombus. Normal compressibility and flow on color Doppler imaging. Superficial Great Saphenous Vein: No evidence of thrombus. Normal compressibility. Venous Reflux:  None. Other Findings: No evidence of superficial thrombophlebitis or abnormal fluid collection. IMPRESSION: No evidence of deep venous thrombosis in either lower extremity. Electronically Signed   By: Aletta Edouard M.D.   On: 01/04/2020 08:17   DG Chest Port 1 View  Result Date: 01/03/2020 CLINICAL DATA:  Acute on chronic respiratory failure EXAM: PORTABLE CHEST 1 VIEW COMPARISON:  12/29/2019 FINDINGS: Cardiac shadow is stable but enlarged. Postsurgical changes and scoliosis in the thoracolumbar spine are again seen and stable. The lungs are well aerated bilaterally. No focal infiltrate or sizable effusion is seen. No acute bony abnormality is noted. IMPRESSION: Chronic changes in the chest without acute abnormality. Electronically Signed   By: Inez Catalina M.D.   On: 01/03/2020 10:59   DG Abd Portable 1V  Result Date: 12/31/2019 CLINICAL DATA:  Abdominal distension. EXAM: PORTABLE ABDOMEN - 1 VIEW COMPARISON:  November 30, 2019. FINDINGS: No abnormal bowel dilatation is noted. Large amount of stool is seen throughout the colon. No radio-opaque calculi or other significant radiographic abnormality are seen. IMPRESSION: Large stool burden is noted. No evidence of bowel obstruction or ileus. Electronically Signed   By: Marijo Conception M.D.   On: 12/31/2019 18:54      ASSESSMENT/PLAN   Acute hypoxemic respiratory failure  - patient with ill defined bilateral infiltrates suggestive of atypical pneumonia  which is likely causing acute exacerbation of COPD  - there is left worse then right infiltrate when comparing previous chest x rays.   - will check again for covid ( note patient was double vaccinated in April 2021) , will also check RVP for other viral etiologies including walking pneumonia - respiratory culture to screen for bacterial pna - ferritin, CRP to reveal nonspecific active infolammatory process  - fungitell for fungal etiology   -10/1- patient is improved. Continue current care plan.   -01/05/20- patient is close to baseline but wants to stay due to still feeeling dyspnea t rest.  Ive orered more lasix this am -01/06/20- patient may be d/cd on pred 30 taper by 5mg  daily and zithromax 250 daily x  5 d- follow up with Dr Duwayne Heck  Assymetric lower extermity edema   - will check d-dimer for possible PE   - [place] -bilateral r/o DVT    - will hold off on CTPE protocol for now due to renal insufficiency   Moderate Acute exacerbation of COPD  - agree with empiric antibiotics   - agree with COPD carepath    - duonebs q6h prn    - prednisone   Left lower lung zone atelectasis  will order metaneb with saline   - patient is already using incentive spirometry     Thank you for allowing me to participate in the care of this patient.   Patient/Family are satisfied with care plan and all questions have been answered.   This document was prepared using Dragon voice recognition software and may include unintentional dictation errors.     Ottie Glazier, M.D.  Division of [facility]

## 2020-01-06 NOTE — Discharge Summary (Signed)
DEDRICK [name] [name]:[id number] DOB: 1970/05/24 DOA: 12/29/2019  PCP: Margo Common, PA  Admit date: 12/29/2019 Discharge date: 01/06/2020  Admitted From: Home Disposition: Home  Recommendations for Outpatient Follow-up:  1. Follow up with PCP in 1 week 2. Please obtain BMP/CBC in one week Follow-up with his pulmonologist Dr. Patsey Berthold in 1 week Follow-up with Dr. Rockey Situ cardiology in 1 week  Home Health: Yes   Discharge Condition:Stable CODE STATUS: Full Diet recommendation: Heart Healthy  Brief/Interim Summary:  Scott [name] is a 49 y.o. male with medical history significant of hypertension, hyperlipidemia, COPD (on 2 L of oxygen only at night), asthma, GERD, depression, anxiety, Klippel-Feil syndrome, congenital hydrocephalus, dCHF, former smoker, CKD stage II, who presented with shortness of breath.  He was admitted for COPD exacerbation.  During his hospitalization he was also volume overloaded while on steroid therapy.  He did receive IV Lasix with improvement.  Acute on chronic hypoxic respiratory failure: Due to acute COPD exacerbation.  He did have some acute on chronic diastolic heart failure also. Started on IV Lasix with improvement D-dimer normal limits and bilateral ultrasounds were negative for DVT At baseline oxygenation 2 -3 liters.  On ambulation he was satting at 95% on 2 L    AcuteCOPD exacerbation: Improved with antibiotics and prednisone. Prednisone tapered off Continue home inhalers on discharge Patient was given home oxygen upon discharge Also social work set up a new concentrator to be taken to his house today   AKI on CKDII: baseline Cr is 1.1-1.2. Improved Avoid nephrotoxic medications     Acute onChronic diastolic heart failure: echo 02/05/2018 showed EF of 55 to 60% with grade 1 diastolic dysfunction. Improved with IV Lasix.  Currently euvolemic. Needs to follow-up with cardiology as outpatient   Constipation: w/ large stool  burden as per XR abd. Improving, continue bowel regimen     Leukocytosis: likely secondary to steroid use. Afebrile and improving.   HTN:  Stable continue home meds   oral yeast- on tongue , likely due to inhalers On nystatin  Depression: severity unknown. Continue on paroxetine   Morbid obesity: BMI 40.1. Would benefit from weight loss. Complicates overall care and prognosis   Discharge Diagnoses:  Principal Problem:   COPD exacerbation (Scott [name]) Active Problems:   Essential hypertension   Chronic diastolic heart failure (HCC)   Acute on chronic respiratory failure with hypoxia (HCC)   Depression   Acute renal failure superimposed on stage 2 chronic kidney disease [facility])    Discharge Instructions  Discharge Instructions    Call MD for:  temperature >100.4   Complete by: As directed    Diet - low sodium heart healthy   Complete by: As directed    Increase activity slowly   Complete by: As directed      Allergies as of 01/06/2020      Reactions   Codeine Shortness Of Breath      Medication List    TAKE these medications   albuterol 108 (90 Base) MCG/ACT inhaler Commonly known as: VENTOLIN HFA Inhale 2 puffs into the lungs every 4 (four) hours as needed for wheezing or shortness of breath.   AndroGel Pump 20.25 MG/ACT (1.62%) Gel Generic drug: Testosterone Apply 1 application topically daily.   Azopt 1 % ophthalmic suspension Generic drug: brinzolamide Place 1 drop into both eyes daily.   b complex vitamins capsule Take 1 capsule by mouth daily.   dextromethorphan-guaiFENesin 30-600 MG 12hr tablet Commonly known as: MUCINEX DM Take 1  tablet by mouth 2 (two) times daily as needed for up to 3 days for cough.   glucosamine-chondroitin 500-400 MG tablet Take 2 tablets by mouth daily.   Magnesium 500 MG Tabs Take 1 tablet by mouth daily. Patient says he takes magnesium orotate   magnesium gluconate 500 MG tablet Commonly known as:  MAGONATE Take 1 tablet (500 mg total) by mouth daily. Start taking on: January 07, 2020   metolazone 2.5 MG tablet Commonly known as: ZAROXOLYN Take 1 tablet (2.5 mg total) by mouth 3 (three) times a week.   nystatin 100000 UNIT/ML suspension Commonly known as: MYCOSTATIN Take 5 mLs (500,000 Units total) by mouth 4 (four) times daily.   Omega Essentials Basic Liqd Take 15 mLs by mouth daily.   pantoprazole 40 MG tablet Commonly known as: PROTONIX Take 1 tablet (40 mg total) by mouth daily at 12 noon.   PARoxetine 40 MG tablet Commonly known as: Paxil Take 1 tablet (40 mg total) by mouth daily.   potassium chloride SA 20 MEQ tablet Commonly known as: KLOR-CON TAKE 2 TABLETS(40 MEQ) BY MOUTH TWICE DAILY   PROBIOTIC ACIDOPHILUS PO Take by mouth daily.   Spacer/Aero-Holding Dorise Bullion 1 Device by Does not apply route 4 (four) times daily.   spironolactone 50 MG tablet Commonly known as: ALDACTONE Take 50 mg by mouth daily.   torsemide 20 MG tablet Commonly known as: DEMADEX Take 3 tablets (60 mg total) by mouth 2 (two) times daily.   vitamin C 1000 MG tablet Take 1,000 mg by mouth 2 (two) times a day.   Xalatan 0.005 % ophthalmic solution Generic drug: latanoprost Place 1 drop into both eyes at bedtime.            Durable Medical Equipment  (From admission, onward)         Start     Ordered   01/05/20 1029  For home use only DME oxygen  Once       Question Answer Comment  Length of Need Lifetime   Mode or (Route) Nasal cannula   Liters per Minute 3   Frequency Continuous (stationary and portable oxygen unit needed)   Oxygen conserving device Yes   Oxygen delivery system Gas      01/05/20 1028          Follow-up Information    [name], [name], PA. Schedule an appointment as soon as possible for a visit in 2 days.   Specialty: Family Medicine Contact information: [address] [place] [address] [phone]        Minna Merritts, MD Follow up in 1 week(s).   Specialty: Cardiology Contact information: [place] [address] [phone]        Collier Bullock, MD Follow up in 1 week(s).   Specialty: Pulmonary Disease Contact information: [address] [place] [address] [phone]              Allergies  Allergen Reactions  . Codeine Shortness Of Breath    Consultations:  PCCM-Dr. Fanny Dance   Procedures/Studies: DG Chest 2 View  Result Date: 12/29/2019 CLINICAL DATA:  Chest pain, for 1 week now acutely worsening EXAM: CHEST - 2 VIEW COMPARISON:  Radiograph 12/12/2019 FINDINGS: Severe scoliotic curvature of the spine with exaggerated thoracic kyphosis and posterior fusion hardware which appears unchanged from the comparison exam. Marked chest wall deformity is again noted as well. There are increasing ill-defined opacities in the  bilateral lungs which may be accentuated by this chest wall deformity. Mild vascular congestion is noted as well. Cardiomediastinal contours are similar to comparison. No pneumothorax or visible effusion. IMPRESSION: 1. Increasing ill-defined opacities in the bilateral lungs, could reflect atypical infection or edema, among other possibilities. 2. Marked chronic chest wall deformity and curvature of the spine with unchanged appearance of the fusion hardware. Electronically Signed   By: Lovena Le M.D.   On: 12/29/2019 21:13   DG Chest 2 View  Result Date: 12/12/2019 CLINICAL DATA:  Shortness of breath. Chronic CHF. Dyspnea with exertion. EXAM: CHEST - 2 VIEW COMPARISON:  Radiograph 02/20/2018. FINDINGS: Lower lung volumes from prior exam. Mild distortion of normal anatomy related to scoliosis. Stable heart size and mediastinal contours. Mild vascular congestion without overt pulmonary edema. No focal airspace disease. No pleural effusion or pneumothorax. Scoliosis with spinal fusion hardware in the thoracic spine. No  acute osseous abnormalities are seen. IMPRESSION: Lower lung volumes from prior exam. Mild vascular congestion. Electronically Signed   By: Keith Rake M.D.   On: 12/12/2019 23:19   US Venous Img Lower Bilateral (DVT)  Result Date: 01/04/2020 CLINICAL DATA:  Lower extremity edema. EXAM: BILATERAL LOWER EXTREMITY VENOUS DOPPLER ULTRASOUND TECHNIQUE: Gray-scale sonography with graded compression, as well as color Doppler and duplex ultrasound were performed to evaluate the lower extremity deep venous systems from the level of the common femoral vein and including the common femoral, femoral, profunda femoral, popliteal and calf veins including the posterior tibial, peroneal and gastrocnemius veins when visible. The superficial great saphenous vein was also interrogated. Spectral Doppler was utilized to evaluate flow at rest and with distal augmentation maneuvers in the common femoral, femoral and popliteal veins. COMPARISON:  None. FINDINGS: RIGHT LOWER EXTREMITY Common Femoral Vein: No evidence of thrombus. Normal compressibility, respiratory phasicity and response to augmentation. Saphenofemoral Junction: No evidence of thrombus. Normal compressibility and flow on color Doppler imaging. Profunda Femoral Vein: No evidence of thrombus. Normal compressibility and flow on color Doppler imaging. Femoral Vein: No evidence of thrombus. Normal compressibility, respiratory phasicity and response to augmentation. Popliteal Vein: No evidence of thrombus. Normal compressibility, respiratory phasicity and response to augmentation. Calf Veins: No evidence of thrombus. Normal compressibility and flow on color Doppler imaging. Superficial Great Saphenous Vein: No evidence of thrombus. Normal compressibility. Venous Reflux:  None. Other Findings: No evidence of superficial thrombophlebitis or abnormal fluid collection. LEFT LOWER EXTREMITY Common Femoral Vein: No evidence of thrombus. Normal compressibility, respiratory  phasicity and response to augmentation. Saphenofemoral Junction: No evidence of thrombus. Normal compressibility and flow on color Doppler imaging. Profunda Femoral Vein: No evidence of thrombus. Normal compressibility and flow on color Doppler imaging. Femoral Vein: No evidence of thrombus. Normal compressibility, respiratory phasicity and response to augmentation. Popliteal Vein: No evidence of thrombus. Normal compressibility, respiratory phasicity and response to augmentation. Calf Veins: No evidence of thrombus. Normal compressibility and flow on color Doppler imaging. Superficial Great Saphenous Vein: No evidence of thrombus. Normal compressibility. Venous Reflux:  None. Other Findings: No evidence of superficial thrombophlebitis or abnormal fluid collection. IMPRESSION: No evidence of deep venous thrombosis in either lower extremity. Electronically Signed   By: Aletta Edouard M.D.   On: 01/04/2020 08:17   DG Chest Port 1 View  Result Date: 01/03/2020 CLINICAL DATA:  Acute on chronic respiratory failure EXAM: PORTABLE CHEST 1 VIEW COMPARISON:  12/29/2019 FINDINGS: Cardiac shadow is stable but enlarged. Postsurgical changes and scoliosis in the thoracolumbar spine are again seen and stable.  The lungs are well aerated bilaterally. No focal infiltrate or sizable effusion is seen. No acute bony abnormality is noted. IMPRESSION: Chronic changes in the chest without acute abnormality. Electronically Signed   By: Inez Catalina M.D.   On: 01/03/2020 10:59   DG Abd Portable 1V  Result Date: 12/31/2019 CLINICAL DATA:  Abdominal distension. EXAM: PORTABLE ABDOMEN - 1 VIEW COMPARISON:  November 30, 2019. FINDINGS: No abnormal bowel dilatation is noted. Large amount of stool is seen throughout the colon. No radio-opaque calculi or other significant radiographic abnormality are seen. IMPRESSION: Large stool burden is noted. No evidence of bowel obstruction or ileus. Electronically Signed   By: Marijo Conception M.D.    On: 12/31/2019 18:54       Subjective: No new complaints.   Discharge Exam: Vitals:   01/06/20 0728 01/06/20 0739  BP: 107/82   Pulse: 99 97  Resp: 18 16  Temp: 98 F (36.7 C)   SpO2: 99% 99%   Vitals:   01/05/20 2316 01/06/20 0212 01/06/20 0728 01/06/20 0739  BP: (!) 135/95  107/82   Pulse: (!) 102  99 97  Resp: 20  18 16   Temp: 98.5 F (36.9 C)  98 F (36.7 C)   TempSrc: Oral     SpO2: 98% 96% 99% 99%  Weight:      Height:        General: Pt is alert, awake, not in acute distress Cardiovascular: RRR, S1/S2 +, no rubs, no gallops Respiratory: CTA bilaterally, no wheezing, no rhonchi Abdominal: Soft, NT, ND, bowel sounds + Extremities:mild chronic edema b/l L>R    The results of significant diagnostics from this hospitalization (including imaging, microbiology, ancillary and laboratory) are listed below for reference.     Microbiology: Recent Results (from the past 240 hour(s))  Respiratory Panel by RT PCR (Flu A&B, Covid) - Nasopharyngeal Swab     Status: None   Collection Time: 12/30/19  3:08 AM   Specimen: Nasopharyngeal Swab  Result Value Ref Range Status   SARS Coronavirus 2 by RT PCR NEGATIVE NEGATIVE Final    Comment: (NOTE) SARS-CoV-2 target nucleic acids are NOT DETECTED.  The SARS-CoV-2 RNA is generally detectable in upper respiratoy specimens during the acute phase of infection. The lowest concentration of SARS-CoV-2 viral copies this assay can detect is 131 copies/mL. A negative result does not preclude SARS-Cov-2 infection and should not be used as the sole basis for treatment or other patient management decisions. A negative result may occur with  improper specimen collection/handling, submission of specimen other than nasopharyngeal swab, presence of viral mutation(s) within the areas targeted by this assay, and inadequate number of viral copies (<131 copies/mL). A negative result must be combined with clinical observations, patient  history, and epidemiological information. The expected result is Negative.  Fact Sheet for Patients:  PinkCheek.be  Fact Sheet for Healthcare Providers:  GravelBags.it  This test is no t yet approved or cleared by the [place] FDA and  has been authorized for detection and/or diagnosis of SARS-CoV-2 by FDA under an Emergency Use Authorization (EUA). This EUA will remain  in effect (meaning this test can be used) for the duration of the COVID-19 declaration under Section 564(b)(1) of the Act, 21 U.S.C. section 360bbb-3(b)(1), unless the authorization is terminated or revoked sooner.     Influenza A by PCR NEGATIVE NEGATIVE Final   Influenza B by PCR NEGATIVE NEGATIVE Final    Comment: (NOTE) The Xpert Xpress SARS-CoV-2/FLU/RSV assay is intended  as an aid in  the diagnosis of influenza from Nasopharyngeal swab specimens and  should not be used as a sole basis for treatment. Nasal washings and  aspirates are unacceptable for Xpert Xpress SARS-CoV-2/FLU/RSV  testing.  Fact Sheet for Patients: PinkCheek.be  Fact Sheet for Healthcare Providers: GravelBags.it  This test is not yet approved or cleared by the [place] FDA and  has been authorized for detection and/or diagnosis of SARS-CoV-2 by  FDA under an Emergency Use Authorization (EUA). This EUA will remain  in effect (meaning this test can be used) for the duration of the  Covid-19 declaration under Section 564(b)(1) of the Act, 21  U.S.C. section 360bbb-3(b)(1), unless the authorization is  terminated or revoked. Performed at [facility], [address] [place], [place] [address]   Culture, blood (routine x 2) Call MD if unable to obtain prior to antibiotics being given     Status: None   Collection Time: 12/30/19  7:59 PM   Specimen: BLOOD  Result Value Ref Range Status   Specimen  Description BLOOD RIGHT FA  Final   Special Requests   Final    BOTTLES DRAWN AEROBIC AND ANAEROBIC Blood Culture adequate volume   Culture   Final    NO GROWTH 5 DAYS Performed at [facility], [address] [place], [place] [address]    Report Status 01/04/2020 FINAL  Final  Culture, blood (routine x 2) Call MD if unable to obtain prior to antibiotics being given     Status: None   Collection Time: 12/30/19  7:59 PM   Specimen: BLOOD  Result Value Ref Range Status   Specimen Description BLOOD RIGHT HAND  Final   Special Requests   Final    BOTTLES DRAWN AEROBIC ONLY Blood Culture results may not be optimal due to an inadequate volume of blood received in culture bottles   Culture   Final    NO GROWTH 5 DAYS Performed at [facility], [address] [place], [place] [address]    Report Status 01/04/2020 FINAL  Final     Labs: BNP (last 3 results) Recent Labs    12/29/19 2056 01/02/20 0553 01/04/20 0541  BNP 38.1 111.2* 50.0   Basic Metabolic Panel: Recent Labs  Lab 01/01/20 0404 01/02/20 0553 01/03/20 0455 01/04/20 0541 01/06/20 0510  NA 137 136 135 137 140  K 3.5 3.7 4.2 3.7 3.6  CL 86* 88* 87* 90* 90*  CO2 37* 38* 38* 39* 40*  GLUCOSE 255* 207* 205* 195* 140*  BUN 47* 37* 31* 34* 26*  CREATININE 1.61* 1.34* 1.14 1.17 0.94  CALCIUM 9.2 9.0 9.2 9.3 9.2   Liver Function Tests: No results for input(s): AST, ALT, ALKPHOS, BILITOT, PROT, ALBUMIN in the last 168 hours. No results for input(s): LIPASE, AMYLASE in the last 168 hours. No results for input(s): AMMONIA in the last 168 hours. CBC: Recent Labs  Lab 12/31/19 0445 01/01/20 0404 01/02/20 0553 01/05/20 0425 01/06/20 0510  WBC 15.9* 15.4* 11.6* 16.9* 15.5*  HGB 14.9 14.3 14.2 14.5 13.6  HCT 46.8 45.9 43.5 43.9 42.8  MCV 96.3 98.5 95.2 93.4 97.7  PLT 363 346 312 304 281   Cardiac Enzymes: No results for input(s): CKTOTAL, CKMB, CKMBINDEX, TROPONINI in the last 168  hours. BNP: Invalid input(s): POCBNP CBG: No results for input(s): GLUCAP in the last 168 hours. D-Dimer No results for input(s): DDIMER in the last 72 hours. Hgb A1c No results for input(s): HGBA1C  in the last 72 hours. Lipid Profile No results for input(s): CHOL, HDL, LDLCALC, TRIG, CHOLHDL, LDLDIRECT in the last 72 hours. Thyroid function studies No results for input(s): TSH, T4TOTAL, T3FREE, THYROIDAB in the last 72 hours.  Invalid input(s): FREET3 Anemia work up Recent Labs    01/03/20 1425  FERRITIN 66   Urinalysis    Component Value Date/Time   BILIRUBINUR Neg 06/20/2018 1046   PROTEINUR Positive (A) 06/20/2018 1046   UROBILINOGEN 1.0 06/20/2018 1046   NITRITE Neg 06/20/2018 1046   LEUKOCYTESUR Large (3+) (A) 06/20/2018 1046   Sepsis Labs Invalid input(s): PROCALCITONIN,  WBC,  LACTICIDVEN Microbiology Recent Results (from the past 240 hour(s))  Respiratory Panel by RT PCR (Flu A&B, Covid) - Nasopharyngeal Swab     Status: None   Collection Time: 12/30/19  3:08 AM   Specimen: Nasopharyngeal Swab  Result Value Ref Range Status   SARS Coronavirus 2 by RT PCR NEGATIVE NEGATIVE Final    Comment: (NOTE) SARS-CoV-2 target nucleic acids are NOT DETECTED.  The SARS-CoV-2 RNA is generally detectable in upper respiratoy specimens during the acute phase of infection. The lowest concentration of SARS-CoV-2 viral copies this assay can detect is 131 copies/mL. A negative result does not preclude SARS-Cov-2 infection and should not be used as the sole basis for treatment or other patient management decisions. A negative result may occur with  improper specimen collection/handling, submission of specimen other than nasopharyngeal swab, presence of viral mutation(s) within the areas targeted by this assay, and inadequate number of viral copies (<131 copies/mL). A negative result must be combined with clinical observations, patient history, and epidemiological information.  The expected result is Negative.  Fact Sheet for Patients:  PinkCheek.be  Fact Sheet for Healthcare Providers:  GravelBags.it  This test is no t yet approved or cleared by the [place] FDA and  has been authorized for detection and/or diagnosis of SARS-CoV-2 by FDA under an Emergency Use Authorization (EUA). This EUA will remain  in effect (meaning this test can be used) for the duration of the COVID-19 declaration under Section 564(b)(1) of the Act, 21 U.S.C. section 360bbb-3(b)(1), unless the authorization is terminated or revoked sooner.     Influenza A by PCR NEGATIVE NEGATIVE Final   Influenza B by PCR NEGATIVE NEGATIVE Final    Comment: (NOTE) The Xpert Xpress SARS-CoV-2/FLU/RSV assay is intended as an aid in  the diagnosis of influenza from Nasopharyngeal swab specimens and  should not be used as a sole basis for treatment. Nasal washings and  aspirates are unacceptable for Xpert Xpress SARS-CoV-2/FLU/RSV  testing.  Fact Sheet for Patients: PinkCheek.be  Fact Sheet for Healthcare Providers: GravelBags.it  This test is not yet approved or cleared by the [place] FDA and  has been authorized for detection and/or diagnosis of SARS-CoV-2 by  FDA under an Emergency Use Authorization (EUA). This EUA will remain  in effect (meaning this test can be used) for the duration of the  Covid-19 declaration under Section 564(b)(1) of the Act, 21  U.S.C. section 360bbb-3(b)(1), unless the authorization is  terminated or revoked. Performed at [facility], [address] [place], [place] [address]   Culture, blood (routine x 2) Call MD if unable to obtain prior to antibiotics being given     Status: None   Collection Time: 12/30/19  7:59 PM   Specimen: BLOOD  Result Value Ref Range Status   Specimen Description BLOOD RIGHT FA  Final   Special  Requests  Final    BOTTLES DRAWN AEROBIC AND ANAEROBIC Blood Culture adequate volume   Culture   Final    NO GROWTH 5 DAYS Performed at [facility], [address] [place], [place] [address]    Report Status 01/04/2020 FINAL  Final  Culture, blood (routine x 2) Call MD if unable to obtain prior to antibiotics being given     Status: None   Collection Time: 12/30/19  7:59 PM   Specimen: BLOOD  Result Value Ref Range Status   Specimen Description BLOOD RIGHT HAND  Final   Special Requests   Final    BOTTLES DRAWN AEROBIC ONLY Blood Culture results may not be optimal due to an inadequate volume of blood received in culture bottles   Culture   Final    NO GROWTH 5 DAYS Performed at [facility], [address], [place], [place] [address]    Report Status 01/04/2020 FINAL  Final     Time coordinating discharge: Over 30 minutes  SIGNED:   Nolberto Hanlon, MD  [facility] 01/06/2020, 12:31 PM Pager   If 7PM-7AM, please contact night-coverage [email or web address] Password [email or web address]

## 2020-01-06 NOTE — Plan of Care (Signed)

## 2020-01-06 NOTE — Progress Notes (Signed)
Patient discharging home. Patient states his car is here and will drive himself home but is looking for someone to come up here to ride with him. Discharge instructions given to patient, verbalized understanding. IV removed. Portable O2 at bedside.

## 2020-01-07 ENCOUNTER — Telehealth: Payer: Self-pay

## 2020-01-07 DIAGNOSIS — Z9981 Dependence on supplemental oxygen: Secondary | ICD-10-CM | POA: Diagnosis not present

## 2020-01-07 DIAGNOSIS — F32A Depression, unspecified: Secondary | ICD-10-CM | POA: Diagnosis not present

## 2020-01-07 DIAGNOSIS — N182 Chronic kidney disease, stage 2 (mild): Secondary | ICD-10-CM | POA: Diagnosis not present

## 2020-01-07 DIAGNOSIS — J441 Chronic obstructive pulmonary disease with (acute) exacerbation: Secondary | ICD-10-CM | POA: Diagnosis not present

## 2020-01-07 DIAGNOSIS — B370 Candidal stomatitis: Secondary | ICD-10-CM | POA: Diagnosis not present

## 2020-01-07 DIAGNOSIS — K219 Gastro-esophageal reflux disease without esophagitis: Secondary | ICD-10-CM | POA: Diagnosis not present

## 2020-01-07 DIAGNOSIS — N179 Acute kidney failure, unspecified: Secondary | ICD-10-CM | POA: Diagnosis not present

## 2020-01-07 DIAGNOSIS — K5900 Constipation, unspecified: Secondary | ICD-10-CM | POA: Diagnosis not present

## 2020-01-07 DIAGNOSIS — H409 Unspecified glaucoma: Secondary | ICD-10-CM | POA: Diagnosis not present

## 2020-01-07 DIAGNOSIS — I130 Hypertensive heart and chronic kidney disease with heart failure and stage 1 through stage 4 chronic kidney disease, or unspecified chronic kidney disease: Secondary | ICD-10-CM | POA: Diagnosis not present

## 2020-01-07 DIAGNOSIS — Z6841 Body Mass Index (BMI) 40.0 and over, adult: Secondary | ICD-10-CM | POA: Diagnosis not present

## 2020-01-07 DIAGNOSIS — I5033 Acute on chronic diastolic (congestive) heart failure: Secondary | ICD-10-CM | POA: Diagnosis not present

## 2020-01-07 DIAGNOSIS — J9621 Acute and chronic respiratory failure with hypoxia: Secondary | ICD-10-CM | POA: Diagnosis not present

## 2020-01-07 DIAGNOSIS — Q761 Klippel-Feil syndrome: Secondary | ICD-10-CM | POA: Diagnosis not present

## 2020-01-07 DIAGNOSIS — Q039 Congenital hydrocephalus, unspecified: Secondary | ICD-10-CM | POA: Diagnosis not present

## 2020-01-07 DIAGNOSIS — Z87891 Personal history of nicotine dependence: Secondary | ICD-10-CM | POA: Diagnosis not present

## 2020-01-07 DIAGNOSIS — Z9181 History of falling: Secondary | ICD-10-CM | POA: Diagnosis not present

## 2020-01-07 DIAGNOSIS — E785 Hyperlipidemia, unspecified: Secondary | ICD-10-CM | POA: Diagnosis not present

## 2020-01-07 DIAGNOSIS — Z9689 Presence of other specified functional implants: Secondary | ICD-10-CM | POA: Diagnosis not present

## 2020-01-07 DIAGNOSIS — F419 Anxiety disorder, unspecified: Secondary | ICD-10-CM | POA: Diagnosis not present

## 2020-01-07 NOTE — Telephone Encounter (Signed)
No HFU scheduled at this time. 

## 2020-01-07 NOTE — Telephone Encounter (Signed)
Patient states he contacted [employer] and was told his oxygen would be delivered this morning, but, he has not seen them yet (5:25 pm). Took Metolazone 2.5 mg today with Demadex 60 mg BID and Spironolactone 50 mg qd for edema with history of CHF. Still having SOB and significant swelling. Recommend he go to the ER.

## 2020-01-07 NOTE — Telephone Encounter (Signed)
Transition Care Management Follow-up Telephone Call  Date of discharge and from where: [facility] on 01/06/20  How have you been since you were released from the hospital? Pt states he does not feel well today and has been struggling since returning home. Pt is trying to get more oxygen currently. Pt was sent home with what was supposed to be enough to get him through to today but ran out last night. Pt has a delivery scheduled today however [employer] could not give him a time. Pt does not have a pulse ox so he has not been able to check his Sp02 level. Pt declines pain, fever, weakness, coughing or n/v/d.  Any questions or concerns? Yes, pt would like to know if there is somewhere he can get an O2 tank? Pt is SOB and is unsure when [facility] will be delivering his tank today.  Items Reviewed:  Did the pt receive and understand the discharge instructions provided? Yes   Medications obtained and verified? No, declined reviewing at this time due to SOB.  Any new allergies since your discharge? No   Dietary orders reviewed? No  Do you have support at home? Has a friend with him currently that comes when she is available.  Other (ie: DME, Home Health, etc): PT and OT was ordered for in home services. Oxygen was ordered from [facility].  Functional Questionnaire: (I = Independent and D = Dependent)  Bathing/Dressing- I   Meal Prep- I  Eating- I  Maintaining continence- I  Transferring/Ambulation- I  Managing Meds- I   Follow up appointments reviewed:    PCP Hospital f/u appt confirmed? Yes  scheduled to see [name] on 01/14/20 @ 2:40 PM.  [facility] f/u appt confirmed? Yes    Are transportation arrangements needed? No   If their condition worsens, is the pt aware to call  their PCP or go to the ED? Yes  Was the patient provided with contact information for the PCP's office or ED? Yes  Was the pt encouraged to call back with questions or concerns? Yes

## 2020-01-08 DIAGNOSIS — I130 Hypertensive heart and chronic kidney disease with heart failure and stage 1 through stage 4 chronic kidney disease, or unspecified chronic kidney disease: Secondary | ICD-10-CM | POA: Diagnosis not present

## 2020-01-08 DIAGNOSIS — I5033 Acute on chronic diastolic (congestive) heart failure: Secondary | ICD-10-CM | POA: Diagnosis not present

## 2020-01-08 DIAGNOSIS — N179 Acute kidney failure, unspecified: Secondary | ICD-10-CM | POA: Diagnosis not present

## 2020-01-08 DIAGNOSIS — J9621 Acute and chronic respiratory failure with hypoxia: Secondary | ICD-10-CM | POA: Diagnosis not present

## 2020-01-08 DIAGNOSIS — J441 Chronic obstructive pulmonary disease with (acute) exacerbation: Secondary | ICD-10-CM | POA: Diagnosis not present

## 2020-01-08 DIAGNOSIS — N182 Chronic kidney disease, stage 2 (mild): Secondary | ICD-10-CM | POA: Diagnosis not present

## 2020-01-08 NOTE — Progress Notes (Signed)
Virtual Visit via Telephone Note   This visit type was conducted due to national recommendations for restrictions regarding the COVID-19 Pandemic (e.g. social distancing) in an effort to limit this patient's exposure and mitigate transmission in our community.  Due to his co-morbid illnesses, this patient is at least at moderate risk for complications without adequate follow up.  This format is felt to be most appropriate for this patient at this time.  The patient did not have access to video technology/had technical difficulties with video requiring transitioning to audio format only (telephone).  All issues noted in this document were discussed and addressed.  No physical exam could be performed with this format.  Please refer to the patient's chart for his  consent to telehealth for [facility].    Date:  01/11/2020   ID:  Scott [name], DOB [birth year], MRN [id number] The patient was identified using 2 identifiers.  Patient Location: Other:  [facility] Provider Location: Office/Clinic  PCP:  Margo Common, PA  Cardiologist:  Ida Rogue, MD  Electrophysiologist:  None   Evaluation Performed:  Follow-Up Visit  Chief Complaint: Hospital follow up  History of Present Illness:    Scott [name] is a 49 y.o. male with Klippel-File syndrome, chronic hypoxic respiratory failure secondary to severe restrictive lung disease, airway hyperactivity, and COPD, HFpEF, HTN, HLD, asthma, alopecia, migraine disorder, open-angle glaucoma, depression,arthritis, anxiety, obesity, kyphoscoliosis, and GERD who presents for  hospital follow-up after his recent admission from 9/25 through 10/3 as detailed below.  He was seen in the ED in 02/2018 for dyspnea and subsequently diagnosed with HFpEF. Echo in 02/2018 showed an EF of 55-60%. Lexiscan MPI in 02/2018 showed no evidence of ischemia. Sleep study in 03/2018 showed no evidence of sleep apnea. Pulmonary function testing has shown severe restrictive lung  disease. He has previously been established with pulmonology, though was dismissed from their practice for multiple no shows, last seeing them in 01/2018. He was previously followed by Dr. Fletcher Anon, though has subsequently transitioned to Dr. Rockey Situ. He is also followed by the [facility]. He has been seen several times over July, August and September in our office for complaints related to ongoing dyspnea with reported weight gain. His weights in the office were noted to have been relatively stable in August and September (214-->216 pounds).  He was advised to reestablish with pulmonology.  At his last visit in our office on 12/12/2019, he continued to note 2-3 pound weight gain, abdominal fullness, exertional SOB, orthopnea, and chest tightness. He was taking torsemide 60 mg bid at baseline and recently increased this to 80 mg bid in addition to metolazone 2.5 mg three times per week.  It was noted he had missed his last 2 days of diuretic leading up to his worsening symptoms, which was felt to be a significant contributing factor. It was also felt his restrictive lung disease, COPD, kyphoscoliosis, obesity, and and physical deconditioning were contributing.  He was advised to take torsemide 80 mg bid x 3 days, followed by resumption of 60 mg bid thereafter and to continue metolazone 2.5 mg three times weekly.   He was recently admitted to the hospital from 9/25 to 10/3 for COPD exacerbation and possible mild acute on chronic HFpEF.  BNP normal. HS-Tn normal. D-dimer normal, COVID negative.  CXR showed mild groundglass opacities bilaterally.  Lower extremity ultrasound was negative for DVT bilaterally.  He was treated with antibiotics, steroids, nebulizers, and IV Lasix with symptomatic improvement.  He was discharged on supplemental oxygen at 2-3 L. Documented day of discharge weight 99.6 kg.  Discharge diuretic therapy: torsemide 60 mg bid, metolazone 2.5 mg three times weekly, and spironolactone 50 mg  daily.   Since his hospital admission he has noted an improvement in his breathing.  He remains on supplemental oxygen at 2 L via nasal cannula.  He does have issues with his oxygen concentrator with travel.  His weight is down 6 pounds since his last visit with [place].  He remains on torsemide 60 mg twice daily and takes metolazone 2.5 mg every other day along with spironolactone 50 mg daily.  His lower extremity swelling and abdominal tension are significantly improved.  He is not able to lay supine without issues.  He does continue to have a dry cough.  He has follow-up with pulmonology later this month.  He is watching his salt and p.o. fluid intake.  He is currently residing in a motel as he had his home fumigated secondary to [employer] and will be moving back in this weekend.   Labs independently reviewed: 01/2020 - HGB 13.6, PLT 281, potassium 3.6, BUN 26, SCr 0.94, BNP 40 11/2019 - albumin 4.3, AST/ALT normal 02/2018 - A1c 5.7, TC 155, TG 74, HDL 55, LDL 85, magnesium 2.4, TSH normal  The patient does not have symptoms concerning for COVID-19 infection (fever, chills, cough, or new shortness of breath).    Past Medical History:  Diagnosis Date  . Acid reflux   . Anxiety   . Arthritis   . Asthma   . CHF (congestive heart failure) ([place])   . Depression   . Glaucoma   . Hyperlipidemia   . Hypertension   . Klippel-Feil syndrome    Past Surgical History:  Procedure Laterality Date  . COLOSTOMY    . DENVER SHUNT PLACEMENT       No outpatient medications have been marked as taking for the 01/11/20 encounter (Appointment) with Rise Mu, PA-C.     Allergies:   Codeine   Social History   Tobacco Use  . Smoking status: Former Smoker    Packs/day: 1.00    Years: 10.00    Pack years: 10.00    Types: Cigarettes    Quit date: 05/2006    Years since quitting: 13.6  . Smokeless tobacco: Never Used  . Tobacco comment: quit 05/2006  Vaping Use  . Vaping Use: Former  . Devices: tried  once  Substance Use Topics  . Alcohol use: No    Alcohol/week: 0.0 standard drinks  . Drug use: No     Family Hx: The patient's family history includes Cancer in his paternal grandfather; Colon cancer in his father; Heart disease in his father; Lung cancer in his maternal grandmother; Multiple sclerosis in his mother; Ovarian cancer in his paternal grandmother; Thyroid cancer in his maternal grandfather.  ROS:   Please see the history of present illness.     All other systems reviewed and are negative.   Prior CV studies:   The following studies were reviewed today:  2D echo 02/2018: - Left ventricle: The cavity size was normal. Wall thickness was  normal. Systolic function was normal. The estimated ejection  fraction was in the range of 55% to 60%. Wall motion was normal;  there were no regional wall motion abnormalities. Doppler  parameters are consistent with abnormal left ventricular  relaxation (grade 1 diastolic dysfunction).  - Left atrium: The atrium was normal in size.  -  Right ventricle: Systolic function was normal.  - Pulmonary arteries: Systolic pressure could not be accurately  estimated.  ___________  Carlton Adam MPI 02/2018: Pharmacological myocardial perfusion imaging study with no significant Ischemia Small region of fixed apical defect, likely attenuation artifact. Normal wall motion, EF estimated at 35% (depressed secondary to GI uptake artifact) No EKG changes concerning for ischemia at peak stress or in recovery. Low risk scan, normal EF on echocardiogram, >55%  Labs/Other Tests and Data Reviewed:    EKG:  No ECG reviewed.  Recent Labs: 11/23/2019: ALT 28 01/04/2020: B Natriuretic Peptide 40.8 01/06/2020: BUN 26; Creatinine, Ser 0.94; Hemoglobin 13.6; Platelets 281; Potassium 3.6; Sodium 140   Recent Lipid Panel Lab Results  Component Value Date/Time   CHOL 155 02/07/2018 05:08 AM   TRIG 74 02/07/2018 05:08 AM   HDL 55 02/07/2018  05:08 AM   CHOLHDL 2.8 02/07/2018 05:08 AM   LDLCALC 85 02/07/2018 05:08 AM    Wt Readings from Last 3 Encounters:  12/31/19 219 lb 9.3 oz (99.6 kg)  12/12/19 216 lb 3.2 oz (98.1 kg)  11/30/19 215 lb (97.5 kg)     Risk Assessment/Calculations:      Objective:    Vital Signs:  There were no vitals taken for this visit.   VITAL SIGNS:  reviewed  ASSESSMENT & PLAN:    1. Chronic hypoxic respiratory failure: Stable.  Likely multifactorial including severe restrictive lung disease, COPD, significant kyphoscoliosis, morbid obesity, physical deconditioning, and possible OSA/OHS.  I suspect his roach issue at home was likely contributing to some of his respiratory distress as well.  He remains on supplemental oxygen which will be managed by his PCP/pulmonology.  2. HFpEF: He denies any symptoms of decompensation.  He remains on torsemide, metolazone, and spironolactone which will be continued.  Recommend follow-up labs be obtained at his next visit.  Schedule echo with further recommendations pending.  CHF education.  3. Atypical chest pain: Improved.   4. Restrictive lung disease/COPD: He was previously followed by pulmonology though dismissed secondary to frequent no-shows.  He will reestablish with them later this month.    5. Obesity: Weight loss advised.  Consider sleep study if not previously done.  COVID-19 Education: The signs and symptoms of COVID-19 were discussed with the patient and how to seek care for testing (follow up with PCP or arrange E-visit).  The importance of social distancing was discussed today.  Time:   Today, I have spent 15 minutes with the patient with telehealth technology discussing the above problems.     Medication Adjustments/Labs and Tests Ordered: Current medicines are reviewed at length with the patient today.  Concerns regarding medicines are outlined above.   Tests Ordered: No orders of the defined types were placed in this  encounter.   Medication Changes: No orders of the defined types were placed in this encounter.   Follow Up:  In Person in 1 month(s)  Signed, Christell Faith, PA-C  01/11/2020 7:54 AM    [facility]

## 2020-01-09 ENCOUNTER — Telehealth: Payer: Self-pay | Admitting: Family Medicine

## 2020-01-09 ENCOUNTER — Telehealth: Payer: Self-pay | Admitting: *Deleted

## 2020-01-09 NOTE — Telephone Encounter (Signed)
Home Health Verbal Orders - Caller/Agency: [facility]// [facility] Callback Number: [phone] opt 2 secure line  Requesting OT/PT/Skilled Nursing/Social Work/Speech Therapy: Nursing  Frequency: 2x for 2wks, 1x for 4wks, 1x every other 3wks.   [place] work referral

## 2020-01-09 NOTE — Telephone Encounter (Signed)
Stacey with [facility] called to ask if dr could prescribe for the pt:  Swish and swallow (he was using in the hospital) Nebulizer  Nebulizer solution  [facility] [place], [address] Phone:  [phone]  Fax:  [phone]     When pt was dc'd from hospital, he wasn't sent home with these items. Erline Levine advised pt she would reach out to the dr and see if you could have them sent to pharmacy.

## 2020-01-09 NOTE — Chronic Care Management (AMB) (Signed)
Chronic Care Management   Note  01/09/2020 Name: SEMAJ COBURN MRN: [id number] DOB: 1971-03-12  CAZ WEAVER is a 49 y.o. year old male who is a primary care patient of [name], [name], [place]. I reached out to [name] by phone today in response to a referral sent by Mr. Camden Knotek Bertagnolli's health plan.     Mr. Leopard was given information about Chronic Care Management services today including:  1. CCM service includes personalized support from designated clinical staff supervised by his physician, including individualized plan of care and coordination with other care providers 2. 24/7 contact phone numbers for assistance for urgent and routine care needs. 3. Service will only be billed when office clinical staff spend 20 minutes or more in a month to coordinate care. 4. Only one practitioner may furnish and bill the service in a calendar month. 5. The patient may stop CCM services at any time (effective at the end of the month) by phone call to the office staff. 6. The patient will be responsible for cost sharing (co-pay) of up to 20% of the service fee (after annual deductible is met).  Patient agreed to services and verbal consent obtained.   Follow up plan: Telephone appointment with care management team member scheduled for: 01/22/2020  [place] Management

## 2020-01-10 ENCOUNTER — Telehealth: Payer: Self-pay | Admitting: Internal Medicine

## 2020-01-10 DIAGNOSIS — I5033 Acute on chronic diastolic (congestive) heart failure: Secondary | ICD-10-CM | POA: Diagnosis not present

## 2020-01-10 DIAGNOSIS — I130 Hypertensive heart and chronic kidney disease with heart failure and stage 1 through stage 4 chronic kidney disease, or unspecified chronic kidney disease: Secondary | ICD-10-CM | POA: Diagnosis not present

## 2020-01-10 DIAGNOSIS — J441 Chronic obstructive pulmonary disease with (acute) exacerbation: Secondary | ICD-10-CM | POA: Diagnosis not present

## 2020-01-10 DIAGNOSIS — N179 Acute kidney failure, unspecified: Secondary | ICD-10-CM | POA: Diagnosis not present

## 2020-01-10 DIAGNOSIS — J9621 Acute and chronic respiratory failure with hypoxia: Secondary | ICD-10-CM | POA: Diagnosis not present

## 2020-01-10 DIAGNOSIS — N182 Chronic kidney disease, stage 2 (mild): Secondary | ICD-10-CM | POA: Diagnosis not present

## 2020-01-10 MED ORDER — MAGNESIUM GLUCONATE 500 MG PO TABS: 500.0000 mg | ORAL_TABLET | Freq: Every day | ORAL | 0 refills | Status: DC

## 2020-01-10 MED ORDER — PANTOPRAZOLE SODIUM 40 MG PO TBEC
40.0000 mg | DELAYED_RELEASE_TABLET | Freq: Every day | ORAL | 0 refills | Status: DC
Start: 2020-01-10 — End: 2020-01-24

## 2020-01-10 MED ORDER — NYSTATIN 100000 UNIT/ML MT SUSP
5.0000 mL | Freq: Four times a day (QID) | OROMUCOSAL | 0 refills | Status: DC
Start: 2020-01-10 — End: 2020-01-11

## 2020-01-10 NOTE — Telephone Encounter (Signed)
**  URGENT**Nurse called again for the verbal orders she would also like a Education officer, museum referral. Please advise

## 2020-01-10 NOTE — Telephone Encounter (Signed)
Received message from RN, Dr Manuella Ghazi, that patient had not received new scripts that he was discharged on, at the pharmacy. Resent the presciptions for Mag gluconate, nystatin s+s, protonix to [place], [place], [place] (pharmacy listed in epic.     Estill Cotta M.D.  [facility] 01/10/2020, 9:13 AM

## 2020-01-11 ENCOUNTER — Other Ambulatory Visit: Payer: Self-pay | Admitting: Family Medicine

## 2020-01-11 ENCOUNTER — Telehealth (INDEPENDENT_AMBULATORY_CARE_PROVIDER_SITE_OTHER): Payer: Medicare Other | Admitting: Physician Assistant

## 2020-01-11 ENCOUNTER — Telehealth: Payer: Self-pay

## 2020-01-11 ENCOUNTER — Encounter: Payer: Self-pay | Admitting: Physician Assistant

## 2020-01-11 ENCOUNTER — Other Ambulatory Visit: Payer: Self-pay

## 2020-01-11 VITALS — Ht 62.0 in | Wt 210.0 lb

## 2020-01-11 DIAGNOSIS — J9611 Chronic respiratory failure with hypoxia: Secondary | ICD-10-CM

## 2020-01-11 DIAGNOSIS — I5032 Chronic diastolic (congestive) heart failure: Secondary | ICD-10-CM

## 2020-01-11 DIAGNOSIS — R0789 Other chest pain: Secondary | ICD-10-CM | POA: Diagnosis not present

## 2020-01-11 DIAGNOSIS — B370 Candidal stomatitis: Secondary | ICD-10-CM

## 2020-01-11 DIAGNOSIS — J449 Chronic obstructive pulmonary disease, unspecified: Secondary | ICD-10-CM | POA: Diagnosis not present

## 2020-01-11 DIAGNOSIS — J984 Other disorders of lung: Secondary | ICD-10-CM | POA: Diagnosis not present

## 2020-01-11 MED ORDER — NYSTATIN 100000 UNIT/ML MT SUSP: 5.0000 mL | Freq: Four times a day (QID) | OROMUCOSAL | 0 refills | Status: DC

## 2020-01-11 NOTE — Telephone Encounter (Signed)
Requesting OT/PT/Skilled Nursing/Social Work/Speech Therapy: Nursing  Frequency: 2x for 2wks, 1x for 4wks, 1x every other 3wks.   [place] work referral   Placed verbal order with [facility] today.

## 2020-01-11 NOTE — Patient Instructions (Signed)
Medication Instructions:  Your physician recommends that you continue on your current medications as directed. Please refer to the Current Medication list given to you today.  *If you need a refill on your cardiac medications before your next appointment, please call your pharmacy*   Lab Work: None ordered If you have labs (blood work) drawn today and your tests are completely normal, you will receive your results only by: Marland Kitchen MyChart Message (if you have MyChart) OR . A paper copy in the mail If you have any lab test that is abnormal or we need to change your treatment, we will call you to review the results.   Testing/Procedures: Your physician has requested that you have an echocardiogram. Echocardiography is a painless test that uses sound waves to create images of your heart. It provides your doctor with information about the size and shape of your heart and how well your heart's chambers and valves are working. This procedure takes approximately one hour. There are no restrictions for this procedure.     Follow-Up: At [facility], you and your health needs are our priority.  As part of our continuing mission to provide you with exceptional heart care, we have created designated Provider Care Teams.  These Care Teams include your primary Cardiologist (physician) and Advanced Practice Providers (APPs -  Physician Assistants and Nurse Practitioners) who all work together to provide you with the care you need, when you need it.  We recommend signing up for the patient portal called "MyChart".  Sign up information is provided on this After Visit Summary.  MyChart is used to connect with patients for Virtual Visits (Telemedicine).  Patients are able to view lab/test results, encounter notes, upcoming appointments, etc.  Non-urgent messages can be sent to your provider as well.   To learn more about what you can do with MyChart, go to [email or web address].    Your next appointment:   4  week(s)  The format for your next appointment:   In Person  Provider:   You may see Ida Rogue, MD or one of the following Advanced Practice Providers on your designated Care Team:    Murray Hodgkins, NP  Christell Faith, PA-C  Marrianne Mood, PA-C  Cadence Kathlen Mody, [place]    Other Instructions N/A

## 2020-01-11 NOTE — Telephone Encounter (Signed)
Copied from [place] [phone]. Topic: General - Other >> Jan 11, 2020  9:16 AM Oneta Rack wrote: Caller name: Glory Buff Relation to pt: PT  from [facility]  Call back number: [phone]    Reason for call:  Physical Therapist requesting  verbal  orders for 1x 1 2x 2

## 2020-01-11 NOTE — Telephone Encounter (Signed)
Scott [name] with [facility] says she is calling for [employer].  Please call her back with verbal on this order and the other phone note requesting orders.  cb [phone]  Option 2

## 2020-01-11 NOTE — Telephone Encounter (Signed)
Scott [name] w/ [facility] calling back aain, states she has called several times and needs approval for these orders  cb [phone] option 2

## 2020-01-14 ENCOUNTER — Other Ambulatory Visit: Payer: Self-pay

## 2020-01-14 ENCOUNTER — Telehealth: Payer: Self-pay

## 2020-01-14 ENCOUNTER — Telehealth: Payer: Self-pay | Admitting: Physician Assistant

## 2020-01-14 ENCOUNTER — Telehealth (INDEPENDENT_AMBULATORY_CARE_PROVIDER_SITE_OTHER): Payer: Medicare Other | Admitting: Family Medicine

## 2020-01-14 VITALS — BP 128/80 | HR 103

## 2020-01-14 DIAGNOSIS — I5032 Chronic diastolic (congestive) heart failure: Secondary | ICD-10-CM

## 2020-01-14 DIAGNOSIS — J9621 Acute and chronic respiratory failure with hypoxia: Secondary | ICD-10-CM | POA: Diagnosis not present

## 2020-01-14 DIAGNOSIS — B370 Candidal stomatitis: Secondary | ICD-10-CM | POA: Diagnosis not present

## 2020-01-14 DIAGNOSIS — J441 Chronic obstructive pulmonary disease with (acute) exacerbation: Secondary | ICD-10-CM | POA: Diagnosis not present

## 2020-01-14 DIAGNOSIS — Q761 Klippel-Feil syndrome: Secondary | ICD-10-CM | POA: Diagnosis not present

## 2020-01-14 NOTE — Telephone Encounter (Signed)
Copied from [place] [phone]. Topic: General - Other >> Jan 14, 2020 11:04 AM Celene Kras wrote: Reason for CRM: Pt calling stating that he is currently on oxygen. He states that he is concerned about coming to his appt due to not having a portable oxygen tank. He is requesting to have advise from PCP on best option. Please advise.

## 2020-01-14 NOTE — Telephone Encounter (Signed)
Attempted to schedule echo and fu visit .  No ans no vm

## 2020-01-14 NOTE — Progress Notes (Signed)
MyChart Video Visit    Virtual Visit via Video Note   This visit type was conducted due to national recommendations for restrictions regarding the COVID-19 Pandemic (e.g. social distancing) in an effort to limit this patient's exposure and mitigate transmission in our community. This patient is at least at moderate risk for complications without adequate follow up. This format is felt to be most appropriate for this patient at this time. Physical exam was limited by quality of the video and audio technology used for the visit.   Patient location: home Provider location: office  I discussed the limitations of evaluation and management by telemedicine and the availability of in person appointments. The patient expressed understanding and agreed to proceed.  Patient: Scott [name]   DOB: 01-Mar-1971   49 y.o. Male  MRN: [id number] Visit Date: 01/14/2020  Today's healthcare provider: [name], PA   Chief Complaint  Patient presents with   Hospitalization Follow-up   Subjective    HPI  The patient is a 49 year old male who presents via video visit for hospital follow up.  Patient was admitted to [facility] from 12/29/19 to 01/06/20.  His discharge diagnosis is COPD exacerbation, with active problems beings hypertension, chronic diastolic heart failure, acute on chronic respiratory failure with hypoxia, depression and acute renal failure superimposed on stage 2 chronic kidney disease.  Past Medical History:  Diagnosis Date   Acid reflux    Anxiety    Arthritis    Asthma    CHF (congestive heart failure) (HCC)    Depression    Glaucoma    Hyperlipidemia    Hypertension    Klippel-Feil syndrome    Past Surgical History:  Procedure Laterality Date   COLOSTOMY     [place] SHUNT PLACEMENT     Family History  Problem Relation Age of Onset   Multiple sclerosis Mother    Colon cancer Father    Heart disease Father    Lung cancer Maternal Grandmother    Thyroid  cancer Maternal Grandfather    Ovarian cancer Paternal Grandmother    Cancer Paternal Grandfather    Allergies  Allergen Reactions   Codeine Shortness Of Breath   Medications: Outpatient Medications Prior to Visit  Medication Sig   albuterol (VENTOLIN HFA) 108 (90 Base) MCG/ACT inhaler Inhale 2 puffs into the lungs every 4 (four) hours as needed for wheezing or shortness of breath.   ANDROGEL PUMP 20.25 MG/ACT (1.62%) GEL Apply 1 application topically daily.    glucosamine-chondroitin 500-400 MG tablet Take 2 tablets by mouth daily.   metolazone (ZAROXOLYN) 2.5 MG tablet Take 1 tablet (2.5 mg total) by mouth 3 (three) times a week.   nystatin (MYCOSTATIN) 100000 UNIT/ML suspension Take 5 mLs (500,000 Units total) by mouth 4 (four) times daily.   pantoprazole (PROTONIX) 40 MG tablet Take 1 tablet (40 mg total) by mouth daily at 12 noon.   PARoxetine (PAXIL) 40 MG tablet Take 1 tablet (40 mg total) by mouth daily.   potassium chloride SA (KLOR-CON) 20 MEQ tablet TAKE 2 TABLETS(40 MEQ) BY MOUTH TWICE DAILY   Spacer/Aero-Holding Chambers DEVI 1 Device by Does not apply route 4 (four) times daily.   spironolactone (ALDACTONE) 50 MG tablet Take 50 mg by mouth daily.   torsemide (DEMADEX) 20 MG tablet Take 3 tablets (60 mg total) by mouth 2 (two) times daily.   Ascorbic Acid (VITAMIN C) 1000 MG tablet Take 1,000 mg by mouth 2 (two) times a day.  b complex vitamins capsule Take 1 capsule by mouth daily.   brinzolamide (AZOPT) 1 % ophthalmic suspension Place 1 drop into both eyes daily.  (Patient not taking: Reported on 12/30/2019)   Lactobacillus (PROBIOTIC ACIDOPHILUS PO) Take by mouth daily.    latanoprost (XALATAN) 0.005 % ophthalmic solution Place 1 drop into both eyes at bedtime.  (Patient not taking: Reported on 12/30/2019)   magnesium gluconate (MAGONATE) 500 MG tablet Take 1 tablet (500 mg total) by mouth daily.   Omega-3 Fatty Acids (OMEGA ESSENTIALS BASIC) LIQD Take  15 mLs by mouth daily.   No facility-administered medications prior to visit.    Review of Systems  Constitutional: Positive for fatigue. Negative for chills and fever.  Respiratory: Positive for cough, chest tightness, shortness of breath and wheezing.   Cardiovascular: Positive for palpitations and leg swelling. Negative for chest pain.      Objective    BP 128/80    Pulse (!) 103    SpO2 97%  on 2 LPM. On room air pulse oximetry drops to 85% with further drop to 70% when he carried out a bag of trash without the oxygen.  Physical Exam: WDWN male with mild respiratory distress.  Head: Normocephalic, atraumatic. Neck: Supple, NROM Respiratory: No acute distress today. Breathing easier with 2 LPM oxygen by nasal cannula. Psych: Normal mood and affect Throat: Less discomfort and little erythema to oral mucosa today.    Assessment & Plan     1. Acute on chronic respiratory failure with hypoxia (HCC) Breathing better on the O2 at 2 LPM by nasal cannula since discharge from hospital on 01-06-20 after treatment of COPD exacerbation with prednisone taper and antibiotics. Scheduled for pulmonology referral. COVID and influenza tests were negative. CXR after admission on 12-29-19 showed chronic, postsurgical and congenital changes of the thoracic skeleton.  2. COPD exacerbation ([place]) Improved with inhaler therapy and oxygen. Schedule for pulmonology referral.  3. Oral candidiasis Improved with use of Nystatin 500,000 units swished and swallowed QID since antibiotics and steroids have been finished.  4. Chronic diastolic heart failure (HCC) Edema improved with Zaroxolyn 2.5 mg, Torsemide 60 mg BID and Spironolactone 50 mg qd. Breathing easier and less edema. Scheduled for follow up with [facility] on 02-12-20 with Laurann Montana, NP.   No follow-ups on file.     I discussed the assessment and treatment plan with the patient. The patient was provided an opportunity to ask questions  and all were answered. The patient agreed with the plan and demonstrated an understanding of the instructions.   The patient was advised to call back or seek an in-person evaluation if the symptoms worsen or if the condition fails to improve as anticipated.  I provided 20 minutes of non-face-to-face time during this encounter.  Andres Shad, PA, have reviewed all documentation for this visit. The documentation on 01/15/20 for the exam, diagnosis, procedures, and orders are all accurate and complete.    [name], [place] [phone] (phone) [phone] (fax)  [place]

## 2020-01-15 ENCOUNTER — Other Ambulatory Visit: Payer: Self-pay

## 2020-01-15 ENCOUNTER — Institutional Professional Consult (permissible substitution): Payer: Medicare Other | Admitting: Pulmonary Disease

## 2020-01-15 ENCOUNTER — Telehealth: Payer: Self-pay

## 2020-01-15 ENCOUNTER — Encounter: Payer: Self-pay | Admitting: Family Medicine

## 2020-01-15 NOTE — Telephone Encounter (Signed)
Patient was scheduled for appointment today at 2:00 with Dr. Patsey Berthold. unfortunately patient was dismissed from our practice 06/2018. I have spoken to patient regarding this matter. Patient was aware of dismissal and stated that he mentioned this during his recent admission. Patient is aware that appointment will need to be canceled and he will need to establish care with another pulmonary office. Patient stated that he will contact PCP or cardiology and request to be referred to [facility] or [facility].   Nothing further needed.

## 2020-01-15 NOTE — Telephone Encounter (Signed)
Sent in referral request for [facility] or [facility].

## 2020-01-15 NOTE — Telephone Encounter (Signed)
Patient went to his pulmonology appt today and he was released from their practice and could not see them.  He needs to be referred to someone out of town he said.     He wants this ASAP.

## 2020-01-17 ENCOUNTER — Telehealth: Payer: Self-pay | Admitting: *Deleted

## 2020-01-17 NOTE — Telephone Encounter (Signed)
I reviewed the patient's chart.  I looks like he also reached out to his PCP and they are sending in a referral for him. See 01/15/20 phone note from his PCP.

## 2020-01-17 NOTE — Telephone Encounter (Signed)
-----   Message from Rise Mu, PA-C sent at 01/16/2020  7:16 PM EDT ----- He is unable to be seen at [facility] due to being dismissed from their practice. She recommends we refer him to [facility] or [facility]. Can you please coordinate this? Thanks

## 2020-01-18 ENCOUNTER — Encounter: Payer: Self-pay | Admitting: Family Medicine

## 2020-01-18 ENCOUNTER — Other Ambulatory Visit: Payer: Self-pay

## 2020-01-18 ENCOUNTER — Telehealth: Payer: Self-pay | Admitting: Cardiovascular Disease

## 2020-01-18 ENCOUNTER — Ambulatory Visit (INDEPENDENT_AMBULATORY_CARE_PROVIDER_SITE_OTHER): Payer: Medicare Other | Admitting: Family Medicine

## 2020-01-18 VITALS — BP 132/74 | HR 110 | Temp 98.5°F | Resp 20 | Wt 212.0 lb

## 2020-01-18 DIAGNOSIS — J9621 Acute and chronic respiratory failure with hypoxia: Secondary | ICD-10-CM

## 2020-01-18 DIAGNOSIS — I130 Hypertensive heart and chronic kidney disease with heart failure and stage 1 through stage 4 chronic kidney disease, or unspecified chronic kidney disease: Secondary | ICD-10-CM | POA: Diagnosis not present

## 2020-01-18 DIAGNOSIS — I5032 Chronic diastolic (congestive) heart failure: Secondary | ICD-10-CM | POA: Diagnosis not present

## 2020-01-18 DIAGNOSIS — E782 Mixed hyperlipidemia: Secondary | ICD-10-CM | POA: Diagnosis not present

## 2020-01-18 DIAGNOSIS — N182 Chronic kidney disease, stage 2 (mild): Secondary | ICD-10-CM | POA: Diagnosis not present

## 2020-01-18 DIAGNOSIS — N179 Acute kidney failure, unspecified: Secondary | ICD-10-CM | POA: Diagnosis not present

## 2020-01-18 DIAGNOSIS — I5033 Acute on chronic diastolic (congestive) heart failure: Secondary | ICD-10-CM | POA: Diagnosis not present

## 2020-01-18 DIAGNOSIS — Q761 Klippel-Feil syndrome: Secondary | ICD-10-CM

## 2020-01-18 DIAGNOSIS — R252 Cramp and spasm: Secondary | ICD-10-CM

## 2020-01-18 DIAGNOSIS — J441 Chronic obstructive pulmonary disease with (acute) exacerbation: Secondary | ICD-10-CM | POA: Diagnosis not present

## 2020-01-18 NOTE — Telephone Encounter (Signed)
Nurse states patient HR is 120, went down to 110, states he has been experiencing this over the past week. Patient c/o Palpitations:  High priority if patient c/o lightheadedness, shortness of breath, or chest pain  1) How long have you had palpitations/irregular HR/ Afib? Are you having the symptoms now? Now 110, this has been going on for the past week  2) Are you currently experiencing lightheadedness, SOB or CP? no  3) Do you have a history of afib (atrial fibrillation) or irregular heart rhythm? no  Have you checked your BP or HR? (document readings if available): BP is 130/72 HR 110   Are you experiencing any other symptoms? No other symptoms  Patient is on oxygen, level 97%

## 2020-01-18 NOTE — Telephone Encounter (Signed)
Spoke with patient and his heart rate has been up. When he was in the hospital it was up in the 120's and finally it is now in the 100's when resting. If he is up and moving around it can go back up in the 120's. Reviewed appointments and he has one in 4 weeks so offered appointment for Monday with APP. He was agreeable with this plan and has no further questions at this time.

## 2020-01-18 NOTE — Telephone Encounter (Signed)
Left voicemail message for home health nurse to call back.

## 2020-01-18 NOTE — Progress Notes (Signed)
I,April Miller,acting as a Education administrator for [employer].,have documented all relevant documentation on the behalf of [employer],as directed by  [employer] while in the presence of [employer], [place].   Established patient visit   Patient: Scott [name]   DOB: Apr 24, 1970   49 y.o. Male  MRN: [id number] Visit Date: 01/18/2020  Today's healthcare provider: [name], PA   Chief Complaint  Patient presents with  . Leg Pain   Subjective    Leg Pain  The incident occurred more than 1 week ago (2 weeks ago). Incident location: muscle spasms  There was no injury mechanism. The pain is present in the right leg and left leg. The quality of the pain is described as aching and cramping. The pain has been intermittent since onset. Associated symptoms include muscle weakness. Pertinent negatives include no inability to bear weight, loss of motion, loss of sensation, numbness or tingling. He reports no foreign bodies present. Nothing aggravates the symptoms. He has tried ice, rest, NSAIDs and heat for the symptoms. The treatment provided no relief.    Patient has had bilateral leg pain for 2-3 weeks. Pain is in both calf's. Patient states he has been having increased muscle spasms for a while now and believes pain is coming from that. Patient is treating symptoms with rest, ibuprofen, heat and ice with no relief.     Medications: Outpatient Medications Prior to Visit  Medication Sig  . albuterol (VENTOLIN HFA) 108 (90 Base) MCG/ACT inhaler Inhale 2 puffs into the lungs every 4 (four) hours as needed for wheezing or shortness of breath.  . ANDROGEL PUMP 20.25 MG/ACT (1.62%) GEL Apply 1 application topically daily.   Marland Kitchen glucosamine-chondroitin 500-400 MG tablet Take 2 tablets by mouth daily.  . metolazone (ZAROXOLYN) 2.5 MG tablet Take 1 tablet (2.5 mg total) by mouth 3 (three) times a week.  . nystatin (MYCOSTATIN) 100000 UNIT/ML suspension Take 5 mLs (500,000 Units total)  by mouth 4 (four) times daily.  . pantoprazole (PROTONIX) 40 MG tablet Take 1 tablet (40 mg total) by mouth daily at 12 noon.  Marland Kitchen PARoxetine (PAXIL) 40 MG tablet Take 1 tablet (40 mg total) by mouth daily.  . potassium chloride SA (KLOR-CON) 20 MEQ tablet TAKE 2 TABLETS(40 MEQ) BY MOUTH TWICE DAILY  . Spacer/Aero-Holding Chambers DEVI 1 Device by Does not apply route 4 (four) times daily.  Marland Kitchen spironolactone (ALDACTONE) 50 MG tablet Take 50 mg by mouth daily.  Marland Kitchen torsemide (DEMADEX) 20 MG tablet Take 3 tablets (60 mg total) by mouth 2 (two) times daily.  . Ascorbic Acid (VITAMIN C) 1000 MG tablet Take 1,000 mg by mouth 2 (two) times a day.  . b complex vitamins capsule Take 1 capsule by mouth daily.  . brinzolamide (AZOPT) 1 % ophthalmic suspension Place 1 drop into both eyes daily.  (Patient not taking: Reported on 12/30/2019)  . Lactobacillus (PROBIOTIC ACIDOPHILUS PO) Take by mouth daily.   Marland Kitchen latanoprost (XALATAN) 0.005 % ophthalmic solution Place 1 drop into both eyes at bedtime.  (Patient not taking: Reported on 12/30/2019)  . magnesium gluconate (MAGONATE) 500 MG tablet Take 1 tablet (500 mg total) by mouth daily.  . Omega-3 Fatty Acids (OMEGA ESSENTIALS BASIC) LIQD Take 15 mLs by mouth daily.   No facility-administered medications prior to visit.    Review of Systems  Constitutional: Negative for appetite change, chills and fever.  Respiratory: Negative for chest tightness, shortness of breath and wheezing.  Cardiovascular: Negative for chest pain and palpitations.  Gastrointestinal: Negative for abdominal pain, nausea and vomiting.  Neurological: Negative for tingling and numbness.      Objective    BP 132/74 (BP Location: Right Arm, Patient Position: Sitting, Cuff Size: Large)   Pulse (!) 110   Temp 98.5 F (36.9 C) (Oral)   Resp 20   Wt 212 lb (96.2 kg)   SpO2 94%   BMI 38.78 kg/m    Physical Exam Constitutional:      General: He is not in acute distress.     Appearance: He is well-developed.  HENT:     Head: Normocephalic and atraumatic.     Right Ear: Hearing normal.     Left Ear: Hearing normal.     Nose: Nose normal.  Eyes:     General: Lids are normal. No scleral icterus.       Right eye: No discharge.        Left eye: No discharge.     Conjunctiva/sclera: Conjunctivae normal.  Cardiovascular:     Rate and Rhythm: Normal rate and regular rhythm.     Heart sounds: Normal heart sounds.  Pulmonary:     Effort: Pulmonary effort is normal. No respiratory distress.     Breath sounds: Normal breath sounds.  Musculoskeletal:     Comments: Stiffness of neck with history of shunt along right side of neck for congenital hydrocephalus. Deformities of both hands with the left having numbness and rigid wrist. Numbness in the left 5th finger. No new weakness in hands. 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   Skin:    Findings: No lesion or rash.  Neurological:     Mental Status: He is alert and oriented to person, place, and time.  Psychiatric:        Speech: Speech normal.        Behavior: Behavior normal.        Thought Content: Thought content normal.      No results found for any visits on 01/18/20.  Assessment & Plan     1. Acute on chronic respiratory failure with hypoxia ([place]) Hospitalized 12-29-19 and discharged 01-06-20 on 2-3 LPM oxygen by nasal cannula from oxygen concentrator.  Was found this was due to acute COPD exacerbation with some acute on chronic diastolic heart failure. Improved after IV Lasix for volume overload, antibiotics and steroid therapy. Continues home inhaler treatment. - CBC with Differential/Platelet - Comprehensive metabolic panel  2. Chronic diastolic heart failure (HCC) Echocardiogram on 02-05-18 showed EF of 55-60% with grade 1 diastolic dysfunction. Followed by Dr. Rockey Situ (cardiologist) and the CHF clinic. - CBC with Differential/Platelet - Comprehensive  metabolic panel  3. Klippel-Feil syndrome Multiple congenital skeletal anomalies with history of hydrocephalus and [place] shunt. Essentially unchanged. - CBC with Differential/Platelet  4. Mixed hyperlipidemia History of mixed hyperlipidemia in the past. Last lipid panel was normal a year ago. - Comprehensive metabolic panel  5. Muscle cramps Some muscle cramps in posterior thighs intermittently. Started before hospitalization on 12-29-19. Will check CMP for electrolyte imbalances. Recommend applying moist heat prn. Increase potassium rich foods. - Comprehensive metabolic panel   No follow-ups on file.      Andres Shad, PA, have reviewed all documentation for this visit. The documentation on 01/18/20 for the exam, diagnosis, procedures, and orders are all accurate and complete.    [name], [place] [phone] (phone) [phone] (fax)  [facility]

## 2020-01-18 NOTE — Telephone Encounter (Signed)
Suspect HR elevated in setting of COPD exacerbation and prednisone use.  I will see him Monday with EKG.  Thanks for letting me know!  Loel Dubonnet, NP

## 2020-01-19 LAB — CBC WITH DIFFERENTIAL/PLATELET
Basophils Absolute: 0 x10E3/uL (ref 0.0–0.2)
Basos: 0 %
EOS (ABSOLUTE): 0.1 x10E3/uL (ref 0.0–0.4)
Eos: 1 %
Hematocrit: 47 % (ref 37.5–51.0)
Hemoglobin: 15.6 g/dL (ref 13.0–17.7)
Immature Grans (Abs): 0 x10E3/uL (ref 0.0–0.1)
Immature Granulocytes: 0 %
Lymphocytes Absolute: 2.2 x10E3/uL (ref 0.7–3.1)
Lymphs: 19 %
MCH: 31.1 pg (ref 26.6–33.0)
MCHC: 33.2 g/dL (ref 31.5–35.7)
MCV: 94 fL (ref 79–97)
Monocytes Absolute: 1.1 x10E3/uL — ABNORMAL HIGH (ref 0.1–0.9)
Monocytes: 10 %
Neutrophils Absolute: 7.9 x10E3/uL — ABNORMAL HIGH (ref 1.4–7.0)
Neutrophils: 70 %
Platelets: 303 x10E3/uL (ref 150–450)
RBC: 5.02 x10E6/uL (ref 4.14–5.80)
RDW: 13 % (ref 11.6–15.4)
WBC: 11.4 x10E3/uL — ABNORMAL HIGH (ref 3.4–10.8)

## 2020-01-19 LAB — COMPREHENSIVE METABOLIC PANEL
ALT: 47 IU/L — ABNORMAL HIGH (ref 0–44)
AST: 21 IU/L (ref 0–40)
Albumin/Globulin Ratio: 1.4 (ref 1.2–2.2)
Albumin: 4.1 g/dL (ref 4.0–5.0)
Alkaline Phosphatase: 100 IU/L (ref 44–121)
BUN/Creatinine Ratio: 23 — ABNORMAL HIGH (ref 9–20)
BUN: 31 mg/dL — ABNORMAL HIGH (ref 6–24)
Bilirubin Total: <0.2 mg/dL (ref 0.0–1.2)
CO2: 34 mmol/L — ABNORMAL HIGH (ref 20–29)
Calcium: 10.1 mg/dL (ref 8.7–10.2)
Chloride: 81 mmol/L — ABNORMAL LOW (ref 96–106)
Creatinine, Ser: 1.34 mg/dL — ABNORMAL HIGH (ref 0.76–1.27)
GFR calc Af Amer: 71 mL/min/{1.73_m2} (ref >59)
GFR calc non Af Amer: 62 mL/min/{1.73_m2} (ref >59)
Globulin, Total: 3 g/dL (ref 1.5–4.5)
Glucose: 197 mg/dL — ABNORMAL HIGH (ref 65–99)
Potassium: 3.4 mmol/L — ABNORMAL LOW (ref 3.5–5.2)
Sodium: 135 mmol/L (ref 134–144)
Total Protein: 7.1 g/dL (ref 6.0–8.5)

## 2020-01-21 ENCOUNTER — Ambulatory Visit: Payer: Medicare Other | Admitting: Family

## 2020-01-22 ENCOUNTER — Encounter: Payer: Self-pay | Admitting: Family

## 2020-01-22 ENCOUNTER — Other Ambulatory Visit: Payer: Self-pay

## 2020-01-22 ENCOUNTER — Ambulatory Visit (INDEPENDENT_AMBULATORY_CARE_PROVIDER_SITE_OTHER): Payer: Medicare Other

## 2020-01-22 DIAGNOSIS — I5032 Chronic diastolic (congestive) heart failure: Secondary | ICD-10-CM

## 2020-01-22 DIAGNOSIS — N179 Acute kidney failure, unspecified: Secondary | ICD-10-CM | POA: Diagnosis not present

## 2020-01-22 DIAGNOSIS — I130 Hypertensive heart and chronic kidney disease with heart failure and stage 1 through stage 4 chronic kidney disease, or unspecified chronic kidney disease: Secondary | ICD-10-CM | POA: Diagnosis not present

## 2020-01-22 DIAGNOSIS — J441 Chronic obstructive pulmonary disease with (acute) exacerbation: Secondary | ICD-10-CM | POA: Diagnosis not present

## 2020-01-22 DIAGNOSIS — J9621 Acute and chronic respiratory failure with hypoxia: Secondary | ICD-10-CM | POA: Diagnosis not present

## 2020-01-22 DIAGNOSIS — I10 Essential (primary) hypertension: Secondary | ICD-10-CM | POA: Diagnosis not present

## 2020-01-22 DIAGNOSIS — N182 Chronic kidney disease, stage 2 (mild): Secondary | ICD-10-CM | POA: Diagnosis not present

## 2020-01-22 DIAGNOSIS — I5033 Acute on chronic diastolic (congestive) heart failure: Secondary | ICD-10-CM | POA: Diagnosis not present

## 2020-01-22 NOTE — Chronic Care Management (AMB) (Signed)
Chronic Care Management   Initial Visit Note  01/22/2020 Name: Scott [name] MRN: [id number] DOB: December 23, 1970  Primary Care Provider: Margo Common, PA Reason for referral : Chronic Care Provider  DEVERON SHAMOON is a 49 y.o. year old male who is a primary care patient of [name], [name], [place]. The CCM team was consulted for assistance with chronic disease management and care coordination. The initial telephonic outreach was conducted today.  Review of Mr. Mensinger's status, including review of consultants reports, relevant labs and test results was conducted today. Collaboration with appropriate care team members was performed as part of the comprehensive evaluation and provision of chronic care management services.    SDOH (Social Determinants of Health) assessments performed: Yes SDOH Interventions     Most Recent Value  SDOH Interventions  Food Insecurity Interventions Intervention Not Indicated  Housing Interventions Other (Comment)  [Reports a pending inspection d/t pests and mold. May also require assistance with utilities during the winter. Will f/u and place Care Guide referral if needed.]  Stress Interventions Intervention Not Indicated  Transportation Interventions Intervention Not Indicated        Medications: Outpatient Encounter Medications as of 01/22/2020  Medication Sig  . metolazone (ZAROXOLYN) 2.5 MG tablet Take 1 tablet (2.5 mg total) by mouth 3 (three) times a week.  Marland Kitchen albuterol (VENTOLIN HFA) 108 (90 Base) MCG/ACT inhaler Inhale 2 puffs into the lungs every 4 (four) hours as needed for wheezing or shortness of breath.  . ANDROGEL PUMP 20.25 MG/ACT (1.62%) GEL Apply 1 application topically daily.   . brinzolamide (AZOPT) 1 % ophthalmic suspension Place 1 drop into both eyes daily.  (Patient not taking: Reported on 12/30/2019)  . glucosamine-chondroitin 500-400 MG tablet Take 2 tablets by mouth daily.  Marland Kitchen latanoprost (XALATAN) 0.005 % ophthalmic solution Place 1 drop  into both eyes at bedtime.  (Patient not taking: Reported on 12/30/2019)  . nystatin (MYCOSTATIN) 100000 UNIT/ML suspension Take 5 mLs (500,000 Units total) by mouth 4 (four) times daily.  . pantoprazole (PROTONIX) 40 MG tablet Take 1 tablet (40 mg total) by mouth daily at 12 noon.  Marland Kitchen PARoxetine (PAXIL) 40 MG tablet Take 1 tablet (40 mg total) by mouth daily.  . potassium chloride SA (KLOR-CON) 20 MEQ tablet TAKE 2 TABLETS(40 MEQ) BY MOUTH TWICE DAILY  . Spacer/Aero-Holding Chambers DEVI 1 Device by Does not apply route 4 (four) times daily.  Marland Kitchen spironolactone (ALDACTONE) 50 MG tablet Take 50 mg by mouth daily.  Marland Kitchen torsemide (DEMADEX) 20 MG tablet Take 3 tablets (60 mg total) by mouth 2 (two) times daily.   No facility-administered encounter medications on file as of 01/22/2020.     Goals Addressed            This Visit's Progress   . Chronic Disease Management       CARE PLAN ENTRY (see longitudinal plan of care for additional care plan information)  Current Barriers:  . Chronic Disease Management support and education needs related to HTN, COPD, CHF and HLD.  Case Manager Clinical Goal(s): Over the next 120 days, patient will: . Not require hospitalization or emergent care d/t complications r/t chronic illnesses. . Attend all scheduled medical appointments. . Take all medications as prescribed. . Monitor BP and record readings. Maintain BP at goal of < 140/90. . Weigh daily and maintain a log. . Follow recommended treatment plan to prevent COPD exacerbation. . Follow recommended safety measures to prevent falls and injuries. Over the next 30  days, patient will: . Follow up with PCP regarding request for a new Pulmonology referral. . Follow up with the care management team regarding possible need for Care Guide referrals.    Interventions:  . Inter-disciplinary care team collaboration (see longitudinal plan of care) . Reviewed medications. Advised to take all medications as  prescribed. Advised to avoid abruptly discontinuing medications and notify provider if unable to tolerate prescribed regimen. Encouraged to notify care management team with concerns regarding medication management or prescription costs.  . Provided information regarding established BP parameters and indications for notifying his provider. Encouraged to monitor BP daily and record readings.   . Provided information regarding weight parameters and s/sx of complications r/t CHF. Advised to weigh daily and maintain a log. Advised to notify provider for weight gain greater than 3 lbs overnight or 5 lbs in a week. Reports monitoring closely for indications of fluid retention. Reports his lower extremity edema has significantly decreased since his recent hospitalization. Attempting to weigh daily. Reports weights since discharge have not fluctuated outside of the established range. Reports morning weight today was 210 lbs.   . Discussed current treatment plan r/t COPD management. Discussed appropriate interventions for self management. Reviewed worsening s/sx that require immediate medical attention. Reports wearing O2 in the home continuously as advised. Setting at 2 L/min. Monitors oxygen saturations with range in the high 90's with O2. Reports a notable decrease in activity tolerance over the past few months. Denies recent episodes of shortness of breath. Reports having prescribed inhalers but feels he would benefit from having a nebulizer. He is pending a new referral for Pulmonology and plans to discuss this once he establishes care.   . Provided information regarding safety and fall prevention measures. Encouraged to use assistive device as needed when ambulating. Encouraged to use extra caution to avoid overexertion since his activity tolerance has significantly decreased. Advised to adhere to exercises and activity restrictions as outlined by the Physical Therapy team.   . Reviewed pending appointments.  Encouraged to attend all medical appointments as scheduled to prevent delays in care. Encouraged to notify the care management team with concerns regarding transportation assistance.   . Discussed plan for ongoing care management and follow-up. Provided direct contact information for the CCM Nurse Case Manager. Reports transitioning well since returning home. He is currently receiving Home Health services. Pending appointment with the Home Health Physical Therapist later today. We discussed his ability to function independently in the home. Reports his fiance is currently available to assist as needed. Agreed to provide an update if his health declines and long term in-home care is needed. Discussed current care management needs r/t his home and possible need for additional referrals. He expressed concerns regarding anticipated increase in utilities over the next few months d/t requiring gas to heat the home. Also mentioned concerns regarding the previous condition of the home d/t water damage. Anticipating a home inspection.  Agreed to provide an update if urgent care management assistance is needed. Will plan to outreach again next month. Will follow up sooner if referral for utilities and home assistance is needed.   Patient Self Care Activities:  . Self administers medications  . Attends scheduled provider appointments . Calls pharmacy for medication refills . Performs ADL's independently   Initial goal documentation         Mr. Grasse was given information about Chronic Care Management services  including:  1. CCM service includes personalized support from designated clinical staff supervised by his physician,  including individualized plan of care and coordination with other care providers 2. 24/7 contact phone numbers for assistance for urgent and routine care needs. 3. Service will only be billed when office clinical staff spend 20 minutes or more in a month to coordinate care. 4. Only one  practitioner may furnish and bill the service in a calendar month. 5. The patient may stop CCM services at any time (effective at the end of the month) by phone call to the office staff. 6. The patient will be responsible for cost sharing (co-pay) of up to 20% of the service fee (after annual deductible is met).  Patient agreed to services and verbal consent obtained.      PLAN A member of the care management team will follow-up with Mr. Keelan next month.    [facility] Care [facility] [phone]

## 2020-01-24 ENCOUNTER — Encounter: Payer: Self-pay | Admitting: Family

## 2020-01-24 ENCOUNTER — Other Ambulatory Visit: Payer: Self-pay

## 2020-01-24 ENCOUNTER — Ambulatory Visit (INDEPENDENT_AMBULATORY_CARE_PROVIDER_SITE_OTHER): Payer: Medicare Other | Admitting: Family

## 2020-01-24 VITALS — BP 116/72 | HR 121 | Ht 62.0 in | Wt 210.0 lb

## 2020-01-24 DIAGNOSIS — J9611 Chronic respiratory failure with hypoxia: Secondary | ICD-10-CM | POA: Diagnosis not present

## 2020-01-24 DIAGNOSIS — Z79899 Other long term (current) drug therapy: Secondary | ICD-10-CM | POA: Diagnosis not present

## 2020-01-24 DIAGNOSIS — N182 Chronic kidney disease, stage 2 (mild): Secondary | ICD-10-CM | POA: Diagnosis not present

## 2020-01-24 DIAGNOSIS — I5033 Acute on chronic diastolic (congestive) heart failure: Secondary | ICD-10-CM | POA: Diagnosis not present

## 2020-01-24 DIAGNOSIS — R739 Hyperglycemia, unspecified: Secondary | ICD-10-CM | POA: Diagnosis not present

## 2020-01-24 DIAGNOSIS — J9621 Acute and chronic respiratory failure with hypoxia: Secondary | ICD-10-CM | POA: Diagnosis not present

## 2020-01-24 DIAGNOSIS — N179 Acute kidney failure, unspecified: Secondary | ICD-10-CM | POA: Diagnosis not present

## 2020-01-24 DIAGNOSIS — I130 Hypertensive heart and chronic kidney disease with heart failure and stage 1 through stage 4 chronic kidney disease, or unspecified chronic kidney disease: Secondary | ICD-10-CM | POA: Diagnosis not present

## 2020-01-24 DIAGNOSIS — I5032 Chronic diastolic (congestive) heart failure: Secondary | ICD-10-CM | POA: Diagnosis not present

## 2020-01-24 DIAGNOSIS — J441 Chronic obstructive pulmonary disease with (acute) exacerbation: Secondary | ICD-10-CM | POA: Diagnosis not present

## 2020-01-24 DIAGNOSIS — R000 Tachycardia, unspecified: Secondary | ICD-10-CM | POA: Diagnosis not present

## 2020-01-24 MED ORDER — BISOPROLOL FUMARATE 5 MG PO TABS: 2.5000 mg | ORAL_TABLET | Freq: Every day | ORAL | 2 refills | Status: DC

## 2020-01-24 NOTE — Progress Notes (Signed)
Office Visit    Patient Name: Scott [name] Date of Encounter: 01/24/2020  Primary Care Provider:  Margo Common, [place] Primary Cardiologist:  Ida Rogue, MD Electrophysiologist:  None   Chief Complaint    Scott [name] is a 49 y.o. male with a hx of diastolic heart failure, Klippel-Feil syndrome, restrictive lung disease, HTN, former tobacco use, HLD, asthma, depression, anxiety presents today for tachycardia.  Past Medical History    Past Medical History:  Diagnosis Date  . Acid reflux   . Anxiety   . Arthritis   . Asthma   . CHF (congestive heart failure) ([place])   . Depression   . Glaucoma   . Hyperlipidemia   . Hypertension   . Klippel-Feil syndrome    Past Surgical History:  Procedure Laterality Date  . COLOSTOMY    . DENVER SHUNT PLACEMENT      Allergies  Allergies  Allergen Reactions  . Codeine Shortness Of Breath    History of Present Illness    Scott [name] is a 49 y.o. male with a hx of diastolic heart failure, Klippel-Feil syndrome, restrictive lung disease, HTN, former tobacco use, HLD, asthma, depression, anxiety.  He was last seen via telemedicine 01/11/20.   Echocardiogram 02/04/2018 LVEF 55 to 60%.  Echocardiogram was performed during hospitalizations with acute on chronic diastolic heart failure with hypoxia requiring IV Lasix.  Stress testing 02/09/2018 with no evidence of ischemia.  Sleep study 03/10/2018 negative for OSA.  Pulmonary function testing notable for severe restrictive lung disease.  At clinic visit in February he was recommended to add metolazone 2.5 mg twice a week for weight over 209 pounds.  He was referred to pulmonology.  Seen in clinic 10/09/19 noting hypotension, abdominal bloating, and atypical chest pain. Of note, he had not been taking his diuretic regularly due to visiting and caring for his mother in the hospital. His chest pain was thought to be due to stress and not suspicion for angina. His hyoptension was presumed due to  taking extra diuretic at same time. He was recommended to resume Torsemide 60mg  daily, Metolazone 2.5mg  three times per week, and reduce his Quinapil to 10mg  with further reduction if BP remained low. He missed his follow up 10/2019 due to his mother's passing.   He was seen in follow up 11/23/19. He noted bilateral anterior chest burning sensation. His reported dry weight is 208lbs and he reported weight at home of 210 pounds. He had discontinued his Quinapril and increased his Torsemide to 60mg  BID. Much of his dyspnea was thought to be due to restrictive lung disease. His BNP and BMP were stable.   He called the office 12/05/19 noting 2-3 lb weight gain associated with abdominal fullness and chest tightness. He had been taking Torsemide 60mg  BID with recent increase to 80mg  BID in addition to Metolazone three times per week. Noted that his Metolazone dates were inconsistent. He was recommended to finish 3 day course of Torsemide 80mg  BID and repeat BMP 12/11/19. He was then recommended to reduce Torsemide to 60mg  BID for protection of renal function.   Seen in clinic 12/12/2019.  Reported worsening orthopnea and dyspnea on exertion.  Was tachycardic at home 106-115 bpm with no sensation of palpitations.  He had missed a dose of his diuretic therapy which is likely cause of his dyspnea as well as lung disease.  Encourage follow-up with pulmonology.  Unfortunately he has been discharged from [facility] pul[facility]ology and has been referred  to pulmonology at [facility] by his primary care provider.  He has not yet heard from their office. Admitted 12/19/2019-01/05/2020 for COPD exacerbation.  He developed fluid retention while on steroid therapy and was treated with IV Lasix.  Bilateral ultrasounds negative for DVT.  D-dimer normal.  HR at home routinely 105-110 and up to 120 with small activity. Not associated with palpitations. Does notice shortness of breath with elevated heart rates. Tells me his oxygen drops at home with  ambulation only if he does not wear his oxygen.   Drinks approximately one 16 oz soda per day, one 32 oz powerade, 8 oz of iced tea, as well as water.   EKGs/Labs/Other Studies Reviewed:   The following studies were reviewed today:  EKG:  EKG is ordered today.  The ekg ordered today demonstrates sinus tachycardia 121 bpm  Recent Labs: 01/04/2020: B Natriuretic Peptide 40.8 01/18/2020: ALT 47; BUN 31; Creatinine, Ser 1.34; Hemoglobin 15.6; Platelets 303; Potassium 3.4; Sodium 135  Recent Lipid Panel    Component Value Date/Time   CHOL 155 02/07/2018 0508   TRIG 74 02/07/2018 0508   HDL 55 02/07/2018 0508   CHOLHDL 2.8 02/07/2018 0508   VLDL 15 02/07/2018 0508   LDLCALC 85 02/07/2018 0508    Home Medications   Current Meds  Medication Sig  . albuterol (VENTOLIN HFA) 108 (90 Base) MCG/ACT inhaler Inhale 2 puffs into the lungs every 4 (four) hours as needed for wheezing or shortness of breath.  . ANDROGEL PUMP 20.25 MG/ACT (1.62%) GEL Apply 1 application topically daily.   Marland Kitchen glucosamine-chondroitin 500-400 MG tablet Take 2 tablets by mouth daily.  . metolazone (ZAROXOLYN) 2.5 MG tablet Take 1 tablet (2.5 mg total) by mouth 3 (three) times a week.  . nystatin (MYCOSTATIN) 100000 UNIT/ML suspension Take 5 mLs (500,000 Units total) by mouth 4 (four) times daily.  Marland Kitchen PARoxetine (PAXIL) 40 MG tablet Take 1 tablet (40 mg total) by mouth daily.  . potassium chloride SA (KLOR-CON) 20 MEQ tablet TAKE 2 TABLETS(40 MEQ) BY MOUTH TWICE DAILY  . Spacer/Aero-Holding Chambers DEVI 1 Device by Does not apply route 4 (four) times daily.  Marland Kitchen spironolactone (ALDACTONE) 50 MG tablet Take 50 mg by mouth daily.  Marland Kitchen torsemide (DEMADEX) 20 MG tablet Take 3 tablets (60 mg total) by mouth 2 (two) times daily.    Review of Systems   All other systems reviewed and are otherwise negative except as noted above.  Physical Exam    VS:  Ht 5\' 2"  (1.575 m)   Wt 210 lb (95.3 kg)   BMI 38.41 kg/m  , BMI Body  mass index is 38.41 kg/m. GEN: Well nourished, well developed, in no acute distress. HEENT: normal. Neck: Supple, no JVD, carotid bruits, or masses. Cardiac: RRR, tachycardic, no murmurs, rubs, or gallops. No clubbing, cyanosis, edema.  Radials/DP/PT 2+ and equal bilaterally.  Respiratory:  Respirations regular and unlabored, clear to auscultation bilaterally.  GI: Soft, nontender, nondistended MS: No deformity or atrophy. Skin: Warm and dry, no rash. Neuro:  Strength and sensation are intact. Psych: Normal affect.  Assessment & Plan   1. Chronic diastolic heart failure / Sinus tachycardia - His sinus tachycardia is likely multifactorial including obesity, physical deconditioning, restrictive lung disease.  Anticipate recent bronchitis as well as prednisone usage contributory.  He reports no palpitations though does report some shortness of breath.  Start bisoprolol 2.5 mg daily.  Avoid Metoprolol due to concomitant lung disease. BMP, BNP, CBC today.  Continue torsemide  60 mg twice daily. Continue metolazone 2.5 mg 3 times per week. Continue spironolactone 50 mg daily. Continue potassium supplementation.  Long discussion regarding need to avoid greater than 2 L of fluid and drink such as Gatorade, dark sodas as they include significant portion of sodium.  He has echocardiogram upcoming for reassessment of LVEF.  2. HTN -BP well controlled. Continue current antihypertensive regimen.   3. Restrictive lung disease - Awaiting appt with [facility] pulmonology.  Recent admission with bronchitis  4. Obesity / Elevated glucose -weight loss via diet and exercise encouraged.  Regular cardiovascular exercise encouraged.  His glucose numbers have been elevated routinely in his primary care requested to add on A1c but was not able to be added on to previous lab collection.  Collecting globin A1c today.  Disposition: Follow up 02/2020 after echocardiogram as previously scheduled.  Loel Dubonnet,  NP 01/24/2020, 2:25 PM

## 2020-01-24 NOTE — Patient Instructions (Addendum)
Medication Instructions:  Your physician has recommended you make the following change in your medication:   START Bisoprolol 2.5mg  (half tablet) once daily  CONTINUE Torsemide 60 mg (three tablets) twice per day. *You may take your second dose in the early afternoon*  *If you need a refill on your cardiac medications before your next appointment, please call your pharmacy*   Lab Work: Your provider recommends lab work today: BMP, CBC, BNP, HgbA1C   If you have labs (blood work) drawn today and your tests are completely normal, you will receive your results only by: Marland Kitchen MyChart Message (if you have MyChart) OR . A paper copy in the mail If you have any lab test that is abnormal or we need to change your treatment, we will call you to review the results.  Testing/Procedures: Your EKG today shows sinus tachycardia.  Follow-Up: At [facility], you and your health needs are our priority.  As part of our continuing mission to provide you with exceptional heart care, we have created designated Provider Care Teams.  These Care Teams include your primary Cardiologist (physician) and Advanced Practice Providers (APPs -  Physician Assistants and Nurse Practitioners) who all work together to provide you with the care you need, when you need it.  We recommend signing up for the patient portal called "MyChart".  Sign up information is provided on this After Visit Summary.  MyChart is used to connect with patients for Virtual Visits (Telemedicine).  Patients are able to view lab/test results, encounter notes, upcoming appointments, etc.  Non-urgent messages can be sent to your provider as well.   To learn more about what you can do with MyChart, go to [email or web address].    Your next appointment:   As previously scheduled  Other Instructions  Recommend you establish with pulmonology- please call your Primary Care office to check on this referral.   Call our office for weight gain of 2  pounds overnight or 5 pounds in one week.  Recommend avoiding high sodium food and drinks.  Bisoprolol Tablets What is this medicine? BISOPROLOL (bis [place] proe lol) is a beta blocker. It decreases the amount of work your heart has to do and helps your heart beat regularly. It is used to treat high blood pressure. This medicine may be used for other purposes; ask your health care provider or pharmacist if you have questions. COMMON BRAND NAME(S): Zebeta What should I tell my health care provider before I take this medicine? They need to know if you have any of these conditions:  chest pain (angina)  diabetes  heart or vessel disease like slow heart rate, worsening heart failure, heart block, sick sinus syndrome or Raynaud's disease  kidney disease  liver disease  lung or breathing disease, like asthma or emphysema  pheochromocytoma  thyroid disease  an unusual or allergic reaction to bisoprolol, other beta-blockers, medicines, foods, dyes, or preservatives  pregnant or trying to get pregnant  breast-feeding How should I use this medicine? Take this drug by mouth. Take it as directed on the prescription label at the same time every day. You can take it with or without food. If it upsets your stomach, take it with food. Keep taking it unless your health care provider tells you to stop. Talk to your health care provider about the use of this drug in children. Special care may be needed. Overdosage: If you think you have taken too much of this medicine contact a poison control center or emergency room at  once. NOTE: This medicine is only for you. Do not share this medicine with others. What if I miss a dose? If you miss a dose, take it as soon as you can. If it is almost time for your next dose, take only that dose. Do not take double or extra doses. What may interact with this medicine? This medicine may interact with the following medications:  certain medicines for blood  pressure, heart disease, irregular heart beat  NSAIDs, medicines for pain and inflammation, like ibuprofen or naproxen  rifampin This list may not describe all possible interactions. Give your health care provider a list of all the medicines, herbs, non-prescription drugs, or dietary supplements you use. Also tell them if you smoke, drink alcohol, or use illegal drugs. Some items may interact with your medicine. What should I watch for while using this medicine? Visit your doctor or health care professional for regular checks on your progress. Check your heart rate and blood pressure regularly while you are taking this medicine. Ask your doctor or health care professional what your heart rate and blood pressure should be, and when you should contact him or her. You may get drowsy or dizzy. Do not drive, use machinery, or do anything that needs mental alertness until you know how this drug affects you. Do not stand or sit up quickly, especially if you are an older patient. This reduces the risk of dizzy or fainting spells. Alcohol can make you more drowsy and dizzy. Avoid alcoholic drinks. This medicine may increase blood sugar. Ask your healthcare provider if changes in diet or medicines are needed if you have diabetes. Do not treat yourself for coughs, colds, or pain while you are taking this medicine without asking your doctor or health care professional for advice. Some ingredients may increase your blood pressure. What side effects may I notice from receiving this medicine? Side effects that you should report to your doctor or health care professional as soon as possible:  allergic reactions like skin rash, itching or hives, swelling of the face, lips, or tongue  breathing problems  chest pain  cold, tingling, or numb hands or feet  confusion  irregular, slow heartbeat  muscle aches and pains   signs and symptoms of high blood sugar such as being more thirsty or hungry or having to  urinate more than normal. You may also feel very tired or have blurry vision.  sweating  swollen legs or ankles  tremors  vomiting Side effects that usually do not require medical attention (report to your doctor or health care professional if they continue or are bothersome):  anxiety  change in sex drive or performance  depression  diarrhea  dry or burning eyes  headache  nausea This list may not describe all possible side effects. Call your doctor for medical advice about side effects. You may report side effects to FDA at 1-800-FDA-1088. Where should I keep my medicine? Keep out of the reach of children and pets. Store at room temperature between 20 and 25 degrees C (68 and 77 degrees F). Protect from light and moisture. Keep the container tightly closed. Throw away any unused drug after the expiration date. NOTE: This sheet is a summary. It may not cover all possible information. If you have questions about this medicine, talk to your doctor, pharmacist, or health care provider.  2020 Elsevier/Gold Standard (2018-11-02 16:46:40)

## 2020-01-25 ENCOUNTER — Telehealth: Payer: Self-pay | Admitting: Family

## 2020-01-25 ENCOUNTER — Telehealth: Payer: Self-pay | Admitting: Cardiovascular Disease

## 2020-01-25 DIAGNOSIS — I130 Hypertensive heart and chronic kidney disease with heart failure and stage 1 through stage 4 chronic kidney disease, or unspecified chronic kidney disease: Secondary | ICD-10-CM | POA: Diagnosis not present

## 2020-01-25 DIAGNOSIS — I5033 Acute on chronic diastolic (congestive) heart failure: Secondary | ICD-10-CM | POA: Diagnosis not present

## 2020-01-25 DIAGNOSIS — N182 Chronic kidney disease, stage 2 (mild): Secondary | ICD-10-CM | POA: Diagnosis not present

## 2020-01-25 DIAGNOSIS — J9621 Acute and chronic respiratory failure with hypoxia: Secondary | ICD-10-CM | POA: Diagnosis not present

## 2020-01-25 DIAGNOSIS — N179 Acute kidney failure, unspecified: Secondary | ICD-10-CM | POA: Diagnosis not present

## 2020-01-25 DIAGNOSIS — J441 Chronic obstructive pulmonary disease with (acute) exacerbation: Secondary | ICD-10-CM | POA: Diagnosis not present

## 2020-01-25 LAB — CBC
Hematocrit: 45.4 % (ref 37.5–51.0)
Hemoglobin: 14.5 g/dL (ref 13.0–17.7)
MCH: 30.5 pg (ref 26.6–33.0)
MCHC: 31.9 g/dL (ref 31.5–35.7)
MCV: 95 fL (ref 79–97)
Platelets: 269 10*3/uL (ref 150–450)
RBC: 4.76 x10E6/uL (ref 4.14–5.80)
RDW: 12.9 % (ref 11.6–15.4)
WBC: 8.3 10*3/uL (ref 3.4–10.8)

## 2020-01-25 LAB — BASIC METABOLIC PANEL
BUN/Creatinine Ratio: 21 — ABNORMAL HIGH (ref 9–20)
BUN: 28 mg/dL — ABNORMAL HIGH (ref 6–24)
CO2: 36 mmol/L — ABNORMAL HIGH (ref 20–29)
Calcium: 9.3 mg/dL (ref 8.7–10.2)
Chloride: 82 mmol/L — ABNORMAL LOW (ref 96–106)
Creatinine, Ser: 1.36 mg/dL — ABNORMAL HIGH (ref 0.76–1.27)
GFR calc Af Amer: 70 mL/min/{1.73_m2} (ref >59)
GFR calc non Af Amer: 61 mL/min/{1.73_m2} (ref >59)
Glucose: 418 mg/dL — ABNORMAL HIGH (ref 65–99)
Potassium: 3.3 mmol/L — ABNORMAL LOW (ref 3.5–5.2)
Sodium: 135 mmol/L (ref 134–144)

## 2020-01-25 LAB — HEMOGLOBIN A1C
Est. average glucose Bld gHb Est-mCnc: 200 mg/dL
Hgb A1c MFr Bld: 8.6 % — ABNORMAL HIGH (ref 4.8–5.6)

## 2020-01-25 LAB — BRAIN NATRIURETIC PEPTIDE: BNP: 20.7 pg/mL (ref 0.0–100.0)

## 2020-01-25 NOTE — Telephone Encounter (Signed)
Agree with recommendation as you listed them. Appreciative of PCP assistance with elevated A1c. Anticipate his large fluid intake of Powerade, Tea, and Dr. Malachi Bonds is contributory to fluctuating weight gain. If we reduce PO fluid to <2L and avoid salt in beverages, fluid volume will stabilize as you discussed with him.  Loel Dubonnet, NP

## 2020-01-25 NOTE — Telephone Encounter (Signed)
Spoke with patient and reviewed that I did not see any medications on his list that could cause nose bleeds. He reports blood pressure of 128/74 and heart rate of 92. Advised to hold firm pressure after which to not blow or pick. Reviewed that if it continues he could try a spray of Afrin and if he lives in a dry home then he should maybe use some normal saline spray to keep in moist through these colder days. He verbalized understanding with no further questions at this time.

## 2020-01-25 NOTE — Telephone Encounter (Signed)
Pt c/o medication issue:  1. Name of Medication: bisoprolol   2. How are you currently taking this medication (dosage and times per day)? 0.5 tablet by mouth  3. Are you having a reaction (difficulty breathing--STAT)? Nose bleeds   4. What is your medication issue? Patient just started medication, believe it is causing his nose bleeds.  Transferred to [facility].

## 2020-01-25 NOTE — Telephone Encounter (Signed)
Scott Dubonnet, NP  01/25/2020 8:00 AM EDT     Renal function overall stable though he may be mildly dehydrated based on labs as he was taking extra Torsemide. Recommend Torsemide 60mg  twice daily and Metolazone 2.5mg  three times per week as prescribed.   Potassium low - recommend 43mEq 3 times per day for 2 days - then return to 44mEq twice per day. CBC including white blood cell count normal.   A1c (which tells [place] blood sugar over last 3 months) indicates diabetes. Will route to PCP to make him aware. This was collected as was unable to be added on to his previous labs at PCP.

## 2020-01-25 NOTE — Telephone Encounter (Addendum)
I spoke with the patient and his fiance regarding his lab results. He is aware of Urban Gibson, NP's recommendations to:  1) Continue torsemide 60 mg BID & metolazone 2.5 mg three times a week as prescribed  2) Take potassium 40 meq three times/ day x 2 days, then return to 40 meq BID  3) He has also been made aware that he is now diabetic per his HbgA1C results from yesterday.   Per the patient, he feels the torsemide 60 mg BID dosing does not work for him.  He has been taking torsemide 80 mg BID due to abdominal swelling on the 60 mg BID dose. I inquired if he is taking metolazone 2.5 mg three times a week. He states he takes this every other day.   I have advised the patient that the higher dose diuretics are showing some stress on his kidneys.  I have advised him that increased salt/ fluid intake can counteract what his diuretics are trying to accomplish.  I have advised he stop drinking sodas- although he states he only drinks 1/ day. He was in the office yesterday with at least a 20 oz regular Dr. Malachi Bonds that was half empty.   He is aware to avoid processed foods/ canned foods.   I have also advised him that his PCP office has seen his HgbA1C and should be reaching out to him with further recommendations.  I had asked him yesterday to please touch base with his PCP office in regards to his pulmonary referral. I have urged him again today to follow up on this.   The patient and his fiance voice understanding, however the patient did not consent to decreasing his torsemide down.   Will forward to Laurann Montana, NP and Dr. Rockey Situ to review.

## 2020-01-31 ENCOUNTER — Other Ambulatory Visit: Payer: Medicare Other

## 2020-01-31 DIAGNOSIS — I5033 Acute on chronic diastolic (congestive) heart failure: Secondary | ICD-10-CM | POA: Diagnosis not present

## 2020-01-31 DIAGNOSIS — J441 Chronic obstructive pulmonary disease with (acute) exacerbation: Secondary | ICD-10-CM | POA: Diagnosis not present

## 2020-01-31 DIAGNOSIS — N179 Acute kidney failure, unspecified: Secondary | ICD-10-CM | POA: Diagnosis not present

## 2020-01-31 DIAGNOSIS — J9621 Acute and chronic respiratory failure with hypoxia: Secondary | ICD-10-CM | POA: Diagnosis not present

## 2020-01-31 DIAGNOSIS — I130 Hypertensive heart and chronic kidney disease with heart failure and stage 1 through stage 4 chronic kidney disease, or unspecified chronic kidney disease: Secondary | ICD-10-CM | POA: Diagnosis not present

## 2020-01-31 DIAGNOSIS — N182 Chronic kidney disease, stage 2 (mild): Secondary | ICD-10-CM | POA: Diagnosis not present

## 2020-02-01 ENCOUNTER — Other Ambulatory Visit: Payer: Medicare Other

## 2020-02-06 ENCOUNTER — Telehealth: Payer: Self-pay | Admitting: Cardiovascular Disease

## 2020-02-06 DIAGNOSIS — F419 Anxiety disorder, unspecified: Secondary | ICD-10-CM | POA: Diagnosis not present

## 2020-02-06 DIAGNOSIS — N182 Chronic kidney disease, stage 2 (mild): Secondary | ICD-10-CM | POA: Diagnosis not present

## 2020-02-06 DIAGNOSIS — Z9981 Dependence on supplemental oxygen: Secondary | ICD-10-CM | POA: Diagnosis not present

## 2020-02-06 DIAGNOSIS — J441 Chronic obstructive pulmonary disease with (acute) exacerbation: Secondary | ICD-10-CM | POA: Diagnosis not present

## 2020-02-06 DIAGNOSIS — H409 Unspecified glaucoma: Secondary | ICD-10-CM | POA: Diagnosis not present

## 2020-02-06 DIAGNOSIS — J9621 Acute and chronic respiratory failure with hypoxia: Secondary | ICD-10-CM | POA: Diagnosis not present

## 2020-02-06 DIAGNOSIS — I130 Hypertensive heart and chronic kidney disease with heart failure and stage 1 through stage 4 chronic kidney disease, or unspecified chronic kidney disease: Secondary | ICD-10-CM | POA: Diagnosis not present

## 2020-02-06 DIAGNOSIS — Z9181 History of falling: Secondary | ICD-10-CM | POA: Diagnosis not present

## 2020-02-06 DIAGNOSIS — K5900 Constipation, unspecified: Secondary | ICD-10-CM | POA: Diagnosis not present

## 2020-02-06 DIAGNOSIS — F32A Depression, unspecified: Secondary | ICD-10-CM | POA: Diagnosis not present

## 2020-02-06 DIAGNOSIS — Q039 Congenital hydrocephalus, unspecified: Secondary | ICD-10-CM | POA: Diagnosis not present

## 2020-02-06 DIAGNOSIS — Z6841 Body Mass Index (BMI) 40.0 and over, adult: Secondary | ICD-10-CM | POA: Diagnosis not present

## 2020-02-06 DIAGNOSIS — Q761 Klippel-Feil syndrome: Secondary | ICD-10-CM | POA: Diagnosis not present

## 2020-02-06 DIAGNOSIS — N179 Acute kidney failure, unspecified: Secondary | ICD-10-CM | POA: Diagnosis not present

## 2020-02-06 DIAGNOSIS — I5033 Acute on chronic diastolic (congestive) heart failure: Secondary | ICD-10-CM | POA: Diagnosis not present

## 2020-02-06 DIAGNOSIS — Z87891 Personal history of nicotine dependence: Secondary | ICD-10-CM | POA: Diagnosis not present

## 2020-02-06 DIAGNOSIS — Z9689 Presence of other specified functional implants: Secondary | ICD-10-CM | POA: Diagnosis not present

## 2020-02-06 DIAGNOSIS — B370 Candidal stomatitis: Secondary | ICD-10-CM | POA: Diagnosis not present

## 2020-02-06 DIAGNOSIS — K219 Gastro-esophageal reflux disease without esophagitis: Secondary | ICD-10-CM | POA: Diagnosis not present

## 2020-02-06 DIAGNOSIS — E785 Hyperlipidemia, unspecified: Secondary | ICD-10-CM | POA: Diagnosis not present

## 2020-02-06 NOTE — Telephone Encounter (Signed)
Pt c/o swelling: STAT is pt has developed SOB within 24 hours  1) How much weight have you gained and in what time span? 6 lbs overnight   2) If swelling, where is the swelling located? abdomen  3) Are you currently taking a fluid pill? yes  4) Are you currently SOB? yes  5) Do you have a log of your daily weights (if so, list)? Yes   210 - Mon  210- tues  216- wed  6) Have you gained 3 pounds in a day or 5 pounds in a week? yes  7) Have you traveled recently?  No travel but prolonged sitting

## 2020-02-06 NOTE — Telephone Encounter (Signed)
I called and spoke with the patient. He states that he has been taking his torsemide 60 mg BID as prescribed (previously reported taking extra prior to his last office visit).  He states he has cut out his sodas and gatorade. His weight was stable ~ 210 lbs, so he did not take his metolazone 2.5 mg 3 times/ week as prescribed for the last ~ 1.5 weeks.  He called today stating that he gained 6 lbs overnight last night.  He reports he has been very cautious about his diet/ sodium intake.  I advised that missing his metolazone doses has most likely contributed to his weight gain.  I have reviewed this with Laurann Montana, NP. She advised the patient not take any extra torsemide today, but to take metolazone 2.5 mg in the morning prior to his torsemide dose.  I have notified the patient of the above recommendations. I have asked that he continue his torsemide 60 mg BID and take metolazone 2.5 mg tomorrow, Saturday, & Monday. He is aware to continue to track his weights and call back Monday should he see no improvement.  The patient voices understanding and is agreeable.  I did confirm with him that he is scheduled to follow up with a Pulmonary doctor in [place] on 04/07/20.

## 2020-02-07 ENCOUNTER — Telehealth: Payer: Self-pay | Admitting: Family Medicine

## 2020-02-07 ENCOUNTER — Telehealth: Payer: Self-pay | Admitting: *Deleted

## 2020-02-07 DIAGNOSIS — J441 Chronic obstructive pulmonary disease with (acute) exacerbation: Secondary | ICD-10-CM | POA: Diagnosis not present

## 2020-02-07 DIAGNOSIS — I5033 Acute on chronic diastolic (congestive) heart failure: Secondary | ICD-10-CM | POA: Diagnosis not present

## 2020-02-07 DIAGNOSIS — J9621 Acute and chronic respiratory failure with hypoxia: Secondary | ICD-10-CM | POA: Diagnosis not present

## 2020-02-07 DIAGNOSIS — I130 Hypertensive heart and chronic kidney disease with heart failure and stage 1 through stage 4 chronic kidney disease, or unspecified chronic kidney disease: Secondary | ICD-10-CM | POA: Diagnosis not present

## 2020-02-07 DIAGNOSIS — N179 Acute kidney failure, unspecified: Secondary | ICD-10-CM | POA: Diagnosis not present

## 2020-02-07 DIAGNOSIS — N182 Chronic kidney disease, stage 2 (mild): Secondary | ICD-10-CM | POA: Diagnosis not present

## 2020-02-07 NOTE — Telephone Encounter (Signed)
Mendel Ryder, with [facility] at home visit with the patient. Upon her arrival the patient was wheezing-administered ventolin inhaler 2 puffs. Reporting a 4 lb weight gain within one week noted puffy face. Taking diuretics as prescribed and has good urinary output today. Advised OV. Appointment tomorrow with pcp at 1:00p via DT. Nurse reporting the patient is not in any distress at this time and agrrees with appointment scheduled for Friday.

## 2020-02-07 NOTE — Telephone Encounter (Signed)
Scott [name] from [facility] called Pt had a fall yesterday, no visible injuries. No bruising, however he is sore in his neck and right hip. He is oxygen dependent, he was wheezing, increased shortness of breath for the past couple of days. His weight is fluctuating between 3-5 pounds for past couple of days. Transferred to triage.

## 2020-02-08 ENCOUNTER — Other Ambulatory Visit: Payer: Self-pay

## 2020-02-08 ENCOUNTER — Encounter: Payer: Self-pay | Admitting: Family Medicine

## 2020-02-08 ENCOUNTER — Ambulatory Visit (INDEPENDENT_AMBULATORY_CARE_PROVIDER_SITE_OTHER): Payer: Medicare Other | Admitting: Family Medicine

## 2020-02-08 VITALS — BP 114/74 | HR 86 | Temp 98.7°F | Resp 22 | Wt 214.2 lb

## 2020-02-08 DIAGNOSIS — E1169 Type 2 diabetes mellitus with other specified complication: Secondary | ICD-10-CM

## 2020-02-08 DIAGNOSIS — Z8639 Personal history of other endocrine, nutritional and metabolic disease: Secondary | ICD-10-CM

## 2020-02-08 DIAGNOSIS — I5032 Chronic diastolic (congestive) heart failure: Secondary | ICD-10-CM | POA: Diagnosis not present

## 2020-02-08 DIAGNOSIS — J9621 Acute and chronic respiratory failure with hypoxia: Secondary | ICD-10-CM | POA: Diagnosis not present

## 2020-02-08 MED ORDER — DAPAGLIFLOZIN PROPANEDIOL 5 MG PO TABS: 5.0000 mg | ORAL_TABLET | Freq: Every day | ORAL | 0 refills | Status: DC

## 2020-02-08 NOTE — Progress Notes (Signed)
Established patient visit   Patient: Scott [name]   DOB: [birth year]   49 y.o. Male  MRN: [id number] Visit Date: 02/08/2020  Today's healthcare provider: [name], PA   Chief Complaint  Patient presents with  . Shortness of Breath   Subjective    Shortness of Breath This is a chronic problem. The problem has been gradually improving. Associated symptoms include wheezing. Pertinent negatives include no abdominal pain, chest pain, fever, leg pain or vomiting. Associated symptoms comments: 3-5 pound weight gain and swelling.     Past Medical History:  Diagnosis Date  . Acid reflux   . Anxiety   . Arthritis   . Asthma   . CHF (congestive heart failure) ([place])   . Depression   . Glaucoma   . Hyperlipidemia   . Hypertension   . Klippel-Feil syndrome    Past Surgical History:  Procedure Laterality Date  . COLOSTOMY    . DENVER SHUNT PLACEMENT     Social History   Tobacco Use  . Smoking status: Former Smoker    Packs/day: 1.00    Years: 10.00    Pack years: 10.00    Types: Cigarettes    Quit date: 05/2006    Years since quitting: 13.7  . Smokeless tobacco: Never Used  . Tobacco comment: quit 05/2006  Vaping Use  . Vaping Use: Former  . Devices: tried once  Substance Use Topics  . Alcohol use: No    Alcohol/week: 0.0 standard drinks  . Drug use: No   Family Status  Relation Name Status  . Mother  Deceased       passed away 14-Jul-2019  . Father  Deceased  . Sister  Alive  . MGM  Deceased  . MGF  Deceased  . PGM  Deceased  . PGF  Deceased   Allergies  Allergen Reactions  . Codeine Shortness Of Breath       Medications: Outpatient Medications Prior to Visit  Medication Sig  . albuterol (VENTOLIN HFA) 108 (90 Base) MCG/ACT inhaler Inhale 2 puffs into the lungs every 4 (four) hours as needed for wheezing or shortness of breath.  . ANDROGEL PUMP 20.25 MG/ACT (1.62%) GEL Apply 1 application topically daily.   . bisoprolol (ZEBETA) 5 MG tablet Take 0.5  tablets (2.5 mg total) by mouth daily.  Marland Kitchen glucosamine-chondroitin 500-400 MG tablet Take 2 tablets by mouth daily.  . metolazone (ZAROXOLYN) 2.5 MG tablet Take 1 tablet (2.5 mg total) by mouth 3 (three) times a week.  . nystatin (MYCOSTATIN) 100000 UNIT/ML suspension Take 5 mLs (500,000 Units total) by mouth 4 (four) times daily.  Marland Kitchen PARoxetine (PAXIL) 40 MG tablet Take 1 tablet (40 mg total) by mouth daily.  . potassium chloride SA (KLOR-CON) 20 MEQ tablet TAKE 2 TABLETS(40 MEQ) BY MOUTH TWICE DAILY  . Spacer/Aero-Holding Chambers DEVI 1 Device by Does not apply route 4 (four) times daily.  Marland Kitchen spironolactone (ALDACTONE) 50 MG tablet Take 50 mg by mouth daily.  Marland Kitchen torsemide (DEMADEX) 20 MG tablet Take 3 tablets (60 mg total) by mouth 2 (two) times daily.   No facility-administered medications prior to visit.    Review of Systems  Constitutional: Positive for unexpected weight change. Negative for appetite change, chills and fever.  HENT: Positive for facial swelling.   Respiratory: Positive for shortness of breath and wheezing. Negative for chest tightness.   Cardiovascular: Negative for chest pain and palpitations.  Gastrointestinal: Positive for abdominal distention. Negative for abdominal  pain, nausea and vomiting.    Last CBC Lab Results  Component Value Date   WBC 8.3 01/24/2020   HGB 14.5 01/24/2020   HCT 45.4 01/24/2020   MCV 95 01/24/2020   MCH 30.5 01/24/2020   RDW 12.9 01/24/2020   PLT 269 16/01/9603   Last metabolic panel Lab Results  Component Value Date   GLUCOSE 418 (H) 01/24/2020   NA 135 01/24/2020   K 3.3 (L) 01/24/2020   CL 82 (L) 01/24/2020   CO2 36 (H) 01/24/2020   BUN 28 (H) 01/24/2020   CREATININE 1.36 (H) 01/24/2020   GFRNONAA 61 01/24/2020   GFRAA 70 01/24/2020   CALCIUM 9.3 01/24/2020   PROT 7.1 01/18/2020   ALBUMIN 4.1 01/18/2020   LABGLOB 3.0 01/18/2020   AGRATIO 1.4 01/18/2020   BILITOT <0.2 01/18/2020   ALKPHOS 100 01/18/2020   AST 21  01/18/2020   ALT 47 (H) 01/18/2020   ANIONGAP 10 01/06/2020   Last lipids Lab Results  Component Value Date   CHOL 155 02/07/2018   HDL 55 02/07/2018   LDLCALC 85 02/07/2018   TRIG 74 02/07/2018   CHOLHDL 2.8 02/07/2018      Objective    BP 114/74   Pulse 86   Temp 98.7 F (37.1 C) (Oral)   Resp (!) 22   Wt 214 lb 3.2 oz (97.2 kg)   SpO2 91% Comment: room air  BMI 39.18 kg/m  BP Readings from Last 3 Encounters:  02/08/20 114/74  01/24/20 116/72  01/18/20 132/74   Wt Readings from Last 3 Encounters:  02/08/20 214 lb 3.2 oz (97.2 kg)  01/24/20 210 lb (95.3 kg)  01/18/20 212 lb (96.2 kg)     Physical Exam Constitutional:      General: He is not in acute distress.    Appearance: He is well-developed.  HENT:     Head: Normocephalic and atraumatic.     Right Ear: Hearing normal.     Left Ear: Hearing normal.     Nose: Nose normal.  Eyes:     General: Lids are normal. No scleral icterus.       Right eye: No discharge.        Left eye: No discharge.     Extraocular Movements: Extraocular movements intact.     Conjunctiva/sclera: Conjunctivae normal.  Cardiovascular:     Rate and Rhythm: Normal rate and regular rhythm.  Pulmonary:     Effort: Pulmonary effort is normal. No respiratory distress.     Breath sounds: No decreased breath sounds, wheezing or rales.  Musculoskeletal:        General: Normal range of motion.     Right lower leg: No edema.     Left lower leg: No edema.  Skin:    Findings: No lesion or rash.  Neurological:     Mental Status: He is alert and oriented to person, place, and time.  Psychiatric:        Speech: Speech normal.        Behavior: Behavior normal.        Thought Content: Thought content normal.      No results found for any visits on 02/08/20.  Assessment & Plan     1. Chronic diastolic heart failure ([place]) Feels he is having swelling in the abdomen at times. Evaluated by cardiology Laurann Montana, NP) on 01-24-20 with  history of diastolic heart failure, Klippel-Feil Syndrome, restrictive lung disease, asthma and HTN. Torsemide was changed to 20  mg three tablets BID and Zaroxolyn 2.5 mg three times a week. LVEF on 02-04-18 was 55-60 5 by echocardiogram. Quinapril was stopped because BP remained low. Does not feel the Torsemide is working very well to control fluid weight gain (average weight up 4 lbs since 01-24-20). Will add Farxiga for elevated sugar and CHF today. Check follow up labs and proceed with pulmonology referral. Follow up with cardiologist if more than 5 lbs weight gain in a week. - CBC with Differential/Platelet - Basic Metabolic Panel (BMET) - dapagliflozin propanediol (FARXIGA) 5 MG TABS tablet; Take 1 tablet (5 mg total) by mouth daily before breakfast.  Dispense: 14 tablet; Refill: 0  2. Acute on chronic respiratory failure with hypoxia (HCC) History of asthma, restrictive lung disease and in the process of pulmonology referral soon. Still on 2 LPM oxygen at home and pulse oximetry up to 91% without oxygen in the office. Drops to 80's with exertion of walking and respiratory rate increases to 24+ until he sits down. Use albuterol QID and may need to go to ER for nebulizer treatment with COVID testing if worsening or fever develops. No wheezes or rales on auscultation today. - CBC with Differential/Platelet  3. History of hypokalemia Still taking Klor-Con 20 meq 2 tablets daily as recommended by Dr. Rockey Situ (cardiologist). K+ was 3.1 on 01-24-20. Recheck BMP today. - Basic Metabolic Panel (BMET)  4. Type 2 diabetes mellitus with other specified complication, without long-term current use of insulin (HCC) BS was 418 on 01-24-20 with creatinine 1.36 and GFR 61. Will recheck CBC and BMP. Given sample of Farxiga 5 mg qd for sugar control and treatment of CHF. Given OneTouch Verio glucometer and recheck in 2 weeks. - CBC with Differential/Platelet - Basic Metabolic Panel (BMET) - dapagliflozin  propanediol (FARXIGA) 5 MG TABS tablet; Take 1 tablet (5 mg total) by mouth daily before breakfast.  Dispense: 14 tablet; Refill: 0   No follow-ups on file.      Andres Shad, PA, have reviewed all documentation for this visit. The documentation on 02/08/20 for the exam, diagnosis, procedures, and orders are all accurate and complete.    [name], [place] [phone] (phone) [phone] (fax)  [place]

## 2020-02-09 LAB — CBC WITH DIFFERENTIAL/PLATELET
Basophils Absolute: 0 10*3/uL (ref 0.0–0.2)
Basos: 0 %
EOS (ABSOLUTE): 0.1 10*3/uL (ref 0.0–0.4)
Eos: 1 %
Hematocrit: 41.2 % (ref 37.5–51.0)
Hemoglobin: 13.9 g/dL (ref 13.0–17.7)
Immature Grans (Abs): 0 10*3/uL (ref 0.0–0.1)
Immature Granulocytes: 0 %
Lymphocytes Absolute: 2 10*3/uL (ref 0.7–3.1)
Lymphs: 20 %
MCH: 30.9 pg (ref 26.6–33.0)
MCHC: 33.7 g/dL (ref 31.5–35.7)
MCV: 92 fL (ref 79–97)
Monocytes Absolute: 1.2 10*3/uL — ABNORMAL HIGH (ref 0.1–0.9)
Monocytes: 12 %
Neutrophils Absolute: 6.6 10*3/uL (ref 1.4–7.0)
Neutrophils: 67 %
Platelets: 296 10*3/uL (ref 150–450)
RBC: 4.5 x10E6/uL (ref 4.14–5.80)
RDW: 12.9 % (ref 11.6–15.4)
WBC: 9.9 10*3/uL (ref 3.4–10.8)

## 2020-02-09 LAB — BASIC METABOLIC PANEL
BUN/Creatinine Ratio: 15 (ref 9–20)
BUN: 20 mg/dL (ref 6–24)
CO2: 39 mmol/L — ABNORMAL HIGH (ref 20–29)
Calcium: 10.5 mg/dL — ABNORMAL HIGH (ref 8.7–10.2)
Chloride: 87 mmol/L — ABNORMAL LOW (ref 96–106)
Creatinine, Ser: 1.31 mg/dL — ABNORMAL HIGH (ref 0.76–1.27)
GFR calc Af Amer: 73 mL/min/{1.73_m2} (ref >59)
GFR calc non Af Amer: 63 mL/min/{1.73_m2} (ref >59)
Glucose: 182 mg/dL — ABNORMAL HIGH (ref 65–99)
Potassium: 4.3 mmol/L (ref 3.5–5.2)
Sodium: 139 mmol/L (ref 134–144)

## 2020-02-11 DIAGNOSIS — N182 Chronic kidney disease, stage 2 (mild): Secondary | ICD-10-CM | POA: Diagnosis not present

## 2020-02-11 DIAGNOSIS — I130 Hypertensive heart and chronic kidney disease with heart failure and stage 1 through stage 4 chronic kidney disease, or unspecified chronic kidney disease: Secondary | ICD-10-CM | POA: Diagnosis not present

## 2020-02-11 DIAGNOSIS — I5033 Acute on chronic diastolic (congestive) heart failure: Secondary | ICD-10-CM | POA: Diagnosis not present

## 2020-02-11 DIAGNOSIS — N179 Acute kidney failure, unspecified: Secondary | ICD-10-CM | POA: Diagnosis not present

## 2020-02-11 DIAGNOSIS — J441 Chronic obstructive pulmonary disease with (acute) exacerbation: Secondary | ICD-10-CM | POA: Diagnosis not present

## 2020-02-11 DIAGNOSIS — J9621 Acute and chronic respiratory failure with hypoxia: Secondary | ICD-10-CM | POA: Diagnosis not present

## 2020-02-12 ENCOUNTER — Ambulatory Visit: Payer: Medicare Other | Admitting: Family

## 2020-02-12 ENCOUNTER — Telehealth: Payer: Self-pay | Admitting: Cardiovascular Disease

## 2020-02-12 ENCOUNTER — Ambulatory Visit: Payer: Self-pay | Admitting: *Deleted

## 2020-02-12 NOTE — Telephone Encounter (Signed)
Patient states he is feeling lightheaded, his BP is 110/63 HR is 66. Also states he has some tightness in his both shoulders "all the way across". Please call to discuss.

## 2020-02-12 NOTE — Telephone Encounter (Signed)
Please advise patinet to go to ER now for evaluation 02/12/2020

## 2020-02-12 NOTE — Progress Notes (Signed)
As long as we maintain follow up in 3-4 weeks as recommended, that pulmonology appointment will be OK. Take the [place] once a day with breakfast.

## 2020-02-12 NOTE — Telephone Encounter (Signed)
Patient was advised to go ED. Patient states he will take he chances and keep his appointment for tomorrow with Sharyn Lull.

## 2020-02-12 NOTE — Telephone Encounter (Signed)
Patient is calling to report new onset dizziness- patient states he had started a new BP medication and he is having low BP.P readings. He did not take the medication today due to the low readings. Bisoprolol 5 mg- patient has been using 1/2 pill- but did not take today because of the low readings. Appointment given for tomorrow in office to check his status. Patient advised to monitor BP reading and pulse- perimeters for ED given- he is aware not to wait for appointment if he had chest pain, trouble breathing, lower BP that 90/60 and low pulse under 60. Patient states he understands and wants to know if he can go to grocery- advised him due to new onset dizziness- to wait if he can and not risk getting dizzy while out and falling. Appointment scheduled at patient convience- requested late morning appointment.  Reason for Disposition . [1] MODERATE dizziness (e.g., interferes with normal activities) AND [2] has NOT been evaluated by physician for this  (Exception: dizziness caused by heat exposure, sudden standing, or poor fluid intake)  Answer Assessment - Initial Assessment Questions 1. DESCRIPTION: "Describe your dizziness."     lightheaded 2. LIGHTHEADED: "Do you feel lightheaded?" (e.g., somewhat faint, woozy, weak upon standing)     Faint, weak 3. VERTIGO: "Do you feel like either you or the room is spinning or tilting?" (i.e. vertigo)     no 4. SEVERITY: "How bad is it?"  "Do you feel like you are going to faint?" "Can you stand and walk?"   - MILD: Feels slightly dizzy, but walking normally.   - MODERATE: Feels very unsteady when walking, but not falling; interferes with normal activities (e.g., school, work) .   - SEVERE: Unable to walk without falling, or requires assistance to walk without falling; feels like passing out now.      mild 5. ONSET:  "When did the dizziness begin?"     Last night 6. AGGRAVATING FACTORS: "Does anything make it worse?" (e.g., standing, change in head  position)     no 7. HEART RATE: "Can you tell me your heart rate?" "How many beats in 15 seconds?"  (Note: not all patients can do this)       BP 111/68          P 66      O2 sat-90 room air, 97- with O2 8. CAUSE: "What do you think is causing the dizziness?"     BP medication 9. RECURRENT SYMPTOM: "Have you had dizziness before?" If Yes, ask: "When was the last time?" "What happened that time?"     Yes- patient did not reach put 10. OTHER SYMPTOMS: "Do you have any other symptoms?" (e.g., fever, chest pain, vomiting, diarrhea, bleeding)       no 11. PREGNANCY: "Is there any chance you are pregnant?" "When was your last menstrual period?"       n/a  Protocols used: DIZZINESS [facility])

## 2020-02-12 NOTE — Telephone Encounter (Signed)
Noted. Call patient and triage in am please.

## 2020-02-13 ENCOUNTER — Ambulatory Visit (INDEPENDENT_AMBULATORY_CARE_PROVIDER_SITE_OTHER): Payer: Medicare Other | Admitting: Adult Health

## 2020-02-13 ENCOUNTER — Encounter: Payer: Self-pay | Admitting: Adult Health

## 2020-02-13 ENCOUNTER — Ambulatory Visit
Admission: RE | Admit: 2020-02-13 | Discharge: 2020-02-13 | Disposition: A | Discharge status: Home / Self Care | Payer: Medicare Other | Attending: Adult Health | Admitting: Adult Health

## 2020-02-13 ENCOUNTER — Other Ambulatory Visit: Payer: Self-pay

## 2020-02-13 ENCOUNTER — Ambulatory Visit
Admission: RE | Admit: 2020-02-13 | Discharge: 2020-02-13 | Disposition: A | Discharge status: Home / Self Care | Payer: Medicare Other | Source: Ambulatory Visit | Attending: Adult Health | Admitting: Adult Health

## 2020-02-13 VITALS — BP 117/75 | HR 83 | Temp 98.5°F | Resp 18 | Wt 213.6 lb

## 2020-02-13 DIAGNOSIS — I5032 Chronic diastolic (congestive) heart failure: Secondary | ICD-10-CM | POA: Insufficient documentation

## 2020-02-13 DIAGNOSIS — R059 Cough, unspecified: Secondary | ICD-10-CM | POA: Diagnosis not present

## 2020-02-13 DIAGNOSIS — R0602 Shortness of breath: Secondary | ICD-10-CM | POA: Diagnosis not present

## 2020-02-13 DIAGNOSIS — R42 Dizziness and giddiness: Secondary | ICD-10-CM | POA: Diagnosis not present

## 2020-02-13 DIAGNOSIS — Z87898 Personal history of other specified conditions: Secondary | ICD-10-CM | POA: Diagnosis not present

## 2020-02-13 NOTE — Patient Instructions (Addendum)
Thank you for allowing the Chronic Care Management team to participate in your care.   Goals Addressed            This Visit's Progress   . Chronic Disease Management       CARE PLAN ENTRY (see longitudinal plan of care for additional care plan information)  Current Barriers:  . Chronic Disease Management support and education needs related to HTN, COPD, CHF and HLD.  Case Manager Clinical Goal(s): Over the next 120 days, patient will: . Not require hospitalization or emergent care d/t complications r/t chronic illnesses. . Attend all scheduled medical appointments. . Take all medications as prescribed. . Monitor BP and record readings. Maintain BP at goal of < 140/90. . Weigh daily and maintain a log. . Follow recommended treatment plan to prevent COPD exacerbation. . Follow recommended safety measures to prevent falls and injuries. Over the next 30 days, patient will: . Follow up with PCP regarding request for a new Pulmonology referral. . Follow up with the care management team regarding possible need for Care Guide referrals.    Interventions:  . Inter-disciplinary care team collaboration (see longitudinal plan of care) . Reviewed medications. Advised to take all medications as prescribed. Advised to avoid abruptly discontinuing medications and notify provider if unable to tolerate prescribed regimen. Encouraged to notify care management team with concerns regarding medication management or prescription costs.  . Provided information regarding established BP parameters and indications for notifying his provider. Encouraged to monitor BP daily and record readings.   . Provided information regarding weight parameters and s/sx of complications r/t CHF. Advised to weigh daily and maintain a log. Advised to notify provider for weight gain greater than 3 lbs overnight or 5 lbs in a week. Reports monitoring closely for indications of fluid retention. Reports his lower extremity edema  has significantly decreased since his recent hospitalization. Attempting to weigh daily. Reports weights since discharge have not fluctuated outside of the established range. Reports morning weight today was 210 lbs.   . Discussed current treatment plan r/t COPD management. Discussed appropriate interventions for self management. Reviewed worsening s/sx that require immediate medical attention. Reports wearing O2 in the home continuously as advised. Setting at 2 L/min. Monitors oxygen saturations with range in the high 90's with O2. Reports a notable decrease in activity tolerance over the past few months. Denies recent episodes of shortness of breath. Reports having prescribed inhalers but feels he would benefit from having a nebulizer. He is pending a new referral for Pulmonology and plans to discuss this once he establishes care.   . Provided information regarding safety and fall prevention measures. Encouraged to use assistive device as needed when ambulating. Encouraged to use extra caution to avoid overexertion since his activity tolerance has significantly decreased. Advised to adhere to exercises and activity restrictions as outlined by the Physical Therapy team.   . Reviewed pending appointments. Encouraged to attend all medical appointments as scheduled to prevent delays in care. Encouraged to notify the care management team with concerns regarding transportation assistance.   . Discussed plan for ongoing care management and follow-up. Provided direct contact information for the CCM Nurse Case Manager. Reports transitioning well since returning home. He is currently receiving Home Health services. Pending appointment with the Home Health Physical Therapist later today. We discussed his ability to function independently in the home. Reports his fiance is currently available to assist as needed. Agreed to provide an update if his health declines and long term in-home  care is needed. Discussed current  care management needs r/t his home and possible need for additional referrals. He expressed concerns regarding anticipated increase in utilities over the next few months d/t requiring gas to heat the home. Also mentioned concerns regarding the previous condition of the home d/t water damage. Anticipating a home inspection.  Agreed to provide an update if urgent care management assistance is needed. Will plan to outreach again next month. Will follow up sooner if referral for utilities and home assistance is needed.   Patient Self Care Activities:  . Self administers medications  . Attends scheduled provider appointments . Calls pharmacy for medication refills . Performs ADL's independently   Initial goal documentation         Mr. Sandy was given information about Chronic Care Management services  including:  1. CCM service includes personalized support from designated clinical staff supervised by his physician, including individualized plan of care and coordination with other care providers 2. 24/7 contact phone numbers for assistance for urgent and routine care needs. 3. Service will only be billed when office clinical staff spend 20 minutes or more in a month to coordinate care. 4. Only one practitioner may furnish and bill the service in a calendar month. 5. The patient may stop CCM services at any time (effective at the end of the month) by phone call to the office staff. 6. The patient will be responsible for cost sharing (co-pay) of up to 20% of the service fee (after annual deductible is met).  Patient agreed to services and verbal consent obtained.    Mr. Sizemore verbalized understanding of the information discussed during the telephonic outreach today. Declined need for a mailed/printed copy of the instructions.    A member of the care management team will follow-up with Mr. Arrighi next month.    Crist[facility] [phone]

## 2020-02-13 NOTE — Patient Instructions (Signed)
Bradycardia, Adult Bradycardia is a slower-than-normal heartbeat. A normal resting heart rate for an adult ranges from 60 to 100 beats per minute. With bradycardia, the resting heart rate is less than 60 beats per minute. Bradycardia can prevent enough oxygen from reaching certain areas of your body when you are active. It can be serious if it keeps enough oxygen from reaching your brain and other parts of your body. Bradycardia is not a problem for everyone. For some healthy adults, a slow resting heart rate is normal. What are the causes? This condition may be caused by:  A problem with the heart, including: ? A problem with the heart's electrical system, such as a heart block. With a heart block, electrical signals between the chambers of the heart are partially or completely blocked, so they are not able to work as they should. ? A problem with the heart's natural pacemaker (sinus node). ? Heart disease. ? A heart attack. ? Heart damage. ? Lyme disease. ? A heart infection. ? A heart condition that is present at birth (congenital heart defect).  Certain medicines that treat heart conditions.  Certain conditions, such as hypothyroidism and obstructive sleep apnea.  Problems with the balance of chemicals and other substances, like potassium, in the blood.  Trauma.  Radiation therapy. What increases the risk? You are more likely to develop this condition if you:  Are age 65 or older.  Have high blood pressure (hypertension), high cholesterol (hyperlipidemia), or diabetes.  Drink heavily, use tobacco or nicotine products, or use drugs. What are the signs or symptoms? Symptoms of this condition include:  Light-headedness.  Feeling faint or fainting.  Fatigue and weakness.  Trouble with activity or exercise.  Shortness of breath.  Chest pain (angina).  Drowsiness.  Confusion.  Dizziness. How is this diagnosed? This condition may be diagnosed based on:  Your  symptoms.  Your medical history.  A physical exam. During the exam, your health care provider will listen to your heartbeat and check your pulse. To confirm the diagnosis, your health care provider may order tests, such as:  Blood tests.  An electrocardiogram (ECG). This test records the heart's electrical activity. The test can show how fast your heart is beating and whether the heartbeat is steady.  A test in which you wear a portable device (event recorder or Holter monitor) to record your heart's electrical activity while you go about your day.  Anexercise test. How is this treated? Treatment for this condition depends on the cause of the condition and how severe your symptoms are. Treatment may involve:  Treatment of the underlying condition.  Changing your medicines or how much medicine you take.  Having a small, battery-operated device called a pacemaker implanted under the skin. When bradycardia occurs, this device can be used to increase your heart rate and help your heart beat in a regular rhythm. Follow these instructions at home: Lifestyle   Manage any health conditions that contribute to bradycardia as told by your health care provider.  Follow a heart-healthy diet. A nutrition specialist (dietitian) can help educate you about healthy food options and changes.  Follow an exercise program that is approved by your health care provider.  Maintain a healthy weight.  Try to reduce or manage your stress, such as with yoga or meditation. If you need help reducing stress, ask your health care provider.  Do not use any products that contain nicotine or tobacco, such as cigarettes, e-cigarettes, and chewing tobacco. If you need help   quitting, ask your health care provider.  Do not use illegal drugs.  Limit alcohol intake to no more than 1 drink a day for nonpregnant women and 2 drinks a day for men. Be aware of how much alcohol is in your drink. In the [place] one drink  equals one 12 oz bottle of beer (355 mL), one 5 oz glass of wine (148 mL), or one 1 oz glass of hard liquor (44 mL). General instructions  Take over-the-counter and prescription medicines only as told by your health care provider.  Keep all follow-up visits as told by your health care provider. This is important. How is this prevented? In some cases, bradycardia may be prevented by:  Treating underlying medical problems.  Stopping behaviors or medicines that can trigger the condition. Contact a health care provider if you:  Feel light-headed or dizzy.  Almost faint.  Feel weak or are easily fatigued during physical activity.  Experience confusion or have memory problems. Get help right away if:  You faint.  You have: ? An irregular heartbeat (palpitations). ? Chest pain. ? Trouble breathing. Summary  Bradycardia is a slower-than-normal heartbeat. With bradycardia, the resting heart rate is less than 60 beats per minute.  Treatment for this condition depends on the cause.  Manage any health conditions that contribute to bradycardia as told by your health care provider.  Do not use any products that contain nicotine or tobacco, such as cigarettes, e-cigarettes, and chewing tobacco, and limit alcohol intake.  Keep all follow-up visits as told by your health care provider. This is important. This information is not intended to replace advice given to you by your health care provider. Make sure you discuss any questions you have with your health care provider. Document Revised: 10/03/2017 Document Reviewed: 08/31/2017 Elsevier Patient Education  2020 Elsevier Inc.  

## 2020-02-13 NOTE — Telephone Encounter (Signed)
I called and spoke with the patient. I have advised him of the message we received from Gobles, NP. He voices understanding that it is ok to hold bisoprolol but to continue his other medications as prescribed.  I have advised him I will check with scheduling to see if there have been any cancellations for a sooner echo, and then we could move up his followup appointment, but if not, he is still ok to keep the echo appointment as scheduled on 11/17 and follow up on 11/24.  The patient voices understanding and is agreeable.

## 2020-02-13 NOTE — Telephone Encounter (Signed)
No openings other than the same day slots for emergencies

## 2020-02-13 NOTE — Progress Notes (Signed)
Established patient visit   Patient: Scott [name]   DOB: 1970/06/18   49 y.o. Male  MRN: [id number] Visit Date: 02/13/2020  Today's healthcare provider: Marcille Buffy, FNP   Chief Complaint  Patient presents with  . Dizziness   Subjective    Dizziness This is a new problem. The current episode started in the past 7 days. The problem occurs constantly. Associated symptoms include fatigue and weakness. Pertinent negatives include no abdominal pain, anorexia, arthralgias, change in bowel habit, chest pain, chills, congestion, coughing, diaphoresis, fever, headaches, joint swelling, myalgias, nausea, neck pain, numbness, rash, sore throat, swollen glands, urinary symptoms, vertigo, visual change or vomiting. Associated symptoms comments: Low blood pressure reading and heart rate. Exacerbated by: blood pressure. He has tried nothing for the symptoms.     Echo on 02/04/2018 55 to 60 %.   heartrate was previously 105 to 110 and then he started  He startes 2.5mg  on 01/24/20 and has been in the 80's. He reports he stopped Bisoprolol 2.5 mg over one week ago due to bradycardia and he reports that he has had much improvement in his dizziness and weakness.  He does note chronic shortness of breath and no change.  Denies any syncope.   He has not started [place] yet as advised by PCP.   Patient  denies any fever, body aches,chills, rash, chest pain, nausea, vomiting, or diarrhea.   Past Medical History:  Diagnosis Date  . Acid reflux   . Anxiety   . Arthritis   . Asthma   . CHF (congestive heart failure) ([place])   . Depression   . Glaucoma   . Hyperlipidemia   . Hypertension   . Klippel-Feil syndrome        Medications: Outpatient Medications Prior to Visit  Medication Sig  . albuterol (VENTOLIN HFA) 108 (90 Base) MCG/ACT inhaler Inhale 2 puffs into the lungs every 4 (four) hours as needed for wheezing or shortness of breath.  . ANDROGEL PUMP 20.25 MG/ACT (1.62%) GEL  Apply 1 application topically daily.   . dapagliflozin propanediol (FARXIGA) 5 MG TABS tablet Take 1 tablet (5 mg total) by mouth daily before breakfast.  . glucosamine-chondroitin 500-400 MG tablet Take 2 tablets by mouth daily.  . metolazone (ZAROXOLYN) 2.5 MG tablet Take 1 tablet (2.5 mg total) by mouth 3 (three) times a week.  Marland Kitchen PARoxetine (PAXIL) 40 MG tablet Take 1 tablet (40 mg total) by mouth daily.  . potassium chloride SA (KLOR-CON) 20 MEQ tablet TAKE 2 TABLETS(40 MEQ) BY MOUTH TWICE DAILY  . spironolactone (ALDACTONE) 50 MG tablet Take 50 mg by mouth daily.  Marland Kitchen torsemide (DEMADEX) 20 MG tablet Take 3 tablets (60 mg total) by mouth 2 (two) times daily.  . bisoprolol (ZEBETA) 5 MG tablet Take 0.5 tablets (2.5 mg total) by mouth daily. (Patient not taking: Reported on 02/13/2020)  . [DISCONTINUED] nystatin (MYCOSTATIN) 100000 UNIT/ML suspension Take 5 mLs (500,000 Units total) by mouth 4 (four) times daily.  . [DISCONTINUED] Spacer/Aero-Holding Dorise Bullion 1 Device by Does not apply route 4 (four) times daily.   No facility-administered medications prior to visit.    Review of Systems  Constitutional: Positive for fatigue. Negative for activity change, appetite change, chills, diaphoresis, fever and unexpected weight change.  HENT: Negative for congestion and sore throat.   Respiratory: Positive for shortness of breath (chronic no change per patient. ). Negative for apnea, cough, choking, chest tightness, wheezing and stridor.   Cardiovascular: Negative for  chest pain, palpitations and leg swelling.  Gastrointestinal: Negative.  Negative for abdominal pain, anorexia, change in bowel habit, nausea and vomiting.  Genitourinary: Negative.   Musculoskeletal: Negative for arthralgias, joint swelling, myalgias and neck pain.  Skin: Negative for rash.  Neurological: Positive for dizziness and weakness. Negative for vertigo, tremors, seizures, syncope, facial asymmetry, speech difficulty,  light-headedness, numbness and headaches.  Psychiatric/Behavioral: Negative.     Last CBC Lab Results  Component Value Date   WBC 9.9 02/08/2020   HGB 13.9 02/08/2020   HCT 41.2 02/08/2020   MCV 92 02/08/2020   MCH 30.9 02/08/2020   RDW 12.9 02/08/2020   PLT 296 19/14/7829   Last metabolic panel Lab Results  Component Value Date   GLUCOSE 182 (H) 02/08/2020   NA 139 02/08/2020   K 4.3 02/08/2020   CL 87 (L) 02/08/2020   CO2 39 (H) 02/08/2020   BUN 20 02/08/2020   CREATININE 1.31 (H) 02/08/2020   GFRNONAA 63 02/08/2020   GFRAA 73 02/08/2020   CALCIUM 10.5 (H) 02/08/2020   PROT 7.1 01/18/2020   ALBUMIN 4.1 01/18/2020   LABGLOB 3.0 01/18/2020   AGRATIO 1.4 01/18/2020   BILITOT <0.2 01/18/2020   ALKPHOS 100 01/18/2020   AST 21 01/18/2020   ALT 47 (H) 01/18/2020   ANIONGAP 10 01/06/2020      Objective    BP 117/75   Pulse 83   Temp 98.5 F (36.9 C) (Oral)   Resp 18   Wt 213 lb 9.6 oz (96.9 kg)   BMI 39.07 kg/m  BP Readings from Last 3 Encounters:  02/13/20 117/75  02/08/20 114/74  01/24/20 116/72   Wt Readings from Last 3 Encounters:  02/13/20 213 lb 9.6 oz (96.9 kg)  02/08/20 214 lb 3.2 oz (97.2 kg)  01/24/20 210 lb (95.3 kg)      Physical Exam Constitutional:      General: He is not in acute distress.    Appearance: Normal appearance. He is not ill-appearing, toxic-appearing or diaphoretic.  HENT:     Head: Normocephalic and atraumatic.     Nose: Nose normal.  Eyes:     General: No scleral icterus.    Pupils: Pupils are equal, round, and reactive to light.  Cardiovascular:     Rate and Rhythm: Normal rate and regular rhythm.     Pulses: Normal pulses.     Heart sounds: Normal heart sounds. No murmur heard.  No friction rub. No gallop.   Pulmonary:     Effort: Pulmonary effort is normal. No respiratory distress.     Breath sounds: Normal breath sounds. No stridor. No wheezing, rhonchi or rales.  Chest:     Chest wall: No tenderness.    Abdominal:     General: There is no distension.     Palpations: Abdomen is soft.     Tenderness: There is no abdominal tenderness. There is no guarding.  Musculoskeletal:        General: Normal range of motion.     Cervical back: Normal range of motion and neck supple. No rigidity.     Right lower leg: No edema.     Left lower leg: No edema.  Skin:    General: Skin is warm.     Capillary Refill: Capillary refill takes less than 2 seconds.  Neurological:     Mental Status: He is alert and oriented to person, place, and time.  Psychiatric:        Mood and Affect: Mood  normal.        Behavior: Behavior normal.        Thought Content: Thought content normal.        Judgment: Judgment normal.       No results found for any visits on 02/13/20.  Assessment & Plan     History of bradycardia - Plan: B Nat Peptide, CBC with Differential/Platelet, Comprehensive Metabolic Panel (CMET), EKG [id number]  Chronic diastolic heart failure (HCC) - Plan: B Nat Peptide, DG Chest 2 View   Hold Bisoprolol- recommend follow up with cardiology. Sent EKG reviewed by me  which is compared to previous  no significant change and sinus rhythm HR 84.  Previous with cardiology on 01/24/20 sinus tachycardia.  Sent notes and ekg to cardiology as well 02/13/2020.  He is advise to call cardiology today.  He has also not started [place] and given his history of dizziness, will check labs today now prior to him starting.   Orders Placed This Encounter  Procedures  . DG Chest 2 View    Order Specific Question:   Reason for Exam (SYMPTOM  OR DIAGNOSIS REQUIRED)    Answer:   history of chf, bradycardia history, on oxygen- comparison to last x ray    Order Specific Question:   Preferred imaging location?    Answer:   [facility]  . B Nat Peptide  . CBC with Differential/Platelet  . Comprehensive Metabolic Panel (CMET)  . EKG 12-Lead   There are other unrelated non-urgent complaints, but due to the  busy schedule and the amount of time I've already spent with him, time does not permit me to address these routine issues at today's visit. I've requested another appointment to review these additional issues.    Return in about 1 week (around 02/20/2020), or if symptoms worsen or fail to improve, for at any time for any worsening symptoms, Go to Emergency room/ urgent care if worse.     Addressed acute and or chronic medical problems today requiring 30 minutes reviewing patients medical record,labs, counseling patient regarding patient's conditions, any medications, answering questions regarding health, and coordination of care as needed. After visit summary patient given copy and reviewed.     Marcille Buffy, [place] [phone] (phone) [phone] (fax)  [place]

## 2020-02-13 NOTE — Telephone Encounter (Signed)
-----   Message from Loel Dubonnet, NP sent at 02/13/2020 12:06 PM EST ----- Regarding: Fyi on visit with PCP Received note from PCP's office who saw Scott [name] today for dizziness, vertigo, lightheadedness. At our last clinic visit we started Bisoprolol and he has been advised to hold this medication which I agree with. I reviewed his EKG from clinic visit today with PCP office and it showed NSR with no acute ST/T wave changes. He was advised to call our office regarding follow up.  He has an echocardiogram scheduled 02/20/20 and follow up with me 02/27/20. I think the best course of action is to proceed with echocardiogram as scheduled and follow up after echocardiogram. If he wishes to move the echo earlier and there is availability, that is fine. If he wishes to move his office follow up to sooner that is fine, but please schedule at least 24 hours after echocardiogram so we have results at time of office visit.   Just wanted to give a heads up in case he calls.   Thanks so much,  Loel Dubonnet, NP

## 2020-02-13 NOTE — Telephone Encounter (Signed)
Noted  

## 2020-02-14 ENCOUNTER — Encounter: Payer: Self-pay | Admitting: Gastroenterology

## 2020-02-14 ENCOUNTER — Telehealth: Payer: Self-pay

## 2020-02-14 ENCOUNTER — Ambulatory Visit: Payer: Medicare Other | Admitting: Gastroenterology

## 2020-02-14 NOTE — Telephone Encounter (Signed)
-----   Message from Storm Frisk, [place] sent at 02/14/2020  4:19 PM EST ----- Regarding: Patient call Caryl Pina,  Patient's fiancee called to say they would like to reschedule EGD but I don't see anything scheduled. He was suppose to have a new patient visit today but was a no show. He can be reached at [phone].    -Jovon

## 2020-02-14 NOTE — Progress Notes (Signed)
Stable chest x ray from previous. Keep pulmonary follow up as scheduled and still needs follow up with cardiologist.

## 2020-02-14 NOTE — Telephone Encounter (Signed)
Patient states that he does not know what she is talking about having the EGD. He states that his constipation is improved and does not think he needs a appointment anymore. He states he will call back if he has any problems.

## 2020-02-15 ENCOUNTER — Telehealth: Payer: Self-pay | Admitting: Cardiovascular Disease

## 2020-02-15 NOTE — Telephone Encounter (Signed)
Spoke with the patient. Patient sts that he has been checking his HR with a pulse ox. Pt sts that his HR has been running in the 80's with out the bisoprolol.  Today his resting HR is in the 120's bpm.  Discussed with Laurann Montana, NP. Patient should resume bisoprolol 2.5 mg daily. He confirms that he was previously taking just a 1/2 tab of the 5 mg not the whole. He will take 2.5 mg of bisoprolol now. Patient had previously reported HR at home in the 50's bpm and self held the medication.  Tomorrow and going fwd. If his HR is <60bpm he should hold bisoprolol, it it is >60bpm he should take the medication. Adv the patient to keep his upcoming echo and f/u appt. He should contact the office sooner if needed. Patient verbalized understanding.

## 2020-02-15 NOTE — Telephone Encounter (Signed)
Patient calling  States that he stopped the metoprolol medication but his HR is still up (120 resting) Please call to discuss

## 2020-02-18 NOTE — Progress Notes (Deleted)
Acute Office Visit  Subjective:    Patient ID: Scott [name], male    DOB: April 09, 1970, 49 y.o.   MRN: [id number]  No chief complaint on file.   HPI Patient is in today for possible ear impaction.  Past Medical History:  Diagnosis Date  . Acid reflux   . Anxiety   . Arthritis   . Asthma   . CHF (congestive heart failure) ([place])   . Depression   . Glaucoma   . Hyperlipidemia   . Hypertension   . Klippel-Feil syndrome     Past Surgical History:  Procedure Laterality Date  . COLOSTOMY    . DENVER SHUNT PLACEMENT      Family History  Problem Relation Age of Onset  . Multiple sclerosis Mother   . Colon cancer Father   . Heart disease Father   . Lung cancer Maternal Grandmother   . Thyroid cancer Maternal Grandfather   . Ovarian cancer Paternal Grandmother   . Cancer Paternal Grandfather     Social History   Socioeconomic History  . Marital status: Single    Spouse name: Not on file  . Number of children: 0  . Years of education: Not on file  . Highest education level: Some college, no degree  Occupational History  . Occupation: disability  Tobacco Use  . Smoking status: Former Smoker    Packs/day: 1.00    Years: 10.00    Pack years: 10.00    Types: Cigarettes    Quit date: 05/2006    Years since quitting: 13.7  . Smokeless tobacco: Never Used  . Tobacco comment: quit 05/2006  Vaping Use  . Vaping Use: Former  . Devices: tried once  Substance and Sexual Activity  . Alcohol use: No    Alcohol/week: 0.0 standard drinks  . Drug use: No  . Sexual activity: Not on file  Other Topics Concern  . Not on file  Social History Narrative  . Not on file   Social Determinants of Health   Financial Resource Strain: Medium Risk  . Difficulty of Paying Living Expenses: Somewhat hard  Food Insecurity: No Food Insecurity  . Worried About Charity fundraiser in the Last Year: Never true  . Ran Out of Food in the Last Year: Never true  Transportation Needs: No  Transportation Needs  . Lack of Transportation (Medical): No  . Lack of Transportation (Non-Medical): No  Physical Activity: Inactive  . Days of Exercise per Week: 0 days  . Minutes of Exercise per Session: 0 min  Stress: Stress Concern Present  . Feeling of Stress : To some extent  Social Connections: Moderately Isolated  . Frequency of Communication with Friends and Family: Three times a week  . Frequency of Social Gatherings with Friends and Family: Never  . Attends Religious Services: Never  . Active Member of Clubs or Organizations: No  . Attends Archivist Meetings: Never  . Marital Status: Living with partner  Intimate Partner Violence: Not At Risk  . Fear of Current or Ex-Partner: No  . Emotionally Abused: No  . Physically Abused: No  . Sexually Abused: No    Outpatient Medications Prior to Visit  Medication Sig Dispense Refill  . albuterol (VENTOLIN HFA) 108 (90 Base) MCG/ACT inhaler Inhale 2 puffs into the lungs every 4 (four) hours as needed for wheezing or shortness of breath. 1 each 1  . ANDROGEL PUMP 20.25 MG/ACT (1.62%) GEL Apply 1 application topically daily.     Marland Kitchen  bisoprolol (ZEBETA) 5 MG tablet Take 0.5 tablets (2.5 mg total) by mouth daily. (Patient not taking: Reported on 02/13/2020) 15 tablet 2  . dapagliflozin propanediol (FARXIGA) 5 MG TABS tablet Take 1 tablet (5 mg total) by mouth daily before breakfast. 14 tablet 0  . glucosamine-chondroitin 500-400 MG tablet Take 2 tablets by mouth daily.    . metolazone (ZAROXOLYN) 2.5 MG tablet Take 1 tablet (2.5 mg total) by mouth 3 (three) times a week. 40 tablet 1  . PARoxetine (PAXIL) 40 MG tablet Take 1 tablet (40 mg total) by mouth daily. 30 tablet 11  . potassium chloride SA (KLOR-CON) 20 MEQ tablet TAKE 2 TABLETS(40 MEQ) BY MOUTH TWICE DAILY 360 tablet 0  . spironolactone (ALDACTONE) 50 MG tablet Take 50 mg by mouth daily.    Marland Kitchen torsemide (DEMADEX) 20 MG tablet Take 3 tablets (60 mg total) by mouth 2  (two) times daily. 270 tablet 2   No facility-administered medications prior to visit.    Allergies  Allergen Reactions  . Codeine Shortness Of Breath    Review of Systems     Objective:    Physical Exam  There were no vitals taken for this visit. Wt Readings from Last 3 Encounters:  02/13/20 213 lb 9.6 oz (96.9 kg)  02/08/20 214 lb 3.2 oz (97.2 kg)  01/24/20 210 lb (95.3 kg)    Health Maintenance Due  Topic Date Due  . Hepatitis C Screening  Never done  . URINE MICROALBUMIN  Never done  . INFLUENZA VACCINE  Never done    There are no preventive care reminders to display for this patient.   Lab Results  Component Value Date   TSH 2.826 02/04/2018   Lab Results  Component Value Date   WBC 9.9 02/08/2020   HGB 13.9 02/08/2020   HCT 41.2 02/08/2020   MCV 92 02/08/2020   PLT 296 02/08/2020   Lab Results  Component Value Date   NA 139 02/08/2020   K 4.3 02/08/2020   CO2 39 (H) 02/08/2020   GLUCOSE 182 (H) 02/08/2020   BUN 20 02/08/2020   CREATININE 1.31 (H) 02/08/2020   BILITOT <0.2 01/18/2020   ALKPHOS 100 01/18/2020   AST 21 01/18/2020   ALT 47 (H) 01/18/2020   PROT 7.1 01/18/2020   ALBUMIN 4.1 01/18/2020   CALCIUM 10.5 (H) 02/08/2020   ANIONGAP 10 01/06/2020   Lab Results  Component Value Date   CHOL 155 02/07/2018   Lab Results  Component Value Date   HDL 55 02/07/2018   Lab Results  Component Value Date   LDLCALC 85 02/07/2018   Lab Results  Component Value Date   TRIG 74 02/07/2018   Lab Results  Component Value Date   CHOLHDL 2.8 02/07/2018   Lab Results  Component Value Date   HGBA1C 8.6 (H) 01/24/2020       Assessment & Plan:   Problem List Items Addressed This Visit    None       No orders of the defined types were placed in this encounter.    Juluis Mire, CMA

## 2020-02-19 ENCOUNTER — Ambulatory Visit: Payer: Self-pay | Admitting: Family Medicine

## 2020-02-20 ENCOUNTER — Ambulatory Visit (INDEPENDENT_AMBULATORY_CARE_PROVIDER_SITE_OTHER): Payer: Medicare Other

## 2020-02-20 ENCOUNTER — Telehealth: Payer: Self-pay | Admitting: *Deleted

## 2020-02-20 ENCOUNTER — Other Ambulatory Visit: Payer: Self-pay

## 2020-02-20 DIAGNOSIS — J984 Other disorders of lung: Secondary | ICD-10-CM | POA: Diagnosis not present

## 2020-02-20 DIAGNOSIS — I5032 Chronic diastolic (congestive) heart failure: Secondary | ICD-10-CM | POA: Diagnosis not present

## 2020-02-20 LAB — ECHOCARDIOGRAM COMPLETE
AR max vel: 1.95 cm2
AV Area VTI: 2.39 cm2
AV Area mean vel: 1.93 cm2
AV Mean grad: 4 mmHg
AV Peak grad: 7.3 mmHg
Ao pk vel: 1.35 m/s
Area-P 1/2: 3.54 cm2
Calc EF: 45.3 %
S' Lateral: 2.7 cm
Single Plane A2C EF: 47.4 %
Single Plane A4C EF: 45.5 %

## 2020-02-20 NOTE — Telephone Encounter (Signed)
-----   Message from Rise Mu, PA-C sent at 02/20/2020  3:18 PM EST ----- Echo showed normal pump function, normal wall motion, normal LV diastolic function, normal right side pump function, and no significant valvular abnormality.  Right atrial pressure was mildly elevated otherwise, very reassuring echo.  This supports his dyspnea is likely noncardiac in etiology and recommend he establish with pulmonology as previously directed.

## 2020-02-20 NOTE — Telephone Encounter (Signed)
The patient has been notified of the result and verbalized understanding.  All questions (if any) were answered. Pt confirms he has an appt with [facility] Pulmon[facility] 04/06/2020.

## 2020-02-21 ENCOUNTER — Telehealth: Payer: Self-pay | Admitting: Cardiovascular Disease

## 2020-02-21 NOTE — Telephone Encounter (Signed)
Only need one appointment post echo. His echocardiogram result was good and has been reviewed with him. He may keep whichever of the two is better for him!   If he needs to reschedule. Recommend visit within 3 weeks to discuss echo and his Bisoprolol.  Loel Dubonnet, NP

## 2020-02-21 NOTE — Telephone Encounter (Signed)
Hello Scott [name],  This patient has an appointment scheduled with you for 02/22/20 & 02/27/20.  Which would you prefer he keep?   Thanks!

## 2020-02-21 NOTE — Telephone Encounter (Signed)
Patient calling  Would like to know if his appointment with C Walker tomorrow 02/22/20 at 3:30p is necessary Please call to discuss

## 2020-02-21 NOTE — Telephone Encounter (Signed)
I spoke with the patient. I have advised him of Caitlin's recommendations as stated below.  The patient would like to cancel his appointment for tomorrow and keep the appointment scheduled for 02/27/20 at 11:30 am.   I have cancelled his appointment in Epic for 11/19.

## 2020-02-22 ENCOUNTER — Ambulatory Visit: Payer: Medicare Other | Admitting: Family

## 2020-02-27 ENCOUNTER — Encounter: Payer: Self-pay | Admitting: Family

## 2020-02-27 ENCOUNTER — Ambulatory Visit (INDEPENDENT_AMBULATORY_CARE_PROVIDER_SITE_OTHER): Payer: Medicare Other | Admitting: Family

## 2020-02-27 ENCOUNTER — Other Ambulatory Visit: Payer: Self-pay

## 2020-02-27 VITALS — BP 112/76 | HR 96 | Ht 62.0 in | Wt 212.0 lb

## 2020-02-27 DIAGNOSIS — R000 Tachycardia, unspecified: Secondary | ICD-10-CM | POA: Diagnosis not present

## 2020-02-27 DIAGNOSIS — J984 Other disorders of lung: Secondary | ICD-10-CM

## 2020-02-27 DIAGNOSIS — J9611 Chronic respiratory failure with hypoxia: Secondary | ICD-10-CM

## 2020-02-27 DIAGNOSIS — Z79899 Other long term (current) drug therapy: Secondary | ICD-10-CM | POA: Diagnosis not present

## 2020-02-27 DIAGNOSIS — R0600 Dyspnea, unspecified: Secondary | ICD-10-CM

## 2020-02-27 DIAGNOSIS — R0609 Other forms of dyspnea: Secondary | ICD-10-CM

## 2020-02-27 DIAGNOSIS — I5032 Chronic diastolic (congestive) heart failure: Secondary | ICD-10-CM | POA: Diagnosis not present

## 2020-02-27 MED ORDER — BISOPROLOL FUMARATE 5 MG PO TABS: 2.5000 mg | ORAL_TABLET | ORAL | 1 refills | Status: DC

## 2020-02-27 MED ORDER — SPIRONOLACTONE 50 MG PO TABS: 50.0000 mg | ORAL_TABLET | Freq: Every day | ORAL | 1 refills | Status: DC

## 2020-02-27 NOTE — Progress Notes (Signed)
Office Visit    Patient Name: [name] Date of Encounter: 02/27/2020  Primary Care Provider:  Margo Common, [place] Primary Cardiologist:  Ida Rogue, MD Electrophysiologist:  None   Chief Complaint    Scott [name] is a 49 y.o. male with a hx of diastolic heart failure, Klippel-Feil syndrome, restrictive lung disease, HTN, former tobacco use, HLD, asthma, depression, anxiety  presents today for follow up after echocardiogram.   Past Medical History    Past Medical History:  Diagnosis Date  . Acid reflux   . Anxiety   . Arthritis   . Asthma   . CHF (congestive heart failure) ([place])   . Depression   . Glaucoma   . Hyperlipidemia   . Hypertension   . Klippel-Feil syndrome    Past Surgical History:  Procedure Laterality Date  . COLOSTOMY    . DENVER SHUNT PLACEMENT      Allergies  Allergies  Allergen Reactions  . Codeine Shortness Of Breath    History of Present Illness    Scott [name] is a 49 y.o. male with a hx of diastolic heart failure, Klippel-Feil syndrome, restrictive lung disease, HTN, former tobacco use, HLD, asthma, depression, anxiety  last seen 01/24/20.  Echocardiogram 02/04/2018 LVEF 55 to 60%. Echocardiogram was performed during hospitalizations with acute on chronic diastolic heart failure with hypoxia requiring IV Lasix. Stress testing 02/09/2018 with no evidence of ischemia. Sleep study 03/10/2018 negative for OSA. Pulmonary function testing notable for severe restrictive lung disease.   Clinic visit 05/2019 he was recommended to have metolazone 2.5 mg twice a week for weight over 2 or 9 pounds.  He was also referred to pulmonology.  Unfortunately has previously been discharged from our pulmonology due to no-shows and has been waiting with neurology.  His clinic visits over the last few months have been notable for variable weight, swelling, and self adjustment of his diuretic.  He was also noted to be drinking lots of p.o. fluids including Gatorade  and soda and encouraged to discontinue.  Lab work has routinely shown stable BNP and BMP and as such his dyspnea was thought to be due to his restrictive lung disease.  Admitted 12/19/2019-01/05/2020 for COPD exacerbation.  Bilateral ultrasounds were negative for DVT.  D-dimer was normal.  When seen in follow-up 01/24/2020 he was noted tachycardia and bisoprolol 2.5 mg daily was started.  Echocardiogram 02/20/2020 with normal LVEF, normal wall motion, normal LV diastolic dysfunction, RV normal size and pressure, no significant valvular normality.  Right atrial pressure mildly elevated at 8 mmHg.  Reports today for follow-up.  Reviewed echocardiogram.  Reassurance provided regarding heart function.  He has upcoming appointment with [facility] pulmonology in January.  He does note he has been taking his bisoprolol "as -needed".  Tells me this morning he woke up feeling lightheaded and weak.  He has not yet taken his medications this morning.  He has not been taking his metolazone as he feels his fluid volume is well controlled without it after he is reduced his intake of soda and Gatorade.  He is taking torsemide 60 mg twice daily as directed.  He notes his heart rate is well controlled while taking the bisoprolol but will be elevated when he does not take it.  Tells me his blood pressure readings are "normal" at home but unable to provide values.  EKGs/Labs/Other Studies Reviewed:   The following studies were reviewed today:  Echo 02/20/20  1. Left ventricular ejection fraction,  by estimation, is 60 to 65%. The  left ventricle has normal function. The left ventricle has no regional  wall motion abnormalities. Left ventricular diastolic parameters were  normal.   2. Right ventricular systolic function is normal. The right ventricular  size is normal.   3. The mitral valve is normal in structure. No evidence of mitral valve  regurgitation. No evidence of mitral stenosis.   4. The aortic valve is normal in  structure. Aortic valve regurgitation is  not visualized. No aortic stenosis is present.   5. The inferior vena cava is dilated in size with >50% respiratory  variability, suggesting right atrial pressure of 8 mmHg.    EKG: No EKG today.  Recent Labs: 01/18/2020: ALT 47 01/24/2020: BNP 20.7 02/08/2020: BUN 20; Creatinine, Ser 1.31; Hemoglobin 13.9; Platelets 296; Potassium 4.3; Sodium 139  Recent Lipid Panel    Component Value Date/Time   CHOL 155 02/07/2018 0508   TRIG 74 02/07/2018 0508   HDL 55 02/07/2018 0508   CHOLHDL 2.8 02/07/2018 0508   VLDL 15 02/07/2018 0508   LDLCALC 85 02/07/2018 0508    Home Medications   Current Meds  Medication Sig  . albuterol (VENTOLIN HFA) 108 (90 Base) MCG/ACT inhaler Inhale 2 puffs into the lungs every 4 (four) hours as needed for wheezing or shortness of breath.  . ANDROGEL PUMP 20.25 MG/ACT (1.62%) GEL Apply 1 application topically daily.   . bisoprolol (ZEBETA) 5 MG tablet Take 0.5 tablets (2.5 mg total) by mouth every other day.  . dapagliflozin propanediol (FARXIGA) 5 MG TABS tablet Take 1 tablet (5 mg total) by mouth daily before breakfast.  . glucosamine-chondroitin 500-400 MG tablet Take 2 tablets by mouth daily.  Marland Kitchen PARoxetine (PAXIL) 40 MG tablet Take 1 tablet (40 mg total) by mouth daily.  . potassium chloride SA (KLOR-CON) 20 MEQ tablet TAKE 2 TABLETS(40 MEQ) BY MOUTH TWICE DAILY  . spironolactone (ALDACTONE) 50 MG tablet Take 1 tablet (50 mg total) by mouth daily.  Marland Kitchen torsemide (DEMADEX) 20 MG tablet Take 3 tablets (60 mg total) by mouth 2 (two) times daily.  . [DISCONTINUED] bisoprolol (ZEBETA) 5 MG tablet Take 0.5 tablets (2.5 mg total) by mouth daily.  . [DISCONTINUED] spironolactone (ALDACTONE) 50 MG tablet Take 50 mg by mouth daily.     Review of Systems  All other systems reviewed and are otherwise negative except as noted above.  Physical Exam    VS:  BP 112/76 (BP Location: Left Arm, Patient Position: Sitting, Cuff  Size: Normal)   Pulse 96   Ht 5\' 2"  (1.575 m)   Wt 212 lb (96.2 kg)   SpO2 90%   BMI 38.78 kg/m  , BMI Body mass index is 38.78 kg/m.  Wt Readings from Last 3 Encounters:  02/27/20 212 lb (96.2 kg)  02/13/20 213 lb 9.6 oz (96.9 kg)  02/08/20 214 lb 3.2 oz (97.2 kg)    GEN: Well nourished, well developed, in no acute distress. HEENT: normal. Neck: Supple, no JVD, carotid bruits, or masses. Cardiac: RRR, no murmurs, rubs, or gallops. No clubbing, cyanosis, edema.  Radials/DP/PT 2+ and equal bilaterally.  Respiratory:  Respirations regular and unlabored, clear to auscultation bilaterally. GI: Soft, nontender, nondistended. MS: No deformity or atrophy. Skin: Warm and dry, no rash. Neuro:  Strength and sensation are intact. Psych: Normal affect.  Assessment & Plan    1. Diastolic heart failure-recent echo 02/20/2020 with normal LVEF, normal diastolic function, no significant valvular abnormalities, mildly elevated RA  pressure 64mmHg.  Euvolemic and well compensated on exam.  Continue torsemide 60 mg twice daily.  As he is not presently taking metolazone remaining euvolemic, reasonable to use metolazone only as needed for weight gain of 3 pounds overnight or 5 pounds in 1 week.  He is taking Bisoprolol sporadically but was agreeable to continue to taking Bisoprolol 2.5 mg every other day. He is nearing end of his Spironolactone, will refill Spironolactone. Low salt, heart healthy diet, and fluid restriction encouraged. Lab work today including CMP, BNP, CBC which he will have colleted at the [facility] at his convenience.   2. Restrictive lung disease -cardiac work-up reassuring anticipate this is the main driver of his dyspnea on exertion.  Encouraged to keep his upcoming appoint with pulmonology in January.  3. DM2 - Follow-up regarding Farxiga samples with primary care provider.  Disposition: Follow up in 4 month(s) with Dr. Rockey Situ or APP   Signed, Loel Dubonnet, NP 02/27/2020,  1:18 PM [place]

## 2020-02-27 NOTE — Patient Instructions (Addendum)
Medication Instructions:  Your physician has recommended you make the following change in your medication:  CHANGE Bisoprolol to 2.5mg  (half tablet)  every other day  *If you need a refill on your cardiac medications before your next appointment, please call your pharmacy*   Lab Work: Your provider recommends that you return for lab work today: CMP, CBC, BNP  If you have labs (blood work) drawn today and your tests are completely normal, you will receive your results only by: Marland Kitchen MyChart Message (if you have MyChart) OR . A paper copy in the mail If you have any lab test that is abnormal or we need to change your treatment, we will call you to review the results.  Testing/Procedures: None ordered today. Your echocardiogram looks good!  Follow-Up: At [facility], you and your health needs are our priority.  As part of our continuing mission to provide you with exceptional heart care, we have created designated Provider Care Teams.  These Care Teams include your primary Cardiologist (physician) and Advanced Practice Providers (APPs -  Physician Assistants and Nurse Practitioners) who all work together to provide you with the care you need, when you need it.  We recommend signing up for the patient portal called "MyChart".  Sign up information is provided on this After Visit Summary.  MyChart is used to connect with patients for Virtual Visits (Telemedicine).  Patients are able to view lab/test results, encounter notes, upcoming appointments, etc.  Non-urgent messages can be sent to your provider as well.   To learn more about what you can do with MyChart, go to [email or web address].    Your next appointment:   4 month(s)  The format for your next appointment:   In Person  Provider:   You may see Ida Rogue, MD or one of the following Advanced Practice Providers on your designated Care Team:    Murray Hodgkins, NP  Christell Faith, PA-C  Marrianne Mood, PA-C  [place]  Kathlen Mody, [place]  Laurann [place], NP   Other Instructions:   Call your primary care doctor about your Wilder Glade to request refill.   Follow up with pulmonology in January as scheduled.    Diabetes Mellitus and Nutrition, Adult When you have diabetes (diabetes mellitus), it is very important to have healthy eating habits because your blood sugar (glucose) levels are greatly affected by what you eat and drink. Eating healthy foods in the appropriate amounts, at about the same times every day, can help you:  Control your blood glucose.  Lower your risk of heart disease.  Improve your blood pressure.  Reach or maintain a healthy weight. Every person with diabetes is different, and each person has different needs for a meal plan. Your health care provider may recommend that you work with a diet and nutrition specialist (dietitian) to make a meal plan that is best for you. Your meal plan may vary depending on factors such as:  The calories you need.  The medicines you take.  Your weight.  Your blood glucose, blood pressure, and cholesterol levels.  Your activity level.  Other health conditions you have, such as heart or kidney disease. How do carbohydrates affect me? Carbohydrates, also called carbs, affect your blood glucose level more than any other type of food. Eating carbs naturally raises the amount of glucose in your blood. Carb counting is a method for keeping track of how many carbs you eat. Counting carbs is important to keep your blood glucose at a healthy level, especially if you  use insulin or take certain oral diabetes medicines. It is important to know how many carbs you can safely have in each meal. This is different for every person. Your dietitian can help you calculate how many carbs you should have at each meal and for each snack. Foods that contain carbs include:  Bread, cereal, rice, pasta, and crackers.  Potatoes and corn.  Peas, beans, and lentils.  Milk and  yogurt.  Fruit and juice.  Desserts, such as cakes, cookies, ice cream, and candy. How does alcohol affect me? Alcohol can cause a sudden decrease in blood glucose (hypoglycemia), especially if you use insulin or take certain oral diabetes medicines. Hypoglycemia can be a life-threatening condition. Symptoms of hypoglycemia (sleepiness, dizziness, and confusion) are similar to symptoms of having too much alcohol. If your health care provider says that alcohol is safe for you, follow these guidelines:  Limit alcohol intake to no more than 1 drink per day for nonpregnant women and 2 drinks per day for men. One drink equals 12 oz of beer, 5 oz of wine, or 1 oz of hard liquor.  Do not drink on an empty stomach.  Keep yourself hydrated with water, diet soda, or unsweetened iced tea.  Keep in mind that regular soda, juice, and other mixers may contain a lot of sugar and must be counted as carbs. What are tips for following this plan?  Reading food labels  Start by checking the serving size on the "Nutrition Facts" label of packaged foods and drinks. The amount of calories, carbs, fats, and other nutrients listed on the label is based on one serving of the item. Many items contain more than one serving per package.  Check the total grams (g) of carbs in one serving. You can calculate the number of servings of carbs in one serving by dividing the total carbs by 15. For example, if a food has 30 g of total carbs, it would be equal to 2 servings of carbs.  Check the number of grams (g) of saturated and trans fats in one serving. Choose foods that have low or no amount of these fats.  Check the number of milligrams (mg) of salt (sodium) in one serving. Most people should limit total sodium intake to less than 2,300 mg per day.  Always check the nutrition information of foods labeled as "low-fat" or "nonfat". These foods may be higher in added sugar or refined carbs and should be avoided.  Talk to  your dietitian to identify your daily goals for nutrients listed on the label. Shopping  Avoid buying canned, premade, or processed foods. These foods tend to be high in fat, sodium, and added sugar.  Shop around the outside edge of the grocery store. This includes fresh fruits and vegetables, bulk grains, fresh meats, and fresh dairy. Cooking  Use low-heat cooking methods, such as baking, instead of high-heat cooking methods like deep frying.  Cook using healthy oils, such as olive, canola, or sunflower oil.  Avoid cooking with butter, cream, or high-fat meats. Meal planning  Eat meals and snacks regularly, preferably at the same times every day. Avoid going long periods of time without eating.  Eat foods high in fiber, such as fresh fruits, vegetables, beans, and whole grains. Talk to your dietitian about how many servings of carbs you can eat at each meal.  Eat 4-6 ounces (oz) of lean protein each day, such as lean meat, chicken, fish, eggs, or tofu. One oz of lean protein is  equal to: ? 1 oz of meat, chicken, or fish. ? 1 egg. ?  cup of tofu.  Eat some foods each day that contain healthy fats, such as avocado, nuts, seeds, and fish. Lifestyle  Check your blood glucose regularly.  Exercise regularly as told by your health care provider. This may include: ? 150 minutes of moderate-intensity or vigorous-intensity exercise each week. This could be brisk walking, biking, or water aerobics. ? Stretching and doing strength exercises, such as yoga or weightlifting, at least 2 times a week.  Take medicines as told by your health care provider.  Do not use any products that contain nicotine or tobacco, such as cigarettes and e-cigarettes. If you need help quitting, ask your health care provider.  Work with a Social worker or diabetes educator to identify strategies to manage stress and any emotional and social challenges. Questions to ask a health care provider  Do I need to meet with  a diabetes educator?  Do I need to meet with a dietitian?  What number can I call if I have questions?  When are the best times to check my blood glucose? Where to find more information:  American Diabetes Association: diabetes.org  Academy of Nutrition and Dietetics: www.eatright.[employer] of Diabetes and Digestive and Kidney Diseases (NIH): [employer] Summary  A healthy meal plan will help you control your blood glucose and maintain a healthy lifestyle.  Working with a diet and nutrition specialist (dietitian) can help you make a meal plan that is best for you.  Keep in mind that carbohydrates (carbs) and alcohol have immediate effects on your blood glucose levels. It is important to count carbs and to use alcohol carefully. This information is not intended to replace advice given to you by your health care provider. Make sure you discuss any questions you have with your health care provider. Document Revised: 03/04/2017 Document Reviewed: 04/26/2016 Elsevier Patient Education  2020 [employer].

## 2020-02-29 ENCOUNTER — Other Ambulatory Visit: Payer: Self-pay | Admitting: Family Medicine

## 2020-02-29 DIAGNOSIS — I5032 Chronic diastolic (congestive) heart failure: Secondary | ICD-10-CM

## 2020-02-29 DIAGNOSIS — E1169 Type 2 diabetes mellitus with other specified complication: Secondary | ICD-10-CM

## 2020-02-29 NOTE — Telephone Encounter (Signed)
Requested medication (s) are due for refill today:  Yes  Requested medication (s) are on the active medication list:   Yes  Future visit scheduled:   Yes   Last ordered: 02/08/2020 #14,  Sample was given  Returned because this med. Is not on his formulary.   Requested Prescriptions  Pending Prescriptions Disp Refills   dapagliflozin propanediol (FARXIGA) 5 MG TABS tablet 14 tablet 0    Sig: Take 1 tablet (5 mg total) by mouth daily before breakfast.      Endocrinology:  Diabetes - SGLT2 Inhibitors Failed - 02/29/2020 12:33 PM      Failed - Cr in normal range and within 360 days    Creatinine, Ser  Date Value Ref Range Status  02/08/2020 1.31 (H) 0.76 - 1.27 mg/dL Final          Failed - LDL in normal range and within 360 days    LDL Cholesterol  Date Value Ref Range Status  02/07/2018 85 0 - 99 mg/dL Final    Comment:           Total Cholesterol/HDL:CHD Risk Coronary Heart Disease Risk Table                     Men   Women  1/2 Average Risk   3.4   3.3  Average Risk       5.0   4.4  2 X Average Risk   9.6   7.1  3 X Average Risk  23.4   11.0        Use the calculated Patient Ratio above and the CHD Risk Table to determine the patient's CHD Risk.        ATP III CLASSIFICATION (LDL):  <100     mg/dL   Optimal  100-129  mg/dL   Near or Above                    Optimal  130-159  mg/dL   Borderline  160-189  mg/dL   High  >190     mg/dL   Very High Performed at [facility], [address] [place], [place] [address]           Failed - HBA1C is between 0 and 7.9 and within 180 days    Hgb A1c MFr Bld  Date Value Ref Range Status  01/24/2020 8.6 (H) 4.8 - 5.6 % Final    Comment:             Prediabetes: 5.7 - 6.4          Diabetes: >6.4          Glycemic control for adults with diabetes: <7.0           Passed - eGFR in normal range and within 360 days    GFR calc Af Amer  Date Value Ref Range Status  02/08/2020 73 >59 mL/min/1.73 Final     Comment:    **In accordance with recommendations from the NKF-ASN Task force,**   [facility] is in the process of updating its eGFR calculation to the   2021 CKD-EPI creatinine equation that estimates kidney function   without a race variable.    GFR calc non Af Amer  Date Value Ref Range Status  02/08/2020 63 >59 mL/min/1.73 Final          Passed - Valid encounter within last 6 months    Recent Outpatient Visits  2 weeks ago History of bradycardia   [place], FNP   3 weeks ago Chronic diastolic heart failure [facility])   [place], [place], [place]   1 month ago Acute on chronic respiratory failure with hypoxia [facility])   [place], [place], [place]   1 month ago Acute on chronic respiratory failure with hypoxia [facility])   [place], [place]   3 months ago Generalized abdominal pain   [facility] Birdie Sons, MD       Future Appointments             In 2 months [place], [name], [place], [place]   In 4 months [place], Kathlene November, MD [place], [place]

## 2020-02-29 NOTE — Telephone Encounter (Signed)
Medication Refill - Medication: Wilder Glade  Has the patient contacted their pharmacy? No. (Agent: If no, request that the patient contact the pharmacy for the refill.) (Agent: If yes, when and what did the pharmacy advise?)  Preferred Pharmacy (with phone number or street name): [facility]  Agent: Please be advised that RX refills may take up to 3 business days. We ask that you follow-up with your pharmacy.

## 2020-03-03 ENCOUNTER — Other Ambulatory Visit: Payer: Self-pay | Admitting: Family Medicine

## 2020-03-03 DIAGNOSIS — I5032 Chronic diastolic (congestive) heart failure: Secondary | ICD-10-CM

## 2020-03-03 DIAGNOSIS — E1169 Type 2 diabetes mellitus with other specified complication: Secondary | ICD-10-CM

## 2020-03-03 MED ORDER — DAPAGLIFLOZIN PROPANEDIOL 5 MG PO TABS: 5.0000 mg | ORAL_TABLET | Freq: Every day | ORAL | 0 refills | Status: DC

## 2020-03-03 NOTE — Addendum Note (Signed)
Addended by: Marijo Conception on: 03/03/2020 12:47 PM   Modules accepted: Orders

## 2020-03-03 NOTE — Telephone Encounter (Signed)
PT need a refill  dapagliflozin propanediol (FARXIGA) 5 MG TABS tablet [[id number] [id number] - [address], [place] AT [facility] [address]  [place] [address]  Phone: [phone] Fax: [phone]

## 2020-03-04 ENCOUNTER — Telehealth: Payer: Self-pay

## 2020-03-04 DIAGNOSIS — I5033 Acute on chronic diastolic (congestive) heart failure: Secondary | ICD-10-CM | POA: Diagnosis not present

## 2020-03-04 DIAGNOSIS — N179 Acute kidney failure, unspecified: Secondary | ICD-10-CM | POA: Diagnosis not present

## 2020-03-04 DIAGNOSIS — J9621 Acute and chronic respiratory failure with hypoxia: Secondary | ICD-10-CM | POA: Diagnosis not present

## 2020-03-04 DIAGNOSIS — N182 Chronic kidney disease, stage 2 (mild): Secondary | ICD-10-CM | POA: Diagnosis not present

## 2020-03-04 DIAGNOSIS — J441 Chronic obstructive pulmonary disease with (acute) exacerbation: Secondary | ICD-10-CM | POA: Diagnosis not present

## 2020-03-04 DIAGNOSIS — I130 Hypertensive heart and chronic kidney disease with heart failure and stage 1 through stage 4 chronic kidney disease, or unspecified chronic kidney disease: Secondary | ICD-10-CM | POA: Diagnosis not present

## 2020-03-04 NOTE — Telephone Encounter (Signed)
May increase Farxiga to 10 mg (2 five mg tablets) once a day. Must schedule follow up appointment in a week and bring glucometer to get read out of recent sugar measurements.

## 2020-03-04 NOTE — Telephone Encounter (Signed)
Copied from [place] [id number]. Topic: Quick Communication - See Telephone Encounter >> Mar 04, 2020  4:44 PM [place] wrote: CRM for notification. See Telephone encounter for: 03/04/20.dapagliflozin propanediol (FARXIGA) 5 MG TABS tablet Medication Date: 03/03/2020 Department: [facility] Ordering/Authorizing: Margo Common, PA Pt has been out of this med for several days, now has gotten and BS is 287 and wants to know if can take one tonite and start back regular again in the am with another NT did not feel comfortable advising   pls call pt at [phone]

## 2020-03-05 NOTE — Telephone Encounter (Signed)
Patient advised and appointment made 

## 2020-03-07 ENCOUNTER — Encounter: Payer: Self-pay | Admitting: Family Medicine

## 2020-03-07 ENCOUNTER — Other Ambulatory Visit: Payer: Self-pay

## 2020-03-07 ENCOUNTER — Ambulatory Visit (INDEPENDENT_AMBULATORY_CARE_PROVIDER_SITE_OTHER): Payer: Medicare Other | Admitting: Family Medicine

## 2020-03-07 ENCOUNTER — Other Ambulatory Visit: Payer: Self-pay | Admitting: Cardiovascular Disease

## 2020-03-07 VITALS — BP 123/78 | HR 91 | Temp 99.0°F | Resp 18 | Wt 213.0 lb

## 2020-03-07 DIAGNOSIS — J9611 Chronic respiratory failure with hypoxia: Secondary | ICD-10-CM | POA: Diagnosis not present

## 2020-03-07 DIAGNOSIS — I5032 Chronic diastolic (congestive) heart failure: Secondary | ICD-10-CM

## 2020-03-07 DIAGNOSIS — E1169 Type 2 diabetes mellitus with other specified complication: Secondary | ICD-10-CM | POA: Diagnosis not present

## 2020-03-07 MED ORDER — POTASSIUM CHLORIDE CRYS ER 20 MEQ PO TBCR: EXTENDED_RELEASE_TABLET | ORAL | 0 refills | Status: DC

## 2020-03-07 NOTE — Telephone Encounter (Signed)
Requested Prescriptions   Signed Prescriptions Disp Refills  . potassium chloride SA (KLOR-CON) 20 MEQ tablet 360 tablet 0    Sig: TAKE 2 TABLETS(40 MEQ) BY MOUTH TWICE DAILY    Authorizing Provider: Minna Merritts    Ordering User: Eugenio Hoes, Alzena Gerber C    Pt confirmed over the phone that he is taking Potassium 20 meq 2 tablets twice daily.

## 2020-03-07 NOTE — Telephone Encounter (Signed)
*  STAT* If patient is at the pharmacy, call can be transferred to refill team.   1. Which medications need to be refilled? (please list name of each medication and dose if known) potassium chloride 20 meq bid  2. Which pharmacy/location (including street and city if local pharmacy) is medication to be sent to? [facility] [place]  3. Do they need a 30 day or 90 day supply? [place]

## 2020-03-07 NOTE — Progress Notes (Signed)
I,April Miller,acting as a Education administrator for [employer]have documented all relevant documentation on the behalf of [employer],as directed by  [employer] while in the presence of [employer], [place].   Established patient visit   Patient: Scott [name]   DOB: Sep 15, 1970   49 y.o. Male  MRN: [id number] Visit Date: 03/07/2020  Today's healthcare provider: [name], PA   Chief Complaint  Patient presents with  . Diabetes   Subjective    HPI  Patient is here to discuss elevated blood sugars. Fasting sugar is 103 and non-fasting is 150-215.     Medications: Outpatient Medications Prior to Visit  Medication Sig  . albuterol (VENTOLIN HFA) 108 (90 Base) MCG/ACT inhaler Inhale 2 puffs into the lungs every 4 (four) hours as needed for wheezing or shortness of breath.  . ANDROGEL PUMP 20.25 MG/ACT (1.62%) GEL Apply 1 application topically daily.   . bisoprolol (ZEBETA) 5 MG tablet Take 0.5 tablets (2.5 mg total) by mouth every other day.  . dapagliflozin propanediol (FARXIGA) 5 MG TABS tablet Take 1 tablet (5 mg total) by mouth daily before breakfast.  . glucosamine-chondroitin 500-400 MG tablet Take 2 tablets by mouth daily.  Marland Kitchen PARoxetine (PAXIL) 40 MG tablet Take 1 tablet (40 mg total) by mouth daily.  . potassium chloride SA (KLOR-CON) 20 MEQ tablet TAKE 2 TABLETS(40 MEQ) BY MOUTH TWICE DAILY  . spironolactone (ALDACTONE) 50 MG tablet Take 1 tablet (50 mg total) by mouth daily.  Marland Kitchen torsemide (DEMADEX) 20 MG tablet Take 3 tablets (60 mg total) by mouth 2 (two) times daily.  . metolazone (ZAROXOLYN) 2.5 MG tablet Take 1 tablet (2.5 mg total) by mouth 3 (three) times a week. (Patient not taking: Reported on 02/27/2020)   No facility-administered medications prior to visit.    Review of Systems    Objective    BP 123/78 (BP Location: Right Arm, Patient Position: Sitting, Cuff Size: Large)   Pulse 91   Temp 99 F (37.2 C) (Oral)   Resp 18   Wt 213 lb  (96.6 kg)   SpO2 95%   BMI 38.96 kg/m  BP Readings from Last 3 Encounters:  03/07/20 123/78  02/27/20 112/76  02/13/20 117/75   Wt Readings from Last 3 Encounters:  03/07/20 213 lb (96.6 kg)  02/27/20 212 lb (96.2 kg)  02/13/20 213 lb 9.6 oz (96.9 kg)      Physical Exam Constitutional:      General: He is not in acute distress.    Appearance: He is well-developed.  HENT:     Head: Normocephalic and atraumatic.     Right Ear: Hearing normal.     Left Ear: Hearing normal.     Nose: Nose normal.  Eyes:     General: Lids are normal. No scleral icterus.       Right eye: No discharge.        Left eye: No discharge.     Conjunctiva/sclera: Conjunctivae normal.  Cardiovascular:     Rate and Rhythm: Normal rate and regular rhythm.     Heart sounds: Normal heart sounds.  Pulmonary:     Effort: Pulmonary effort is normal. No respiratory distress.     Breath sounds: Normal breath sounds.  Abdominal:     General: Bowel sounds are normal.     Palpations: Abdomen is soft.  Musculoskeletal:        General: No swelling.  Skin:    Findings: No lesion or  rash.  Neurological:     Mental Status: He is alert and oriented to person, place, and time.  Psychiatric:        Speech: Speech normal.        Behavior: Behavior normal.        Thought Content: Thought content normal.     Diabetic Foot Form - Detailed   Diabetic Foot Exam - detailed Diabetic Foot exam was performed with the following findings: Yes 03/07/2020  4:00 AM  Visual Foot Exam completed.: Yes  Can the patient see the bottom of their feet?: Yes Are the shoes appropriate in style and fit?: Yes Is there swelling or and abnormal foot shape?: No Is there a claw toe deformity?: No Is there elevated skin temparature?: No Is there foot or ankle muscle weakness?: No Normal Range of Motion: Yes Pulse Foot Exam completed.: Yes  Right posterior Tibialias: Present Left posterior Tibialias: Present  Right Dorsalis Pedis:  Present Left Dorsalis Pedis: Present  Sensory Foot Exam Completed.: Yes Semmes-Weinstein Monofilament Test R Site 1-Great Toe: Pos L Site 1-Great Toe: Pos          No results found for any visits on 03/07/20.  Assessment & Plan     1. Type 2 diabetes mellitus with other specified complication, without long-term current use of insulin (HCC) FBS in the 110 to 103 range at home on the Farxiga 5 mg qd. Normal foot exam except long thick nails and tingling sensations intermittently. Will continue present meds and schedule for podiatry evaluation. Proceed lab tests ordered by cardiologist, which will recheck kidneys and blood sugar. Schedule for Hgb A1C in 6-8 weeks.  2. Chronic diastolic heart failure (HCC) Echocardiogram on 02-20-20 showed LVEF of 60-65%. Still taking Torsemide 60 mg BID and Spironolactone 50 mg qd. No peripheral edema today. Stopped the [employer] for now. Continue follow up with Dr. Rockey Situ (cardiologist).  3. Chronic respiratory failure with hypoxia (HCC) Uses 2 LPM oxygen by nasal cannula at home full time. Keep follow up appointment with pulmonologist 04-08-19.   No follow-ups on file.      Andres Shad, PA, have reviewed all documentation for this visit. The documentation on 03/07/20 for the exam, diagnosis, procedures, and orders are all accurate and complete.    [name], [place] [phone] (phone) [phone] (fax)  [place]

## 2020-03-08 ENCOUNTER — Other Ambulatory Visit: Payer: Self-pay | Admitting: Physician Assistant

## 2020-03-13 ENCOUNTER — Other Ambulatory Visit: Payer: Self-pay

## 2020-03-13 ENCOUNTER — Ambulatory Visit (INDEPENDENT_AMBULATORY_CARE_PROVIDER_SITE_OTHER): Payer: Medicare Other | Admitting: Podiatry

## 2020-03-13 ENCOUNTER — Ambulatory Visit: Payer: Medicare Other | Admitting: Family Medicine

## 2020-03-13 ENCOUNTER — Encounter: Payer: Self-pay | Admitting: Podiatry

## 2020-03-13 DIAGNOSIS — M79675 Pain in left toe(s): Secondary | ICD-10-CM

## 2020-03-13 DIAGNOSIS — N179 Acute kidney failure, unspecified: Secondary | ICD-10-CM

## 2020-03-13 DIAGNOSIS — N182 Chronic kidney disease, stage 2 (mild): Secondary | ICD-10-CM

## 2020-03-13 DIAGNOSIS — B351 Tinea unguium: Secondary | ICD-10-CM | POA: Insufficient documentation

## 2020-03-13 DIAGNOSIS — E1142 Type 2 diabetes mellitus with diabetic polyneuropathy: Secondary | ICD-10-CM | POA: Insufficient documentation

## 2020-03-13 DIAGNOSIS — M79674 Pain in right toe(s): Secondary | ICD-10-CM | POA: Diagnosis not present

## 2020-03-13 DIAGNOSIS — E119 Type 2 diabetes mellitus without complications: Secondary | ICD-10-CM

## 2020-03-13 NOTE — Progress Notes (Signed)
This patient returns to my office for at risk foot care.  This patient requires this care by a professional since this patient will be at risk due to having kidney disease and diabetes type 2.  This patient is unable to cut nails himself since the patient cannot reach his nails.These nails are painful walking and wearing shoes.  This patient presents for at risk foot care today.  General Appearance  Alert, conversant and in no acute stress.  Vascular  Dorsalis pedis and posterior tibial  pulses are palpable  bilaterally.  Capillary return is within normal limits  bilaterally. Temperature is within normal limits  bilaterally.  Neurologic  Senn-Weinstein monofilament wire test within normal limits  bilaterally. Muscle power within normal limits bilaterally.  Nails Thick disfigured discolored nails with subungual debris hallux  Bilateral.. No evidence of bacterial infection or drainage bilaterally.  Orthopedic  No limitations of motion  feet .  No crepitus or effusions noted.  No bony pathology or digital deformities noted.  Skin  normotropic skin with no porokeratosis noted bilaterally.  No signs of infections or ulcers noted.     Onychomycosis  Pain in right toes  Pain in left toes  Consent was obtained for treatment procedures.   Mechanical debridement of nails 1-5  bilaterally performed with a nail nipper.  Filed with dremel without incident.    Return office visit   3 months                   Told patient to return for periodic foot care and evaluation due to potential at risk complications.   Gardiner Barefoot DPM

## 2020-03-31 ENCOUNTER — Ambulatory Visit: Payer: Self-pay

## 2020-03-31 DIAGNOSIS — I5032 Chronic diastolic (congestive) heart failure: Secondary | ICD-10-CM

## 2020-03-31 DIAGNOSIS — E1169 Type 2 diabetes mellitus with other specified complication: Secondary | ICD-10-CM

## 2020-03-31 NOTE — Chronic Care Management (AMB) (Signed)
Chronic Care Management   Follow Up Note   03/31/2020 Name: Scott [name] MRN: [id number] DOB: Dec 29, 1970  Primary Care Provider: Tamsen Roers, PA-C Reason for referral : Chronic Care Management  Scott [name] is a 49 y.o. year old male who is a primary care patient of [name], Jodell Cipro, PA-C. He is currently engaged with the chronic care management team. A routine telephonic outreach was conducted today.  Review of Scott [name]'s status, including review of consultants reports, relevant labs and test results was conducted today. Collaboration with appropriate care team members was performed as part of the comprehensive evaluation and provision of chronic care management services.    SDOH (Social Determinants of Health) assessments performed: No    Outpatient Encounter Medications as of 03/31/2020  Medication Sig Note  . albuterol (VENTOLIN HFA) 108 (90 Base) MCG/ACT inhaler Inhale 2 puffs into the lungs every 4 (four) hours as needed for wheezing or shortness of breath.   . ANDROGEL PUMP 20.25 MG/ACT (1.62%) GEL Apply 1 application topically daily.    . bisoprolol (ZEBETA) 5 MG tablet Take 0.5 tablets (2.5 mg total) by mouth every other day. 03/31/2020: Reports monitoring pulse and adjusting as needed. Taking every two to three days.  . dapagliflozin propanediol (FARXIGA) 5 MG TABS tablet Take 1 tablet (5 mg total) by mouth daily before breakfast.   . glucosamine-chondroitin 500-400 MG tablet Take 2 tablets by mouth daily.   . metolazone (ZAROXOLYN) 2.5 MG tablet Take 1 tablet (2.5 mg total) by mouth 3 (three) times a week. (Patient not taking: Reported on 02/27/2020)   . PARoxetine (PAXIL) 40 MG tablet Take 1 tablet (40 mg total) by mouth daily.   . potassium chloride SA (KLOR-CON) 20 MEQ tablet TAKE 2 TABLETS(40 MEQ) BY MOUTH TWICE DAILY   . spironolactone (ALDACTONE) 50 MG tablet Take 1 tablet (50 mg total) by mouth daily.   Marland Kitchen torsemide (DEMADEX) 20 MG tablet TAKE 3 TABLETS BY MOUTH  TWICE DAILY    No facility-administered encounter medications on file as of 03/31/2020.     Goals Addressed            This Visit's Progress   . Chronic Disease Management       CARE PLAN ENTRY (see longitudinal plan of care for additional care plan information)  Current Barriers:  . Chronic Disease Management support and education needs related to HTN, COPD, CHF, DM and HLD.  Case Manager Clinical Goal(s): Over the next 120 days, patient will: . Not require hospitalization or emergent care d/t complications r/t chronic illnesses. . Attend all scheduled medical appointments. . Take all medications as prescribed. . Monitor BP and record readings. Maintain BP at goal of < 140/90. . Weigh daily and maintain a log. . Monitor fasting blood glucose readings and maintain a log. . Follow recommended treatment plan to prevent COPD exacerbation. . Follow recommended safety measures to prevent falls and injuries. Over the next 30 days, patient will: . Follow up with PCP regarding request for a new Pulmonology referral. . Follow up with the care management team regarding possible need for Care Guide referrals.    Interventions:  . Inter-disciplinary care team collaboration (see longitudinal plan of care) . Reviewed medications. Advised to take all medications as prescribed. Advised to avoid abruptly discontinuing medications and notify provider if unable to tolerate prescribed regimen. Encouraged to notify care management team with concerns regarding medication management or prescription costs.  . Reviewed FBS readings. Reports fasting readings have  ranged from 100 to the low 120's. Denies s/sx r/t hypoglycemia or hyperglycemia.  . Reviewed weight parameters and s/sx of complications r/t CHF. Reports weight has remained stable. Reports symptoms have been well controlled. Denies increased abdominal or lower extremity edema. Denies changes or decline in activity tolerance.    . Reviewed  plan r/t COPD self-management. Reports improvements since the last outreach and states COPD has been well controlled over the past few weeks and using his rescue inhalers less frequently. He continues to use home O2 at 2 L/min. Reports now using it mostly at night. He is currently establishing care with a Pulmonologist and will complete the initial evaluation next month.   . Discussed plan for ongoing care management and follow-up. Reports doing well today. During our previous outreach he expressed concerns regarding possible need for additional assistance. Reports concerns regarding his home inspection have been resolved but agreed to update our team if this changes. Reports his condition has significantly improved since the initial outreach. Declines current need for in-home assistance. Reports his fiance is available to assist as needed. Denies urgent concerns or changes in care management needs. Agreeable to follow-up outreach next month.   Patient Self Care Activities:  . Self administers medications  . Attends scheduled provider appointments . Calls pharmacy for medication refills . Performs ADL's independently   Please see past updates related to this goal by clicking on the "Past Updates" button in the selected goal          PLAN A member of the care management team will follow-up with Scott [name] next month.    Cristy [facility] Care Management [facility] [phone]

## 2020-04-06 ENCOUNTER — Other Ambulatory Visit: Payer: Self-pay | Admitting: Cardiovascular Disease
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
## 2020-04-16 ENCOUNTER — Other Ambulatory Visit: Payer: Self-pay

## 2020-04-16 ENCOUNTER — Telehealth: Payer: Self-pay | Admitting: Cardiovascular Disease

## 2020-04-16 ENCOUNTER — Ambulatory Visit (INDEPENDENT_AMBULATORY_CARE_PROVIDER_SITE_OTHER): Payer: Medicare Other

## 2020-04-16 DIAGNOSIS — N179 Acute kidney failure, unspecified: Secondary | ICD-10-CM | POA: Diagnosis not present

## 2020-04-16 DIAGNOSIS — J441 Chronic obstructive pulmonary disease with (acute) exacerbation: Secondary | ICD-10-CM

## 2020-04-16 DIAGNOSIS — Z0000 Encounter for general adult medical examination without abnormal findings: Secondary | ICD-10-CM

## 2020-04-16 DIAGNOSIS — F32A Depression, unspecified: Secondary | ICD-10-CM

## 2020-04-16 DIAGNOSIS — E119 Type 2 diabetes mellitus without complications: Secondary | ICD-10-CM

## 2020-04-16 DIAGNOSIS — Z1211 Encounter for screening for malignant neoplasm of colon: Secondary | ICD-10-CM

## 2020-04-16 DIAGNOSIS — N182 Chronic kidney disease, stage 2 (mild): Secondary | ICD-10-CM

## 2020-04-16 NOTE — Patient Instructions (Signed)
[name] , Thank you for taking time to come for your Medicare Wellness Visit. I appreciate your ongoing commitment to your health goals. Please review the following plan we discussed and let me know if I can assist you in the future.   Screening recommendations/referrals: Colonoscopy: Cologuard ordered today.  Recommended yearly ophthalmology/optometry visit for glaucoma screening and checkup Recommended yearly dental visit for hygiene and checkup  Vaccinations: Influenza vaccine: Currently due, declined receiving. Tdap vaccine: Up to date, due 02/2028    Advanced directives: Advance directive discussed with you today. Even though you declined this today please call our office should you change your mind and we can give you the proper paperwork for you to fill out.  Conditions/risks identified: Fall risk preventatives discussed today.   Next appointment: 05/01/20 @ 1:00 PM with [name], Male Preventive care refers to lifestyle choices and visits with your health care provider that can promote health and wellness. What does preventive care include?  A yearly physical exam. This is also called an annual well check.  Dental exams once or twice a year.  Routine eye exams. Ask your health care provider how often you should have your eyes checked.  Personal lifestyle choices, including:  Daily care of your teeth and gums.  Regular physical activity.  Eating a healthy diet.  Avoiding tobacco and drug use.  Limiting alcohol use.  Practicing safe sex.  Taking low-dose aspirin every day starting at age 81. What happens during an annual well check? The services and screenings done by your health care provider during your annual well check will depend on your age, overall health, lifestyle risk factors, and family history of disease. Counseling  Your health care provider may ask you questions about your:  Alcohol use.  Tobacco use.  Drug  use.  Emotional well-being.  Home and relationship well-being.  Sexual activity.  Eating habits.  Work and work Statistician. Screening  You may have the following tests or measurements:  Height, weight, and BMI.  Blood pressure.  Lipid and cholesterol levels. These may be checked every 5 years, or more frequently if you are over 17 years old.  Skin check.  Lung cancer screening. You may have this screening every year starting at age 72 if you have a 30-pack-year history of smoking and currently smoke or have quit within the past 15 years.  Fecal occult blood test (FOBT) of the stool. You may have this test every year starting at age 69.  Flexible sigmoidoscopy or colonoscopy. You may have a sigmoidoscopy every 5 years or a colonoscopy every 10 years starting at age 38.  Prostate cancer screening. Recommendations will vary depending on your family history and other risks.  Hepatitis C blood test.  Hepatitis B blood test.  Sexually transmitted disease (STD) testing.  Diabetes screening. This is done by checking your blood sugar (glucose) after you have not eaten for a while (fasting). You may have this done every 1-3 years. Discuss your test results, treatment options, and if necessary, the need for more tests with your health care provider. Vaccines  Your health care provider may recommend certain vaccines, such as:  Influenza vaccine. This is recommended every year.  Tetanus, diphtheria, and acellular pertussis (Tdap, Td) vaccine. You may need a Td booster every 10 years.  Zoster vaccine. You may need this after age 68.  Pneumococcal 13-valent conjugate (PCV13) vaccine. You may need this if you have certain conditions and have not  been vaccinated.  Pneumococcal polysaccharide (PPSV23) vaccine. You may need one or two doses if you smoke cigarettes or if you have certain conditions. Talk to your health care provider about which screenings and vaccines you need and how  often you need them. This information is not intended to replace advice given to you by your health care provider. Make sure you discuss any questions you have with your health care provider. Document Released: 04/18/2015 Document Revised: 12/10/2015 Document Reviewed: 01/21/2015 Elsevier Interactive Patient Education  2017 [place] Prevention in the Home Falls can cause injuries. They can happen to people of all ages. There are many things you can do to make your home safe and to help prevent falls. What can I do on the outside of my home?  Regularly fix the edges of walkways and driveways and fix any cracks.  Remove anything that might make you trip as you walk through a door, such as a raised step or threshold.  Trim any bushes or trees on the path to your home.  Use bright outdoor lighting.  Clear any walking paths of anything that might make someone trip, such as rocks or tools.  Regularly check to see if handrails are loose or broken. Make sure that both sides of any steps have handrails.  Any raised decks and porches should have guardrails on the edges.  Have any leaves, snow, or ice cleared regularly.  Use sand or salt on walking paths during winter.  Clean up any spills in your garage right away. This includes oil or grease spills. What can I do in the bathroom?  Use night lights.  Install grab bars by the toilet and in the tub and shower. Do not use towel bars as grab bars.  Use non-skid mats or decals in the tub or shower.  If you need to sit down in the shower, use a plastic, non-slip stool.  Keep the floor dry. Clean up any water that spills on the floor as soon as it happens.  Remove soap buildup in the tub or shower regularly.  Attach bath mats securely with double-sided non-slip rug tape.  Do not have throw rugs and other things on the floor that can make you trip. What can I do in the bedroom?  Use night lights.  Make sure that you have a  light by your bed that is easy to reach.  Do not use any sheets or blankets that are too big for your bed. They should not hang down onto the floor.  Have a firm chair that has side arms. You can use this for support while you get dressed.  Do not have throw rugs and other things on the floor that can make you trip. What can I do in the kitchen?  Clean up any spills right away.  Avoid walking on wet floors.  Keep items that you use a lot in easy-to-reach places.  If you need to reach something above you, use a strong step stool that has a grab bar.  Keep electrical cords out of the way.  Do not use floor polish or wax that makes floors slippery. If you must use wax, use non-skid floor wax.  Do not have throw rugs and other things on the floor that can make you trip. What can I do with my stairs?  Do not leave any items on the stairs.  Make sure that there are handrails on both sides of the stairs and use them.  Fix handrails that are broken or loose. Make sure that handrails are as long as the stairways.  Check any carpeting to make sure that it is firmly attached to the stairs. Fix any carpet that is loose or worn.  Avoid having throw rugs at the top or bottom of the stairs. If you do have throw rugs, attach them to the floor with carpet tape.  Make sure that you have a light switch at the top of the stairs and the bottom of the stairs. If you do not have them, ask someone to add them for you. What else can I do to help prevent falls?  Wear shoes that:  Do not have high heels.  Have rubber bottoms.  Are comfortable and fit you well.  Are closed at the toe. Do not wear sandals.  If you use a stepladder:  Make sure that it is fully opened. Do not climb a closed stepladder.  Make sure that both sides of the stepladder are locked into place.  Ask someone to hold it for you, if possible.  Clearly mark and make sure that you can see:  Any grab bars or  handrails.  First and last steps.  Where the edge of each step is.  Use tools that help you move around (mobility aids) if they are needed. These include:  Canes.  Walkers.  Scooters.  Crutches.  Turn on the lights when you go into a dark area. Replace any light bulbs as soon as they burn out.  Set up your furniture so you have a clear path. Avoid moving your furniture around.  If any of your floors are uneven, fix them.  If there are any pets around you, be aware of where they are.  Review your medicines with your doctor. Some medicines can make you feel dizzy. This can increase your chance of falling. Ask your doctor what other things that you can do to help prevent falls. This information is not intended to replace advice given to you by your health care provider. Make sure you discuss any questions you have with your health care provider. Document Released: 01/16/2009 Document Revised: 08/28/2015 Document Reviewed: 04/26/2014 Elsevier Interactive Patient Education  2017 [employer].

## 2020-04-16 NOTE — Telephone Encounter (Signed)
  Pt c/o swelling: STAT is pt has developed SOB within 24 hours  1) How much weight have you gained and in what time span? About 4 or 5 pounds in the last week  2) If swelling, where is the swelling located? Swelling in chest and midsection   3) Are you currently taking a fluid pill? yes  4) Are you currently SOB? Yes   5) Do you have a log of your daily weights (if so, list)?   6) Have you gained 3 pounds in a day or 5 pounds in a week?   7) Have you traveled recently? no

## 2020-04-16 NOTE — Telephone Encounter (Signed)
Left detail message on VM okay by DPR, called pt back regarding his "weight gain, shob, and swelling to chest and midsection". Advised pt to take his prescribe medication Torsemide 60mg  two times a day, reduce water and salt intake. Advised that clinic is now closed, he can call back tomorrow for advice or to make an appt. or her may seek medical attention at the ED or UC if severe CP or sudden increase of shob, get's worse throughout the night. Someone in triage should be able to take his call tomorrow if pt calls back for concern, at this time office is closed. VM left.

## 2020-04-17 ENCOUNTER — Telehealth: Payer: Self-pay | Admitting: Cardiovascular Disease

## 2020-04-17 ENCOUNTER — Telehealth: Payer: Self-pay | Admitting: Family Medicine

## 2020-04-17 NOTE — Telephone Encounter (Signed)
Patient would like to discuss if Metolazone can be substituted for Spironolactone. Please call.

## 2020-04-17 NOTE — Telephone Encounter (Signed)
Spoke to pt. (See also telephone note from yesterday 1/12)  Pt had c/o 5lb wt gain x 1 week, shortness of breath, and swelling in midsection.  Per ov w/ Urban Gibson 02/27/20 pt was advised to take Torsemide 60mg  BID and only take Metolazone AS NEEDED for wt gain 3lbs in day or 5lbs in week. And pt also takes Spironolactone 50mg  daily.   1) Pt did take Metolazone yesterday and reports wt went from 210 to 205 and SOB has subsided, swelling decr.      Advised pt now continue Torsemide 60mg  BID and only take Metolazone as needed as previously instructed.      Pt states that 205 is his baseline weight and verbalized understanding.   2) Pt states that he lost his Spironolactone bottle and the pharmacy told him he would have to pay out of pocket until time for refill (approx 2 weeks) and pt unable to pay at this time. Pt normally takes daily per regimen.   Symptoms from yesterday have resolved and pt asking what to do while he is out of Spironolactone. Is it ok to stay off for 2 weeks until he can get new Rx.   Please advise regarding pt unable to take Spironolactone for approx 2 weeks.

## 2020-04-17 NOTE — Telephone Encounter (Signed)
   Telephone encounter was:  Successful.  04/17/2020 Name: Scott [name] MRN: [id number] DOB: 1970-10-04  [name] is a 50 y.o. year old male who is a primary care patient of [name], [name], PA-C . The community resource team was consulted for assistance with Financial Difficulties related to not having enough money to pay for someone to declutter and clean his home. He is also past due on his electric bill.   Care guide performed the following interventions: Patient provided with information about care guide support team and interviewed to confirm resource needs Investigation of community resources performed Discussed resources to assist with electric bill.  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 .  Follow Up Plan:  Care guide will follow up with patient by phone over the next week and Client will send a copy of his electric bill to me via email.   [place], Care Management Phone: [phone] Email: [email or web address]

## 2020-04-17 NOTE — Telephone Encounter (Signed)
Agree with recommendation for Torsemide 60mg  BID and Metolazoneo nly as needed.   Metolazone is not appropriate substitute for Spironolactone as it places him at risk for kidney injury and dehydration.   Confirmed with pharmacy that 14-day supply of Spironolactone would be $17.19. If he uses GoodRx coupon (can pull up on his phone or print at home) would be $10.55. Would either of these price points be reasonable?   If so, please send Rx to his preferred pharmacy.  If not, we may need to call his insurance company to request exception.   Loel Dubonnet, NP

## 2020-04-18 ENCOUNTER — Telehealth: Payer: Self-pay | Admitting: Family Medicine

## 2020-04-18 NOTE — Telephone Encounter (Signed)
   Telephone encounter was:  Successful.  04/18/2020 Name: Scott [name] MRN: [id number] DOB: [birth year]  Scott [name] is a 50 y.o. year old male who is a primary care patient of [name], [name], PA-C . The community resource team was consulted for assistance with Financial Difficulties related to utility bill.   Care guide performed the following interventions: Discussed resources to assist with trying to figure out how to send a copy of the utility bill through email to me. He currently cannot figure it out and is not able to walk step by step over the phone because he is out of the house. He has other appointments today and would like for me to call back on Monday to try and get the bill sent to me via email. .  Follow Up Plan:  Care guide will follow up with patient by phone over the next week to walk through trying to attach bill to email so that I can submit the form to [facility].  [place], Care Management Phone: [phone] Email: [email or web address]

## 2020-04-18 NOTE — Telephone Encounter (Signed)
Spoke to pt, notified of information below. Pt states that he now has found his bottle of Spironolactone so will not need to send in Rx.  Pt will proceed with current plan: Spironolactone 50mg  daily, Torsemide 60mg  BID, and Metolazone only as needed along with other current medications.   Pt will continue to weight daily and will contact our office of any worsening symptoms and/or if weight/SOB/swelling does not improve after taking Metolazone when he does need to take it. No further questions at this time.

## 2020-04-22 ENCOUNTER — Other Ambulatory Visit: Payer: Self-pay | Admitting: Family

## 2020-04-22 ENCOUNTER — Other Ambulatory Visit: Payer: Self-pay | Admitting: Cardiovascular Disease

## 2020-04-22 DIAGNOSIS — R000 Tachycardia, unspecified: Secondary | ICD-10-CM

## 2020-04-22 DIAGNOSIS — I5032 Chronic diastolic (congestive) heart failure: Secondary | ICD-10-CM

## 2020-04-24 ENCOUNTER — Telehealth: Payer: Self-pay | Admitting: Family Medicine

## 2020-04-24 NOTE — Telephone Encounter (Signed)
   Telephone encounter was:  Unsuccessful.  04/24/2020 Name: Scott [name] MRN: [id number] DOB: 1971/02/02  Unsuccessful outbound call made today to assist with:  Financial Difficulties related to utility for [employer] bill past due  Outreach Attempt:  1st Attempt  A HIPAA compliant voice message was left requesting a return call.  Instructed patient to call back at [phone]. Patient needs to send me a bill that has an invoice or account number for [employer] before I can submit it to [facility].   [place], Care Management Phone: [phone] Email: [email or web address]

## 2020-04-25 ENCOUNTER — Telehealth: Payer: Self-pay | Admitting: Family Medicine

## 2020-04-25 NOTE — Telephone Encounter (Signed)
   Telephone encounter was:  Unsuccessful.  04/25/2020 Name: SEANPAUL PREECE MRN: [id number] DOB: 1971/02/24  Unsuccessful outbound call made today to assist with:  Financial Difficulties related to utilities  Outreach Attempt:  2nd Attempt  A HIPAA compliant voice message was left requesting a return call.  Instructed patient to call back at [phone].  [place], Care Management Phone: [phone] Email: [email or web address]

## 2020-04-28 ENCOUNTER — Telehealth: Payer: Self-pay | Admitting: Family Medicine

## 2020-04-28 NOTE — Telephone Encounter (Signed)
   Telephone encounter was:  Unsuccessful.  04/28/2020 Name: Scott [name] MRN: [id number] DOB: [birth year]  Unsuccessful outbound call made today to assist with:  Financial Difficulties related to utility bill. I need him to give me the account number in order to get his application filled out for [facility], but will not return my call.  Outreach Attempt:  2nd Attempt  A HIPAA compliant voice message was left requesting a return call.  Instructed patient to call back at [phone].  [place], Care Management Phone: [phone] Email: [email or web address]

## 2020-04-29 ENCOUNTER — Telehealth: Payer: Self-pay | Admitting: Family Medicine

## 2020-04-29 NOTE — Telephone Encounter (Signed)
   Telephone encounter was:  Unsuccessful.  04/29/2020 Name: Scott [name] MRN: [id number] DOB: 1971/01/06  Unsuccessful outbound call made today to assist with:  Financial Difficulties related to utility bill, [employer] account number needed.   Outreach Attempt:  3rd Attempt.  Referral closed unable to contact patient.  A HIPAA compliant voice message was left requesting a return call.  Instructed patient to call back at [phone].  [place], Care Management Phone: [phone] Email: [email or web address]

## 2020-05-01 ENCOUNTER — Telehealth: Payer: Self-pay

## 2020-05-01 NOTE — Telephone Encounter (Signed)
  Chronic Care Management   Outreach Note  05/01/2020 Name: Scott [name] MRN: [id number] DOB: [birth year]  Primary Care Provider: Margo Common, PA-C Reason for referral :  Chronic Care Management  An unsuccessful telephone outreach was attempted today. [name] is currently enrolled in the chronic care management program.   Follow Up Plan:  A HIPAA compliant voice message was left today requesting a return call.    [facility] Care Management [facility] [phone]

## 2020-05-01 NOTE — Patient Instructions (Signed)
Thank you for allowing the Chronic Care Management team to participate in your care.  Goals Addressed            This Visit's Progress   . Chronic Disease Management       CARE PLAN ENTRY (see longitudinal plan of care for additional care plan information)  Current Barriers:  . Chronic Disease Management support and education needs related to HTN, COPD, CHF, DM and HLD.  Case Manager Clinical Goal(s): Over the next 120 days, patient will: . Not require hospitalization or emergent care d/t complications r/t chronic illnesses. . Attend all scheduled medical appointments. . Take all medications as prescribed. . Monitor BP and record readings. Maintain BP at goal of < 140/90. . Weigh daily and maintain a log. . Monitor fasting blood glucose readings and maintain a log. . Follow recommended treatment plan to prevent COPD exacerbation. . Follow recommended safety measures to prevent falls and injuries. Over the next 30 days, patient will: . Follow up with PCP regarding request for a new Pulmonology referral. . Follow up with the care management team regarding possible need for Care Guide referrals.    Interventions:  . Inter-disciplinary care team collaboration (see longitudinal plan of care) . Reviewed medications. Advised to take all medications as prescribed. Advised to avoid abruptly discontinuing medications and notify provider if unable to tolerate prescribed regimen. Encouraged to notify care management team with concerns regarding medication management or prescription costs.  . Reviewed FBS readings. Reports fasting readings have ranged from 100 to the low 120's. Denies s/sx r/t hypoglycemia or hyperglycemia.  . Reviewed weight parameters and s/sx of complications r/t CHF. Reports weight has remained stable. Reports symptoms have been well controlled. Denies increased abdominal or lower extremity edema. Denies changes or decline in activity tolerance.    . Reviewed plan r/t  COPD self-management. Reports improvements since the last outreach and states COPD has been well controlled over the past few weeks and using his rescue inhalers less frequently. He continues to use home O2 at 2 L/min. Reports now using it mostly at night. He is currently establishing care with a Pulmonologist and will complete the initial evaluation next month.   . Discussed plan for ongoing care management and follow-up. Reports doing well today. During our previous outreach he expressed concerns regarding possible need for additional assistance. Reports concerns regarding his home inspection have been resolved but agreed to update our team if this changes. Reports his condition has significantly improved since the initial outreach. Declines current need for in-home assistance. Reports his fiance is available to assist as needed. Denies urgent concerns or changes in care management needs. Agreeable to follow-up outreach next month.   Patient Self Care Activities:  . Self administers medications  . Attends scheduled provider appointments . Calls pharmacy for medication refills . Performs ADL's independently   Please see past updates related to this goal by clicking on the "Past Updates" button in the selected goal          Scott [name] verbalized understanding of the information discussed during the telephonic outreach today. Declined need for mailed/printed information.  A member of the care management team will follow-up with Scott [name] next month.    [facility] Care Management [facility] [phone]

## 2020-05-02 ENCOUNTER — Ambulatory Visit: Payer: Medicare Other | Admitting: Family Medicine

## 2020-05-06 ENCOUNTER — Ambulatory Visit: Payer: Medicare Other | Admitting: *Deleted

## 2020-05-12 ENCOUNTER — Ambulatory Visit: Payer: Medicare Other | Admitting: *Deleted

## 2020-05-15 ENCOUNTER — Ambulatory Visit: Payer: Medicare Other | Admitting: *Deleted

## 2020-05-15 ENCOUNTER — Ambulatory Visit: Payer: Self-pay | Admitting: Family Medicine

## 2020-05-15 ENCOUNTER — Telehealth: Payer: Self-pay

## 2020-05-15 ENCOUNTER — Ambulatory Visit: Payer: Self-pay | Admitting: *Deleted

## 2020-05-15 DIAGNOSIS — E119 Type 2 diabetes mellitus without complications: Secondary | ICD-10-CM

## 2020-05-15 MED ORDER — ONETOUCH ULTRA VI STRP: ORAL_STRIP | 0 refills | Status: DC

## 2020-05-15 MED ORDER — ONETOUCH ULTRASOFT LANCETS MISC: 0 refills | Status: DC

## 2020-05-15 NOTE — Telephone Encounter (Signed)
Copied from [place] [phone]. Topic: General - Inquiry >> May 15, 2020  4:34 PM Gillis Ends D wrote: Reason for CRM: Arbie Cookey from [facility] called and asked if the doctor could write a prescription for Lancets and Test strips for the patient. She said that the patient states that they are too high and [employer] covers them. The patient has a One Touch Ultra machine. Arbie Cookey can be reached at [facility] customer service [phone]. Please advise

## 2020-05-15 NOTE — Telephone Encounter (Signed)
C/o soreness "all over" due to a fall yesterday from ice on the ground. Reports he was walking to his car and feet went out from under him and he fell hitting back and right side and right hip. Was holding a glass with coffee and glass hit him on his left eye causing bruising and a cut. No bleeding at this time. Cut approx an inch. C/o neck soreness. Patient can walk but sore. appt scheduled for 05/16/20. Denies breathing difficulty, hitting head, or inability to walk. Care advise given. Patient verbalized understanding of care advise and to call back or go to ED if symptoms worsen.   Reason for Disposition . [1] MODERATE weakness (i.e., interferes with work, school, normal activities) AND [2] new-onset or worsening  Answer Assessment - Initial Assessment Questions 1. MECHANISM: "How did the fall happen?"     Fell  On the ice yesterday 2. DOMESTIC VIOLENCE AND ELDER ABUSE SCREENING: "Did you fall because someone pushed you or tried to hurt you?" If Yes, ask: "Are you safe now?"     na 3. ONSET: "When did the fall happen?" (e.g., minutes, hours, or days ago)     Yesterday morning walking to his car 4. LOCATION: "What part of the body hit the ground?" (e.g., back, buttocks, head, hips, knees, hands, head, stomach)     Right side back , right hip , and a glass he was carrying hit his left eye 5. INJURY: "Did you hurt (injure) yourself when you fell?" If Yes, ask: "What did you injure? Tell me more about this?" (e.g., body area; type of injury; pain severity)"     Fell on back and right side and was holding a glass of coffee and the glass hit him in his left eye causing a cut approx. Inch long . Not bleeding  6. PAIN: "Is there any pain?" If Yes, ask: "How bad is the pain?" (e.g., Scale 1-10; or mild,  moderate, severe)   - NONE (0): no pain   - MILD (1-3): doesn't interfere with normal activities    - MODERATE (4-7): interferes with normal activities or awakens from sleep    - SEVERE (8-10):  excruciating pain, unable to do any normal activities      severe 7. SIZE: For cuts, bruises, or swelling, ask: "How large is it?" (e.g., inches or centimeters)      Cut above left eye approx. An inch  8. PREGNANCY: "Is there any chance you are pregnant?" "When was your last menstrual period?"     na 9. OTHER SYMPTOMS: "Do you have any other symptoms?" (e.g., dizziness, fever, weakness; new onset or worsening).      Neck sore.  10. CAUSE: "What do you think caused the fall (or falling)?" (e.g., tripped, dizzy spell)       Feet went out from under him due to ice on ground  Protocols used: FALLS AND [facility]

## 2020-05-16 ENCOUNTER — Ambulatory Visit (INDEPENDENT_AMBULATORY_CARE_PROVIDER_SITE_OTHER): Payer: Medicare Other | Admitting: Adult Health

## 2020-05-16 ENCOUNTER — Other Ambulatory Visit: Payer: Self-pay

## 2020-05-16 ENCOUNTER — Encounter: Payer: Self-pay | Admitting: Adult Health

## 2020-05-16 ENCOUNTER — Ambulatory Visit
Admission: RE | Admit: 2020-05-16 | Discharge: 2020-05-16 | Disposition: A | Discharge status: Home / Self Care | Payer: Medicare Other | Source: Ambulatory Visit | Attending: Adult Health | Admitting: Adult Health

## 2020-05-16 VITALS — BP 113/73 | HR 78 | Temp 98.5°F | Resp 18 | Wt 213.4 lb

## 2020-05-16 DIAGNOSIS — M545 Low back pain, unspecified: Secondary | ICD-10-CM | POA: Insufficient documentation

## 2020-05-16 DIAGNOSIS — W009XXA Unspecified fall due to ice and snow, initial encounter: Secondary | ICD-10-CM | POA: Insufficient documentation

## 2020-05-16 DIAGNOSIS — M25521 Pain in right elbow: Secondary | ICD-10-CM | POA: Insufficient documentation

## 2020-05-16 DIAGNOSIS — M79641 Pain in right hand: Secondary | ICD-10-CM | POA: Insufficient documentation

## 2020-05-16 DIAGNOSIS — M542 Cervicalgia: Secondary | ICD-10-CM | POA: Insufficient documentation

## 2020-05-16 DIAGNOSIS — R0781 Pleurodynia: Secondary | ICD-10-CM | POA: Insufficient documentation

## 2020-05-16 DIAGNOSIS — B369 Superficial mycosis, unspecified: Secondary | ICD-10-CM | POA: Diagnosis not present

## 2020-05-16 DIAGNOSIS — Z981 Arthrodesis status: Secondary | ICD-10-CM | POA: Insufficient documentation

## 2020-05-16 DIAGNOSIS — M25551 Pain in right hip: Secondary | ICD-10-CM | POA: Insufficient documentation

## 2020-05-16 MED ORDER — NYSTATIN 100000 UNIT/GM EX OINT: 1.0000 "application " | TOPICAL_OINTMENT | Freq: Two times a day (BID) | CUTANEOUS | 0 refills | Status: DC

## 2020-05-16 NOTE — Progress Notes (Signed)
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  If he would like to see orthopedics for follow up on neck we can refer.

## 2020-05-16 NOTE — Progress Notes (Signed)
Stable chest x ray cardiac size mildly enlarged.

## 2020-05-16 NOTE — Progress Notes (Signed)
Right elbow normal.

## 2020-05-16 NOTE — Progress Notes (Signed)
Low back no acute fracture.

## 2020-05-16 NOTE — Telephone Encounter (Signed)
Noted  

## 2020-05-16 NOTE — Progress Notes (Signed)
No rib fracture seen.

## 2020-05-16 NOTE — Progress Notes (Signed)
Mild radial bone congenital changes, no fracture or dislocation. Recommend orthopedics referral.

## 2020-05-16 NOTE — Progress Notes (Signed)
Right hip x ray within normal.

## 2020-05-16 NOTE — Progress Notes (Signed)
Established patient visit   Patient: Scott [name]   DOB: 10-Oct-1970   50 y.o. Male  MRN: [id number] Visit Date: 05/16/2020  Today's healthcare provider: Marcille Buffy, FNP   Chief Complaint  Patient presents with  . Fall   Subjective    Fall The accident occurred 2 days ago. The fall occurred while walking (patient reports that he was walking on ice). He fell from a height of 1 to 2 ft. The point of impact was the right hip. The pain is present in the back, right upper arm, right knee, right hip and face (patient states that when he fell he was holding a glass and states that it broke and cutt him above his eye, he reports that he has had discharge from cut and also complains of headache). The symptoms are aggravated by movement. Associated symptoms include abdominal pain (lower). Pertinent negatives include no bowel incontinence, fever, headaches, hearing loss, hematuria, loss of consciousness, nausea, numbness, tingling, visual change or vomiting. Associated symptoms comments: Increased shortness of breath. He has tried nothing for the symptoms.   Occurred on 05/14/2020. He landed on his right arm and hip.   He has a cut above his left eye.  He has right arm pain without any decrease in motion.       Medications: Outpatient Medications Prior to Visit  Medication Sig  . albuterol (VENTOLIN HFA) 108 (90 Base) MCG/ACT inhaler Inhale 2 puffs into the lungs every 4 (four) hours as needed for wheezing or shortness of breath.  . ANDROGEL PUMP 20.25 MG/ACT (1.62%) GEL Apply 1 application topically daily.   . bisoprolol (ZEBETA) 5 MG tablet Take 0.5 tablets (2.5 mg total) by mouth every other day.  . budesonide-formoterol (SYMBICORT) 80-4.5 MCG/ACT inhaler SMARTSIG:2 Puff(s) By Mouth Twice Daily  . dapagliflozin propanediol (FARXIGA) 5 MG TABS tablet Take 1 tablet (5 mg total) by mouth daily before breakfast.  . glucosamine-chondroitin 500-400 MG tablet Take 2 tablets by mouth  daily.  Marland Kitchen glucose blood (ONETOUCH ULTRA) test strip Use with testing once daily and as needed  . Lancets (ONETOUCH ULTRASOFT) lancets Use with testing once daily and as neededUse as instructed  . PARoxetine (PAXIL) 40 MG tablet Take 1 tablet (40 mg total) by mouth daily.  . potassium chloride SA (KLOR-CON) 20 MEQ tablet TAKE 2 TABLETS(40 MEQ) BY MOUTH TWICE DAILY  . spironolactone (ALDACTONE) 50 MG tablet Take 1 tablet (50 mg total) by mouth daily.  Marland Kitchen torsemide (DEMADEX) 20 MG tablet TAKE 3 TABLETS BY MOUTH TWICE DAILY  . [DISCONTINUED] metolazone (ZAROXOLYN) 2.5 MG tablet Take 1 tablet (2.5 mg total) by mouth 3 (three) times a week.   No facility-administered medications prior to visit.    Review of Systems  Constitutional: Negative for fever.  Cardiovascular: Negative.   Gastrointestinal: Positive for abdominal pain (lower). Negative for bowel incontinence, nausea and vomiting.  Genitourinary: Negative for hematuria.  Neurological: Negative for tingling, loss of consciousness, numbness and headaches.       Objective    BP 113/73   Pulse 78   Temp 98.5 F (36.9 C) (Oral)   Resp 18   Wt 213 lb 6.4 oz (96.8 kg)   SpO2 92%   BMI 39.03 kg/m     Physical Exam Nursing note reviewed.  Constitutional:      General: He is not in acute distress.    Appearance: He is not ill-appearing, toxic-appearing or diaphoretic.  HENT:  Head: Normocephalic and atraumatic.     Right Ear: Tympanic membrane, ear canal and external ear normal. There is no impacted cerumen.     Left Ear: Tympanic membrane, ear canal and external ear normal. There is no impacted cerumen.     Nose: Nose normal. No congestion or rhinorrhea.     Mouth/Throat:     Mouth: Mucous membranes are moist.     Pharynx: No oropharyngeal exudate or posterior oropharyngeal erythema.  Eyes:     General: No scleral icterus.       Right eye: No discharge.        Left eye: No discharge.     Extraocular Movements: Extraocular  movements intact.     Pupils: Pupils are equal, round, and reactive to light.  Cardiovascular:     Rate and Rhythm: Normal rate and regular rhythm.     Pulses: Normal pulses.     Heart sounds: Normal heart sounds.  Pulmonary:     Effort: Pulmonary effort is normal.     Breath sounds: Normal breath sounds.  Abdominal:     General: There is no distension.     Palpations: Abdomen is soft. There is no mass.     Tenderness: There is no abdominal tenderness. There is no right CVA tenderness, left CVA tenderness, guarding or rebound.     Hernia: No hernia is present.  Musculoskeletal:        General: Tenderness, deformity (chronic ) and signs of injury present.     Cervical back: Normal range of motion and neck supple.     Right lower leg: No edema.     Left lower leg: No edema.  Skin:    General: Skin is warm.     Capillary Refill: Capillary refill takes less than 2 seconds.     Findings: No rash.  Neurological:     General: No focal deficit present.  Psychiatric:        Mood and Affect: Mood normal.        Thought Content: Thought content normal.        Judgment: Judgment normal.      No results found for any visits on 05/16/20.  Assessment & Plan     Fall due to slipping on ice or snow, initial encounter - Plan: DG Chest 2 View, DG Cervical Spine Complete, DG Lumbar Spine 2-3 Views, DG Hip Unilat W OR W/O Pelvis 2-3 Views Right, DG Ribs Unilateral Right, DG Elbow Complete Right, DG Hand Complete Right, CANCELED: DG Hand 2 View Right  Fungal rash of right side face.  - Plan: nystatin ointment (MYCOSTATIN)    Meds ordered this encounter  Medications  . nystatin ointment (MYCOSTATIN)    Sig: Apply 1 application topically 2 (two) times daily.    Dispense:  30 g    Refill:  0    Red Flags discussed. The patient was given clear instructions to go to ER or return to medical center if any red flags develop, symptoms do not improve, worsen or new problems develop. They verbalized  understanding.  Return in about 1 week (around 05/23/2020), or if symptoms worsen or fail to improve, for at any time for any worsening symptoms, Go to Emergency room/ urgent care if worse.      The entirety of the information documented in the History of Present Illness, Review of Systems and Physical Exam were personally obtained by me. Portions of this information were initially documented by the CMA and reviewed  by me for thoroughness and accuracy.     Marcille Buffy, [place] [phone] (phone) [phone] (fax)  [place]

## 2020-05-16 NOTE — Patient Instructions (Signed)
Back Injury Prevention Back injuries can be very painful. They can also be difficult to heal. After having one back injury, you are more likely to have another one again. It is important to learn how to avoid injuring or re-injuring your back. The following tips can help you to prevent a back injury. What actions can I take to prevent back injuries? Changes in your diet Talk with your doctor about what to eat. Some foods can make the bones strong.  Talk with your doctor about how much calcium and vitamin D you need each day. These nutrients help to prevent weakening of the bones (osteoporosis).  Eat foods that have calcium. These include: ? Dairy products. ? Green leafy vegetables. ? Food and drinks that have had calcium added to them (fortified).  Eat foods that have vitamin D. These include: ? Milk. ? Food and drinks that have had vitamin D added to them.  Take other supplements and vitamins only as told by your doctor. Physical fitness Physical fitness makes your bones and muscles strong. It also improves your balance and strength.  Exercise for 30 minutes per day on most days of the week, or as told by your doctor. Make sure to: ? Do aerobic exercises, such as walking, jogging, biking, or swimming. ? Do exercises that increase balance and strength, such as tai chi and yoga. ? Do stretching exercises. This helps with flexibility. ? Develop strong belly (abdominal) muscles. Your belly muscles help to support your back.  Stay at a healthy weight. This lowers your risk of a back injury. Good posture Prevent back injuries by developing and maintaining a good posture. To do  this:  Sit up straight and stand up straight. Avoid leaning forward when you sit or hunching over when you stand.  Choose chairs that have good low-back (lumbar) support.  If you work at a desk: ? Sit close to it so you do not need to lean over. ? Keep your chin tucked in. ? Keep your neck drawn back. ? Keep your elbows bent so that your arms make a corner (right angle).  When you drive: ? Sit high and close to the steering wheel. Add a low-back support to your car seat, if needed. ? Take breaks every hour if you are driving for long periods of time.  Avoid sitting or standing in one position for very long. Take breaks to get up, stretch, and walk around at least once every hour.  Sleep on your side with your knees slightly bent, or sleep on your back with a pillow under your knees.  Lifting, twisting, and reaching  Heavy lifting ? Avoid heavy lifting, especially lifting over and over again. If you must do heavy lifting:  Stretch before lifting.  Work slowly.  Rest between lifts.  Use a tool such as a cart or a dolly to move objects if one is available.  Make several small trips instead of carrying one heavy load.  Ask for help when you need it, especially when moving big objects. ? Follow these steps when lifting:  Stand with your feet shoulder-width apart.  Get as close to the object as you can. Do not pick up a heavy object that is far from your body.  Use handles or lifting straps if they are available.  Bend at your knees. Squat down, but keep your heels off the floor.  Keep your shoulders back. Keep your chin tucked in. Keep your back straight.  Lift the object slowly while you tighten the muscles in your legs, belly, and bottom. Keep the object as close to the center of your body as possible. ? Follow these steps when putting down a heavy load:  Stand with your feet shoulder-width apart.  Lower the object slowly while you tighten the muscles in  your legs, belly, and bottom. Keep the object as close to the center of your body as possible.  Keep your shoulders back. Keep your chin tucked in. Keep your back straight.  Bend at your knees. Squat down, but keep your heels off the floor.  Use handles or lifting straps if they are available.  Twisting and reaching ? Avoid lifting heavy objects above your waist. ? Do not twist at your waist while you are lifting or carrying a load. If you need to turn, move your feet. ? Do not bend over without bending at your knees. ? Avoid reaching over your head, across a table, or for an object on a high surface.   Other things to do  Avoid wet floors and icy ground. Keep sidewalks clear of ice to prevent falls.  Do not sleep on a mattress that is too soft or too hard.  Store heavier objects on shelves at waist level.  Store lighter objects on lower or higher shelves.  Find ways to lower your stress, such as: ? Exercise. ? Massage. ? Relaxation techniques.  Talk with your doctor if you feel anxious or depressed. These conditions can make back pain worse.  Wear flat heel shoes with cushioned soles.  Use both shoulder straps when carrying a backpack.  Do not use any products that contain nicotine or tobacco, such as cigarettes and e-cigarettes. If you need help quitting, ask your doctor.   Summary  Back injuries can be very painful and difficult to heal.  You can keep your back healthy by making certain changes. These include eating foods that make bones strong, working on being physically fit, developing a good posture, and lifting heavy objects in a safe way. This information is not intended to replace advice given to you by your health care provider. Make sure you discuss any questions you have with your health care provider. Document Revised: 12/13/2018 Document Reviewed: 05/13/2017 Elsevier Patient Education  [place].  Contusion A contusion is a deep bruise. This is a  result of an injury that causes bleeding under the skin. Symptoms of bruising include pain, swelling, and discolored skin. The skin may turn blue, purple, or yellow. Follow these instructions at home: Managing pain, stiffness, and swelling You may use RICE. This  stands for:  Resting.  Icing.  Compression, or putting pressure.  Elevating, or raising the injured area. To follow this method, do these actions:  Rest the injured area.  If told, put ice on the injured area. ? Put ice in a plastic bag. ? Place a towel between your skin and the bag. ? Leave the ice on for 20 minutes, 2-3 times per day.  If told, put light pressure (compression) on the injured area using an elastic bandage. Make sure the bandage is not too tight. If the area tingles or becomes numb, remove it and put it back on as told by your doctor.  If possible, raise (elevate) the injured area above the level of your heart while you are sitting or lying down.   General instructions  Take over-the-counter and prescription medicines only as told by your doctor.  Keep all follow-up visits as told by your doctor. This is important. Contact a doctor if:  Your symptoms do not get better after several days of treatment.  Your symptoms get worse.  You have trouble moving the injured area. Get help right away if:  You have very bad pain.  You have a loss of feeling (numbness) in a hand or foot.  Your hand or foot turns pale or cold. Summary  A contusion is a deep bruise. This is a result of an injury that causes bleeding under the skin.  Symptoms of bruising include pain, swelling, and discolored skin. The skin may turn blue, purple, or yellow.  This condition is treated with rest, ice, compression, and elevation. This is also called RICE. You may be given over-the-counter medicines for pain.  Contact a doctor if you do not feel better, or you feel worse. Get help right away if you have very bad pain, have lost  feeling in a hand or foot, or the area turns pale or cold. This information is not intended to replace advice given to you by your health care provider. Make sure you discuss any questions you have with your health care provider. Document Revised: 11/11/2017 Document Reviewed: 11/11/2017 Elsevier Patient Education  [place].

## 2020-05-19 DIAGNOSIS — W009XXA Unspecified fall due to ice and snow, initial encounter: Secondary | ICD-10-CM | POA: Insufficient documentation

## 2020-05-19 DIAGNOSIS — B369 Superficial mycosis, unspecified: Secondary | ICD-10-CM | POA: Insufficient documentation

## 2020-05-19 HISTORY — DX: Unspecified fall due to ice and snow, initial encounter: W00.9XXA

## 2020-05-23 ENCOUNTER — Ambulatory Visit (INDEPENDENT_AMBULATORY_CARE_PROVIDER_SITE_OTHER): Payer: Self-pay | Admitting: Adult Health

## 2020-05-23 DIAGNOSIS — Z5329 Procedure and treatment not carried out because of patient's decision for other reasons: Secondary | ICD-10-CM

## 2020-05-23 NOTE — Progress Notes (Signed)
       No show for follow up.

## 2020-05-29 ENCOUNTER — Other Ambulatory Visit: Payer: Self-pay | Admitting: Family Medicine

## 2020-05-29 DIAGNOSIS — E1169 Type 2 diabetes mellitus with other specified complication: Secondary | ICD-10-CM

## 2020-05-29 DIAGNOSIS — I5032 Chronic diastolic (congestive) heart failure: Secondary | ICD-10-CM

## 2020-05-29 NOTE — Telephone Encounter (Signed)
Requested medication (s) are due for refill today: yes  Requested medication (s) are on the active medication list: yes  Last refill: 03/04/2020  Future visit scheduled: yes  Notes to clinic:  Patient no showed appt on 05/24/2019   Requested Prescriptions  Pending Prescriptions Disp Refills   FARXIGA 5 MG TABS tablet [Pharmacy Med Name: FARXIGA 5MG TABLETS] 90 tablet 0    Sig: TAKE 1 TABLET(5 MG) BY MOUTH DAILY BEFORE BREAKFAST      Endocrinology:  Diabetes - SGLT2 Inhibitors Failed - 05/29/2020 10:17 AM      Failed - Cr in normal range and within 360 days    Creatinine, Ser  Date Value Ref Range Status  02/08/2020 1.31 (H) 0.76 - 1.27 mg/dL Final          Failed - LDL in normal range and within 360 days    LDL Cholesterol  Date Value Ref Range Status  02/07/2018 85 0 - 99 mg/dL Final    Comment:           Total Cholesterol/HDL:CHD Risk Coronary Heart Disease Risk Table                     Men   Women  1/2 Average Risk   3.4   3.3  Average Risk       5.0   4.4  2 X Average Risk   9.6   7.1  3 X Average Risk  23.4   11.0        Use the calculated Patient Ratio above and the CHD Risk Table to determine the patient's CHD Risk.        ATP III CLASSIFICATION (LDL):  <100     mg/dL   Optimal  100-129  mg/dL   Near or Above                    Optimal  130-159  mg/dL   Borderline  160-189  mg/dL   High  >190     mg/dL   Very High Performed at [facility], [address] [place], [place] [address]           Failed - HBA1C is between 0 and 7.9 and within 180 days    Hgb A1c MFr Bld  Date Value Ref Range Status  01/24/2020 8.6 (H) 4.8 - 5.6 % Final    Comment:             Prediabetes: 5.7 - 6.4          Diabetes: >6.4          Glycemic control for adults with diabetes: <7.0           Passed - eGFR in normal range and within 360 days    GFR calc Af Amer  Date Value Ref Range Status  02/08/2020 73 >59 mL/min/1.73 Final    Comment:    **In  accordance with recommendations from the NKF-ASN Task force,**   [facility] is in the process of updating its eGFR calculation to the   2021 CKD-EPI creatinine equation that estimates kidney function   without a race variable.    GFR calc non Af Amer  Date Value Ref Range Status  02/08/2020 63 >59 mL/min/1.73 Final          Passed - Valid encounter within last 6 months    Recent Outpatient Visits           6 days  ago No-show for appointment   [place], FNP   1 week ago Fall due to slipping on ice or snow, initial encounter   [facility] [place], Kelby Aline, FNP   2 months ago Type 2 diabetes mellitus with other specified complication, without long-term current use of insulin [facility])   [place], [name], PA-C   3 months ago History of bradycardia   [place], FNP   3 months ago Chronic diastolic heart failure [facility])   [place], [name], PA-C       Future Appointments             In 1 month Gollan, Kathlene November, MD [place], [facility]   In 1 month [place], [name], PA-C [employer], [place]

## 2020-06-12 ENCOUNTER — Telehealth: Payer: Self-pay

## 2020-06-12 ENCOUNTER — Other Ambulatory Visit: Payer: Self-pay | Admitting: Physician Assistant

## 2020-06-12 ENCOUNTER — Ambulatory Visit: Payer: Medicare Other | Admitting: Podiatry

## 2020-06-12 NOTE — Telephone Encounter (Signed)
  Chronic Care Management   Outreach Note  06/12/2020 Name: Scott [name] MRN: [id number] DOB: 07-05-1970  Primary Care Provider: Margo Common, PA-C Reason for referral : Chronic Care Management    An unsuccessful telephone outreach was attempted today. [name] is currently enrolled in the Chronic Care Management program.     Follow Up Plan:  A HIPAA compliant voice message was left today requesting a return call.    Cristy [facility] Care [facility] [phone]

## 2020-06-21 ENCOUNTER — Other Ambulatory Visit: Payer: Self-pay | Admitting: Cardiovascular Disease

## 2020-06-21 DIAGNOSIS — I5032 Chronic diastolic (congestive) heart failure: Secondary | ICD-10-CM

## 2020-06-21 DIAGNOSIS — R000 Tachycardia, unspecified: Secondary | ICD-10-CM

## 2020-06-23 ENCOUNTER — Telehealth: Payer: Self-pay

## 2020-06-23 ENCOUNTER — Other Ambulatory Visit: Payer: Self-pay

## 2020-06-23 MED ORDER — ONETOUCH VERIO VI STRP: ORAL_STRIP | 0 refills | Status: DC

## 2020-06-23 MED ORDER — ONETOUCH DELICA PLUS LANCET30G MISC: 1.0000 | Freq: Every day | 0 refills | Status: AC

## 2020-06-23 NOTE — Telephone Encounter (Signed)
Copied from [place] [phone]. Topic: General - Other >> Jun 23, 2020  2:05 PM Tessa Lerner A wrote: Reason for CRM: Patient would like to be contacted regarding the test strips for their blood sugar monitor  Patient picked up test strips from the pharmacy last week and shares that the strips do not work with their meter  Patient believes they need to be prescribed  OneTouch Verio Flex strips and One Touch Delica lancets as well  Please contact to advise further if needed

## 2020-06-24 ENCOUNTER — Other Ambulatory Visit: Payer: Self-pay | Admitting: Family Medicine

## 2020-06-24 MED ORDER — ONETOUCH VERIO VI STRP: ORAL_STRIP | 0 refills | Status: DC

## 2020-06-25 ENCOUNTER — Ambulatory Visit: Payer: Self-pay | Admitting: *Deleted

## 2020-06-25 NOTE — Telephone Encounter (Signed)
Would agree he needs follow up with his endocrinologist (Dr. Ronnald Collum) to assess adrenal function with the mass findings.

## 2020-06-25 NOTE — Telephone Encounter (Signed)
Contacted by Dr. Aviva Signs, [facility] pulmonologist. Patient had CT of chest and abdominal imaging completed and results show adrenal masses and multiple results  and MD would like to fax results to patient's PCP. Patient's PCP currently out of the office and MD will fax results to Joette Catching, PA. MD reports patient would like to f/u with PA and is requesting a call back for further evaluation after PA has viewed results of imaging. Fax [phone] given. For further questions can contact Dr. Arta Silence on her personal cell # [phone].

## 2020-06-27 NOTE — Telephone Encounter (Signed)
Please call patient and have him follow up with Dr. Ronnald Collum per Simona Huh suggestion.

## 2020-06-29 NOTE — Progress Notes (Signed)
Cardiology Office Note  Date:  06/30/2020   ID:  Scott [name], DOB 01-Feb-1971, MRN [id number]  PCP:  Scott Common, PA-C   Chief Complaint  Patient presents with  . other    4 month follow up - Patient c.o if he is not using his o2 he gets lightheaded. Meds reviewed verbally with patient.     HPI:  Scott [name] is a 50 y.o. male with history of  congenital abnormality (Klippel-File syndrome)with restrictive lung disease and airway hyperactivity,  diastolic CHF,  Former smoker, quit 2008 hypertension,  hyperlipidemia,  asthma,  depression, anxiety Who presents for routine follow-up of his chronic diastolic CHF, lung disease  LOV with myself 05/2019 Seen by one of our providers November 2021  Previous reported high-volume soda, Gatorade  Admitted 12/19/2019-01/05/2020 for COPD exacerbation.   Bilateral ultrasounds were negative for DVT.   D-dimer was normal.    Echocardiogram 02/20/2020 with normal LVEF, normal wall motion, normal LV diastolic dysfunction, RV normal size and pressure, no significant valvular normality.  Right atrial pressure mildly elevated at 8 mmHg.  Has been taking torsemide 60 twice daily, metolazone as needed Spironolactone  Presents today with caretaker Reports weight is stable, denies any leg swelling,  Stable shortness of breath on oxygen He does eat out periodically [employer], caught a  GI bug, having loose bowel movements  EKG personally reviewed by myself on todays visit Shows normal sinus rhythm rate 76 bpm no significant ST-T wave changes  Other past medical history reviewed Hospitalization November 2019, acute on chronic diastolic CHF with hypoxia requiring IV Lasix  Echo on 02/05/2018 showed an LV EF: 55% - 60%.  Stress test on 02/09/2018 showed no evidence of ischemia..  Sleep study on 03/10/2018, was negative for OSA. Pulmonary function testing showed severe restrictive lung disease.   PMH:   has a past medical history of Acid  reflux, Anxiety, Arthritis, Asthma, CHF (congestive heart failure) ([place]), Depression, Glaucoma, Hyperlipidemia, Hypertension, and Klippel-Feil syndrome.  PSH:    Past Surgical History:  Procedure Laterality Date  . COLOSTOMY    . [place] SHUNT PLACEMENT      Current Outpatient Medications  Medication Sig Dispense Refill  . albuterol (VENTOLIN HFA) 108 (90 Base) MCG/ACT inhaler Inhale 2 puffs into the lungs every 4 (four) hours as needed for wheezing or shortness of breath. 1 each 1  . ANDROGEL PUMP 20.25 MG/ACT (1.62%) GEL Apply 1 application topically daily.     . bisoprolol (ZEBETA) 5 MG tablet Take 2.5 mg by mouth daily.    . budesonide-formoterol (SYMBICORT) 80-4.5 MCG/ACT inhaler SMARTSIG:2 Puff(s) By Mouth Twice Daily    . FARXIGA 5 MG TABS tablet TAKE 1 TABLET(5 MG) BY MOUTH DAILY BEFORE BREAKFAST 90 tablet 0  . glucosamine-chondroitin 500-400 MG tablet Take 2 tablets by mouth daily.    Marland Kitchen glucose blood (ONETOUCH VERIO) test strip Use as instructed to check blood sugar daily for type 2 diabetes 100 each 0  . Lancets (ONETOUCH DELICA PLUS [id number]) MISC 1 each by Does not apply route daily. 100 each 0  . Lancets (ONETOUCH ULTRASOFT) lancets Use with testing once daily and as neededUse as instructed 100 each 0  . nystatin ointment (MYCOSTATIN) Apply 1 application topically 2 (two) times daily. 30 g 0  . PARoxetine (PAXIL) 40 MG tablet Take 1 tablet (40 mg total) by mouth daily. 30 tablet 11  . potassium chloride SA (KLOR-CON) 20 MEQ tablet TAKE 2 TABLETS(40 MEQ) BY MOUTH TWICE  DAILY 360 tablet 0  . spironolactone (ALDACTONE) 50 MG tablet Take 1 tablet (50 mg total) by mouth daily. 90 tablet 1  . torsemide (DEMADEX) 20 MG tablet TAKE 3 TABLETS BY MOUTH TWICE DAILY 540 tablet 0   No current facility-administered medications for this visit.     Allergies:   Codeine   Social History:  The patient  reports that he quit smoking about 14 years ago. His smoking use included cigarettes. He  has a 10.00 pack-year smoking history. He has never used smokeless tobacco. He reports that he does not drink alcohol and does not use drugs.   Family History:   family history includes Cancer in his paternal grandfather; Colon cancer in his father; Heart disease in his father; Lung cancer in his maternal grandmother; Multiple sclerosis in his mother; Ovarian cancer in his paternal grandmother; Thyroid cancer in his maternal grandfather.    Review of Systems: Review of Systems  Constitutional: Negative.        Abdominal swelling  HENT: Negative.   Respiratory: Positive for shortness of breath.   Cardiovascular: Negative.   Gastrointestinal: Negative.   Musculoskeletal: Negative.   Neurological: Negative.   Psychiatric/Behavioral: Negative.   All other systems reviewed and are negative.    PHYSICAL EXAM: VS:  BP 110/80 (BP Location: Right Arm, Patient Position: Sitting, Cuff Size: Normal)   Pulse 76   Ht 5\' 2"  (1.575 m)   Wt 209 lb (94.8 kg)   SpO2 98%   BMI 38.23 kg/m  , BMI Body mass index is 38.23 kg/m. Constitutional:  oriented to person, place, and time. No distress.  Presenting in a wheelchair, obese, on oxygen HENT:  Head: Grossly normal Eyes:  no discharge. No scleral icterus.  Neck: No JVD, no carotid bruits  Cardiovascular: Regular rate and rhythm, no murmurs appreciated Pulmonary/Chest: Clear to auscultation bilaterally, no wheezes or rails Abdominal: Soft.  no distension.  no tenderness.  Musculoskeletal: Normal range of motion Neurological:  normal muscle tone. Coordination normal. No atrophy Skin: Skin warm and dry Psychiatric: normal affect, pleasant  Recent Labs: 01/18/2020: ALT 47 01/24/2020: BNP 20.7 02/08/2020: BUN 20; Creatinine, Ser 1.31; Hemoglobin 13.9; Platelets 296; Potassium 4.3; Sodium 139    Lipid Panel Lab Results  Component Value Date   CHOL 155 02/07/2018   HDL 55 02/07/2018   LDLCALC 85 02/07/2018   TRIG 74 02/07/2018      Wt  Readings from Last 3 Encounters:  06/30/20 209 lb (94.8 kg)  05/16/20 213 lb 6.4 oz (96.8 kg)  03/07/20 213 lb (96.6 kg)     ASSESSMENT AND PLAN:  Problem List Items Addressed This Visit      Cardiology Problems   Chronic diastolic heart failure (HCC) - Primary (Chronic)   Relevant Medications   bisoprolol (ZEBETA) 5 MG tablet   Other Relevant Orders   EKG 12-Lead   Comprehensive metabolic panel   HLD (hyperlipidemia)   Relevant Medications   bisoprolol (ZEBETA) 5 MG tablet   Other Relevant Orders   Lipid panel    Other Visit Diagnoses    Restrictive lung disease       Tachycardia       Relevant Orders   EKG 12-Lead   Comprehensive metabolic panel   Dyspnea on exertion       Relevant Orders   Comprehensive metabolic panel   Chronic obstructive pulmonary disease, unspecified COPD type ([place])       Relevant Orders   EKG 12-Lead  Acute on chronic respiratory distress Secondary to restrictive lung disease, Obesity, chronic diastolic CHF Lab work pending, continue torsemide 60 twice daily He has cut back on his Gatorade and soda which is helping  Chronic diastolic CHF Repeat BMP to help guide diuresis Weight stable, clinically appears euvolemic, On torsemide 60 twice daily Baseline creatinine appears 1.3  Obesity We have encouraged careful diet management in an effort to lose weight. Still eating out regularly Recommended low carbohydrate diet with the aid presents with him    Total encounter time more than 25 minutes  Greater than 50% was spent in counseling and coordination of care with the patient    Signed, Esmond Plants, M.D., Ph.D. [place], [place]

## 2020-06-30 ENCOUNTER — Encounter: Payer: Self-pay | Admitting: Cardiovascular Disease

## 2020-06-30 ENCOUNTER — Ambulatory Visit (INDEPENDENT_AMBULATORY_CARE_PROVIDER_SITE_OTHER): Payer: Medicare Other | Admitting: Cardiovascular Disease

## 2020-06-30 ENCOUNTER — Other Ambulatory Visit: Payer: Self-pay

## 2020-06-30 VITALS — BP 110/80 | HR 76 | Ht 62.0 in | Wt 209.0 lb

## 2020-06-30 DIAGNOSIS — E782 Mixed hyperlipidemia: Secondary | ICD-10-CM

## 2020-06-30 DIAGNOSIS — R000 Tachycardia, unspecified: Secondary | ICD-10-CM | POA: Diagnosis not present

## 2020-06-30 DIAGNOSIS — J449 Chronic obstructive pulmonary disease, unspecified: Secondary | ICD-10-CM | POA: Diagnosis not present

## 2020-06-30 DIAGNOSIS — J984 Other disorders of lung: Secondary | ICD-10-CM

## 2020-06-30 DIAGNOSIS — R0600 Dyspnea, unspecified: Secondary | ICD-10-CM

## 2020-06-30 DIAGNOSIS — I5032 Chronic diastolic (congestive) heart failure: Secondary | ICD-10-CM

## 2020-06-30 DIAGNOSIS — R0609 Other forms of dyspnea: Secondary | ICD-10-CM

## 2020-06-30 NOTE — Patient Instructions (Addendum)
Medication Instructions:  No changes  Lab work: CMP, lipids (in one week or so)  Do not eat 12 hrs before your lipid panel Walk into medical mall at the check in desk, they will direct you to lab registration, hours for labs are Monday-Friday 07:00am-5:30pm (no appointment necessary)   Testing/Procedures: No new testing needed  Follow-Up:  . You will need a follow up appointment in 6 months, APP ok  . Providers on your designated Care Team:   . Murray Hodgkins, NP . Christell Faith, PA-C . Marrianne Mood, PA-C

## 2020-07-01 ENCOUNTER — Other Ambulatory Visit
Admission: RE | Admit: 2020-07-01 | Discharge: 2020-07-01 | Disposition: A | Discharge status: Home / Self Care | Payer: Medicare Other | Source: Ambulatory Visit | Attending: Cardiovascular Disease | Admitting: Cardiovascular Disease

## 2020-07-01 DIAGNOSIS — R000 Tachycardia, unspecified: Secondary | ICD-10-CM | POA: Diagnosis present

## 2020-07-01 DIAGNOSIS — I5032 Chronic diastolic (congestive) heart failure: Secondary | ICD-10-CM

## 2020-07-01 DIAGNOSIS — R0609 Other forms of dyspnea: Secondary | ICD-10-CM

## 2020-07-01 DIAGNOSIS — E782 Mixed hyperlipidemia: Secondary | ICD-10-CM | POA: Diagnosis present

## 2020-07-01 DIAGNOSIS — R0600 Dyspnea, unspecified: Secondary | ICD-10-CM | POA: Diagnosis present

## 2020-07-01 LAB — COMPREHENSIVE METABOLIC PANEL
ALT: 21 U/L (ref 0–44)
AST: 20 U/L (ref 15–41)
Albumin: 3.6 g/dL (ref 3.5–5.0)
Alkaline Phosphatase: 100 U/L (ref 38–126)
Anion gap: 9 (ref 5–15)
BUN: 27 mg/dL — ABNORMAL HIGH (ref 6–20)
CO2: 35 mmol/L — ABNORMAL HIGH (ref 22–32)
Calcium: 9 mg/dL (ref 8.9–10.3)
Chloride: 92 mmol/L — ABNORMAL LOW (ref 98–111)
Creatinine, Ser: 1.16 mg/dL (ref 0.61–1.24)
GFR, Estimated: >60 mL/min (ref >60)
Glucose, Bld: 145 mg/dL — ABNORMAL HIGH (ref 70–99)
Potassium: 4.2 mmol/L (ref 3.5–5.1)
Sodium: 136 mmol/L (ref 135–145)
Total Bilirubin: 0.6 mg/dL (ref 0.3–1.2)
Total Protein: 7.6 g/dL (ref 6.5–8.1)

## 2020-07-01 LAB — LIPID PANEL
Cholesterol: 233 mg/dL — ABNORMAL HIGH (ref 0–200)
HDL: 71 mg/dL (ref >40)
LDL Cholesterol: 133 mg/dL — ABNORMAL HIGH (ref 0–99)
Total CHOL/HDL Ratio: 3.3 RATIO
Triglycerides: 143 mg/dL (ref <150)
VLDL: 29 mg/dL (ref 0–40)

## 2020-07-01 NOTE — Telephone Encounter (Signed)
LMTCB, PEC Triage Nurse may give patient results  

## 2020-07-02 NOTE — Telephone Encounter (Signed)
Per initial encounter, recommendations given ,"Would agree he needs follow up with his endocrinologist (Dr. Ronnald Collum) to assess adrenal function with the mass findings"; pt notified; he states he has been trying to reach Dr Christy Sartorius but it goes to voicemail; contacted off and pt transferred for scheduling.Marland Kitchen

## 2020-07-04 ENCOUNTER — Other Ambulatory Visit: Payer: Self-pay | Admitting: Cardiovascular Disease

## 2020-07-04 NOTE — Telephone Encounter (Signed)
Rx request sent to pharmacy.  

## 2020-07-07 ENCOUNTER — Telehealth: Payer: Self-pay | Admitting: *Deleted

## 2020-07-07 NOTE — Telephone Encounter (Signed)
-----   Message from Minna Merritts, MD sent at 07/06/2020  4:06 PM EDT ----- Significant jump in cholesterol compared to 2 years ago Would consider starting crestor 10 mg daily Given diabetes, LDL needs to be <100

## 2020-07-07 NOTE — Telephone Encounter (Signed)
Left voicemail message to call back for review of results and recommendations.  

## 2020-07-08 ENCOUNTER — Ambulatory Visit: Payer: Self-pay | Admitting: Family Medicine

## 2020-07-08 ENCOUNTER — Other Ambulatory Visit: Payer: Self-pay | Admitting: Family Medicine

## 2020-07-08 DIAGNOSIS — F418 Other specified anxiety disorders: Secondary | ICD-10-CM

## 2020-07-11 ENCOUNTER — Telehealth: Payer: Self-pay

## 2020-07-16 NOTE — Telephone Encounter (Signed)
Able to reach pt regarding his recent lab work, Dr. Rockey Situ had a chance to review his results and advised   " Significant jump in cholesterol compared to 2 years ago  Would consider starting crestor 10 mg daily  Given diabetes, LDL needs to be <100"   Mr. Loomer stated he has restarted taking his OTC Fish Oil, wanted to try and see if that will help reduce his cholesterol levels as it seemed to help in the past. Has an appt with Dr. Natale Milch on 5/5 and will have him recheck his lipid levels, if no improvement, then will consider Crestor as Dr. Rockey Situ suggested. Otherwise, all questions and concerns were address and no additional concerns at this time.

## 2020-07-23 ENCOUNTER — Telehealth: Payer: Self-pay | Admitting: Family Medicine

## 2020-07-23 NOTE — Telephone Encounter (Signed)
Pt received the wrong lancet and testing strips for one touch verio flex and his insurance will not pay for the supplies. Pt would like a new meter, lancets and testing strip. [facility]  [address] in [place] phone number [phone]-

## 2020-07-24 MED ORDER — BLOOD GLUCOSE METER KIT: PACK | 0 refills | Status: AC

## 2020-07-31 ENCOUNTER — Other Ambulatory Visit: Payer: Self-pay

## 2020-07-31 ENCOUNTER — Encounter: Payer: Self-pay | Admitting: Podiatry

## 2020-07-31 ENCOUNTER — Ambulatory Visit (INDEPENDENT_AMBULATORY_CARE_PROVIDER_SITE_OTHER): Payer: Medicare Other | Admitting: Podiatry

## 2020-07-31 DIAGNOSIS — E119 Type 2 diabetes mellitus without complications: Secondary | ICD-10-CM | POA: Diagnosis not present

## 2020-07-31 DIAGNOSIS — M79674 Pain in right toe(s): Secondary | ICD-10-CM | POA: Diagnosis not present

## 2020-07-31 DIAGNOSIS — B351 Tinea unguium: Secondary | ICD-10-CM

## 2020-07-31 DIAGNOSIS — N182 Chronic kidney disease, stage 2 (mild): Secondary | ICD-10-CM

## 2020-07-31 DIAGNOSIS — M79675 Pain in left toe(s): Secondary | ICD-10-CM | POA: Diagnosis not present

## 2020-07-31 DIAGNOSIS — N179 Acute kidney failure, unspecified: Secondary | ICD-10-CM

## 2020-07-31 NOTE — Progress Notes (Signed)
This patient returns to my office for at risk foot care.  This patient requires this care by a professional since this patient will be at risk due to having kidney disease and diabetes type 2.  This patient is unable to cut nails himself since the patient cannot reach his nails.These nails are painful walking and wearing shoes.  This patient presents for at risk foot care today.  General Appearance  Alert, conversant and in no acute stress.  Vascular  Dorsalis pedis and posterior tibial  pulses are palpable  bilaterally.  Capillary return is within normal limits  bilaterally. Temperature is within normal limits  bilaterally.  Neurologic  Senn-Weinstein monofilament wire test within normal limits  bilaterally. Muscle power within normal limits bilaterally.  Nails Thick disfigured discolored nails with subungual debris hallux  Bilateral.. No evidence of bacterial infection or drainage bilaterally.  Orthopedic  No limitations of motion  feet .  No crepitus or effusions noted.  No bony pathology or digital deformities noted.  Skin  normotropic skin with no porokeratosis noted bilaterally.  No signs of infections or ulcers noted.     Onychomycosis  Pain in right toes  Pain in left toes  Consent was obtained for treatment procedures.   Mechanical debridement of nails 1-5  bilaterally performed with a nail nipper.  Filed with dremel without incident.    Return office visit   3 months                   Told patient to return for periodic foot care and evaluation due to potential at risk complications.   Ladanian Kelter DPM  

## 2020-08-04 ENCOUNTER — Telehealth: Payer: Self-pay

## 2020-08-04 NOTE — Telephone Encounter (Signed)
Copied from [place] [phone]. Topic: General - Other >> Aug 04, 2020  1:57 PM Leward Quan A wrote: Reason for CRM: Patient wanted to inform Simona Huh [name] that he has been having pain in his legs started last week but have progressed since it started in the lower legs and calf. Asking what he can do or what may be causing this Ph# [phone]

## 2020-08-05 ENCOUNTER — Ambulatory Visit: Payer: Self-pay | Admitting: Family Medicine

## 2020-08-11 NOTE — Telephone Encounter (Signed)
Tried to call patient, but, no answer. Left message to call back if we are needed.

## 2020-08-12 ENCOUNTER — Ambulatory Visit (INDEPENDENT_AMBULATORY_CARE_PROVIDER_SITE_OTHER): Payer: Medicare Other | Admitting: Family Medicine

## 2020-08-12 ENCOUNTER — Other Ambulatory Visit: Payer: Self-pay

## 2020-08-12 VITALS — BP 137/89 | HR 94 | Temp 98.6°F | Wt 213.0 lb

## 2020-08-12 DIAGNOSIS — E782 Mixed hyperlipidemia: Secondary | ICD-10-CM

## 2020-08-12 DIAGNOSIS — J9611 Chronic respiratory failure with hypoxia: Secondary | ICD-10-CM

## 2020-08-12 DIAGNOSIS — I10 Essential (primary) hypertension: Secondary | ICD-10-CM

## 2020-08-12 DIAGNOSIS — E119 Type 2 diabetes mellitus without complications: Secondary | ICD-10-CM | POA: Diagnosis not present

## 2020-08-12 NOTE — Progress Notes (Signed)
Established patient visit   Patient: Scott [name]   DOB: Sep 15, 1970   50 y.o. Male  MRN: [id number] Visit Date: 08/12/2020  Today's healthcare provider: [name], PA-C   No chief complaint on file.  Subjective    HPI  Diabetes Mellitus Type II, Follow-up  Lab Results  Component Value Date   HGBA1C 8.6 (H) 01/24/2020   HGBA1C 5.7 (H) 02/07/2018   Wt Readings from Last 3 Encounters:  08/12/20 213 lb (96.6 kg)  06/30/20 209 lb (94.8 kg)  05/16/20 213 lb 6.4 oz (96.8 kg)   Last seen for diabetes 8 months ago, however patient did not get his A1C as instructed. Management since then includes none. He reports good compliance with treatment. He is not having side effects.  Symptoms: Yes fatigue No foot ulcerations  No appetite changes No nausea  No paresthesia of the feet  No polydipsia  No polyuria Yes visual disturbances   No vomiting     Home blood sugar records:  100-250  Episodes of hypoglycemia? No    Current insulin regiment:   Most Recent Eye Exam: unknown  Pertinent Labs: Lab Results  Component Value Date   CHOL 233 (H) 07/01/2020   HDL 71 07/01/2020   LDLCALC 133 (H) 07/01/2020   TRIG 143 07/01/2020   CHOLHDL 3.3 07/01/2020   Lab Results  Component Value Date   NA 136 07/01/2020   K 4.2 07/01/2020   CREATININE 1.16 07/01/2020   GFRNONAA >60 07/01/2020   GFRAA 73 02/08/2020   GLUCOSE 145 (H) 07/01/2020     --------------------------------------------------------------------------------------------------- Past Medical History:  Diagnosis Date   Acid reflux    Anxiety    Arthritis    Asthma    CHF (congestive heart failure) (HCC)    Depression    Glaucoma    Hyperlipidemia    Hypertension    Klippel-Feil syndrome    Past Surgical History:  Procedure Laterality Date   COLOSTOMY     DEN[place] SHUNT PLACEMENT     Family History  Problem Relation Age of Onset   Multiple sclerosis Mother    Colon cancer Father    Heart disease  Father    Lung cancer Maternal Grandmother    Thyroid cancer Maternal Grandfather    Ovarian cancer Paternal Grandmother    Cancer Paternal Grandfather    Social History   Tobacco Use   Smoking status: Former    Packs/day: 1.00    Years: 10.00    Pack years: 10.00    Types: Cigarettes    Quit date: 05/2006    Years since quitting: 14.6   Smokeless tobacco: Never   Tobacco comments:    quit 05/2006  Vaping Use   Vaping Use: Former   Devices: tried once  Substance Use Topics   Alcohol use: No    Alcohol/week: 0.0 standard drinks   Drug use: No   Allergies  Allergen Reactions   Codeine Shortness Of Breath   Medications: Outpatient Medications Prior to Visit  Medication Sig   albuterol (VENTOLIN HFA) 108 (90 Base) MCG/ACT inhaler Inhale 2 puffs into the lungs every 4 (four) hours as needed for wheezing or shortness of breath.   ANDROGEL PUMP 20.25 MG/ACT (1.62%) GEL Apply 1 application topically daily.    bisoprolol (ZEBETA) 5 MG tablet Take 2.5 mg by mouth daily.   blood glucose meter kit and supplies Use 1-2 times a day as needed.Dispense based on patient and insurance preference. (FOR  ICD-10 E10.9, E11.9).   budesonide-formoterol (SYMBICORT) 80-4.5 MCG/ACT inhaler SMARTSIG:2 Puff(s) By Mouth Twice Daily   FARXIGA 5 MG TABS tablet TAKE 1 TABLET(5 MG) BY MOUTH DAILY BEFORE BREAKFAST   glucosamine-chondroitin 500-400 MG tablet Take 2 tablets by mouth daily.   glucose blood (ONETOUCH VERIO) test strip Use as instructed to check blood sugar daily for type 2 diabetes   Lancets (ONETOUCH DELICA PLUS [id number]) MISC 1 each by Does not apply route daily.   Lancets (ONETOUCH ULTRASOFT) lancets Use with testing once daily and as neededUse as instructed   nystatin ointment (MYCOSTATIN) Apply 1 application topically 2 (two) times daily.   PARoxetine (PAXIL) 40 MG tablet TAKE 1 TABLET(40 MG) BY MOUTH DAILY   potassium chloride SA (KLOR-CON) 20 MEQ tablet TAKE 2 TABLETS(40 MEQ) BY MOUTH  TWICE DAILY   spironolactone (ALDACTONE) 50 MG tablet Take 1 tablet (50 mg total) by mouth daily.   torsemide (DEMADEX) 20 MG tablet TAKE 3 TABLETS BY MOUTH TWICE DAILY   No facility-administered medications prior to visit.    Review of Systems  Constitutional: Negative.   HENT: Negative.    Eyes:  Positive for visual disturbance.  Respiratory:  Positive for shortness of breath.   Cardiovascular:  Negative for chest pain.  Genitourinary: Negative.   All other systems reviewed and are negative.      Objective    BP 137/89 (BP Location: Right Arm, Patient Position: Sitting, Cuff Size: Normal)   Pulse 94   Temp 98.6 F (37 C) (Oral)   Wt 213 lb (96.6 kg)   SpO2 99%   BMI 38.96 kg/m      Physical Exam Constitutional:      General: He is not in acute distress.    Appearance: He is well-developed.  HENT:     Head: Normocephalic and atraumatic.     Right Ear: Hearing normal.     Left Ear: Hearing normal.     Nose: Nose normal.  Eyes:     General: Lids are normal. No scleral icterus.       Right eye: No discharge.        Left eye: No discharge.     Conjunctiva/sclera: Conjunctivae normal.  Cardiovascular:     Rate and Rhythm: Normal rate and regular rhythm.     Heart sounds: Normal heart sounds.  Pulmonary:     Effort: Pulmonary effort is normal. No respiratory distress.     Breath sounds: Normal breath sounds.  Abdominal:     General: Bowel sounds are normal.     Palpations: Abdomen is soft.  Musculoskeletal:     Comments: Spinal and hand deformities - congenital.  Skin:    Findings: No lesion or rash.  Neurological:     Mental Status: He is alert and oriented to person, place, and time.  Psychiatric:        Speech: Speech normal.        Behavior: Behavior normal.        Thought Content: Thought content normal.      No results found for any visits on 08/12/20.  Assessment & Plan     1. Essential hypertension Good BP control with use of Bisoprolol,  Torsemide and Spironolactone. Followed by cardiologist (Dr. Rockey Situ) regularly. Recheck labs and schedule follow up pending reports. - CBC with Differential/Platelet - Comprehensive metabolic panel - Lipid Panel With LDL/HDL Ratio - TSH  2. Mixed hyperlipidemia Due for follow up labs. Encouraged to get back on the low  fat diet. - Comprehensive metabolic panel - Lipid Panel With LDL/HDL Ratio - TSH  3. Diabetes mellitus without complication (HCC) Hgb H4L was 8.6 on 01-24-20. Still taking Farxiga 5 mg qd. Will recheck labs. Recommend he get annual eye exam. - CBC with Differential/Platelet - Comprehensive metabolic panel - Hemoglobin A1c - Lipid Panel With LDL/HDL Ratio  4. Chronic respiratory failure with hypoxia (HCC) Continues oxygen at 2-3 LPM by nasal cannula. Still on albuterol inhaler and Symbicort 80-4.5 mcg  2 puffs BID. Check CBC and CMP. - CBC with Differential/Platelet - Comprehensive metabolic panel   No follow-ups on file.      I, Jonluke Cobbins, PA-C, have reviewed all documentation for this visit. The documentation on 12/29/20 for the exam, diagnosis, procedures, and orders are all accurate and complete.    [name], PA-C  [employer] [phone] (phone) [phone] (fax)  [place]

## 2020-08-16 LAB — COMPREHENSIVE METABOLIC PANEL
ALT: 20 IU/L (ref 0–44)
AST: 15 IU/L (ref 0–40)
Albumin/Globulin Ratio: 1.4 (ref 1.2–2.2)
Albumin: 4 g/dL (ref 4.0–5.0)
Alkaline Phosphatase: 116 IU/L (ref 44–121)
BUN/Creatinine Ratio: 15 (ref 9–20)
BUN: 16 mg/dL (ref 6–24)
Bilirubin Total: 0.3 mg/dL (ref 0.0–1.2)
CO2: 33 mmol/L — ABNORMAL HIGH (ref 20–29)
Calcium: 9.4 mg/dL (ref 8.7–10.2)
Chloride: 90 mmol/L — ABNORMAL LOW (ref 96–106)
Creatinine, Ser: 1.09 mg/dL (ref 0.76–1.27)
Globulin, Total: 2.8 g/dL (ref 1.5–4.5)
Glucose: 141 mg/dL — ABNORMAL HIGH (ref 65–99)
Potassium: 4.6 mmol/L (ref 3.5–5.2)
Sodium: 137 mmol/L (ref 134–144)
Total Protein: 6.8 g/dL (ref 6.0–8.5)
eGFR: 83 mL/min/{1.73_m2} (ref >59)

## 2020-08-16 LAB — HEMOGLOBIN A1C
Est. average glucose Bld gHb Est-mCnc: 192 mg/dL
Hgb A1c MFr Bld: 8.3 % — ABNORMAL HIGH (ref 4.8–5.6)

## 2020-08-16 LAB — CBC WITH DIFFERENTIAL/PLATELET
Basophils Absolute: 0 10*3/uL (ref 0.0–0.2)
Basos: 0 %
EOS (ABSOLUTE): 0.1 10*3/uL (ref 0.0–0.4)
Eos: 1 %
Hematocrit: 41.7 % (ref 37.5–51.0)
Hemoglobin: 13.5 g/dL (ref 13.0–17.7)
Immature Grans (Abs): 0 10*3/uL (ref 0.0–0.1)
Immature Granulocytes: 0 %
Lymphocytes Absolute: 1.9 10*3/uL (ref 0.7–3.1)
Lymphs: 20 %
MCH: 29.3 pg (ref 26.6–33.0)
MCHC: 32.4 g/dL (ref 31.5–35.7)
MCV: 91 fL (ref 79–97)
Monocytes Absolute: 1.2 10*3/uL — ABNORMAL HIGH (ref 0.1–0.9)
Monocytes: 12 %
Neutrophils Absolute: 6.6 10*3/uL (ref 1.4–7.0)
Neutrophils: 67 %
Platelets: 331 10*3/uL (ref 150–450)
RBC: 4.61 x10E6/uL (ref 4.14–5.80)
RDW: 13.2 % (ref 11.6–15.4)
WBC: 9.8 10*3/uL (ref 3.4–10.8)

## 2020-08-16 LAB — LIPID PANEL WITH LDL/HDL RATIO
Cholesterol, Total: 211 mg/dL — ABNORMAL HIGH (ref 100–199)
HDL: 75 mg/dL (ref >39)
LDL Chol Calc (NIH): 116 mg/dL — ABNORMAL HIGH (ref 0–99)
LDL/HDL Ratio: 1.5 ratio (ref 0.0–3.6)
Triglycerides: 115 mg/dL (ref 0–149)
VLDL Cholesterol Cal: 20 mg/dL (ref 5–40)

## 2020-08-16 LAB — TSH: TSH: 1.4 u[IU]/mL (ref 0.450–4.500)

## 2020-08-26 LAB — HM HEPATITIS C SCREENING LAB: HM Hepatitis Screen: NEGATIVE

## 2020-08-27 ENCOUNTER — Other Ambulatory Visit: Payer: Self-pay | Admitting: Family Medicine

## 2020-08-27 DIAGNOSIS — I5032 Chronic diastolic (congestive) heart failure: Secondary | ICD-10-CM

## 2020-08-27 DIAGNOSIS — E1169 Type 2 diabetes mellitus with other specified complication: Secondary | ICD-10-CM

## 2020-09-11 ENCOUNTER — Encounter: Payer: Self-pay | Admitting: Family Medicine

## 2020-10-23 ENCOUNTER — Encounter: Payer: Self-pay | Admitting: Podiatry

## 2020-10-23 ENCOUNTER — Ambulatory Visit (INDEPENDENT_AMBULATORY_CARE_PROVIDER_SITE_OTHER): Payer: Medicare Other | Admitting: Podiatry

## 2020-10-23 ENCOUNTER — Other Ambulatory Visit: Payer: Self-pay

## 2020-10-23 DIAGNOSIS — M2580 Other specified joint disorders, unspecified joint: Secondary | ICD-10-CM

## 2020-10-23 DIAGNOSIS — M7742 Metatarsalgia, left foot: Secondary | ICD-10-CM

## 2020-10-23 NOTE — Progress Notes (Signed)
Subjective:  Patient ID: Scott [name], male    DOB: 24-Dec-1970,  MRN: [id number]  Chief Complaint  Patient presents with   Foot Pain    Pain in the ball of foot     50 y.o. male presents with the above complaint.  Patient presents with complaint of left submetatarsal 1 pain.  Patient states it hurts pretty much across the whole ball of the foot but the son know when is the most painful.  Hurts with ambulation came out of nowhere he does not recall the original cause of the injury.  He is does not know of any trauma to the area.  He denies any other acute complaints.  He states this started when he was sleeping.  Pain scale is 8 out of 10 pain on palpation pain with ambulation sharp shooting in nature   Review of Systems: Negative except as noted in the HPI. Denies N/V/F/Ch.  Past Medical History:  Diagnosis Date   Acid reflux    Anxiety    Arthritis    Asthma    CHF (congestive heart failure) (HCC)    Depression    Glaucoma    Hyperlipidemia    Hypertension    Klippel-Feil syndrome     Current Outpatient Medications:    albuterol (VENTOLIN HFA) 108 (90 Base) MCG/ACT inhaler, Inhale 2 puffs into the lungs every 4 (four) hours as needed for wheezing or shortness of breath., Disp: 1 each, Rfl: 1   ANDROGEL PUMP 20.25 MG/ACT (1.62%) GEL, Apply 1 application topically daily. , Disp: , Rfl:    bisoprolol (ZEBETA) 5 MG tablet, Take 2.5 mg by mouth daily., Disp: , Rfl:    blood glucose meter kit and supplies, Use 1-2 times a day as needed.Dispense based on patient and insurance preference. (FOR ICD-10 E10.9, E11.9)., Disp: 1 each, Rfl: 0   budesonide-formoterol (SYMBICORT) 80-4.5 MCG/ACT inhaler, SMARTSIG:2 Puff(s) By Mouth Twice Daily, Disp: , Rfl:    FARXIGA 5 MG TABS tablet, TAKE 1 TABLET(5 MG) BY MOUTH DAILY BEFORE BREAKFAST, Disp: 90 tablet, Rfl: 0   glucosamine-chondroitin 500-400 MG tablet, Take 2 tablets by mouth daily., Disp: , Rfl:    glucose blood (ONETOUCH VERIO) test strip,  Use as instructed to check blood sugar daily for type 2 diabetes, Disp: 100 each, Rfl: 0   Lancets (ONETOUCH DELICA PLUS [id number]) MISC, 1 each by Does not apply route daily., Disp: 100 each, Rfl: 0   Lancets (ONETOUCH ULTRASOFT) lancets, Use with testing once daily and as neededUse as instructed, Disp: 100 each, Rfl: 0   nystatin ointment (MYCOSTATIN), Apply 1 application topically 2 (two) times daily., Disp: 30 g, Rfl: 0   PARoxetine (PAXIL) 40 MG tablet, TAKE 1 TABLET(40 MG) BY MOUTH DAILY, Disp: 30 tablet, Rfl: 0   potassium chloride SA (KLOR-CON) 20 MEQ tablet, TAKE 2 TABLETS(40 MEQ) BY MOUTH TWICE DAILY, Disp: 360 tablet, Rfl: 0   spironolactone (ALDACTONE) 50 MG tablet, Take 1 tablet (50 mg total) by mouth daily., Disp: 90 tablet, Rfl: 1   torsemide (DEMADEX) 20 MG tablet, TAKE 3 TABLETS BY MOUTH TWICE DAILY, Disp: 540 tablet, Rfl: 0  Social History   Tobacco Use  Smoking Status Former   Packs/day: 1.00   Years: 10.00   Pack years: 10.00   Types: Cigarettes   Quit date: 05/2006   Years since quitting: 14.4  Smokeless Tobacco Never  Tobacco Comments   quit 05/2006    Allergies  Allergen Reactions   Codeine Shortness Of  Breath   Objective:  There were no vitals filed for this visit. There is no height or weight on file to calculate BMI. Constitutional Well developed. Well nourished.  Vascular Dorsalis pedis pulses palpable bilaterally. Posterior tibial pulses palpable bilaterally. Capillary refill normal to all digits.  No cyanosis or clubbing noted. Pedal hair growth normal.  Neurologic Normal speech. Oriented to person, place, and time. Epicritic sensation to light touch grossly present bilaterally.  Dermatologic Nails well groomed and normal in appearance. No open wounds. No skin lesions.  Orthopedic: Pain on palpation to the sesamoidal complex left submetatarsal 1.  No pain with range of motion of the MPJ.  No deep intra-articular pain noted the left first  metatarsophalangeal joint.  No extensor or flexor tendinitis noted   Radiographs: None Assessment:   1. Metatarsalgia of left foot   2. Sesamoiditis    Plan:  Patient was evaluated and treated and all questions answered.  Left sesamoiditis leading to metatarsalgia -I explained patient etiology of sesamoiditis and various treatment options were discussed.  Patient is compensating away from submetatarsal 1 and therefore leading to excessive stress to the rest of the metatarsophalangeal joint leading to metatarsalgia.  I discussed with the patient that he will benefit from steroid injection of decrease acute inflammatory component associated pain.  Patient agrees with plan like to proceed with a steroid injection. -A steroid injection was performed at left sesamoid complex using 1% plain Lidocaine and 10 mg of Kenalog. This was well tolerated.   No follow-ups on file.

## 2020-11-13 ENCOUNTER — Other Ambulatory Visit: Payer: Self-pay | Admitting: Family

## 2020-11-14 NOTE — Telephone Encounter (Signed)
Rx(s) sent to pharmacy electronically.  

## 2020-11-26 ENCOUNTER — Other Ambulatory Visit: Payer: Self-pay | Admitting: Family Medicine

## 2020-11-26 DIAGNOSIS — F418 Other specified anxiety disorders: Secondary | ICD-10-CM

## 2020-11-26 MED ORDER — PAROXETINE HCL 40 MG PO TABS: ORAL_TABLET | ORAL | 0 refills | Status: DC

## 2020-11-26 NOTE — Telephone Encounter (Signed)
Medication Refill - Medication: Paxil   Has the patient contacted their pharmacy? No. Pt states that he is no longer seeing the provider who originally prescribed this medication due to them not accepting his insurance. Please advise.  (Agent: If no, request that the patient contact the pharmacy for the refill.) (Agent: If yes, when and what did the pharmacy advise?)  Preferred Pharmacy (with phone number or street name):  [facility] [place], [place]  [place] [address]  Phone: [phone] Fax: [phone]  Hours: Not open 24 hours    Agent: Please be advised that RX refills may take up to 3 business days. We ask that you follow-up with your pharmacy.

## 2020-11-27 ENCOUNTER — Other Ambulatory Visit: Payer: Self-pay | Admitting: Family Medicine

## 2020-11-29 IMAGING — DX DG ABD PORTABLE 1V
3 series · 3 of 3 positions shown · non-contrast
Comparison: November 30, 2019.

CLINICAL DATA: Abdominal distension.

EXAM:
PORTABLE ABDOMEN - 1 VIEW

[abdomen supine (1 of 3)]
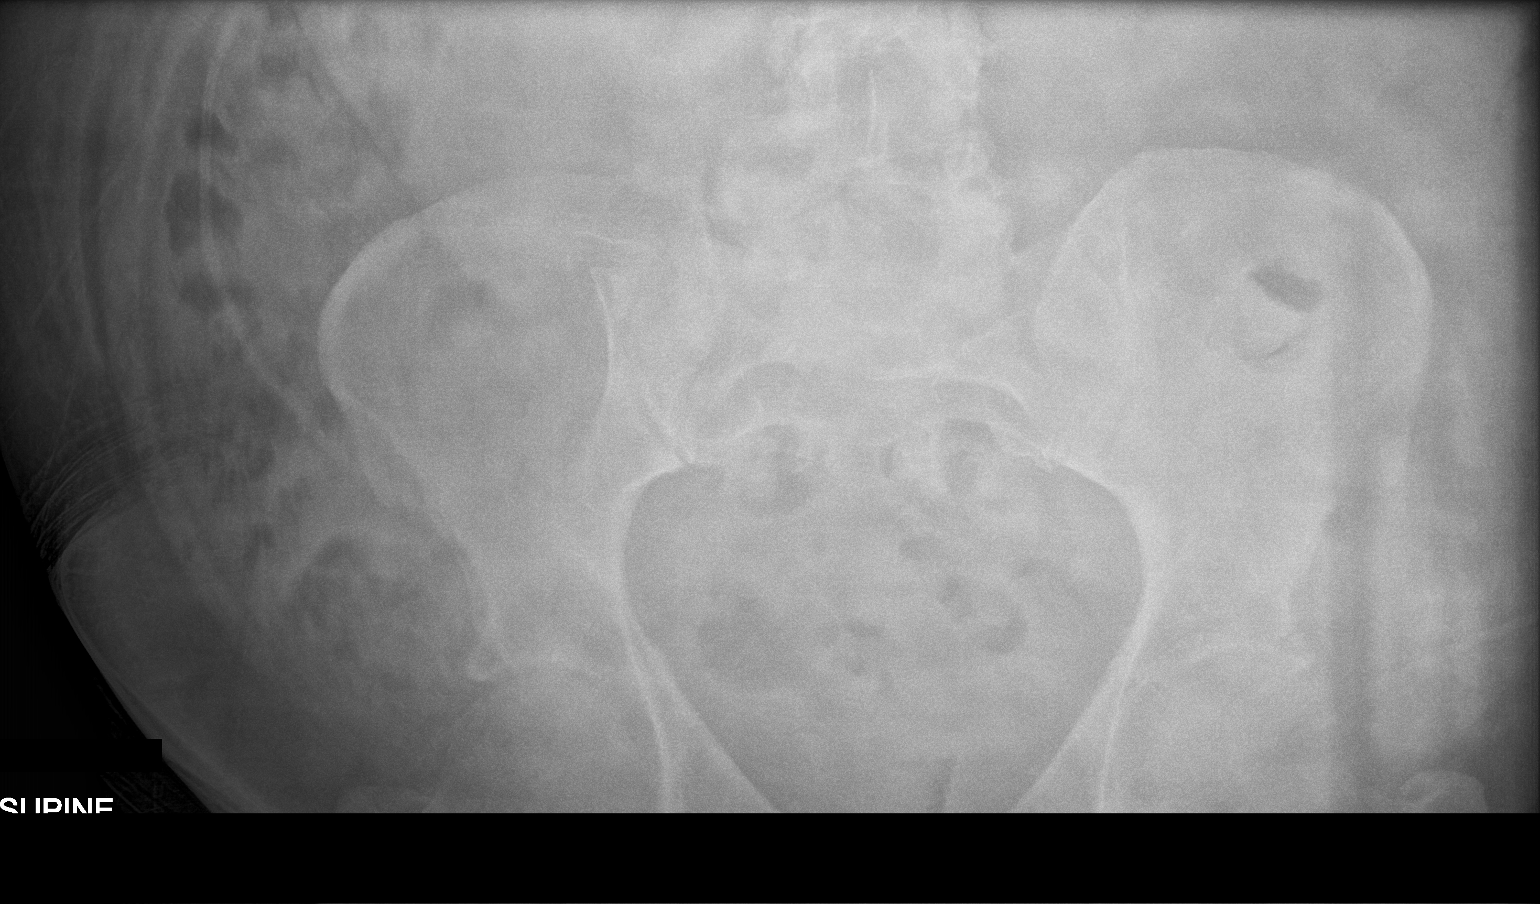

[abdomen supine (2 of 3)]
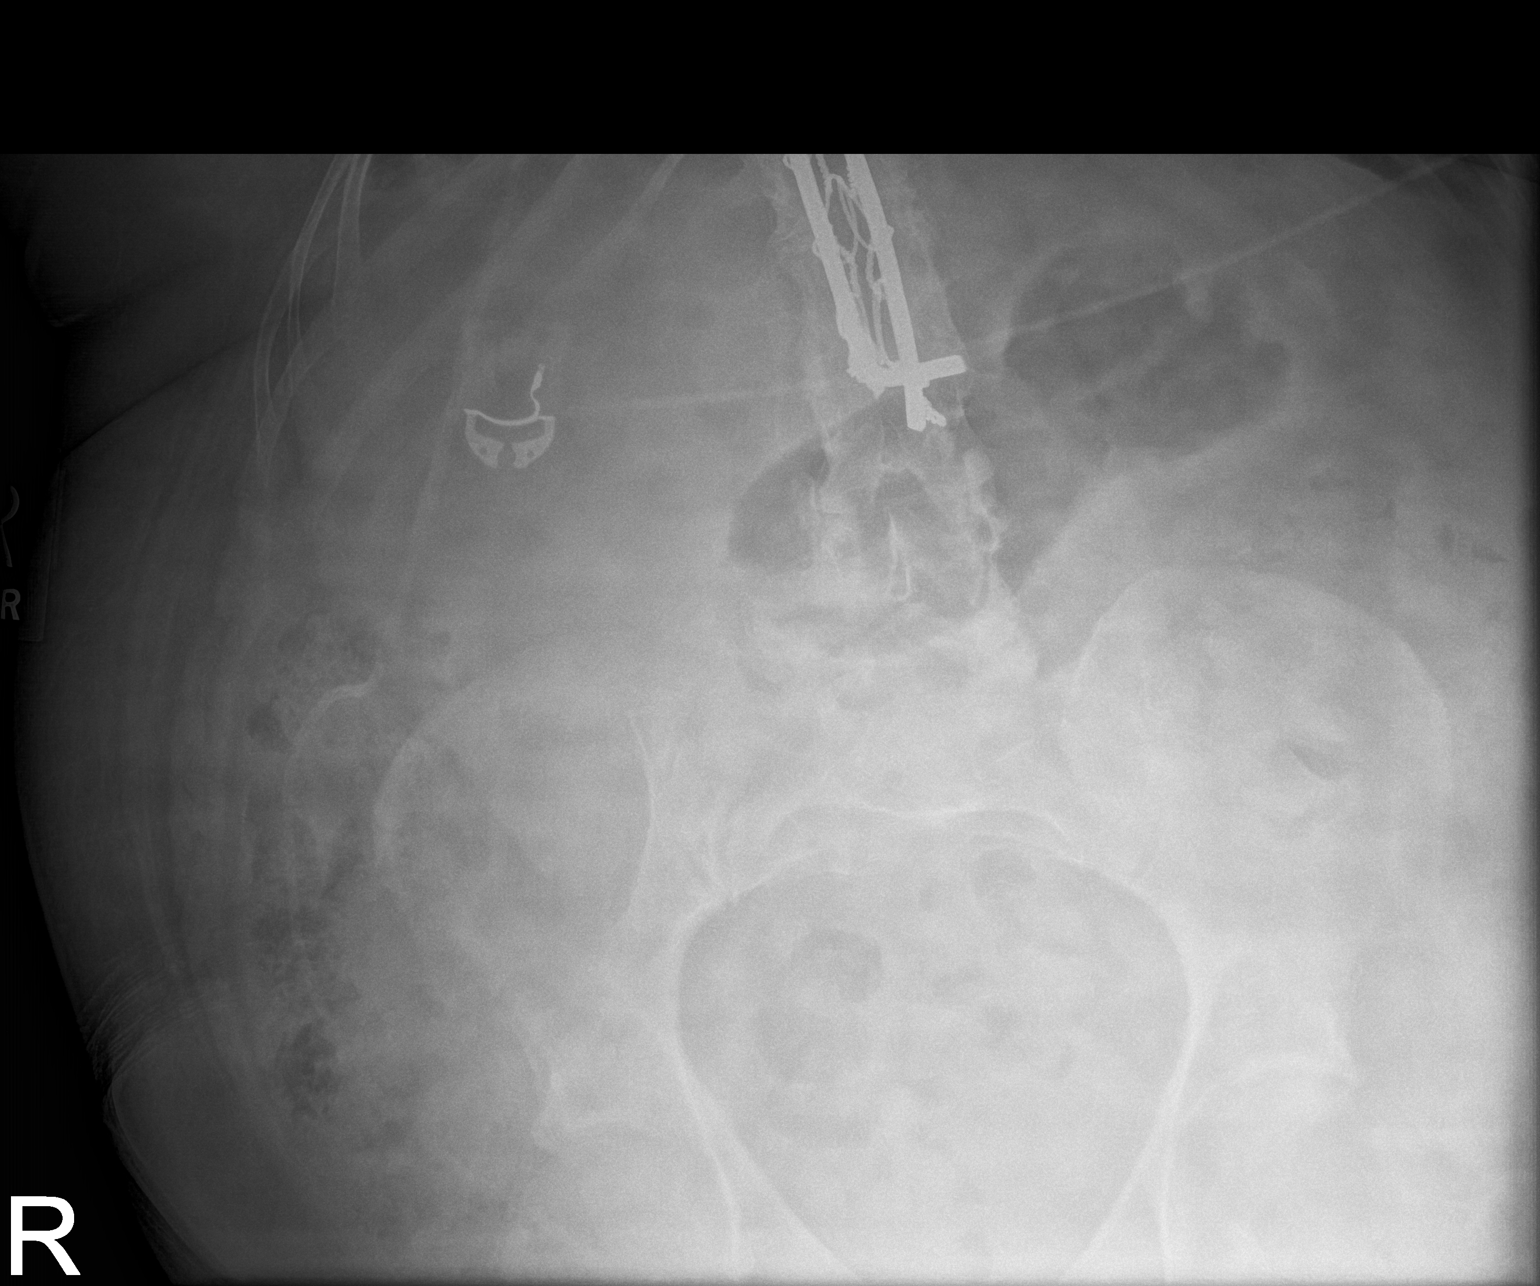

[abdomen supine (3 of 3)]
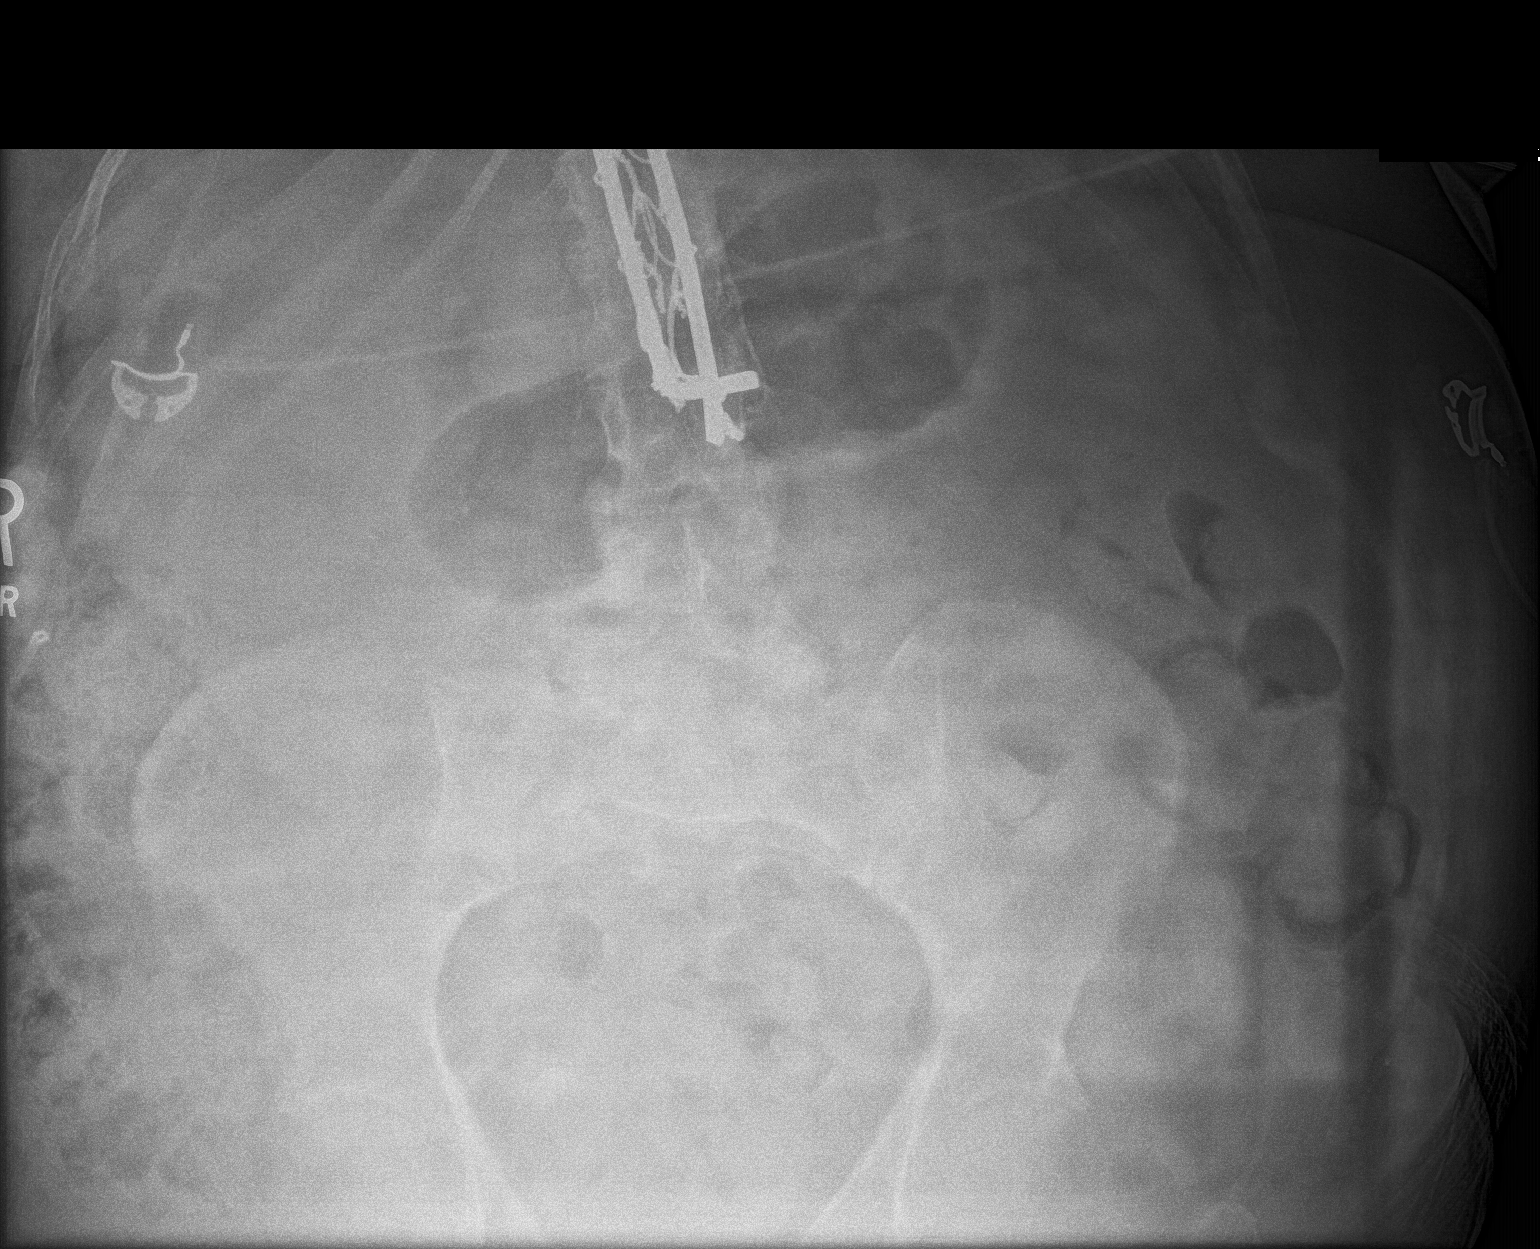

[3 of 3 positions shown; findings below may reference images not displayed]

FINDINGS: No abnormal bowel dilatation is noted. Large amount of stool is seen
throughout the colon. No radio-opaque calculi or other significant
radiographic abnormality are seen.
IMPRESSION: Large stool burden is noted. No evidence of bowel obstruction or
ileus.

## 2020-12-04 ENCOUNTER — Other Ambulatory Visit: Payer: Self-pay | Admitting: Family Medicine

## 2020-12-04 ENCOUNTER — Ambulatory Visit: Payer: Medicare Other | Admitting: Podiatry

## 2020-12-04 DIAGNOSIS — I5032 Chronic diastolic (congestive) heart failure: Secondary | ICD-10-CM

## 2020-12-04 DIAGNOSIS — E1169 Type 2 diabetes mellitus with other specified complication: Secondary | ICD-10-CM

## 2020-12-17 ENCOUNTER — Other Ambulatory Visit: Payer: Self-pay | Admitting: Cardiovascular Disease

## 2020-12-17 NOTE — Telephone Encounter (Signed)
Please contact pt for future appointment. Pt due for 6 month f/u. 

## 2020-12-17 NOTE — Telephone Encounter (Signed)
Patient calling request refill

## 2020-12-18 ENCOUNTER — Ambulatory Visit: Payer: Medicare Other | Admitting: Podiatry

## 2020-12-25 ENCOUNTER — Ambulatory Visit: Payer: Medicare Other | Admitting: Podiatry

## 2020-12-29 ENCOUNTER — Encounter: Payer: Self-pay | Admitting: Family Medicine

## 2020-12-30 ENCOUNTER — Other Ambulatory Visit: Payer: Self-pay | Admitting: Cardiovascular Disease

## 2021-01-12 NOTE — Progress Notes (Deleted)
Cardiology Office Note    Date:  01/12/2021   ID:  Scott [name], DOB 1970-04-10, MRN [id number]  PCP:  Margo Common, PA-C (Inactive)  Cardiologist:  Ida Rogue, MD  Electrophysiologist:  None   Chief Complaint: Follow-up  History of Present Illness:   Scott [name] is a 50 y.o. male with history of Klippel-File syndrome, chronic hypoxic respiratory failure secondary to severe restrictive lung disease, airway hyperactivity, and COPD, HFpEF, HTN, HLD, asthma, alopecia, migraine disorder, open-angle glaucoma, depression, arthritis, anxiety, obesity, kyphoscoliosis, and GERD who presents for follow-up of ***.   He was seen in the ED in 02/2018 for dyspnea and subsequently diagnosed with HFpEF. Echo in 02/2018 showed an EF of 55-60%. Lexiscan MPI in 02/2018 showed no evidence of ischemia. Sleep study in 03/2018 showed no evidence of sleep apnea. Pulmonary function testing has shown severe restrictive lung disease. He has previously been established with pulmonology, though was dismissed from their practice for multiple no shows, last seeing them in 01/2018. He was previously followed by Dr. Fletcher Anon, though has subsequently transitioned to Dr. Rockey Situ. He is also followed by the [facility].  He has been seen numerous times for complaints of ongoing dyspnea and reported weight gain leading to adjustment of his diuretic regimen.  He has been advised multiple times to reestablish with pulmonology.  It has been felt his dyspnea is multifactorial including severe restrictive lung disease, underlying COPD, kyphoscoliosis, obesity, physical deconditioning, and diastolic dysfunction.  He was admitted to the hospital in 12/2019 for COPD exacerbation.  D-dimer was normal.  Lower extremity ultrasound was negative for DVT bilaterally.  Most recent echo from 02/2020 showed an EF of 60 to 65%, no regional wall motion abnormalities,, no regional wall motion abnormalities, normal LV diastolic function  parameters, normal RV systolic function and ventricular cavity size, no significant valvular abnormalities, and an estimated right atrial pressure of 8 mmHg.  He was last seen in the office in 06/2020 and was consuming high volume liquids including soda and Gatorade.  His weight was down 3 pounds when compared to his visit in 02/2020.  It was recommended he cut back on his fluid intake and continue torsemide 60 mg daily.  ***   Labs independently reviewed: 08/2020 - TSH normal, TC 211, TG 115, HDL 75, LDL 116, A1c 8.3, BUN 16, serum creatinine 1.09, potassium 4.6, albumin 4.0, AST/ALT normal, Hgb 13.5, PLT 331  Past Medical History:  Diagnosis Date   Acid reflux    Anxiety    Arthritis    Asthma    CHF (congestive heart failure) ([place])    Depression    Glaucoma    Hyperlipidemia    Hypertension    Klippel-Feil syndrome     Past Surgical History:  Procedure Laterality Date   COLOSTOMY     DENVER SHUNT PLACEMENT      Current Medications: No outpatient medications have been marked as taking for the 01/14/21 encounter (Appointment) with Rise Mu, PA-C.    Allergies:   Codeine   Social History   Socioeconomic History   Marital status: Significant Other    Spouse name: Not on file   Number of children: 0   Years of education: Not on file   Highest education level: Some college, no degree  Occupational History   Occupation: disability  Tobacco Use   Smoking status: Former    Packs/day: 1.00    Years: 10.00    Pack years: 10.00  Types: Cigarettes    Quit date: 05/2006    Years since quitting: 14.6   Smokeless tobacco: Never   Tobacco comments:    quit 05/2006  Vaping Use   Vaping Use: Former   Devices: tried once  Substance and Sexual Activity   Alcohol use: No    Alcohol/week: 0.0 standard drinks   Drug use: No   Sexual activity: Not on file  Other Topics Concern   Not on file  Social History Narrative   Not on file   Social Determinants of Health    Financial Resource Strain: Low Risk    Difficulty of Paying Living Expenses: Not very hard  Food Insecurity: No Food Insecurity   Worried About Charity fundraiser in the Last Year: Never true   Ran Out of Food in the Last Year: Never true  Transportation Needs: No Transportation Needs   Lack of Transportation (Medical): No   Lack of Transportation (Non-Medical): No  Physical Activity: Inactive   Days of Exercise per Week: 0 days   Minutes of Exercise per Session: 0 min  Stress: Stress Concern Present   Feeling of Stress : Rather much  Social Connections: Moderately Isolated   Frequency of Communication with Friends and Family: More than three times a week   Frequency of Social Gatherings with Friends and Family: More than three times a week   Attends Religious Services: Never   Marine scientist or Organizations: Yes   Attends Music therapist: More than 4 times per year   Marital Status: Never married     Family History:  The patient's family history includes Cancer in his paternal grandfather; Colon cancer in his father; Heart disease in his father; Lung cancer in his maternal grandmother; Multiple sclerosis in his mother; Ovarian cancer in his paternal grandmother; Thyroid cancer in his maternal grandfather.  ROS:   ROS   EKGs/Labs/Other Studies Reviewed:    Studies reviewed were summarized above. The additional studies were reviewed today:  2D echo 02/2020: 1. Left ventricular ejection fraction, by estimation, is 60 to 65%. The  left ventricle has normal function. The left ventricle has no regional  wall motion abnormalities. Left ventricular diastolic parameters were  normal.   2. Right ventricular systolic function is normal. The right ventricular  size is normal.   3. The mitral valve is normal in structure. No evidence of mitral valve  regurgitation. No evidence of mitral stenosis.   4. The aortic valve is normal in structure. Aortic valve  regurgitation is  not visualized. No aortic stenosis is present.   5. The inferior vena cava is dilated in size with >50% respiratory  variability, suggesting right atrial pressure of 8 mmHg.  __________  2D echo 02/2018: - Left ventricle: The cavity size was normal. Wall thickness was    normal. Systolic function was normal. The estimated ejection    fraction was in the range of 55% to 60%. Wall motion was normal;    there were no regional wall motion abnormalities. Doppler    parameters are consistent with abnormal left ventricular    relaxation (grade 1 diastolic dysfunction).  - Left atrium: The atrium was normal in size.  - Right ventricle: Systolic function was normal.  - Pulmonary arteries: Systolic pressure could not be accurately    estimated.  ___________   Carlton Adam MPI 02/2018: Pharmacological myocardial perfusion imaging study with no significant  Ischemia Small region of fixed apical defect, likely attenuation artifact. Normal  wall motion, EF estimated at 35% (depressed secondary to GI uptake artifact) No EKG changes concerning for ischemia at peak stress or in recovery. Low risk scan, normal EF on echocardiogram, >55%    EKG:  EKG is ordered today.  The EKG ordered today demonstrates ***  Recent Labs: 01/24/2020: BNP 20.7 08/15/2020: ALT 20; BUN 16; Creatinine, Ser 1.09; Hemoglobin 13.5; Platelets 331; Potassium 4.6; Sodium 137; TSH 1.400  Recent Lipid Panel    Component Value Date/Time   CHOL 211 (H) 08/15/2020 1033   TRIG 115 08/15/2020 1033   HDL 75 08/15/2020 1033   CHOLHDL 3.3 07/01/2020 1318   VLDL 29 07/01/2020 1318   LDLCALC 116 (H) 08/15/2020 1033    PHYSICAL EXAM:    VS:  There were no vitals taken for this visit.  BMI: There is no height or weight on file to calculate BMI.  Physical Exam  Wt Readings from Last 3 Encounters:  08/12/20 213 lb (96.6 kg)  06/30/20 209 lb (94.8 kg)  05/16/20 213 lb 6.4 oz (96.8 kg)     ASSESSMENT & PLAN:    Chronic hypoxic respiratory failure:  HFpEF:  Obesity:  Restrictive lung disease/COPD:  Disposition: F/u with Dr. Rockey Situ or an APP in ***.   Medication Adjustments/Labs and Tests Ordered: Current medicines are reviewed at length with the patient today.  Concerns regarding medicines are outlined above. Medication changes, Labs and Tests ordered today are summarized above and listed in the Patient Instructions accessible in Encounters.   Signed, Christell Faith, PA-C 01/12/2021 7:51 AM     [facility] [address] [place] [address] [place],  [address] [phone]

## 2021-01-14 ENCOUNTER — Ambulatory Visit: Payer: Medicare Other | Admitting: Physician Assistant

## 2021-01-15 ENCOUNTER — Ambulatory Visit (INDEPENDENT_AMBULATORY_CARE_PROVIDER_SITE_OTHER): Payer: Medicare Other

## 2021-01-15 DIAGNOSIS — I5032 Chronic diastolic (congestive) heart failure: Secondary | ICD-10-CM

## 2021-01-15 DIAGNOSIS — E1169 Type 2 diabetes mellitus with other specified complication: Secondary | ICD-10-CM

## 2021-01-15 DIAGNOSIS — Z9181 History of falling: Secondary | ICD-10-CM

## 2021-01-15 NOTE — Patient Instructions (Addendum)
Thank you for allowing the Chronic Care Management team to participate in your care.    Patient Care Plan: Diabetes Type 2 (Adult)     Problem Identified: Glycemic Management (Diabetes, Type 2)      Long-Range Goal: Glycemic Management Optimized   Start Date: 01/15/2021  Expected End Date: 04/15/2021  Priority: Medium  Note:    Objective:  Lab Results  Component Value Date   HGBA1C 8.3 (H) 08/15/2020   Lab Results  Component Value Date   CREATININE 1.09 08/15/2020   CREATININE 1.16 07/01/2020   CREATININE 1.31 (H) 02/08/2020   Lab Results  Component Value Date   EGFR 83 08/15/2020   Current Barriers:  Chronic Disease Management support and educational  support related to Diabetes.  Case Manager Clinical Goal(s):  Over the next 90 days, patient will demonstrate improved adherence to prescribed treatment plan for diabetes self care/management as evidenced by taking medications as prescribed, daily monitoring and recording of CBG's and adhering to an ADA/ carb modified diet.  Interventions:  Collaboration with [name], [name], PA-C (Inactive) regarding development and update of comprehensive plan of care as evidenced by provider attestation and co-signature Inter-disciplinary care team collaboration (see longitudinal plan of care) Reviewed medications. Reports taking medications as prescribed. He started Ozempic earlier this month and hoping to see improvements with his A1C with consistent use. Provided information regarding importance of consistent blood glucose monitoring. Reports fasting readings have been slightly elevated. Readings this week ranged from the 140's to 150. He reports significant improvements with monitoring carbs and dietary intake. Attributes some of the elevations to stress r/t life changes.  Encouraged to continue monitoring and maintain a log. Reviewed s/sx of hypoglycemia and hyperglycemia along with recommended interventions. Discussed and offered  referrals for available Diabetes education classes. Declined need for additional educational resources. Will reconsider if his A1C does not improve with Ozempic. Discussed importance of completing recommended DM preventive care. He continues to experience neuropathy. Reports completing daily foot care as advised. His eye exam is up to date/completed approximately three months ago.  Patient Goals/Self-Care Activities Self-administer medications as prescribed Attend all scheduled provider appointments Monitor blood glucose levels consistently and utilize recommended interventions Adhere to prescribed ADA/carb modified  Notify provider or care management team with questions and new concerns as needed    Follow Up Plan:  Will follow up next month    Patient Care Plan: COPD (Adult)     Problem Identified: Symptom Exacerbation (COPD)      Long-Range Goal: Symptom Exacerbation Prevented or Minimized   Start Date: 01/15/2021  Expected End Date: 04/15/2021  Priority: High  Note:   Current Barriers:  Chronic Disease Management support and educational needs related to COPD.  Case Manager Clinical Goal(s): Over the next 90 days, patient will not require hospitalization due to complications related to COPD exacerbation.  Interventions:  Collaboration with [name], [name], PA-C (Inactive) regarding development and update of comprehensive plan of care as evidenced by provider attestation and co-signature Inter-disciplinary care team collaboration (see longitudinal plan of care) Provided verbal COPD education on self care/management/and exacerbation prevention. Encouraged to take needed precautions and avoid extreme temperatures and avoid overexertion to prevent exacerbation. Discussed current action plan and reinforced importance of daily self assessment. Reports using supplemental oxygen when needed. Denies cough, congestion or shortness of breath at rest. Advised to make an appointment with  his provider if experiencing symptoms for 48 hours without improvement.  Reviewed worsening symptoms that require immediate medical attention.  Provided information regarding infection prevention and increased risk r/t COPD.  Advised to utilize  prevention strategies to reduce risk of respiratory infection    Self-Care Activities/Patient Goals: Take medications and utilize inhalers as prescribed Assess symptoms daily and notify provider if experiencing symptoms for 48 hrs without improvement Avoid extreme temperatures and strenuous activites Follow recommendations to prevent respiratory infection Notify provider or care management team with questions and new concerns as needed   Follow Up Plan:  Will follow up next month      Scott [name] verbalized understanding of the information discussed during the telephonic outreach. Declined need for mailed/printed instructions. A member of the care management team will follow up next month.   Scott [name] Heath/[facility] Care Management [facility] [phone]

## 2021-01-15 NOTE — Chronic Care Management (AMB) (Signed)
Chronic Care Management   CCM RN Visit Note  01/15/2021 Name: Scott [name] MRN: [id number] DOB: 06/23/1970  Subjective: Scott [name] is a 50 y.o. year old male who is a primary care patient of [name], [name], PA-C (Inactive). The care management team was consulted for assistance with disease management and care coordination needs.    Engaged with patient by telephone for follow up visit in response to provider referral for case management and care coordination services.   Consent to Services:  The patient was given information about Chronic Care Management services, agreed to services, and gave verbal consent prior to initiation of services.  Please see initial visit note for detailed documentation.    Assessment: Review of patient past medical history, allergies, medications, health status, including review of consultants reports, laboratory and other test data, was performed as part of comprehensive evaluation and provision of chronic care management services.   SDOH (Social Determinants of Health) assessments and interventions performed:  No  CCM Care Plan  Allergies  Allergen Reactions   Codeine Shortness Of Breath    Outpatient Encounter Medications as of 01/15/2021  Medication Sig   albuterol (VENTOLIN HFA) 108 (90 Base) MCG/ACT inhaler Inhale 2 puffs into the lungs every 4 (four) hours as needed for wheezing or shortness of breath.   ANDROGEL PUMP 20.25 MG/ACT (1.62%) GEL Apply 1 application topically daily.    bisoprolol (ZEBETA) 5 MG tablet TAKE 1/2 TABLET(2.5 MG) BY MOUTH EVERY OTHER DAY   blood glucose meter kit and supplies Use 1-2 times a day as needed.Dispense based on patient and insurance preference. (FOR ICD-10 E10.9, E11.9).   budesonide-formoterol (SYMBICORT) 80-4.5 MCG/ACT inhaler SMARTSIG:2 Puff(s) By Mouth Twice Daily   FARXIGA 5 MG TABS tablet TAKE 1 TABLET(5 MG) BY MOUTH DAILY BEFORE AND BREAKFAST   FARXIGA 5 MG TABS tablet TAKE 1 TABLET BY MOUTH DAILY  BEFORE BREAKFAST   glucosamine-chondroitin 500-400 MG tablet Take 2 tablets by mouth daily.   glucose blood (ONETOUCH VERIO) test strip USE TO CHECK BLOOD SUGAR EVERY DAY   Lancets (ONETOUCH DELICA PLUS CBSWHQ75F) MISC 1 each by Does not apply route daily.   Lancets (ONETOUCH ULTRASOFT) lancets Use with testing once daily and as neededUse as instructed   nystatin ointment (MYCOSTATIN) Apply 1 application topically 2 (two) times daily.   PARoxetine (PAXIL) 40 MG tablet TAKE 1 TABLET(40 MG) BY MOUTH DAILY   potassium chloride SA (KLOR-CON) 20 MEQ tablet TAKE 2 TABLETS(40 MEQ) BY MOUTH TWICE DAILY   spironolactone (ALDACTONE) 50 MG tablet Take 1 tablet (50 mg total) by mouth daily.   torsemide (DEMADEX) 20 MG tablet TAKE 3 TABLETS(60 MG) BY MOUTH DAILY   No facility-administered encounter medications on file as of 01/15/2021.    Patient Active Problem List   Diagnosis Date Noted   Fungal rash of right side face.  05/19/2020   Fall from slipping on ice 05/19/2020   Pain due to onychomycosis of toenails of both feet 03/13/2020   Diabetes mellitus without complication ([place]) 16/38/4665   History of bradycardia 02/13/2020   COPD exacerbation ([place]) 12/30/2019   Acute on chronic respiratory failure with hypoxia ([place]) 12/30/2019   Depression 12/30/2019   Acute renal failure superimposed on stage 2 chronic kidney disease ([place]) 12/30/2019   Chronic diastolic heart failure ([place]) 02/16/2018   Accelerated hypertension 99/35/7017   Acute diastolic heart failure ([place])    Kyphoscoliosis and scoliosis 01/30/2018   Erectile dysfunction 09/09/2016   Disorder of bursae of shoulder  region 09/07/2016   Impingement syndrome of shoulder region 09/07/2016   Anxiety 09/11/2014   Airway hyperreactivity 09/11/2014   CN (constipation) 09/11/2014   Alopecia, male pattern 09/11/2014   Decreased libido 09/11/2014   Dysfunction of eustachian tube 09/11/2014   Groin strain 09/11/2014   Cephalalgia 16/96/7893    Umbilical hernia 81/04/7508   Essential hypertension 09/11/2014   Migraine 09/11/2014   Neoplasm of uncertain behavior 25/85/2778   Hernia of anterior abdominal wall 09/11/2014   Contact dermatitis due to Genus Toxicodendron 09/11/2014   Open-angle glaucoma 05/20/2009   Congenital hydrocephalus ([place]) 05/20/2009   Klippel-Feil syndrome 05/20/2009   Enteritis presumed infectious 06/11/2008   HLD (hyperlipidemia) 03/15/2008   Clinical depression 06/13/2007    Conditions to be addressed/monitored:DMII and COPD Patient Care Plan: Diabetes Type 2 (Adult)     Problem Identified: Glycemic Management (Diabetes, Type 2)      Long-Range Goal: Glycemic Management Optimized   Start Date: 01/15/2021  Expected End Date: 04/15/2021  Priority: Medium  Note:    Objective:  Lab Results  Component Value Date   HGBA1C 8.3 (H) 08/15/2020   Lab Results  Component Value Date   CREATININE 1.09 08/15/2020   CREATININE 1.16 07/01/2020   CREATININE 1.31 (H) 02/08/2020   Lab Results  Component Value Date   EGFR 83 08/15/2020   Current Barriers:  Chronic Disease Management support and educational  support related to Diabetes.  Case Manager Clinical Goal(s):  Over the next 90 days, patient will demonstrate improved adherence to prescribed treatment plan for diabetes self care/management as evidenced by taking medications as prescribed, daily monitoring and recording of CBG's and adhering to an ADA/ carb modified diet.  Interventions:  Collaboration with [name], [name], PA-C (Inactive) regarding development and update of comprehensive plan of care as evidenced by provider attestation and co-signature Inter-disciplinary care team collaboration (see longitudinal plan of care) Reviewed medications. Reports taking medications as prescribed. He started Ozempic earlier this month and hoping to see improvements with his A1C with consistent use. Provided information regarding importance of consistent  blood glucose monitoring. Reports fasting readings have been slightly elevated. Readings this week ranged from the 140's to 150. He reports significant improvements with monitoring carbs and dietary intake. Attributes some of the elevations to stress r/t life changes.  Encouraged to continue monitoring and maintain a log. Reviewed s/sx of hypoglycemia and hyperglycemia along with recommended interventions. Discussed and offered referrals for available Diabetes education classes. Declined need for additional educational resources. Will reconsider if his A1C does not improve with Ozempic. Discussed importance of completing recommended DM preventive care. He continues to experience neuropathy. Reports completing daily foot care as advised. His eye exam is up to date/completed approximately three months ago.  Patient Goals/Self-Care Activities Self-administer medications as prescribed Attend all scheduled provider appointments Monitor blood glucose levels consistently and utilize recommended interventions Adhere to prescribed ADA/carb modified  Notify provider or care management team with questions and new concerns as needed    Follow Up Plan:  Will follow up next month    Patient Care Plan: COPD (Adult)     Problem Identified: Symptom Exacerbation (COPD)      Long-Range Goal: Symptom Exacerbation Prevented or Minimized   Start Date: 01/15/2021  Expected End Date: 04/15/2021  Priority: High  Note:   Current Barriers:  Chronic Disease Management support and educational needs related to COPD.  Case Manager Clinical Goal(s): Over the next 90 days, patient will not require hospitalization due to complications related  to COPD exacerbation.  Interventions:  Collaboration with [name], [name], PA-C (Inactive) regarding development and update of comprehensive plan of care as evidenced by provider attestation and co-signature Inter-disciplinary care team collaboration (see longitudinal plan  of care) Provided verbal COPD education on self care/management/and exacerbation prevention. Encouraged to take needed precautions and avoid extreme temperatures and avoid overexertion to prevent exacerbation. Discussed current action plan and reinforced importance of daily self assessment. Reports using supplemental oxygen when needed. Denies cough, congestion or shortness of breath at rest. Advised to make an appointment with his provider if experiencing symptoms for 48 hours without improvement.  Reviewed worsening symptoms that require immediate medical attention.  Provided information regarding infection prevention and increased risk r/t COPD.  Advised to utilize  prevention strategies to reduce risk of respiratory infection    Self-Care Activities/Patient Goals: Take medications and utilize inhalers as prescribed Assess symptoms daily and notify provider if experiencing symptoms for 48 hrs without improvement Avoid extreme temperatures and strenuous activites Follow recommendations to prevent respiratory infection Notify provider or care management team with questions and new concerns as needed   Follow Up Plan:  Will follow up next month      PLAN A member of the care management team will follow up next month.   Cristy Friedlander Heath/[facility] Care Management [facility] [phone]

## 2021-01-16 ENCOUNTER — Ambulatory Visit: Payer: Self-pay

## 2021-01-16 DIAGNOSIS — E119 Type 2 diabetes mellitus without complications: Secondary | ICD-10-CM

## 2021-01-16 DIAGNOSIS — Z789 Other specified health status: Secondary | ICD-10-CM

## 2021-01-16 DIAGNOSIS — I5032 Chronic diastolic (congestive) heart failure: Secondary | ICD-10-CM

## 2021-01-16 NOTE — Chronic Care Management (AMB) (Signed)
Chronic Care Management   CCM RN Visit Note  01/16/2021 Name: Scott [name] MRN: [id number] DOB: [birth year]  Subjective: Scott [name] is a 50 y.o. year old male who is a primary care patient of [name], [name], PA-C (Inactive). The care management team was consulted for assistance with disease management and care coordination needs.    Follow up care coordination was conducted today in response to patient's request for assistance with utilities/gas. Consent to Services:  The patient was given information about Chronic Care Management services, agreed to services, and gave verbal consent prior to initiation of services.  Please see initial visit note for detailed documentation.   Assessment: Review of patient past medical history, allergies, medications, health status, including review of consultants reports, laboratory and other test data, was performed as part of comprehensive evaluation and provision of chronic care management services.   SDOH (Social Determinants of Health) assessments and interventions performed:   SDOH Interventions    Flowsheet Row Most Recent Value  SDOH Interventions   Financial Strain Interventions [id number] Referral        CCM Care Plan  Allergies  Allergen Reactions   Codeine Shortness Of Breath    Outpatient Encounter Medications as of 01/16/2021  Medication Sig   albuterol (VENTOLIN HFA) 108 (90 Base) MCG/ACT inhaler Inhale 2 puffs into the lungs every 4 (four) hours as needed for wheezing or shortness of breath.   ANDROGEL PUMP 20.25 MG/ACT (1.62%) GEL Apply 1 application topically daily.    bisoprolol (ZEBETA) 5 MG tablet TAKE 1/2 TABLET(2.5 MG) BY MOUTH EVERY OTHER DAY   blood glucose meter kit and supplies Use 1-2 times a day as needed.Dispense based on patient and insurance preference. (FOR ICD-10 E10.9, E11.9).   budesonide-formoterol (SYMBICORT) 80-4.5 MCG/ACT inhaler SMARTSIG:2 Puff(s) By Mouth Twice Daily   FARXIGA 5 MG TABS tablet TAKE 1  TABLET(5 MG) BY MOUTH DAILY BEFORE AND BREAKFAST   FARXIGA 5 MG TABS tablet TAKE 1 TABLET BY MOUTH DAILY BEFORE BREAKFAST   glucosamine-chondroitin 500-400 MG tablet Take 2 tablets by mouth daily.   glucose blood (ONETOUCH VERIO) test strip USE TO CHECK BLOOD SUGAR EVERY DAY   Lancets (ONETOUCH DELICA PLUS [id number]) MISC 1 each by Does not apply route daily.   Lancets (ONETOUCH ULTRASOFT) lancets Use with testing once daily and as neededUse as instructed   nystatin ointment (MYCOSTATIN) Apply 1 application topically 2 (two) times daily.   PARoxetine (PAXIL) 40 MG tablet TAKE 1 TABLET(40 MG) BY MOUTH DAILY   potassium chloride SA (KLOR-CON) 20 MEQ tablet TAKE 2 TABLETS(40 MEQ) BY MOUTH TWICE DAILY   spironolactone (ALDACTONE) 50 MG tablet Take 1 tablet (50 mg total) by mouth daily.   torsemide (DEMADEX) 20 MG tablet TAKE 3 TABLETS(60 MG) BY MOUTH DAILY   No facility-administered encounter medications on file as of 01/16/2021.    Patient Care Plan: COPD (Adult)     Problem Identified: Symptom Exacerbation (COPD)      Long-Range Goal: Symptom Exacerbation Prevented or Minimized   Start Date: 01/15/2021  Expected End Date: 04/15/2021  Priority: High  Note:   Current Barriers:  Chronic Disease Management support and educational needs related to COPD.  Case Manager Clinical Goal(s): Over the next 90 days, patient will not require hospitalization due to complications related to COPD exacerbation.  Interventions:  Collaboration with [name], [name], PA-C (Inactive) regarding development and update of comprehensive plan of care as evidenced by provider attestation and co-signature Inter-disciplinary care team collaboration (see  longitudinal plan of care) Provided verbal COPD education on self care/management/and exacerbation prevention. Encouraged to take needed precautions and avoid extreme temperatures and avoid overexertion to prevent exacerbation. Discussed current action plan and  reinforced importance of daily self assessment. Reports using supplemental oxygen when needed. Denies cough, congestion or shortness of breath at rest. Advised to make an appointment with his provider if experiencing symptoms for 48 hours without improvement.  Reviewed worsening symptoms that require immediate medical attention.  Provided information regarding infection prevention and increased risk r/t COPD.  Advised to utilize  prevention strategies to reduce risk of respiratory infection. Update on 01/16/21: Patient expressed concerns regarding the cooler weather and not being able to warm his home. He requires assistance with utilities/gas. Agreeable to outreach with a [employer]. Referral submitted for financial assistance with utilities/gas.   Self-Care Activities/Patient Goals: Take medications and utilize inhalers as prescribed Assess symptoms daily and notify provider if experiencing symptoms for 48 hrs without improvement Avoid extreme temperatures and strenuous activites Follow recommendations to prevent respiratory infection Notify provider or care management team with questions and new concerns as needed   Follow Up Plan:  Will follow up next month      PLAN Referral placed for outreach with a [employer] A member of the care management team will follow up within the next month.   Cristy Friedlander [facility] Care Management [facility] [phone]

## 2021-01-29 ENCOUNTER — Telehealth: Payer: Self-pay | Admitting: Cardiovascular Disease

## 2021-01-29 ENCOUNTER — Ambulatory Visit: Payer: Self-pay

## 2021-01-29 DIAGNOSIS — E1169 Type 2 diabetes mellitus with other specified complication: Secondary | ICD-10-CM

## 2021-01-29 MED ORDER — BISOPROLOL FUMARATE 5 MG PO TABS
ORAL_TABLET | ORAL | 0 refills | Status: DC
Start: 2021-01-29 — End: 2021-02-03

## 2021-01-29 NOTE — Telephone Encounter (Signed)
I have reviewed the patient's chart regarding his bisoprolol RX.  The patient saw Laurann Montana, NP on 02/27/20 and was advised to decrease bisoprolol to 2.5 mg QOD. When he was seen by Dr. Rockey Situ on 06/30/20, his intake medication list stated he was taking bisoprolol 5 mg- 0.5 tablet (2.5 mg) by mouth once daily.  I called the patient and advised him of the above. He is aware I could not find any documentation stating he was to increase the bisoprolol back up to 2.5 mg once daily, but he has been taking his medication this way since his March visit with Dr. Rockey Situ. The patient has a follow up appointment with Christell Faith, PA on 02/03/21. I have advised I will refill his bisoprolol for 5 mg- 0.5 tablet (2.5 mg) by mouth once daily and we will follow up at his upcoming visit to insure this is the correct dose.   The patient voices understanding and is agreeable.  Will forward to [employer], PA as an [place].

## 2021-01-29 NOTE — Telephone Encounter (Signed)
Pt c/o medication issue:  1. Name of Medication: bisoprolol   2. How are you currently taking this medication (dosage and times per day)? 2.5 mg po q d   3. Are you having a reaction (difficulty breathing--STAT)? no  4. What is your medication issue? Out of rx early because patient has been taking every day and rx says EOD   Please advise and or send a new rx so insurance will pay

## 2021-01-29 NOTE — Patient Instructions (Addendum)
Thank you for allowing the Chronic Care Management team to participate in your care.    Patient Care Plan: Diabetes Type 2 (Adult)     Problem Identified: Glycemic Management (Diabetes, Type 2)      Long-Range Goal: Glycemic Management Optimized   Start Date: 01/15/2021  Expected End Date: 04/15/2021  Priority: Medium  Note:    Current Barriers:  Chronic Disease Management support and educational  support related to Diabetes.  Case Manager Clinical Goal(s):  Over the next 90 days, patient will demonstrate improved adherence to prescribed treatment plan for diabetes self care/management as evidenced by taking medications as prescribed, daily monitoring and recording of CBG's and adhering to an ADA/ carb modified diet.  Interventions:  Collaboration with [name], [name], PA-C (Inactive) regarding development and update of comprehensive plan of care as evidenced by provider attestation and co-signature Inter-disciplinary care team collaboration (see longitudinal plan of care) Reviewed medications. Reports taking medications as prescribed. He started Ozempic earlier this month and hoping to see improvements with his A1C with consistent use. Provided information regarding importance of consistent blood glucose monitoring. Reports fasting readings have been slightly elevated. Readings this week ranged from the 140's to 150. He reports significant improvements with monitoring carbs and dietary intake. Attributes some of the elevations to stress r/t life changes.  Encouraged to continue monitoring and maintain a log. Reviewed s/sx of hypoglycemia and hyperglycemia along with recommended interventions. Discussed and offered referrals for available Diabetes education classes. Declined need for additional educational resources. Will reconsider if his A1C does not improve with Ozempic. Discussed importance of completing recommended DM preventive care. He continues to experience neuropathy. Reports  completing daily foot care as advised. His eye exam is up to date/completed approximately three months ago. Update on 01/29/21. Reports some fluctuations with readings but reports overall readings have improved. He expressed concerns regarding prescription refills d/t recent changes with his PCP. Contacted central scheduling to schedule a clinic visit. Agreed to update the care management team if additional assistance is required.   Patient Goals/Self-Care Activities Self-administer medications as prescribed Attend all scheduled provider appointments Monitor blood glucose levels consistently and utilize recommended interventions Adhere to prescribed ADA/carb modified  Notify provider or care management team with questions and new concerns as needed    Follow Up Plan:  Will follow up next month    Patient Care Plan: COPD (Adult)     Problem Identified: Symptom Exacerbation (COPD)      Long-Range Goal: Symptom Exacerbation Prevented or Minimized   Start Date: 01/15/2021  Expected End Date: 04/15/2021  Priority: High  Note:   Current Barriers:  Chronic Disease Management support and educational needs related to COPD.  Case Manager Clinical Goal(s): Over the next 90 days, patient will not require hospitalization due to complications related to COPD exacerbation.  Interventions:  Collaboration with [name], [name], PA-C (Inactive) regarding development and update of comprehensive plan of care as evidenced by provider attestation and co-signature Inter-disciplinary care team collaboration (see longitudinal plan of care) Provided verbal COPD education on self care/management/and exacerbation prevention. Encouraged to take needed precautions and avoid extreme temperatures and avoid overexertion to prevent exacerbation. Discussed current action plan and reinforced importance of daily self assessment. Reports using supplemental oxygen when needed. Denies cough, congestion or shortness of  breath at rest. Advised to make an appointment with his provider if experiencing symptoms for 48 hours without improvement.  Reviewed worsening symptoms that require immediate medical attention.  Provided information regarding infection prevention  and increased risk r/t COPD.  Advised to utilize  prevention strategies to reduce risk of respiratory infection. Update on 01/29/21:  Expressed concerns regarding ability to obtain needed medication refills. Reports symptoms related to COPD and anxiety are currently controlled. Wants to ensure medications including Paxil can be refilled by his new PCP. Central scheduling was contacted. He will be evaluated in the clinic for medication review.   Self-Care Activities/Patient Goals: Take medications and utilize inhalers as prescribed Assess symptoms daily and notify provider if experiencing symptoms for 48 hrs without improvement Avoid extreme temperatures and strenuous activites Follow recommendations to prevent respiratory infection Notify provider or care management team with questions and new concerns as needed   Follow Up Plan:  Will follow up next month       Scott [name] verbalized understanding of the information discussed during the telephonic outreach. Declined need for mailed/printed instructions. A member of the care management team will follow up within the next month.   Scott [name] [facility] Care Management [facility] [phone]

## 2021-01-29 NOTE — Chronic Care Management (AMB) (Signed)
Chronic Care Management   CCM RN Visit Note  01/29/2021 Name: Scott [name] MRN: [id number] DOB: [birth year]  Subjective: Scott [name] is a 50 y.o. year old male who is a primary care patient of [name], [name], PA-C (Inactive). The care management team was consulted for assistance with disease management and care coordination needs.    Engaged with patient by telephone for follow up visit in response to provider referral for case management and care coordination services.   Consent to Services:  The patient was given information about Chronic Care Management services, agreed to services, and gave verbal consent prior to initiation of services.  Please see initial visit note for detailed documentation.   Assessment: Review of patient past medical history, allergies, medications, health status, including review of consultants reports, laboratory and other test data, was performed as part of comprehensive evaluation and provision of chronic care management services.   SDOH (Social Determinants of Health) assessments and interventions performed: No  CCM Care Plan  Allergies  Allergen Reactions   Codeine Shortness Of Breath    Outpatient Encounter Medications as of 01/29/2021  Medication Sig   albuterol (VENTOLIN HFA) 108 (90 Base) MCG/ACT inhaler Inhale 2 puffs into the lungs every 4 (four) hours as needed for wheezing or shortness of breath.   ANDROGEL PUMP 20.25 MG/ACT (1.62%) GEL Apply 1 application topically daily.    bisoprolol (ZEBETA) 5 MG tablet Take 0.5 tablet (2.5 mg) by mouth once daily   blood glucose meter kit and supplies Use 1-2 times a day as needed.Dispense based on patient and insurance preference. (FOR ICD-10 E10.9, E11.9).   budesonide-formoterol (SYMBICORT) 80-4.5 MCG/ACT inhaler SMARTSIG:2 Puff(s) By Mouth Twice Daily   FARXIGA 5 MG TABS tablet TAKE 1 TABLET(5 MG) BY MOUTH DAILY BEFORE AND BREAKFAST   FARXIGA 5 MG TABS tablet TAKE 1 TABLET BY MOUTH DAILY BEFORE  BREAKFAST   glucosamine-chondroitin 500-400 MG tablet Take 2 tablets by mouth daily.   glucose blood (ONETOUCH VERIO) test strip USE TO CHECK BLOOD SUGAR EVERY DAY   Lancets (ONETOUCH DELICA PLUS [id number]) MISC 1 each by Does not apply route daily.   Lancets (ONETOUCH ULTRASOFT) lancets Use with testing once daily and as neededUse as instructed   nystatin ointment (MYCOSTATIN) Apply 1 application topically 2 (two) times daily.   PARoxetine (PAXIL) 40 MG tablet TAKE 1 TABLET(40 MG) BY MOUTH DAILY   potassium chloride SA (KLOR-CON) 20 MEQ tablet TAKE 2 TABLETS(40 MEQ) BY MOUTH TWICE DAILY   spironolactone (ALDACTONE) 50 MG tablet Take 1 tablet (50 mg total) by mouth daily.   torsemide (DEMADEX) 20 MG tablet TAKE 3 TABLETS(60 MG) BY MOUTH DAILY   No facility-administered encounter medications on file as of 01/29/2021.    Patient Active Problem List   Diagnosis Date Noted   Fungal rash of right side face.  05/19/2020   Fall from slipping on ice 05/19/2020   Pain due to onychomycosis of toenails of both feet 03/13/2020   Diabetes mellitus without complication ([place]) 44/31/5400   History of bradycardia 02/13/2020   COPD exacerbation ([place]) 12/30/2019   Acute on chronic respiratory failure with hypoxia ([place]) 12/30/2019   Depression 12/30/2019   Acute renal failure superimposed on stage 2 chronic kidney disease ([place]) 12/30/2019   Chronic diastolic heart failure ([place]) 02/16/2018   Accelerated hypertension 86/76/1950   Acute diastolic heart failure ([place])    Kyphoscoliosis and scoliosis 01/30/2018   Erectile dysfunction 09/09/2016   Disorder of bursae of shoulder region 09/07/2016  Impingement syndrome of shoulder region 09/07/2016   Anxiety 09/11/2014   Airway hyperreactivity 09/11/2014   CN (constipation) 09/11/2014   Alopecia, male pattern 09/11/2014   Decreased libido 09/11/2014   Dysfunction of eustachian tube 09/11/2014   Groin strain 09/11/2014   Cephalalgia 24/23/5361   Umbilical  hernia 44/31/5400   Essential hypertension 09/11/2014   Migraine 09/11/2014   Neoplasm of uncertain behavior 86/76/1950   Hernia of anterior abdominal wall 09/11/2014   Contact dermatitis due to Genus Toxicodendron 09/11/2014   Open-angle glaucoma 05/20/2009   Congenital hydrocephalus ([place]) 05/20/2009   Klippel-Feil syndrome 05/20/2009   Enteritis presumed infectious 06/11/2008   HLD (hyperlipidemia) 03/15/2008   Clinical depression 06/13/2007     Conditions to be addressed/monitored:DMII Patient Care Plan: Diabetes Type 2 (Adult)     Problem Identified: Glycemic Management (Diabetes, Type 2)      Long-Range Goal: Glycemic Management Optimized   Start Date: 01/15/2021  Expected End Date: 04/15/2021  Priority: Medium  Note:    Current Barriers:  Chronic Disease Management support and educational  support related to Diabetes.  Case Manager Clinical Goal(s):  Over the next 90 days, patient will demonstrate improved adherence to prescribed treatment plan for diabetes self care/management as evidenced by taking medications as prescribed, daily monitoring and recording of CBG's and adhering to an ADA/ carb modified diet.  Interventions:  Collaboration with [name], [name], PA-C (Inactive) regarding development and update of comprehensive plan of care as evidenced by provider attestation and co-signature Inter-disciplinary care team collaboration (see longitudinal plan of care) Reviewed medications. Reports taking medications as prescribed. He started Ozempic earlier this month and hoping to see improvements with his A1C with consistent use. Provided information regarding importance of consistent blood glucose monitoring. Reports fasting readings have been slightly elevated. Readings this week ranged from the 140's to 150. He reports significant improvements with monitoring carbs and dietary intake. Attributes some of the elevations to stress r/t life changes.  Encouraged to continue  monitoring and maintain a log. Reviewed s/sx of hypoglycemia and hyperglycemia along with recommended interventions. Discussed and offered referrals for available Diabetes education classes. Declined need for additional educational resources. Will reconsider if his A1C does not improve with Ozempic. Discussed importance of completing recommended DM preventive care. He continues to experience neuropathy. Reports completing daily foot care as advised. His eye exam is up to date/completed approximately three months ago. Update on 01/29/21. Reports some fluctuations with readings but reports overall readings have improved. He expressed concerns regarding prescription refills d/t recent changes with his PCP. Contacted central scheduling to schedule a clinic visit. Agreed to update the care management team if additional assistance is required.   Patient Goals/Self-Care Activities Self-administer medications as prescribed Attend all scheduled provider appointments Monitor blood glucose levels consistently and utilize recommended interventions Adhere to prescribed ADA/carb modified  Notify provider or care management team with questions and new concerns as needed    Follow Up Plan:  Will follow up next month    Patient Care Plan: COPD (Adult)     Problem Identified: Symptom Exacerbation (COPD)      Long-Range Goal: Symptom Exacerbation Prevented or Minimized   Start Date: 01/15/2021  Expected End Date: 04/15/2021  Priority: High  Note:   Current Barriers:  Chronic Disease Management support and educational needs related to COPD.  Case Manager Clinical Goal(s): Over the next 90 days, patient will not require hospitalization due to complications related to COPD exacerbation.  Interventions:  Collaboration with [name], [name],  PA-C (Inactive) regarding development and update of comprehensive plan of care as evidenced by provider attestation and co-signature Inter-disciplinary care team  collaboration (see longitudinal plan of care) Provided verbal COPD education on self care/management/and exacerbation prevention. Encouraged to take needed precautions and avoid extreme temperatures and avoid overexertion to prevent exacerbation. Discussed current action plan and reinforced importance of daily self assessment. Reports using supplemental oxygen when needed. Denies cough, congestion or shortness of breath at rest. Advised to make an appointment with his provider if experiencing symptoms for 48 hours without improvement.  Reviewed worsening symptoms that require immediate medical attention.  Provided information regarding infection prevention and increased risk r/t COPD.  Advised to utilize  prevention strategies to reduce risk of respiratory infection. Update on 01/29/21:  Expressed concerns regarding ability to obtain needed medication refills. Reports symptoms related to COPD and anxiety are currently controlled. Wants to ensure medications including Paxil can be refilled by his new PCP. Central scheduling was contacted. He will be evaluated in the clinic for medication review.   Self-Care Activities/Patient Goals: Take medications and utilize inhalers as prescribed Assess symptoms daily and notify provider if experiencing symptoms for 48 hrs without improvement Avoid extreme temperatures and strenuous activites Follow recommendations to prevent respiratory infection Notify provider or care management team with questions and new concerns as needed   Follow Up Plan:  Will follow up next month      PLAN: A member of the care management team will follow up within the next month.   Cristy Friedlander [facility] Care Management [facility] [phone]

## 2021-01-30 ENCOUNTER — Ambulatory Visit: Payer: Self-pay

## 2021-01-30 DIAGNOSIS — I5032 Chronic diastolic (congestive) heart failure: Secondary | ICD-10-CM

## 2021-01-30 DIAGNOSIS — J441 Chronic obstructive pulmonary disease with (acute) exacerbation: Secondary | ICD-10-CM

## 2021-01-30 DIAGNOSIS — E119 Type 2 diabetes mellitus without complications: Secondary | ICD-10-CM

## 2021-01-30 NOTE — Patient Instructions (Addendum)
Thank you for allowing the Chronic Care Management team to participate in your care.    Patient Care Plan: COPD (Adult)     Problem Identified: Symptom Exacerbation (COPD)      Long-Range Goal: Symptom Exacerbation Prevented or Minimized   Start Date: 01/15/2021  Expected End Date: 04/15/2021  Priority: High  Note:   Current Barriers:  Chronic Disease Management support and educational needs related to COPD.  Case Manager Clinical Goal(s): Over the next 90 days, patient will not require hospitalization due to complications related to COPD exacerbation.  Interventions:  Collaboration with [name], [name], PA-C (Inactive) regarding development and update of comprehensive plan of care as evidenced by provider attestation and co-signature Inter-disciplinary care team collaboration (see longitudinal plan of care) Provided verbal COPD education on self care/management/and exacerbation prevention. Encouraged to take needed precautions and avoid extreme temperatures and avoid overexertion to prevent exacerbation. Discussed current action plan and reinforced importance of daily self assessment. Reports using supplemental oxygen when needed. Denies cough, congestion or shortness of breath at rest. Advised to make an appointment with his provider if experiencing symptoms for 48 hours without improvement.  Reviewed worsening symptoms that require immediate medical attention.  Provided information regarding infection prevention and increased risk r/t COPD.  Advised to utilize  prevention strategies to reduce risk of respiratory infection. Update on 01/30/21:  Follow up coordination r/t medication refills and appointment schedule. Appointment scheduled for 02/05/2021. Patient reports having needed transportation for the appointment. Will update the care management team if additional assistance is required.  Self-Care Activities/Patient Goals: Take medications and utilize inhalers as  prescribed Assess symptoms daily and notify provider if experiencing symptoms for 48 hrs without improvement Avoid extreme temperatures and strenuous activites Follow recommendations to prevent respiratory infection Notify provider or care management team with questions and new concerns as needed   Follow Up Plan:  Will follow up next month       Scott [name] verbalized understanding of the telephonic outreach. Declined need for mailed/printed instructions. A member of the care management team will follow up next month.   [facility] [phone]

## 2021-01-30 NOTE — Chronic Care Management (AMB) (Signed)
Chronic Care Management   CCM RN Visit Note  01/30/2021 Name: Scott [name] MRN: [id number] DOB: 1971-03-25  Subjective: Scott [name] is a 50 y.o. year old male who is a primary care patient of Caryn Section, Kirstie Peri, MD. The care management team was consulted for assistance with disease management and care coordination needs.    Engaged with patient by telephone for follow up visit in response to provider referral for case management and care coordination services.   Consent to Services:  The patient was given information about Chronic Care Management services, agreed to services, and gave verbal consent prior to initiation of services.  Please see initial visit note for detailed documentation.    Assessment: Review of patient past medical history, allergies, medications, health status, including review of consultants reports, laboratory and other test data, was performed as part of comprehensive evaluation and provision of chronic care management services.   SDOH (Social Determinants of Health) assessments and interventions performed:  No  CCM Care Plan  Allergies  Allergen Reactions   Codeine Shortness Of Breath    Outpatient Encounter Medications as of 01/30/2021  Medication Sig   albuterol (VENTOLIN HFA) 108 (90 Base) MCG/ACT inhaler Inhale 2 puffs into the lungs every 4 (four) hours as needed for wheezing or shortness of breath.   ANDROGEL PUMP 20.25 MG/ACT (1.62%) GEL Apply 1 application topically daily.    bisoprolol (ZEBETA) 5 MG tablet Take 0.5 tablet (2.5 mg) by mouth once daily   blood glucose meter kit and supplies Use 1-2 times a day as needed.Dispense based on patient and insurance preference. (FOR ICD-10 E10.9, E11.9).   budesonide-formoterol (SYMBICORT) 80-4.5 MCG/ACT inhaler SMARTSIG:2 Puff(s) By Mouth Twice Daily   FARXIGA 5 MG TABS tablet TAKE 1 TABLET(5 MG) BY MOUTH DAILY BEFORE AND BREAKFAST   FARXIGA 5 MG TABS tablet TAKE 1 TABLET BY MOUTH DAILY BEFORE BREAKFAST    glucosamine-chondroitin 500-400 MG tablet Take 2 tablets by mouth daily.   glucose blood (ONETOUCH VERIO) test strip USE TO CHECK BLOOD SUGAR EVERY DAY   Lancets (ONETOUCH DELICA PLUS [id number]) MISC 1 each by Does not apply route daily.   Lancets (ONETOUCH ULTRASOFT) lancets Use with testing once daily and as neededUse as instructed   nystatin ointment (MYCOSTATIN) Apply 1 application topically 2 (two) times daily.   PARoxetine (PAXIL) 40 MG tablet TAKE 1 TABLET(40 MG) BY MOUTH DAILY   potassium chloride SA (KLOR-CON) 20 MEQ tablet TAKE 2 TABLETS(40 MEQ) BY MOUTH TWICE DAILY   spironolactone (ALDACTONE) 50 MG tablet Take 1 tablet (50 mg total) by mouth daily.   torsemide (DEMADEX) 20 MG tablet TAKE 3 TABLETS(60 MG) BY MOUTH DAILY   No facility-administered encounter medications on file as of 01/30/2021.    Patient Active Problem List   Diagnosis Date Noted   Fungal rash of right side face.  05/19/2020   Fall from slipping on ice 05/19/2020   Pain due to onychomycosis of toenails of both feet 03/13/2020   Diabetes mellitus without complication ([place]) 65/53/7482   History of bradycardia 02/13/2020   COPD exacerbation ([place]) 12/30/2019   Acute on chronic respiratory failure with hypoxia ([place]) 12/30/2019   Depression 12/30/2019   Acute renal failure superimposed on stage 2 chronic kidney disease ([place]) 12/30/2019   Chronic diastolic heart failure ([place]) 02/16/2018   Accelerated hypertension 70/78/6754   Acute diastolic heart failure ([place])    Kyphoscoliosis and scoliosis 01/30/2018   Erectile dysfunction 09/09/2016   Disorder of bursae of shoulder region  09/07/2016   Impingement syndrome of shoulder region 09/07/2016   Anxiety 09/11/2014   Airway hyperreactivity 09/11/2014   CN (constipation) 09/11/2014   Alopecia, male pattern 09/11/2014   Decreased libido 09/11/2014   Dysfunction of eustachian tube 09/11/2014   Groin strain 09/11/2014   Cephalalgia 02/54/2706   Umbilical hernia  23/76/2831   Essential hypertension 09/11/2014   Migraine 09/11/2014   Neoplasm of uncertain behavior 51/76/1607   Hernia of anterior abdominal wall 09/11/2014   Contact dermatitis due to Genus Toxicodendron 09/11/2014   Open-angle glaucoma 05/20/2009   Congenital hydrocephalus ([place]) 05/20/2009   Klippel-Feil syndrome 05/20/2009   Enteritis presumed infectious 06/11/2008   HLD (hyperlipidemia) 03/15/2008   Clinical depression 06/13/2007    Conditions to be addressed/monitored:COPD  Patient Care Plan: COPD (Adult)     Problem Identified: Symptom Exacerbation (COPD)      Long-Range Goal: Symptom Exacerbation Prevented or Minimized   Start Date: 01/15/2021  Expected End Date: 04/15/2021  Priority: High  Note:   Current Barriers:  Chronic Disease Management support and educational needs related to COPD.  Case Manager Clinical Goal(s): Over the next 90 days, patient will not require hospitalization due to complications related to COPD exacerbation.  Interventions:  Collaboration with [name], [name], PA-C (Inactive) regarding development and update of comprehensive plan of care as evidenced by provider attestation and co-signature Inter-disciplinary care team collaboration (see longitudinal plan of care) Provided verbal COPD education on self care/management/and exacerbation prevention. Encouraged to take needed precautions and avoid extreme temperatures and avoid overexertion to prevent exacerbation. Discussed current action plan and reinforced importance of daily self assessment. Reports using supplemental oxygen when needed. Denies cough, congestion or shortness of breath at rest. Advised to make an appointment with his provider if experiencing symptoms for 48 hours without improvement.  Reviewed worsening symptoms that require immediate medical attention.  Provided information regarding infection prevention and increased risk r/t COPD.  Advised to utilize  prevention  strategies to reduce risk of respiratory infection. Update on 01/30/21:  Follow up coordination r/t medication refills and appointment schedule. Appointment scheduled for 02/05/2021. Patient reports having needed transportation for the appointment. Will update the care management team if additional assistance is required.  Self-Care Activities/Patient Goals: Take medications and utilize inhalers as prescribed Assess symptoms daily and notify provider if experiencing symptoms for 48 hrs without improvement Avoid extreme temperatures and strenuous activites Follow recommendations to prevent respiratory infection Notify provider or care management team with questions and new concerns as needed   Follow Up Plan:  Will follow up next month       PLAN A member of the care management team will follow up next month.   [facility] [phone]

## 2021-01-31 NOTE — Progress Notes (Signed)
Cardiology Office Note    Date:  02/03/2021   ID:  Scott [name], DOB 11-Jul-1970, MRN [id number]  PCP:  Birdie Sons, MD  Cardiologist:  Ida Rogue, MD  Electrophysiologist:  None   Chief Complaint: Follow-up  History of Present Illness:   Scott [name] is a 50 y.o. male with history of Klippel-File syndrome, chronic hypoxic respiratory failure secondary to severe restrictive lung disease, airway hyperactivity, and COPD, HFpEF, HTN, HLD, asthma, alopecia, migraine disorder, open-angle glaucoma, depression, arthritis, anxiety, obesity, kyphoscoliosis, and GERD who presents for follow-up of HFpEF.   He was seen in the ED in 02/2018 for dyspnea and subsequently diagnosed with HFpEF. Echo in 02/2018 showed an EF of 55-60%. Lexiscan MPI in 02/2018 showed no evidence of ischemia. Sleep study in 03/2018 showed no evidence of sleep apnea. Pulmonary function testing has shown severe restrictive lung disease. He has previously been established with pulmonology, though was dismissed from their practice for multiple no shows, last seeing them in 01/2018. He was previously followed by Dr. Fletcher Anon, though has subsequently transitioned to Dr. Rockey Situ. He is also followed by the [facility].  He has been seen numerous times for complaints of ongoing dyspnea and reported weight gain leading to adjustment of his diuretic regimen.  He has been advised multiple times to reestablish with pulmonology.  It has been felt his dyspnea is multifactorial including severe restrictive lung disease, underlying COPD, kyphoscoliosis, obesity, physical deconditioning, and diastolic dysfunction.  He was admitted to the hospital in 12/2019 for COPD exacerbation.  D-dimer was normal.  Lower extremity ultrasound was negative for DVT bilaterally.  Most recent echo from 02/2020 showed an EF of 60 to 65%, no regional wall motion abnormalities, normal LV diastolic function parameters, normal RV systolic function and ventricular  cavity size, no significant valvular abnormalities, and an estimated right atrial pressure of 8 mmHg.  He was last seen in the office in 06/2020 and was consuming high volume liquids including soda and Gatorade.  His weight was down 3 pounds when compared to his visit in 02/2020.  It was recommended he cut back on his fluid intake and continue torsemide 60 mg daily.  He comes in doing well from a cardiac perspective.  His weight remains stable and at baseline.  He is now followed by pulmonology at [facility].  He remains on torsemide 60 mg daily.  Recent labs from 12/2020 showed stable renal function and potassium as outlined below.  No chest pain, palpitations, dizziness, presyncope, or syncope.  No lower extremity swelling, abdominal distention, orthopnea, PND, or early satiety.  He watches his salt and fluid intake.  He does not have any active cardiac issues or concerns at this time.   Labs independently reviewed: 12/2020 - A1c 8.3, BUN 19, serum creatinine 1.25, potassium 4.9, albumin 4.3, AST/ALT normal 08/2020 - TSH normal, TC 211, TG 115, HDL 75, LDL 116, Hgb 13.5, PLT 331    Past Medical History:  Diagnosis Date   Acid reflux    Anxiety    Arthritis    Asthma    CHF (congestive heart failure) (HCC)    Depression    Glaucoma    Hyperlipidemia    Hypertension    Klippel-Feil syndrome     Past Surgical History:  Procedure Laterality Date   COLOSTOMY     [place] SHUNT PLACEMENT      Current Medications: Current Meds  Medication Sig   albuterol (VENTOLIN HFA) 108 (90 Base) MCG/ACT inhaler  Inhale 2 puffs into the lungs every 4 (four) hours as needed for wheezing or shortness of breath.   ANDROGEL PUMP 20.25 MG/ACT (1.62%) GEL Apply 1 application topically daily.    blood glucose meter kit and supplies Use 1-2 times a day as needed.Dispense based on patient and insurance preference. (FOR ICD-10 E10.9, E11.9).   budesonide-formoterol (SYMBICORT) 80-4.5 MCG/ACT inhaler SMARTSIG:2 Puff(s) By  Mouth Twice Daily   dapagliflozin propanediol (FARXIGA) 10 MG TABS tablet Take 10 mg by mouth daily.   glucose blood (ONETOUCH VERIO) test strip USE TO CHECK BLOOD SUGAR EVERY DAY   Lancets (ONETOUCH DELICA PLUS [id number]) MISC 1 each by Does not apply route daily.   Lancets (ONETOUCH ULTRASOFT) lancets Use with testing once daily and as neededUse as instructed   latanoprost (XALATAN) 0.005 % ophthalmic solution Place 1 drop into both eyes at bedtime.   nystatin ointment (MYCOSTATIN) Apply 1 application topically 2 (two) times daily.   PARoxetine (PAXIL) 40 MG tablet TAKE 1 TABLET(40 MG) BY MOUTH DAILY   potassium chloride SA (KLOR-CON) 20 MEQ tablet TAKE 2 TABLETS(40 MEQ) BY MOUTH TWICE DAILY   spironolactone (ALDACTONE) 50 MG tablet Take 1 tablet (50 mg total) by mouth daily.   torsemide (DEMADEX) 20 MG tablet TAKE 3 TABLETS(60 MG) BY MOUTH DAILY   [DISCONTINUED] bisoprolol (ZEBETA) 5 MG tablet Take 0.5 tablet (2.5 mg) by mouth once daily    Allergies:   Codeine   Social History   Socioeconomic History   Marital status: Significant Other    Spouse name: Not on file   Number of children: 0   Years of education: Not on file   Highest education level: Some college, no degree  Occupational History   Occupation: disability  Tobacco Use   Smoking status: Former    Packs/day: 1.00    Years: 10.00    Pack years: 10.00    Types: Cigarettes    Quit date: 05/2006    Years since quitting: 14.7   Smokeless tobacco: Never   Tobacco comments:    quit 05/2006  Vaping Use   Vaping Use: Former   Devices: tried once  Substance and Sexual Activity   Alcohol use: No    Alcohol/week: 0.0 standard drinks   Drug use: No   Sexual activity: Not on file  Other Topics Concern   Not on file  Social History Narrative   Not on file   Social Determinants of Health   Financial Resource Strain: High Risk   Difficulty of Paying Living Expenses: Very hard  Food Insecurity: Not on file   Transportation Needs: No Transportation Needs   Lack of Transportation (Medical): No   Lack of Transportation (Non-Medical): No  Physical Activity: Inactive   Days of Exercise per Week: 0 days   Minutes of Exercise per Session: 0 min  Stress: Stress Concern Present   Feeling of Stress : Rather much  Social Connections: Moderately Isolated   Frequency of Communication with Friends and Family: More than three times a week   Frequency of Social Gatherings with Friends and Family: More than three times a week   Attends Religious Services: Never   Marine scientist or Organizations: Yes   Attends Music therapist: More than 4 times per year   Marital Status: Never married     Family History:  The patient's family history includes Cancer in his paternal grandfather; Colon cancer in his father; Diabetes in his sister; Heart disease in his father;  Lung cancer in his maternal grandmother; Multiple sclerosis in his mother; Ovarian cancer in his paternal grandmother; Thyroid cancer in his maternal grandfather.  ROS:   12-point review of systems is negative unless otherwise noted in the HPI.   EKGs/Labs/Other Studies Reviewed:    Studies reviewed were summarized above. The additional studies were reviewed today:  2D echo 02/2020: 1. Left ventricular ejection fraction, by estimation, is 60 to 65%. The  left ventricle has normal function. The left ventricle has no regional  wall motion abnormalities. Left ventricular diastolic parameters were  normal.   2. Right ventricular systolic function is normal. The right ventricular  size is normal.   3. The mitral valve is normal in structure. No evidence of mitral valve  regurgitation. No evidence of mitral stenosis.   4. The aortic valve is normal in structure. Aortic valve regurgitation is  not visualized. No aortic stenosis is present.   5. The inferior vena cava is dilated in size with >50% respiratory  variability,  suggesting right atrial pressure of 8 mmHg.  __________  2D echo 02/2018: - Left ventricle: The cavity size was normal. Wall thickness was    normal. Systolic function was normal. The estimated ejection    fraction was in the range of 55% to 60%. Wall motion was normal;    there were no regional wall motion abnormalities. Doppler    parameters are consistent with abnormal left ventricular    relaxation (grade 1 diastolic dysfunction).  - Left atrium: The atrium was normal in size.  - Right ventricle: Systolic function was normal.  - Pulmonary arteries: Systolic pressure could not be accurately    estimated.  ___________   Carlton Adam MPI 02/2018: Pharmacological myocardial perfusion imaging study with no significant  Ischemia Small region of fixed apical defect, likely attenuation artifact. Normal wall motion, EF estimated at 35% (depressed secondary to GI uptake artifact) No EKG changes concerning for ischemia at peak stress or in recovery. Low risk scan, normal EF on echocardiogram, >55%   EKG:  EKG is ordered today.  The EKG ordered today demonstrates NSR, 77 bpm, poor R wave progression along the precordial leads, no acute ST-T changes  Recent Labs: 08/15/2020: ALT 20; BUN 16; Creatinine, Ser 1.09; Hemoglobin 13.5; Platelets 331; Potassium 4.6; Sodium 137; TSH 1.400  Recent Lipid Panel    Component Value Date/Time   CHOL 211 (H) 08/15/2020 1033   TRIG 115 08/15/2020 1033   HDL 75 08/15/2020 1033   CHOLHDL 3.3 07/01/2020 1318   VLDL 29 07/01/2020 1318   LDLCALC 116 (H) 08/15/2020 1033    PHYSICAL EXAM:    VS:  BP 110/80 (BP Location: Right Arm, Patient Position: Sitting, Cuff Size: Large)   Pulse 77   Ht '5\' 2"'  (1.575 m)   Wt 210 lb (95.3 kg)   SpO2 96% Comment: on 3L O2  BMI 38.41 kg/m   BMI: Body mass index is 38.41 kg/m.  Physical Exam Vitals reviewed.  Constitutional:      Appearance: He is well-developed.  HENT:     Head: Normocephalic and atraumatic.  Eyes:      General:        Right eye: No discharge.        Left eye: No discharge.  Neck:     Vascular: No JVD.  Cardiovascular:     Rate and Rhythm: Normal rate and regular rhythm.     Pulses:          Posterior tibial pulses  are 2+ on the right side and 2+ on the left side.     Heart sounds: Normal heart sounds, S1 normal and S2 normal. Heart sounds not distant. No midsystolic click and no opening snap. No murmur heard.   No friction rub.  Pulmonary:     Effort: Pulmonary effort is normal. No respiratory distress.     Breath sounds: Normal breath sounds. No decreased breath sounds, wheezing or rales.     Comments: Supplemental oxygen noted via nasal cannula Chest:     Chest wall: No tenderness.  Abdominal:     General: There is no distension.     Palpations: Abdomen is soft.     Tenderness: There is no abdominal tenderness.  Musculoskeletal:     Cervical back: Normal range of motion.     Right lower leg: No edema.     Left lower leg: No edema.  Skin:    General: Skin is warm and dry.     Nails: There is no clubbing.  Neurological:     Mental Status: He is alert and oriented to person, place, and time.  Psychiatric:        Speech: Speech normal.        Behavior: Behavior normal.        Thought Content: Thought content normal.        Judgment: Judgment normal.    Wt Readings from Last 3 Encounters:  02/03/21 210 lb (95.3 kg)  08/12/20 213 lb (96.6 kg)  06/30/20 209 lb (94.8 kg)     ASSESSMENT & PLAN:   Chronic hypoxic respiratory failure: Stable.  Remains on supplemental oxygen which is followed by pulmonology.  HFpEF: He appears euvolemic and well compensated.  He remains on torsemide 60 mg daily along with spironolactone and SGLT2i.  Labs stable.  Obesity: Weight loss is encouraged to heart healthy diet and regular exercise.  He has been prescribed Ozempic, though has not yet started.  Restrictive lung disease/COPD: Followed by [facility] pulmonology.  DM2: Followed by PCP.     Disposition: F/u with Dr. Rockey Situ or an APP in 6 months.   Medication Adjustments/Labs and Tests Ordered: Current medicines are reviewed at length with the patient today.  Concerns regarding medicines are outlined above. Medication changes, Labs and Tests ordered today are summarized above and listed in the Patient Instructions accessible in Encounters.   Signed, Christell Faith, PA-C 02/03/2021 4:07 PM     [place], [place] [address] [phone]

## 2021-02-02 DIAGNOSIS — J441 Chronic obstructive pulmonary disease with (acute) exacerbation: Secondary | ICD-10-CM | POA: Diagnosis not present

## 2021-02-02 DIAGNOSIS — I5032 Chronic diastolic (congestive) heart failure: Secondary | ICD-10-CM

## 2021-02-02 DIAGNOSIS — E119 Type 2 diabetes mellitus without complications: Secondary | ICD-10-CM | POA: Diagnosis not present

## 2021-02-02 DIAGNOSIS — E1169 Type 2 diabetes mellitus with other specified complication: Secondary | ICD-10-CM | POA: Diagnosis not present

## 2021-02-03 ENCOUNTER — Other Ambulatory Visit: Payer: Self-pay

## 2021-02-03 ENCOUNTER — Encounter: Payer: Self-pay | Admitting: Physician Assistant

## 2021-02-03 ENCOUNTER — Ambulatory Visit (INDEPENDENT_AMBULATORY_CARE_PROVIDER_SITE_OTHER): Payer: Medicare Other | Admitting: Physician Assistant

## 2021-02-03 VITALS — BP 110/80 | HR 77 | Ht 62.0 in | Wt 210.0 lb

## 2021-02-03 DIAGNOSIS — J9611 Chronic respiratory failure with hypoxia: Secondary | ICD-10-CM

## 2021-02-03 DIAGNOSIS — J449 Chronic obstructive pulmonary disease, unspecified: Secondary | ICD-10-CM

## 2021-02-03 DIAGNOSIS — I5032 Chronic diastolic (congestive) heart failure: Secondary | ICD-10-CM

## 2021-02-03 DIAGNOSIS — J984 Other disorders of lung: Secondary | ICD-10-CM

## 2021-02-03 DIAGNOSIS — Z6838 Body mass index (BMI) 38.0-38.9, adult: Secondary | ICD-10-CM | POA: Diagnosis not present

## 2021-02-03 MED ORDER — BISOPROLOL FUMARATE 5 MG PO TABS: ORAL_TABLET | ORAL | 3 refills | Status: DC

## 2021-02-03 NOTE — Patient Instructions (Signed)
Medication Instructions:  No changes at this time.  *If you need a refill on your cardiac medications before your next appointment, please call your pharmacy*   Lab Work: None  If you have labs (blood work) drawn today and your tests are completely normal, you will receive your results only by: [place] (if you have MyChart) OR A paper copy in the mail If you have any lab test that is abnormal or we need to change your treatment, we will call you to review the results.   Testing/Procedures: None   Follow-Up: At [facility], you and your health needs are our priority.  As part of our continuing mission to provide you with exceptional heart care, we have created designated Provider Care Teams.  These Care Teams include your primary Cardiologist (physician) and Advanced Practice Providers (APPs -  Physician Assistants and Nurse Practitioners) who all work together to provide you with the care you need, when you need it.   Your next appointment:   6 month(s)  The format for your next appointment:   In Person  Provider:   You may see Ida Rogue, MD or one of the following Advanced Practice Providers on your designated Care Team:   Murray Hodgkins, NP Christell Faith, PA-C Marrianne Mood, PA-C [place], [place]

## 2021-02-04 ENCOUNTER — Telehealth: Payer: Self-pay | Admitting: *Deleted

## 2021-02-04 NOTE — Telephone Encounter (Signed)
   Telephone encounter was:  Unsuccessful.  02/04/2021 Name: MUATH HALLAM MRN: [id number] DOB: 1970-11-27  Unsuccessful outbound call made today to assist with:  Will send me a copy of outstanding utility bill and will submit to [employer] Attempt:  2nd Attempt  A HIPAA compliant voice message was left requesting a return call.  Instructed patient to call back at   Instructed patient to call back at [phone]  at their earliest convenience. .  [facility], Care Management  [phone] [address] , [place] [address] Email : [email or web address]

## 2021-02-06 ENCOUNTER — Telehealth: Payer: Self-pay | Admitting: *Deleted

## 2021-02-06 NOTE — Telephone Encounter (Signed)
   Telephone encounter was:  Successful.  02/06/2021 Name: Scott [name] MRN: [id number] DOB: Sep 15, 1970  Scott [name] is a 50 y.o. year old male who is a primary care patient of Caryn Section, Kirstie Peri, MD . The community resource team was consulted for assistance with Called patient back as had not recieved his electric statement through email but he resent it so going to commplete application for [place] guide performed the following interventions: Patient provided with information about care guide support team and interviewed to confirm resource needs Follow up call placed to the patient to discuss status of referral.  Follow Up Plan:  Care guide will follow up with patient by phone over the next days  [place], Care Management  [phone] [address] , [place] [address] Email : [email or web address]

## 2021-02-10 ENCOUNTER — Telehealth: Payer: Self-pay | Admitting: *Deleted

## 2021-02-10 NOTE — Telephone Encounter (Signed)
   Telephone encounter was:  Successful.  02/10/2021 Name: Scott [name] MRN: [id number] DOB: 06-06-1970  Scott [name] is a 50 y.o. year old male who is a primary care patient of Fisher, Kirstie Peri, MD . The community resource team was consulted for assistance with Transportation Needs  and [place] guide performed the following interventions: Patient provided with information about care guide support team and interviewed to confirm resource needs Follow up call placed to community resources to determine status of patients referral.  Follow Up Plan:  No further follow up planned at this time. The patient has been provided with needed resources.  [place], Care Management  [phone] [address] , [place] [address] Email : [email or web address] [email or web address]

## 2021-02-20 ENCOUNTER — Ambulatory Visit: Payer: Self-pay | Admitting: *Deleted

## 2021-02-20 ENCOUNTER — Ambulatory Visit: Payer: Medicare Other | Admitting: Family Medicine

## 2021-02-20 NOTE — Telephone Encounter (Signed)
Reason for Disposition  [1] MODERATE longstanding difficulty breathing (e.g., speaks in phrases, SOB even at rest, pulse 100-120) AND [2] SAME as normal  Answer Assessment - Initial Assessment Questions 1. RESPIRATORY STATUS: "Describe your breathing?" (e.g., wheezing, shortness of breath, unable to speak, severe coughing)     Reports no change in breathing and has chronic issues with breathing  2. ONSET: "When did this breathing problem begin?"      na 3. PATTERN "Does the difficult breathing come and go, or has it been constant since it started?"      na 4. SEVERITY: "How bad is your breathing?" (e.g., mild, moderate, severe)    - MILD: No SOB at rest, mild SOB with walking, speaks normally in sentences, can lie down, no retractions, pulse < 100.    - MODERATE: SOB at rest, SOB with minimal exertion and prefers to sit, cannot lie down flat, speaks in phrases, mild retractions, audible wheezing, pulse 100-120.    - SEVERE: Very SOB at rest, speaks in single words, struggling to breathe, sitting hunched forward, retractions, pulse > 120      na 5. RECURRENT SYMPTOM: "Have you had difficulty breathing before?" If Yes, ask: "When was the last time?" and "What happened that time?"      Yes chronic issues with breathing  6. CARDIAC HISTORY: "Do you have any history of heart disease?" (e.g., heart attack, angina, bypass surgery, angioplasty)      na 7. LUNG HISTORY: "Do you have any history of lung disease?"  (e.g., pulmonary embolus, asthma, emphysema)     Chronic issues  8. CAUSE: "What do you think is causing the breathing problem?"      na 9. OTHER SYMPTOMS: "Do you have any other symptoms? (e.g., dizziness, runny nose, cough, chest pain, fever)     na 10. O2 SATURATION MONITOR:  "Do you use an oxygen saturation monitor (pulse oximeter) at home?" If Yes, "What is your reading (oxygen level) today?" "What is your usual oxygen saturation reading?" (e.g., 95%)       na 11. PREGNANCY: "Is there  any chance you are pregnant?" "When was your last menstrual period?"       na 12. TRAVEL: "Have you traveled out of the country in the last month?" (e.g., travel history, exposures)       na  Protocols used: Breathing Difficulty-A-AH

## 2021-02-20 NOTE — Progress Notes (Deleted)
Established patient visit   Patient: Scott [name]   DOB: Aug 12, 1970   50 y.o. Male  MRN: [id number] Visit Date: 02/20/2021  Today's healthcare provider: Lelon Huh, MD   No chief complaint on file.  Subjective    HPI  Diabetes Mellitus Type II, Follow-up  Lab Results  Component Value Date   HGBA1C 8.3 (H) 08/15/2020   HGBA1C 8.6 (H) 01/24/2020   HGBA1C 5.7 (H) 02/07/2018   Wt Readings from Last 3 Encounters:  02/03/21 210 lb (95.3 kg)  08/12/20 213 lb (96.6 kg)  06/30/20 209 lb (94.8 kg)   Last seen for diabetes 6 months ago.  Management since then includes increasing Farxiga to 10 mg daily. He reports {excellent/good/fair/poor:19665} compliance with treatment. He {is/is not:21021397} having side effects. {document side effects if present:1} Symptoms: {Yes/No:20286} fatigue {Yes/No:20286} foot ulcerations  {Yes/No:20286} appetite changes {Yes/No:20286} nausea  {Yes/No:20286} paresthesia of the feet  {Yes/No:20286} polydipsia  {Yes/No:20286} polyuria {Yes/No:20286} visual disturbances   {Yes/No:20286} vomiting     Home blood sugar records: {diabetes glucometry results:16657}  Episodes of hypoglycemia? {Yes/No:20286} {enter symptoms and frequency of symptoms if yes:1}   Current insulin regiment: none Most Recent Eye Exam: not UTD {Current exercise:16438:::1} {Current diet habits:16563:::1}  Pertinent Labs: Lab Results  Component Value Date   CHOL 211 (H) 08/15/2020   HDL 75 08/15/2020   LDLCALC 116 (H) 08/15/2020   TRIG 115 08/15/2020   CHOLHDL 3.3 07/01/2020   Lab Results  Component Value Date   NA 137 08/15/2020   K 4.6 08/15/2020   CREATININE 1.09 08/15/2020   EGFR 83 08/15/2020     ---------------------------------------------------------------------------------------------------   Hypertension, follow-up  BP Readings from Last 3 Encounters:  02/03/21 110/80  08/12/20 137/89  06/30/20 110/80   Wt Readings from Last 3 Encounters:   02/03/21 210 lb (95.3 kg)  08/12/20 213 lb (96.6 kg)  06/30/20 209 lb (94.8 kg)     He was last seen for hypertension 6 months ago.  BP at that visit was 137/89. Management since that visit includes continuing same medications. Followed by cardiologist (Dr. Rockey Situ) regularly.  He reports {excellent/good/fair/poor:19665} compliance with treatment. He {is/is not:9024} having side effects. {document side effects if present:1} He is following a {diet:21022986} diet. He {is/is not:9024} exercising. He {does/does not:200015} smoke.  Use of agents associated with hypertension: {bp agents assoc with hypertension:511::"none"}.   Outside blood pressures are {***enter patient reported home BP readings, or 'not being checked':1}. Symptoms: {Yes/No:20286} chest pain {Yes/No:20286} chest pressure  {Yes/No:20286} palpitations {Yes/No:20286} syncope  {Yes/No:20286} dyspnea {Yes/No:20286} orthopnea  {Yes/No:20286} paroxysmal nocturnal dyspnea {Yes/No:20286} lower extremity edema   Pertinent labs: Lab Results  Component Value Date   CHOL 211 (H) 08/15/2020   HDL 75 08/15/2020   LDLCALC 116 (H) 08/15/2020   TRIG 115 08/15/2020   CHOLHDL 3.3 07/01/2020   Lab Results  Component Value Date   NA 137 08/15/2020   K 4.6 08/15/2020   CREATININE 1.09 08/15/2020   EGFR 83 08/15/2020   GLUCOSE 141 (H) 08/15/2020   TSH 1.400 08/15/2020     The ASCVD Risk score (Arnett DK, et al., 2019) failed to calculate for the following reasons:   Unable to determine if patient is Non-Hispanic African American   ---------------------------------------------------------------------------------------------------   Depression, Follow-up  He  was last seen for this more than 6 months ago.   Current treatment includes Paroxetine 26m daily.    He reports {excellent/good/fair/poor:19665} compliance with treatment. He {is/is not:21021397} having side effects. ***  He reports {DESC; GOOD/FAIR/POOR:18685}  tolerance of treatment. Current symptoms include: {Symptoms; depression:1002} He feels he is {improved/worse/unchanged:3041574} since last visit.  Depression screen [facility] 2/9 05/16/2020 04/16/2020 04/12/2019  Decreased Interest _0 Down, Depressed, Hopeless _1 PHQ - 2 Score _2 Altered sleeping 3 - 3  Tired, decreased energy 3 - 3  Change in appetite 3 - 3  Feeling bad or failure about yourself  3 - 3  Trouble concentrating 3 - 0  Moving slowly or fidgety/restless 0 - 0  Suicidal thoughts 0 - 0  PHQ-9 Score 20 - 18  Difficult doing work/chores Extremely dIfficult - Very difficult    -----------------------------------------------------------------------------------------   {Link to patient history deactivated due to formatting error:1}  Medications: Outpatient Medications Prior to Visit  Medication Sig   albuterol (VENTOLIN HFA) 108 (90 Base) MCG/ACT inhaler Inhale 2 puffs into the lungs every 4 (four) hours as needed for wheezing or shortness of breath.   ANDROGEL PUMP 20.25 MG/ACT (1.62%) GEL Apply 1 application topically daily.    bisoprolol (ZEBETA) 5 MG tablet Take 0.5 tablet (2.5 mg) by mouth once daily   blood glucose meter kit and supplies Use 1-2 times a day as needed.Dispense based on patient and insurance preference. (FOR ICD-10 E10.9, E11.9).   budesonide-formoterol (SYMBICORT) 80-4.5 MCG/ACT inhaler SMARTSIG:2 Puff(s) By Mouth Twice Daily   dapagliflozin propanediol (FARXIGA) 10 MG TABS tablet Take 10 mg by mouth daily.   glucose blood (ONETOUCH VERIO) test strip USE TO CHECK BLOOD SUGAR EVERY DAY   Lancets (ONETOUCH DELICA PLUS [id number]) MISC 1 each by Does not apply route daily.   Lancets (ONETOUCH ULTRASOFT) lancets Use with testing once daily and as neededUse as instructed   latanoprost (XALATAN) 0.005 % ophthalmic solution Place 1 drop into both eyes at bedtime.   nystatin ointment (MYCOSTATIN) Apply 1 application topically 2 (two) times daily.   PARoxetine  (PAXIL) 40 MG tablet TAKE 1 TABLET(40 MG) BY MOUTH DAILY   potassium chloride SA (KLOR-CON) 20 MEQ tablet TAKE 2 TABLETS(40 MEQ) BY MOUTH TWICE DAILY   spironolactone (ALDACTONE) 50 MG tablet Take 1 tablet (50 mg total) by mouth daily.   torsemide (DEMADEX) 20 MG tablet TAKE 3 TABLETS(60 MG) BY MOUTH DAILY   No facility-administered medications prior to visit.    Review of Systems  {Labs  Heme  Chem  Endocrine  Serology  Results Review (optional):23779}   Objective    There were no vitals taken for this visit. {Show previous vital signs (optional):23777}  Physical Exam  ***  No results found for any visits on 02/20/21.  Assessment & Plan     ***  No follow-ups on file.      {provider attestation***:1}   Lelon Huh, MD  [facility] [phone] (phone) [phone] (fax)  [place]

## 2021-02-20 NOTE — Telephone Encounter (Signed)
Patient called to request changing OV to VV today at 1:20. [facility] unable to change visit request. Reason for change from OV to VV due to chronic difficulty breathing issues and does not want to come out in cold due to chronic resp. Failure , COPD and multiple hx to prevent sickness. Denies worsening breathing issues. Reviewed with patient if breathing symptoms worsen go to ED or call 911. Please advise if appt can be changed to virtual visit.

## 2021-02-20 NOTE — Telephone Encounter (Signed)
Tried calling patient. No answer. Unable to leave message due to patients voicemail box being full. It is already past patients appointment time, so he will likely need to reschedule appointment.

## 2021-03-03 ENCOUNTER — Telehealth: Payer: Self-pay | Admitting: Cardiovascular Disease

## 2021-03-03 ENCOUNTER — Telehealth: Payer: Self-pay | Admitting: Family Medicine

## 2021-03-03 DIAGNOSIS — F418 Other specified anxiety disorders: Secondary | ICD-10-CM

## 2021-03-03 MED ORDER — PAROXETINE HCL 40 MG PO TABS: 40.0000 mg | ORAL_TABLET | Freq: Every day | ORAL | 0 refills | Status: DC

## 2021-03-03 NOTE — Telephone Encounter (Signed)
Called to review that medication instructions are different from what we have in our system. He reported that Dr. Cephas Darby had increased to twice a day. Instructed that we only have it listed as once a day and if that provider changed to twice a day then he would need to reach out to them for further refills. He also inquired about farxiga and that was ordered by Dr. Natale Milch. Encouraged him to check with primary care provider for the farxiga refill. He verbalized understanding with no further questions at this time.

## 2021-03-03 NOTE — Telephone Encounter (Signed)
[facility] faxed refill request for the following medications:  PARoxetine (PAXIL) 40 MG tablet   Please advise.

## 2021-03-03 NOTE — Telephone Encounter (Signed)
Patient is requesting [place] to refill his Spironolactone 50 mg  - Twice a day.   Per medication list: Spironolactone 50 mg - Take one tablet my mouth daily.  Per Christell Faith, PA last office note :  "HFpEF: He appears euvolemic and well compensated.  He remains on torsemide 60 mg daily along with spironolactone and SGLT2i.  Labs stable."  Please advise if you would like me to refill as twice a day.

## 2021-03-03 NOTE — Telephone Encounter (Signed)
*  STAT* If patient is at the pharmacy, call can be transferred to refill team.   1. Which medications need to be refilled? (please list name of each medication and dose if known) spironolactone 50 MG 2 times daily (per patient)  2. Which pharmacy/location (including street and city if local pharmacy) is medication to be sent to? [facility] in [place]   3. Do they need a 30 day or 90 day supply? 90 day

## 2021-03-16 ENCOUNTER — Ambulatory Visit (INDEPENDENT_AMBULATORY_CARE_PROVIDER_SITE_OTHER): Payer: Medicare Other | Admitting: Family Medicine

## 2021-03-16 ENCOUNTER — Other Ambulatory Visit: Payer: Self-pay

## 2021-03-16 ENCOUNTER — Encounter: Payer: Self-pay | Admitting: Family Medicine

## 2021-03-16 VITALS — BP 121/75 | HR 75 | Temp 97.5°F | Resp 16 | Ht 62.0 in | Wt 204.8 lb

## 2021-03-16 DIAGNOSIS — J0100 Acute maxillary sinusitis, unspecified: Secondary | ICD-10-CM | POA: Diagnosis not present

## 2021-03-16 DIAGNOSIS — K047 Periapical abscess without sinus: Secondary | ICD-10-CM

## 2021-03-16 MED ORDER — DOXYCYCLINE HYCLATE 100 MG PO TABS: 100.0000 mg | ORAL_TABLET | Freq: Two times a day (BID) | ORAL | 0 refills | Status: AC

## 2021-03-16 NOTE — Progress Notes (Signed)
Established patient visit   Patient: Scott [name]   DOB: [birth year]   50 y.o. Male  MRN: [id number] Visit Date: 03/16/2021  Today's healthcare provider: Lavon Paganini, MD   Chief Complaint  Patient presents with   sinus symptoms   Subjective     Sinus pain A week ago, tongue, jaw, and neck pain that spread to sinuses and neck Sinus pressure, ear pressure, sore throat No fever, cough Using mucinex DM, mouthwash, and salt water gargles with temporary relief  GI upset Started 3 days ago Decreased appetite Pepto-bismol provides temporary relief Nausea and upset stomach, diarrhea Lost 5 lbs in last week  Medications: Outpatient Medications Prior to Visit  Medication Sig   albuterol (VENTOLIN HFA) 108 (90 Base) MCG/ACT inhaler Inhale 2 puffs into the lungs every 4 (four) hours as needed for wheezing or shortness of breath.   ANDROGEL PUMP 20.25 MG/ACT (1.62%) GEL Apply 1 application topically daily.    bisoprolol (ZEBETA) 5 MG tablet Take 0.5 tablet (2.5 mg) by mouth once daily   blood glucose meter kit and supplies Use 1-2 times a day as needed.Dispense based on patient and insurance preference. (FOR ICD-10 E10.9, E11.9).   budesonide-formoterol (SYMBICORT) 80-4.5 MCG/ACT inhaler SMARTSIG:2 Puff(s) By Mouth Twice Daily   dapagliflozin propanediol (FARXIGA) 10 MG TABS tablet Take 10 mg by mouth daily.   glucose blood (ONETOUCH VERIO) test strip USE TO CHECK BLOOD SUGAR EVERY DAY   Lancets (ONETOUCH DELICA PLUS [id number]) MISC 1 each by Does not apply route daily.   Lancets (ONETOUCH ULTRASOFT) lancets Use with testing once daily and as neededUse as instructed   latanoprost (XALATAN) 0.005 % ophthalmic solution Place 1 drop into both eyes at bedtime.   nystatin ointment (MYCOSTATIN) Apply 1 application topically 2 (two) times daily.   PARoxetine (PAXIL) 40 MG tablet Take 1 tablet (40 mg total) by mouth daily. Please schedule an office visit before anymore refills.    potassium chloride SA (KLOR-CON) 20 MEQ tablet TAKE 2 TABLETS(40 MEQ) BY MOUTH TWICE DAILY   spironolactone (ALDACTONE) 50 MG tablet Take 1 tablet (50 mg total) by mouth daily.   torsemide (DEMADEX) 20 MG tablet TAKE 3 TABLETS(60 MG) BY MOUTH DAILY   No facility-administered medications prior to visit.    Review of Systems  Constitutional:  Positive for appetite change and fatigue. Negative for chills, diaphoresis and fever.  HENT:  Positive for dental problem, sinus pressure and sore throat. Negative for sinus pain and trouble swallowing.   Respiratory:  Negative for cough.   Cardiovascular:  Positive for chest pain.  Gastrointestinal:  Positive for abdominal pain, diarrhea and nausea. Negative for blood in stool and vomiting.  Genitourinary: Negative.       Objective    BP 121/75 (BP Location: Right Arm, Patient Position: Sitting, Cuff Size: Normal)   Pulse 75   Temp (!) 97.5 F (36.4 C) (Oral)   Resp 16   Ht '5\' 2"'  (1.575 m)   Wt 204 lb 12.8 oz (92.9 kg)   SpO2 98% Comment: with O2 at 3L  BMI 37.46 kg/m  {Show previous vital signs (optional):23777}  Physical Exam Vitals reviewed.  Constitutional:      General: He is not in acute distress.    Appearance: Normal appearance. He is not ill-appearing or toxic-appearing.  HENT:     Head: Normocephalic and atraumatic.     Right Ear: Tympanic membrane, ear canal and external ear normal.     Left  Ear: Tympanic membrane, ear canal and external ear normal.     Nose: Congestion and rhinorrhea present.     Mouth/Throat:     Mouth: Mucous membranes are moist.     Dentition: Abnormal dentition. Dental caries present.     Comments: White plaque on tongue Eyes:     General: No scleral icterus.    Extraocular Movements: Extraocular movements intact.     Conjunctiva/sclera: Conjunctivae normal.     Pupils: Pupils are equal, round, and reactive to light.  Cardiovascular:     Rate and Rhythm: Normal rate and regular rhythm.      Pulses: Normal pulses.     Heart sounds: Normal heart sounds. No murmur heard.   No friction rub. No gallop.  Pulmonary:     Effort: Pulmonary effort is normal.  Abdominal:     General: Abdomen is flat. There is no distension.     Palpations: Abdomen is soft.     Tenderness: There is no abdominal tenderness.  Musculoskeletal:        General: Normal range of motion.     Cervical back: Normal range of motion and neck supple. Tenderness present. No rigidity.  Skin:    General: Skin is warm and dry.     Capillary Refill: Capillary refill takes less than 2 seconds.     Findings: No lesion or rash.  Neurological:     General: No focal deficit present.     Mental Status: He is alert and oriented to person, place, and time.  Psychiatric:        Mood and Affect: Mood normal.        Behavior: Behavior normal.     No results found for any visits on 03/16/21.  Assessment & Plan     1. Dental infection Infection noted on right lower molar - Ambulatory referral to Dentistry  2. Acute non-recurrent maxillary sinusitis Concerning for bacterial infection due to dental infection causing jaw and neck pain GI issues likely due to superimposed viral etiology vs secondary symptoms from current infection Start doxycycline 156m BID for 7 days Return precautions discussed  Return in about 2 months (around 05/17/2021) for chronic disease f/u.      ENelva Nay Medical Student 03/16/2021, 5:20 PM   Patient seen along with MS3 student ENelva Nay I personally evaluated this patient along with the student, and verified all aspects of the history, physical exam, and medical decision making as documented by the student. I agree with the student's documentation and have made all necessary edits.  Sharma Lawrance, ADionne Bucy MD, MPH [facility]

## 2021-03-19 ENCOUNTER — Ambulatory Visit (INDEPENDENT_AMBULATORY_CARE_PROVIDER_SITE_OTHER): Payer: Medicare Other

## 2021-03-19 DIAGNOSIS — I5032 Chronic diastolic (congestive) heart failure: Secondary | ICD-10-CM

## 2021-03-19 DIAGNOSIS — E1169 Type 2 diabetes mellitus with other specified complication: Secondary | ICD-10-CM

## 2021-03-19 DIAGNOSIS — J449 Chronic obstructive pulmonary disease, unspecified: Secondary | ICD-10-CM

## 2021-03-19 NOTE — Chronic Care Management (AMB) (Addendum)
Chronic Care Management   CCM RN Visit Note  03/19/2021 Name: Scott [name] MRN: [id number] DOB: 12-30-1970  Subjective: Scott [name] is a 50 y.o. year old male who is a primary care patient of Caryn Section, Kirstie Peri, MD. The care management team was consulted for assistance with disease management and care coordination needs.    Engaged with patient by telephone for follow up visit in response to provider referral for case management and care coordination services.   Consent to Services:  The patient was given information about Chronic Care Management services, agreed to services, and gave verbal consent prior to initiation of services.  Please see initial visit note for detailed documentation.    Assessment: Review of patient past medical history, allergies, medications, health status, including review of consultants reports, laboratory and other test data, was performed as part of comprehensive evaluation and provision of chronic care management services.   SDOH (Social Determinants of Health) assessments and interventions performed: No  CCM Care Plan  Allergies  Allergen Reactions   Codeine Shortness Of Breath    Outpatient Encounter Medications as of 03/19/2021  Medication Sig   albuterol (VENTOLIN HFA) 108 (90 Base) MCG/ACT inhaler Inhale 2 puffs into the lungs every 4 (four) hours as needed for wheezing or shortness of breath.   ANDROGEL PUMP 20.25 MG/ACT (1.62%) GEL Apply 1 application topically daily.    bisoprolol (ZEBETA) 5 MG tablet Take 0.5 tablet (2.5 mg) by mouth once daily   blood glucose meter kit and supplies Use 1-2 times a day as needed.Dispense based on patient and insurance preference. (FOR ICD-10 E10.9, E11.9).   budesonide-formoterol (SYMBICORT) 80-4.5 MCG/ACT inhaler SMARTSIG:2 Puff(s) By Mouth Twice Daily   dapagliflozin propanediol (FARXIGA) 10 MG TABS tablet Take 10 mg by mouth daily.   doxycycline (VIBRA-TABS) 100 MG tablet Take 1 tablet (100 mg total) by mouth  2 (two) times daily for 7 days.   glucose blood (ONETOUCH VERIO) test strip USE TO CHECK BLOOD SUGAR EVERY DAY   Lancets (ONETOUCH DELICA PLUS [id number]) MISC 1 each by Does not apply route daily.   Lancets (ONETOUCH ULTRASOFT) lancets Use with testing once daily and as neededUse as instructed   latanoprost (XALATAN) 0.005 % ophthalmic solution Place 1 drop into both eyes at bedtime.   nystatin ointment (MYCOSTATIN) Apply 1 application topically 2 (two) times daily.   PARoxetine (PAXIL) 40 MG tablet Take 1 tablet (40 mg total) by mouth daily. Please schedule an office visit before anymore refills.   potassium chloride SA (KLOR-CON) 20 MEQ tablet TAKE 2 TABLETS(40 MEQ) BY MOUTH TWICE DAILY   spironolactone (ALDACTONE) 50 MG tablet Take 1 tablet (50 mg total) by mouth daily.   torsemide (DEMADEX) 20 MG tablet TAKE 3 TABLETS(60 MG) BY MOUTH DAILY   No facility-administered encounter medications on file as of 03/19/2021.    Patient Active Problem List   Diagnosis Date Noted   Fungal rash of right side face.  05/19/2020   Fall from slipping on ice 05/19/2020   Pain due to onychomycosis of toenails of both feet 03/13/2020   Diabetes mellitus without complication ([place]) 64/33/2951   History of bradycardia 02/13/2020   COPD exacerbation ([place]) 12/30/2019   Acute on chronic respiratory failure with hypoxia ([place]) 12/30/2019   Depression 12/30/2019   Acute renal failure superimposed on stage 2 chronic kidney disease ([place]) 12/30/2019   Chronic diastolic heart failure ([place]) 02/16/2018   Accelerated hypertension 88/41/6606   Acute diastolic heart failure ([place])  Kyphoscoliosis and scoliosis 01/30/2018   Erectile dysfunction 09/09/2016   Disorder of bursae of shoulder region 09/07/2016   Impingement syndrome of shoulder region 09/07/2016   Anxiety 09/11/2014   Airway hyperreactivity 09/11/2014   CN (constipation) 09/11/2014   Alopecia, male pattern 09/11/2014   Decreased libido 09/11/2014    Dysfunction of eustachian tube 09/11/2014   Groin strain 09/11/2014   Cephalalgia 67/54/4920   Umbilical hernia 01/09/1218   Essential hypertension 09/11/2014   Migraine 09/11/2014   Neoplasm of uncertain behavior 75/88/3254   Hernia of anterior abdominal wall 09/11/2014   Contact dermatitis due to Genus Toxicodendron 09/11/2014   Open-angle glaucoma 05/20/2009   Congenital hydrocephalus ([place]) 05/20/2009   Klippel-Feil syndrome 05/20/2009   Enteritis presumed infectious 06/11/2008   HLD (hyperlipidemia) 03/15/2008   Clinical depression 06/13/2007    Patient Care Plan: RN Care Management Plan of Care     Problem Identified: COPD, CHF, Depression and DM      Long-Range Goal: Disease Progression Prevented or Minimized   Start Date: 03/19/2021  Expected End Date: 06/17/2021  Priority: High  Note:   Current Barriers:  Chronic Disease Management support and education needs related to COPD, CHF, DMII, and Depression   RNCM Clinical Goal(s):  Patient will demonstrate Ongoing adherence to prescribed treatment plan for COPD, CHF, DMII and Depression through collaboration with the provider, RN Care manager, and the care management team.  Interventions: 1:1 collaboration with primary care provider regarding development and update of comprehensive plan of care as evidenced by provider attestation and co-signature Inter-disciplinary care team collaboration (see longitudinal plan of care) Evaluation of current treatment plan related to  self management and patient's adherence to plan as established by provider   COPD Interventions:   Reviewed medications and COPD management.  Discussed triggers and concerns related to weather changes. Reports symptoms have been controlled. Advised to continue taking needed precautions to prevent exacerbation. Continues to use oxygen at 3L/min. Expressed concerns regarding need for additional oxygen supplies. Will contact [employer] to discuss and arrange  delivery. Reviewed current action plan and reinforced importance of daily self-assessment. Advised to make an appointment with a provider if experiencing moderate symptoms for greater than 48 hours without improvement.  Provided information regarding infection prevention and increased risk r/t COPD. Advised to utilize prevention strategies to reduce risk of respiratory infection  Reviewed worsening symptoms that require immediate medical attention.    CHF Interventions: Wt Readings from Last 3 Encounters:  03/16/21 204 lb 12.8 oz (92.9 kg)  02/03/21 210 lb (95.3 kg)  08/12/20 213 lb (96.6 kg)    Discussed current plan for CHF management.  Reviewed recommended weight parameters. Encouraged to weigh daily and record readings. Encouraged to notify provider for weight gain greater than 3 lbs overnight or weight gain greater than 5 lbs within a week.  Reviewed s/sx of fluid overload and indications for notifying a provider. Encouraged to assess symptoms daily and contact clinic with concerns as needed. Discussed nutritional intake. Advised to continue monitoring sodium consumption and avoid highly processed foods when possible. Reviewed worsening s/sx related to CHF exacerbation and indications for seeking immediate medical attention.  Depression/Anxiety: (Pending/No Interventions during this encounter)   Diabetes Interventions:   Assessed patient's understanding of A1c goal: <7% Lab Results  Component Value Date   HGBA1C 8.3 (H) 08/15/2020  Reviewed medications and plan for diabetes management. Reports taking medications as prescribed. Advised to continue taking as prescribed. Advised to keep the care management team updated of concerns regarding  medication management and prescription cost. Reviewed blood glucose readings. Reviewed s/sx of hypoglycemia and hyperglycemia along with recommended interventions. Reports significant improvements with fasting readings since starting Ozempic. Reports  highest reading of 140 mg/dl. Reports most readings have ranged in the 90's and 120's.  Discussed nutritional intake and importance of complying with the recommended ADA/diabetic diet. Advised to keep the care management team updated of needs related to nutritional resources. Discussed importance of completing recommended DM preventive care. Reports exams are up to date with exception of dental. Reports previously contacting several dental providers. He has not established care due to not finding a dentist that can provide his preferred method of sedation. Will update the clinic if a new referral is required.   Patient Goals/Self-Care Activities: Take all medications as prescribed Attend all scheduled provider appointments Call pharmacy for medication refills 3-7 days in advance of running out of medications Perform all self care activities independently  Call provider office for new concerns or questions   Follow Up Plan:   Will follow up within the next week      PLAN Will follow up within the next week.   Cristy [facility] Care Management [facility] [phone]

## 2021-03-20 ENCOUNTER — Ambulatory Visit: Payer: Self-pay

## 2021-03-20 DIAGNOSIS — F418 Other specified anxiety disorders: Secondary | ICD-10-CM

## 2021-03-20 DIAGNOSIS — J449 Chronic obstructive pulmonary disease, unspecified: Secondary | ICD-10-CM

## 2021-03-20 DIAGNOSIS — I5032 Chronic diastolic (congestive) heart failure: Secondary | ICD-10-CM

## 2021-03-20 NOTE — Chronic Care Management (AMB) (Signed)
Chronic Care Management   CCM RN Visit Note  03/20/2021 Name: Scott [name] MRN: [id number] DOB: [birth year]  Subjective: Scott [name] is a 51 y.o. year old male . The care management team was consulted for assistance with disease management and care coordination needs.    Engaged with patient by telephone for follow up visit in response to provider referral for case management and care coordination services.   Consent to Services:  The patient was given information about Chronic Care Management services, agreed to services, and gave verbal consent prior to initiation of services.  Please see initial visit note for detailed documentation.   Assessment: Review of patient past medical history, allergies, medications, health status, including review of consultants reports, laboratory and other test data, was performed as part of comprehensive evaluation and provision of chronic care management services.   SDOH (Social Determinants of Health) assessments and interventions performed:  No  CCM Care Plan  Allergies  Allergen Reactions   Codeine Shortness Of Breath    Outpatient Encounter Medications as of 03/20/2021  Medication Sig Note   albuterol (VENTOLIN HFA) 108 (90 Base) MCG/ACT inhaler Inhale 2 puffs into the lungs every 4 (four) hours as needed for wheezing or shortness of breath.    ANDROGEL PUMP 20.25 MG/ACT (1.62%) GEL Apply 1 application topically daily.     bisoprolol (ZEBETA) 5 MG tablet Take 0.5 tablet (2.5 mg) by mouth once daily    blood glucose meter kit and supplies Use 1-2 times a day as needed.Dispense based on patient and insurance preference. (FOR ICD-10 E10.9, E11.9).    budesonide-formoterol (SYMBICORT) 80-4.5 MCG/ACT inhaler SMARTSIG:2 Puff(s) By Mouth Twice Daily    dapagliflozin propanediol (FARXIGA) 10 MG TABS tablet Take 10 mg by mouth daily.    doxycycline (VIBRA-TABS) 100 MG tablet Take 1 tablet (100 mg total) by mouth 2 (two) times daily for 7 days.    glucose  blood (ONETOUCH VERIO) test strip USE TO CHECK BLOOD SUGAR EVERY DAY    Lancets (ONETOUCH DELICA PLUS [id number]) MISC 1 each by Does not apply route daily.    Lancets (ONETOUCH ULTRASOFT) lancets Use with testing once daily and as neededUse as instructed    latanoprost (XALATAN) 0.005 % ophthalmic solution Place 1 drop into both eyes at bedtime.    nystatin ointment (MYCOSTATIN) Apply 1 application topically 2 (two) times daily.    PARoxetine (PAXIL) 40 MG tablet Take 1 tablet (40 mg total) by mouth daily. Please schedule an office visit before anymore refills.    potassium chloride SA (KLOR-CON) 20 MEQ tablet TAKE 2 TABLETS(40 MEQ) BY MOUTH TWICE DAILY    Semaglutide (OZEMPIC, 0.25 OR 0.5 MG/DOSE, [place]) Inject 0.5 mg into the skin.    spironolactone (ALDACTONE) 50 MG tablet Take 1 tablet (50 mg total) by mouth daily. 03/19/2021: Reports dose was changed to twice a day.   torsemide (DEMADEX) 20 MG tablet TAKE 3 TABLETS(60 MG) BY MOUTH DAILY    No facility-administered encounter medications on file as of 03/20/2021.    Patient Active Problem List   Diagnosis Date Noted   Fungal rash of right side face.  05/19/2020   Fall from slipping on ice 05/19/2020   Pain due to onychomycosis of toenails of both feet 03/13/2020   Diabetes mellitus without complication ([place]) 34/28/7681   History of bradycardia 02/13/2020   COPD exacerbation ([place]) 12/30/2019   Acute on chronic respiratory failure with hypoxia ([place]) 12/30/2019   Depression 12/30/2019   Acute renal failure  superimposed on stage 2 chronic kidney disease ([place]) 12/30/2019   Chronic diastolic heart failure ([place]) 02/16/2018   Accelerated hypertension 75/88/3254   Acute diastolic heart failure ([facility])    Kyphoscoliosis and scoliosis 01/30/2018   Erectile dysfunction 09/09/2016   Disorder of bursae of shoulder region 09/07/2016   Impingement syndrome of shoulder region 09/07/2016   Anxiety 09/11/2014   Airway hyperreactivity 09/11/2014   CN  (constipation) 09/11/2014   Alopecia, male pattern 09/11/2014   Decreased libido 09/11/2014   Dysfunction of eustachian tube 09/11/2014   Groin strain 09/11/2014   Cephalalgia 98/26/4158   Umbilical hernia 30/94/0768   Essential hypertension 09/11/2014   Migraine 09/11/2014   Neoplasm of uncertain behavior 08/81/1031   Hernia of anterior abdominal wall 09/11/2014   Contact dermatitis due to Genus Toxicodendron 09/11/2014   Open-angle glaucoma 05/20/2009   Congenital hydrocephalus ([place]) 05/20/2009   Klippel-Feil syndrome 05/20/2009   Enteritis presumed infectious 06/11/2008   HLD (hyperlipidemia) 03/15/2008   Clinical depression 06/13/2007     Patient Care Plan: RN Care Management Plan of Care     Problem Identified: COPD, CHF, Depression and DM      Long-Range Goal: Disease Progression Prevented or Minimized   Start Date: 03/19/2021  Expected End Date: 06/17/2021  Priority: High  Note:   Current Barriers:  Chronic Disease Management support and education needs related to COPD, CHF, DMII, and Depression   RNCM Clinical Goal(s):  Patient will demonstrate Ongoing adherence to prescribed treatment plan for COPD, CHF, DMII and Depression through collaboration with the provider, RN Care manager, and the care management team.  Interventions: 1:1 collaboration with primary care provider regarding development and update of comprehensive plan of care as evidenced by provider attestation and co-signature Inter-disciplinary care team collaboration (see longitudinal plan of care) Evaluation of current treatment plan related to  self management and patient's adherence to plan as established by provider   COPD Interventions:   Reviewed medications and COPD management.  Discussed triggers and concerns related to weather changes. Reports symptoms have been controlled. Advised to continue taking needed precautions to prevent exacerbation. Continues to use oxygen at 3L/min. Expressed  concerns regarding need for additional oxygen supplies. Will contact [employer] to discuss and arrange delivery. Reviewed current action plan and reinforced importance of daily self-assessment. Advised to make an appointment with provider if experiencing moderate symptoms for greater than 48 hours without improvement.  Provided information regarding infection prevention and increased risk r/t COPD. Advised to utilize prevention strategies to reduce risk of respiratory infection  Reviewed worsening symptoms that require immediate medical attention.  Update on 03/20/2021: Collaborated with [employer] regarding patients need for additional supplies. Reviewed current products on file. Confirmed patient will be provided with additional nasal cannulas and extension tubing. Items to be delivered. Patient will be contacted if additional information is required.   CHF Interventions: Wt Readings from Last 3 Encounters:  03/16/21 204 lb 12.8 oz (92.9 kg)  02/03/21 210 lb (95.3 kg)  08/12/20 213 lb (96.6 kg)    Discussed current plan for CHF management.  Reviewed recommended weight parameters. Encouraged to weigh daily and record readings. Encouraged to notify provider for weight gain greater than 3 lbs overnight or weight gain greater than 5 lbs within a week.  Reviewed s/sx of fluid overload and indications for notifying a provider. Encouraged to assess symptoms daily and contact clinic with concerns as needed. Discussed nutritional intake. Advised to continue monitoring sodium consumption and avoid highly processed foods when possible. Reviewed worsening  s/sx related to CHF exacerbation and indications for seeking immediate medical attention.  Depression/Anxiety:  Reviewed medications and plan for management of depression and anxiety. He reports increased stressed related to financial obligations but overall feels symptoms are very well controlled with the current medication regimen. Expressed concerns  regarding refills for paroxetine and need for a new Psychiatry referral. Collaborated with clinic staff. He is agreeable to a virtual visit on 03/23/21. He will receive a temporary refill of requested meds until he is evaluated by the Psychiatry team.  Diabetes Interventions:   Assessed patient's understanding of A1c goal: <7% Lab Results  Component Value Date   HGBA1C 8.3 (H) 08/15/2020  Reviewed medications and plan for diabetes management. Reports taking medications as prescribed. Advised to continue taking as prescribed. Advised to keep the care management team updated of concerns regarding medication management and prescription cost. Reviewed blood glucose readings. Reviewed s/sx of hypoglycemia and hyperglycemia along with recommended interventions. Reports significant improvements with fasting readings since starting Ozempic. Reports highest reading of 140 mg/dl. Reports most readings have ranged in the 90's and 120's.  Discussed nutritional intake and importance of complying with the recommended ADA/diabetic diet. Advised to keep the care management team updated of needs related to nutritional resources. Discussed importance of completing recommended DM preventive care. Reports exams are up to date with exception of dental. Reports previously contacting several dental providers. He has not established care due to not finding a dentist that can provide his preferred method of sedation. Will update the clinic if a new referral is required.   Patient Goals/Self-Care Activities: Take all medications as prescribed Attend all scheduled provider appointments Call pharmacy for medication refills 3-7 days in advance of running out of medications Perform all self care activities independently  Call provider office for new concerns or questions      PLAN Mr. Nanda will complete virtual outreach with a primary care provider on 03/23/21. A member of the care management team will follow up later this  month.   Cristy Friedlander [facility] Care Management [facility] [phone]

## 2021-03-22 ENCOUNTER — Other Ambulatory Visit: Payer: Self-pay | Admitting: Cardiovascular Disease

## 2021-03-23 ENCOUNTER — Telehealth (INDEPENDENT_AMBULATORY_CARE_PROVIDER_SITE_OTHER): Payer: Medicare Other | Admitting: Family Medicine

## 2021-03-23 DIAGNOSIS — K047 Periapical abscess without sinus: Secondary | ICD-10-CM

## 2021-03-23 DIAGNOSIS — F418 Other specified anxiety disorders: Secondary | ICD-10-CM | POA: Diagnosis not present

## 2021-03-23 DIAGNOSIS — E119 Type 2 diabetes mellitus without complications: Secondary | ICD-10-CM | POA: Diagnosis not present

## 2021-03-23 NOTE — Progress Notes (Signed)
MyChart Video Visit    Virtual Visit via Video Note   This visit type was conducted due to national recommendations for restrictions regarding the COVID-19 Pandemic (e.g. social distancing) in an effort to limit this patient's exposure and mitigate transmission in our community. This patient is at least at moderate risk for complications without adequate follow up. This format is felt to be most appropriate for this patient at this time. Physical exam was limited by quality of the video and audio technology used for the visit.   Patient location: home Provider location: [facility]  I discussed the limitations of evaluation and management by telemedicine and the availability of in person appointments. The patient expressed understanding and agreed to proceed.  Patient: Scott [name]   DOB: Sep 28, 1970   50 y.o. Male  MRN: [id number] Visit Date: 03/23/2021  Today's healthcare provider: Lelon Huh, MD   Chief Complaint  Patient presents with   Referral   Subjective    HPI  Need referrals for a dentist, psychiatrist/phycology. Was previously a patient of Dr. Rosine Door for 4-5 years on paroxetine since the 1990s for depression and anxiety and anxiety attacks. Has not had an anxiety attack for a   Medications: Outpatient Medications Prior to Visit  Medication Sig   albuterol (VENTOLIN HFA) 108 (90 Base) MCG/ACT inhaler Inhale 2 puffs into the lungs every 4 (four) hours as needed for wheezing or shortness of breath.   ANDROGEL PUMP 20.25 MG/ACT (1.62%) GEL Apply 1 application topically daily.    bisoprolol (ZEBETA) 5 MG tablet Take 0.5 tablet (2.5 mg) by mouth once daily   blood glucose meter kit and supplies Use 1-2 times a day as needed.Dispense based on patient and insurance preference. (FOR ICD-10 E10.9, E11.9).   budesonide-formoterol (SYMBICORT) 80-4.5 MCG/ACT inhaler SMARTSIG:2 Puff(s) By Mouth Twice Daily   dapagliflozin propanediol (FARXIGA) 10 MG TABS tablet Take 10 mg by mouth daily.    doxycycline (VIBRA-TABS) 100 MG tablet Take 1 tablet (100 mg total) by mouth 2 (two) times daily for 7 days.   glucose blood (ONETOUCH VERIO) test strip USE TO CHECK BLOOD SUGAR EVERY DAY   Lancets (ONETOUCH DELICA PLUS [id number]) MISC 1 each by Does not apply route daily.   Lancets (ONETOUCH ULTRASOFT) lancets Use with testing once daily and as neededUse as instructed   latanoprost (XALATAN) 0.005 % ophthalmic solution Place 1 drop into both eyes at bedtime.   nystatin ointment (MYCOSTATIN) Apply 1 application topically 2 (two) times daily.   PARoxetine (PAXIL) 40 MG tablet Take 1 tablet (40 mg total) by mouth daily. Please schedule an office visit before anymore refills.   potassium chloride SA (KLOR-CON) 20 MEQ tablet TAKE 2 TABLETS(40 MEQ) BY MOUTH TWICE DAILY   Semaglutide (OZEMPIC, 0.25 OR 0.5 MG/DOSE, [place]) Inject 0.5 mg into the skin.   spironolactone (ALDACTONE) 50 MG tablet Take 1 tablet (50 mg total) by mouth daily.   torsemide (DEMADEX) 20 MG tablet TAKE 3 TABLETS(60 MG) BY MOUTH DAILY   No facility-administered medications prior to visit.    Review of Systems  Constitutional: Negative.   HENT:  Positive for dental problem.   Respiratory: Negative.    Cardiovascular: Negative.   Gastrointestinal: Negative.   Neurological:  Negative for dizziness, light-headedness and headaches.     Objective    There were no vitals taken for this visit.   Physical Exam   Awake, alert, oriented x 3. In no apparent distress   Assessment & Plan  1. Depression with anxiety Previous patient of Dr. Fransisco Hertz and no longer takes his insurance. He has been stable on current dose of paroxetine for many years and advised we could refill it. He would like to get back in with a counselor which he has found to be helpful in the past.  - Ambulatory referral to Psychology  2. Diabetes mellitus without complication [facility]) He is no being managed by Dr. Carmela Rima, recently started on semaglutide  which he feels is working well.   3. Dental infection Doing better on doxycycline prescribed last week.  - Ambulatory referral to Dentistry        I discussed the assessment and treatment plan with the patient. The patient was provided an opportunity to ask questions and all were answered. The patient agreed with the plan and demonstrated an understanding of the instructions.   The patient was advised to call back or seek an in-person evaluation if the symptoms worsen or if the condition fails to improve as anticipated.  I provided 12 minutes of non-face-to-face time during this encounter.  The entirety of the information documented in the History of Present Illness, Review of Systems and Physical Exam were personally obtained by me. Portions of this information were initially documented by the CMA and reviewed by me for thoroughness and accuracy.    Lelon Huh, MD [facility] [phone] (phone) [phone] (fax)  [place]

## 2021-03-23 NOTE — Telephone Encounter (Signed)
°*  STAT* If patient is at the pharmacy, call can be transferred to refill team.   1. Which medications need to be refilled? (please list name of each medication and dose if known) potassium chloride 20 MEQ take 2 tablets by mouth twice daily  2. Which pharmacy/location (including street and city if local pharmacy) is medication to be sent to? [facility] in [place]   3. Do they need a 30 day or 90 day supply? 90 day

## 2021-03-24 ENCOUNTER — Ambulatory Visit: Payer: Self-pay

## 2021-03-24 DIAGNOSIS — J441 Chronic obstructive pulmonary disease with (acute) exacerbation: Secondary | ICD-10-CM

## 2021-03-24 DIAGNOSIS — Z789 Other specified health status: Secondary | ICD-10-CM

## 2021-03-24 NOTE — Chronic Care Management (AMB) (Signed)
Chronic Care Management   CCM RN Visit Note  03/24/2021 Name: Scott [name] MRN: [id number] DOB: 10/29/1970  Subjective: Scott [name] is a 50 y.o. year old male who is a primary care patient of Mikey Kirschner, [place]. The care management team was consulted for assistance with disease management and care coordination needs.    Engaged with patient by telephone for follow up visit in response to provider referral for case management and care coordination services.   Consent to Services:  The patient was given information about Chronic Care Management services, agreed to services, and gave verbal consent prior to initiation of services.  Please see initial visit note for detailed documentation.   Assessment: Review of patient past medical history, allergies, medications, health status, including review of consultants reports, laboratory and other test data, was performed as part of comprehensive evaluation and provision of chronic care management services.   CCM Care Plan  Allergies  Allergen Reactions   Codeine Shortness Of Breath    Outpatient Encounter Medications as of 03/24/2021  Medication Sig Note   albuterol (VENTOLIN HFA) 108 (90 Base) MCG/ACT inhaler Inhale 2 puffs into the lungs every 4 (four) hours as needed for wheezing or shortness of breath.    ANDROGEL PUMP 20.25 MG/ACT (1.62%) GEL Apply 1 application topically daily.     bisoprolol (ZEBETA) 5 MG tablet Take 0.5 tablet (2.5 mg) by mouth once daily    blood glucose meter kit and supplies Use 1-2 times a day as needed.Dispense based on patient and insurance preference. (FOR ICD-10 E10.9, E11.9).    budesonide-formoterol (SYMBICORT) 80-4.5 MCG/ACT inhaler SMARTSIG:2 Puff(s) By Mouth Twice Daily    dapagliflozin propanediol (FARXIGA) 10 MG TABS tablet Take 10 mg by mouth daily.    glucose blood (ONETOUCH VERIO) test strip USE TO CHECK BLOOD SUGAR EVERY DAY    Lancets (ONETOUCH DELICA PLUS [id number]) MISC 1 each by Does not apply  route daily.    Lancets (ONETOUCH ULTRASOFT) lancets Use with testing once daily and as neededUse as instructed    latanoprost (XALATAN) 0.005 % ophthalmic solution Place 1 drop into both eyes at bedtime.    nystatin ointment (MYCOSTATIN) Apply 1 application topically 2 (two) times daily.    PARoxetine (PAXIL) 40 MG tablet Take 1 tablet (40 mg total) by mouth daily. Please schedule an office visit before anymore refills.    potassium chloride SA (KLOR-CON M) 20 MEQ tablet TAKE 2 TABLETS BY MOUTH TWICE DAILY    Semaglutide (OZEMPIC, 0.25 OR 0.5 MG/DOSE, ) Inject 0.5 mg into the skin.    spironolactone (ALDACTONE) 50 MG tablet Take 1 tablet (50 mg total) by mouth daily. 03/19/2021: Reports dose was changed to twice a day.   torsemide (DEMADEX) 20 MG tablet TAKE 3 TABLETS(60 MG) BY MOUTH DAILY    No facility-administered encounter medications on file as of 03/24/2021.    Patient Active Problem List   Diagnosis Date Noted   Fungal rash of right side face.  05/19/2020   Fall from slipping on ice 05/19/2020   Pain due to onychomycosis of toenails of both feet 03/13/2020   Diabetes mellitus without complication ([place]) 17/00/1749   History of bradycardia 02/13/2020   COPD exacerbation ([place]) 12/30/2019   Acute on chronic respiratory failure with hypoxia ([place]) 12/30/2019   Depression 12/30/2019   Acute renal failure superimposed on stage 2 chronic kidney disease ([place]) 12/30/2019   Chronic diastolic heart failure ([place]) 02/16/2018   Accelerated hypertension 44/96/7591   Acute diastolic  heart failure ([place])    Kyphoscoliosis and scoliosis 01/30/2018   Erectile dysfunction 09/09/2016   Disorder of bursae of shoulder region 09/07/2016   Impingement syndrome of shoulder region 09/07/2016   Anxiety 09/11/2014   Airway hyperreactivity 09/11/2014   CN (constipation) 09/11/2014   Alopecia, male pattern 09/11/2014   Decreased libido 09/11/2014   Dysfunction of eustachian tube 09/11/2014   Groin  strain 09/11/2014   Cephalalgia 49/70/2637   Umbilical hernia 85/88/5027   Essential hypertension 09/11/2014   Migraine 09/11/2014   Neoplasm of uncertain behavior 74/03/8785   Hernia of anterior abdominal wall 09/11/2014   Contact dermatitis due to Genus Toxicodendron 09/11/2014   Open-angle glaucoma 05/20/2009   Congenital hydrocephalus ([place]) 05/20/2009   Klippel-Feil syndrome 05/20/2009   Enteritis presumed infectious 06/11/2008   HLD (hyperlipidemia) 03/15/2008   Clinical depression 06/13/2007     Patient Care Plan: RN Care Management Plan of Care     Problem Identified: COPD, CHF, Depression and DM      Long-Range Goal: Disease Progression Prevented or Minimized   Start Date: 03/19/2021  Expected End Date: 06/17/2021  Priority: High  Note:   Current Barriers:  Chronic Disease Management support and education needs related to COPD, CHF, DMII, and Depression   RNCM Clinical Goal(s):  Patient will demonstrate Ongoing adherence to prescribed treatment plan for COPD, CHF, DMII and Depression through collaboration with the provider, RN Care manager, and the care management team.  Interventions: 1:1 collaboration with primary care provider regarding development and update of comprehensive plan of care as evidenced by provider attestation and co-signature Inter-disciplinary care team collaboration (see longitudinal plan of care) Evaluation of current treatment plan related to  self management and patient's adherence to plan as established by provider   COPD Interventions:  Reviewed medications and COPD management.  Discussed triggers and concerns related to weather changes. Reports symptoms have been controlled. Advised to continue taking needed precautions to prevent exacerbation. Continues to use oxygen at 3L/min. Expressed concerns regarding need for additional oxygen supplies. Will contact [employer] to discuss and arrange delivery. Reviewed current action plan and reinforced  importance of daily self-assessment. Advised to make an appointment with provider if experiencing moderate symptoms for greater than 48 hours without improvement.  Provided information regarding infection prevention and increased risk r/t COPD. Advised to utilize prevention strategies to reduce risk of respiratory infection  Reviewed worsening symptoms that require immediate medical attention.   CHF Interventions: Wt Readings from Last 3 Encounters:  03/16/21 204 lb 12.8 oz (92.9 kg)  02/03/21 210 lb (95.3 kg)  08/12/20 213 lb (96.6 kg)    Discussed current plan for CHF management.  Reviewed recommended weight parameters. Encouraged to weigh daily and record readings. Encouraged to notify provider for weight gain greater than 3 lbs overnight or weight gain greater than 5 lbs within a week.  Reviewed s/sx of fluid overload and indications for notifying a provider. Encouraged to assess symptoms daily and contact clinic with concerns as needed. Discussed nutritional intake. Advised to continue monitoring sodium consumption and avoid highly processed foods when possible. Reviewed worsening s/sx related to CHF exacerbation and indications for seeking immediate medical attention.  Depression/Anxiety:  Reviewed medications and plan for management of depression and anxiety. He reports increased stressed related to financial obligations but overall feels symptoms are very well controlled with the current medication regimen. Expressed concerns regarding refills for paroxetine and need for a new Psychiatry referral. Collaborated with clinic staff. He is agreeable to a virtual visit  on 03/23/21. He will receive a temporary refill of requested meds until he is evaluated by the Psychiatry team.   Diabetes Interventions:   Assessed patient's understanding of A1c goal: <7% Lab Results  Component Value Date   HGBA1C 8.3 (H) 08/15/2020  Reviewed medications and plan for diabetes management. Reports taking  medications as prescribed. Advised to continue taking as prescribed. Advised to keep the care management team updated of concerns regarding medication management and prescription cost. Reviewed blood glucose readings. Reviewed s/sx of hypoglycemia and hyperglycemia along with recommended interventions. Reports significant improvements with fasting readings since starting Ozempic. Reports highest reading of 140 mg/dl. Reports most readings have ranged in the 90's and 120's.  Discussed nutritional intake and importance of complying with the recommended ADA/diabetic diet. Advised to keep the care management team updated of needs related to nutritional resources. Discussed importance of completing recommended DM preventive care. Reports exams are up to date with exception of dental. Reports previously contacting several dental providers. He has not established care due to not finding a dentist that can provide his preferred method of sedation. Will update the clinic if a new referral is required.   Community Resources: Patient requesting additional assistance with utilities (gas) and community resources d/t financial restraints. He recently received assistance with his electric bill. Requesting assistance with gas but reports he is unsure if he will qualify for additional assistance via the [employer]. Reports current gas bill is approximately $1900. Will follow up with [facility] and [employer]. Will also discuss with Bonnie to determine services that he qualifies for d/t disability status.  Patient Goals/Self-Care Activities: Take all medications as prescribed Attend all scheduled provider appointments Call pharmacy for medication refills 3-7 days in advance of running out of medications Perform all self care activities independently  Call provider office for new concerns or questions       PLAN A member of the care management team will  follow up within the next month.   Cristy [facility] Care Management [facility] [phone]

## 2021-04-01 ENCOUNTER — Other Ambulatory Visit: Payer: Self-pay | Admitting: Family Medicine

## 2021-04-01 DIAGNOSIS — F418 Other specified anxiety disorders: Secondary | ICD-10-CM

## 2021-04-01 MED ORDER — PAROXETINE HCL 40 MG PO TABS: 40.0000 mg | ORAL_TABLET | Freq: Every day | ORAL | 4 refills | Status: DC

## 2021-04-04 DIAGNOSIS — I5032 Chronic diastolic (congestive) heart failure: Secondary | ICD-10-CM

## 2021-04-04 DIAGNOSIS — F418 Other specified anxiety disorders: Secondary | ICD-10-CM

## 2021-04-04 DIAGNOSIS — J449 Chronic obstructive pulmonary disease, unspecified: Secondary | ICD-10-CM | POA: Diagnosis not present

## 2021-04-04 DIAGNOSIS — E1169 Type 2 diabetes mellitus with other specified complication: Secondary | ICD-10-CM

## 2021-04-04 DIAGNOSIS — J441 Chronic obstructive pulmonary disease with (acute) exacerbation: Secondary | ICD-10-CM

## 2021-04-15 IMAGING — CR DG LUMBAR SPINE 2-3V
1 series · 4 of 4 positions shown · non-contrast
Comparison: None.

CLINICAL DATA: Low back pain, fall

EXAM:
LUMBAR SPINE - 2-3 VIEW

[Series 1: dg lumbar spine 2-3 views · 0.14mm/px · 4 of 4 slices shown]
[im 1/4]
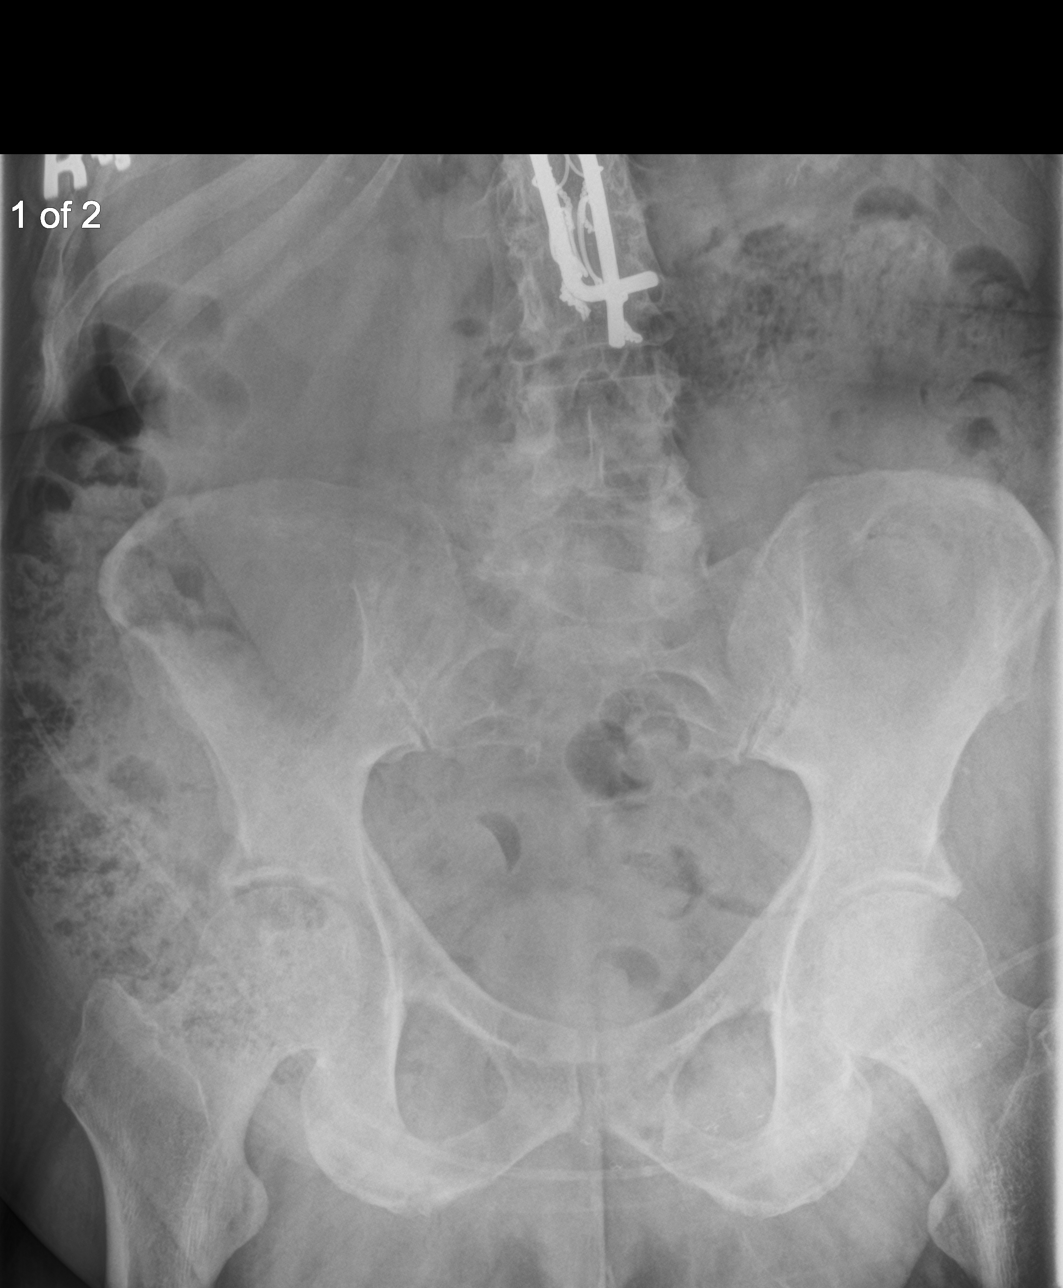
[im 2/4]
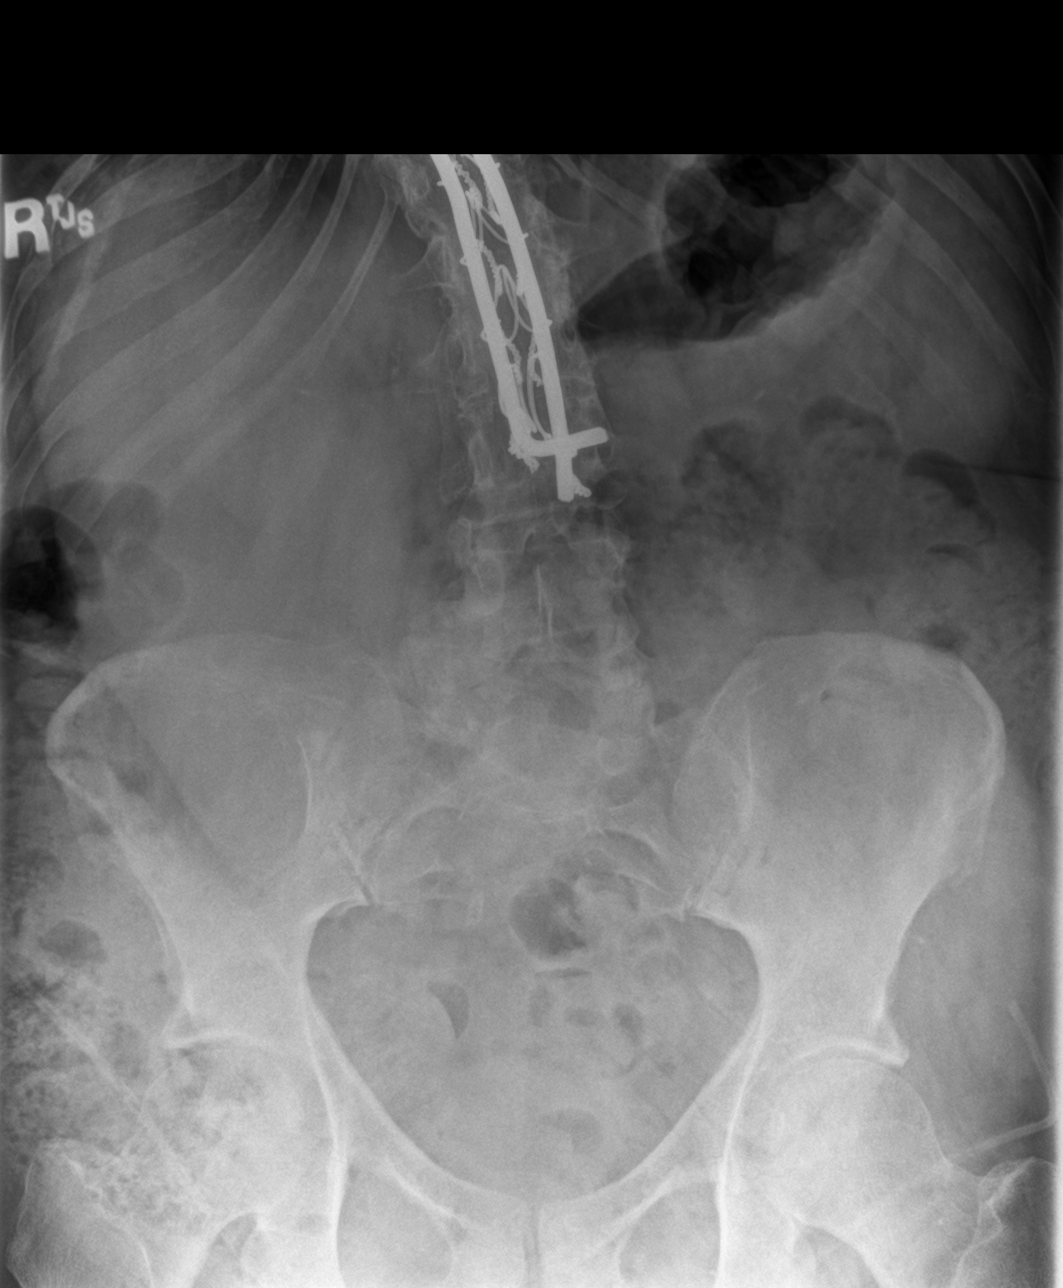
[im 3/4]
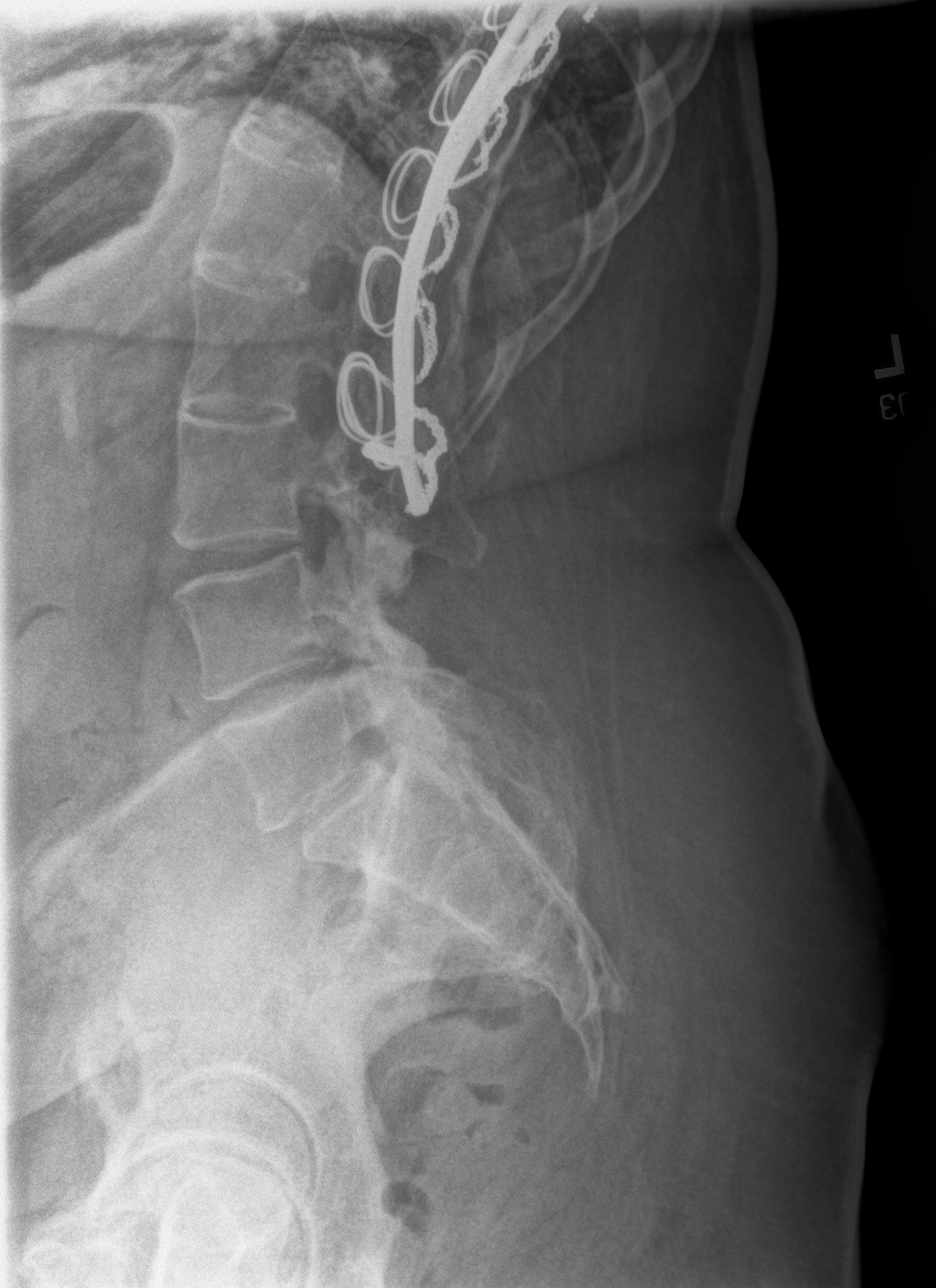
[im 4/4]
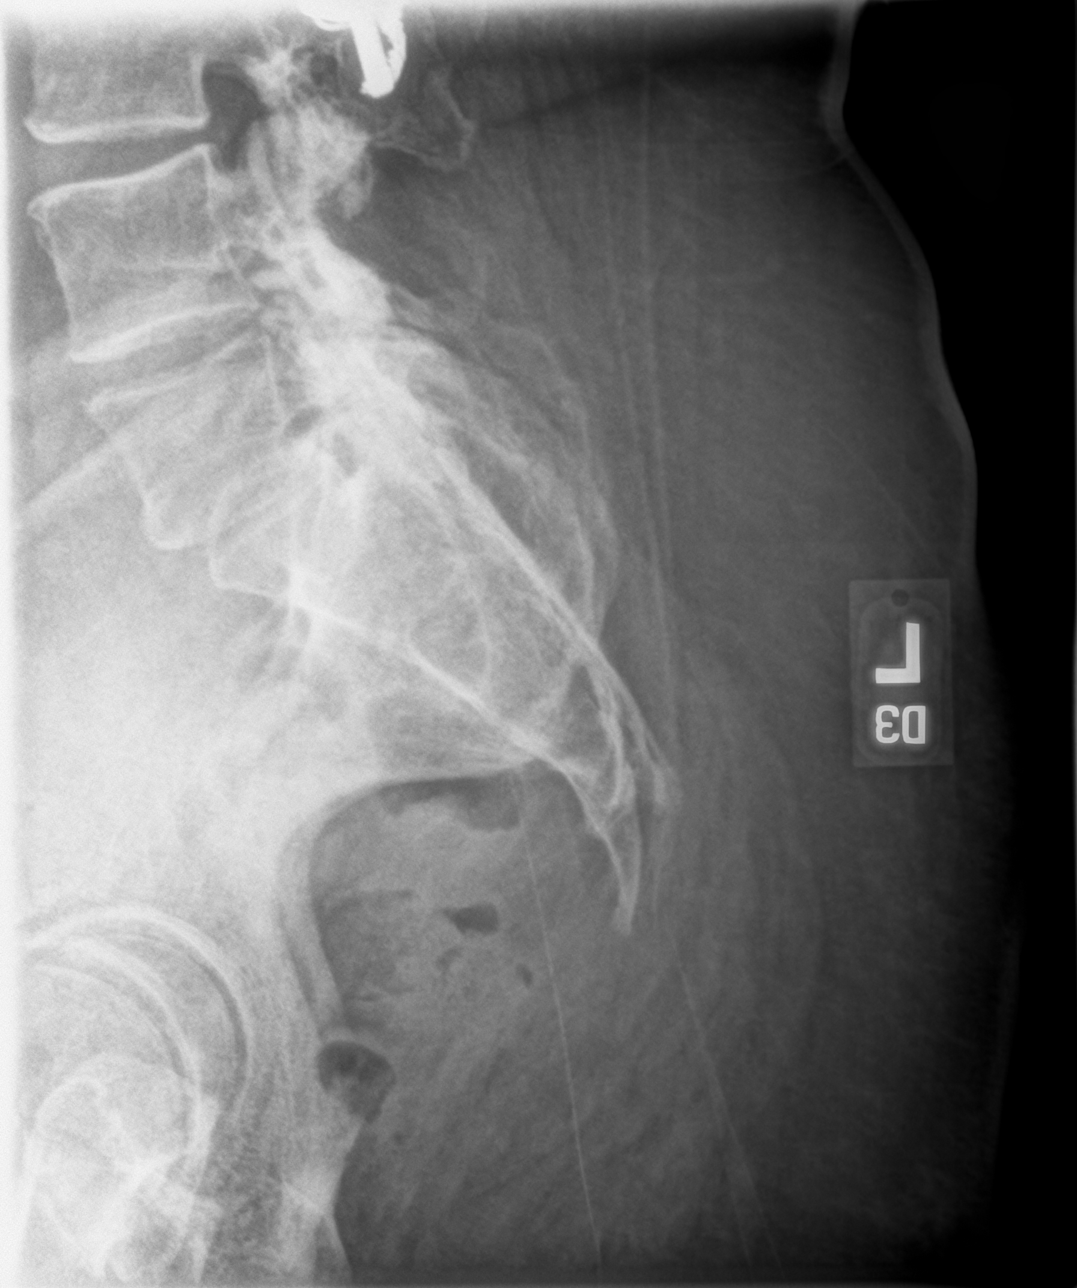

[4 of 4 positions shown; findings below may reference images not displayed]

FINDINGS: Thoracolumbar fusion is partially visualized with solid
incorporation of paraspinal bone graft posteriorly of T12 through
L3. No acute fracture or traumatic listhesis of the lumbar spine.
Mild intervertebral disc space narrowing and grade 1 anterolisthesis
of L5 upon S1 is in keeping with mild degenerative disc disease.
Remaining intervertebral disc heights are preserved. Vertebral body
heights are preserved. Paraspinal soft tissues are unremarkable.
IMPRESSION: No acute fracture or listhesis.

## 2021-04-15 IMAGING — CR DG CERVICAL SPINE COMPLETE 4+V
1 series · 8 of 10 positions shown · non-contrast
Comparison: None.

CLINICAL DATA: Fall, neck pain

EXAM:
CERVICAL SPINE - COMPLETE 4+ VIEW

[Series 1: dg cervical spine complete · 0.14mm/px · 8 of 10 slices shown]
[im 1/10]
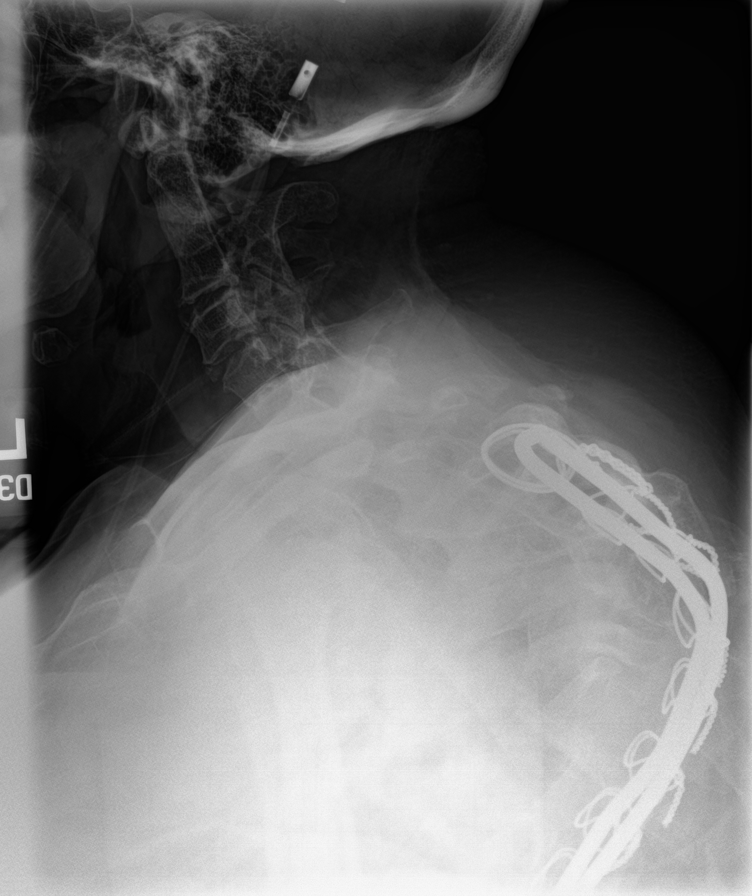
[im 2/10]
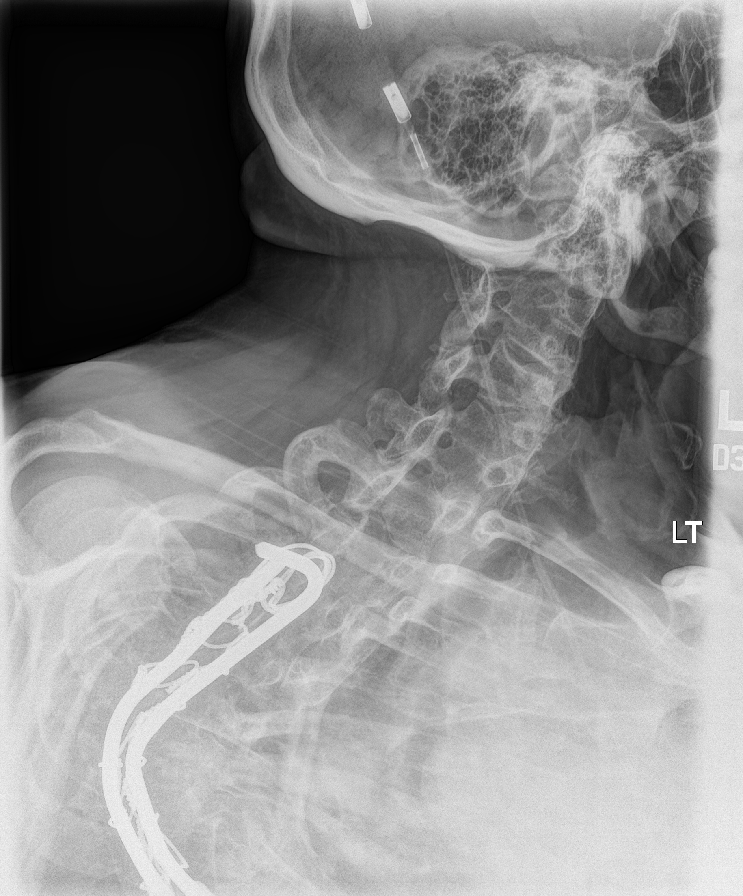
[im 3/10]
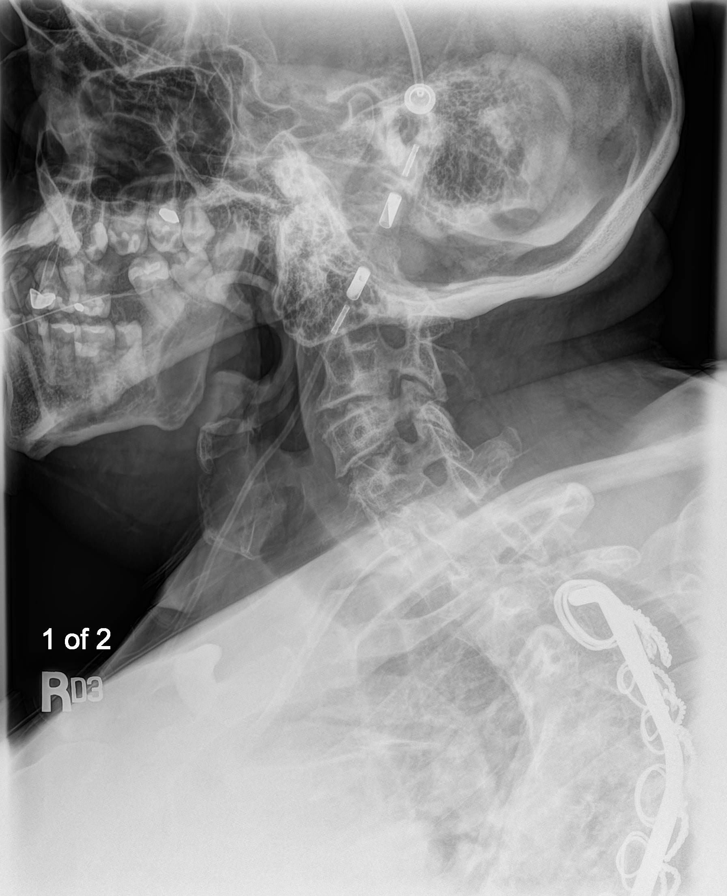
[im 4/10]
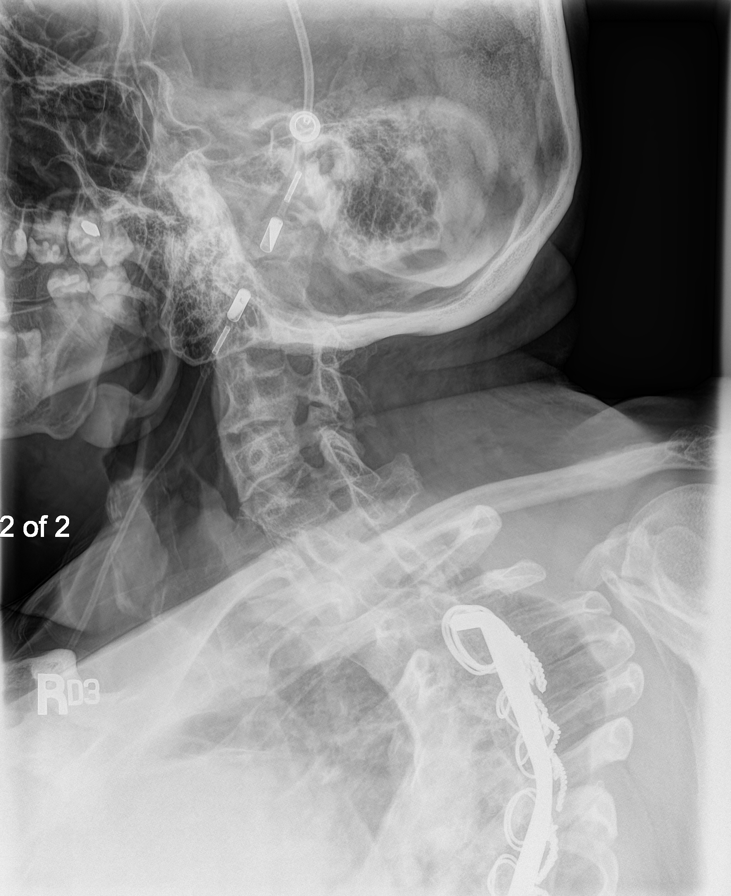
[im 5/10]
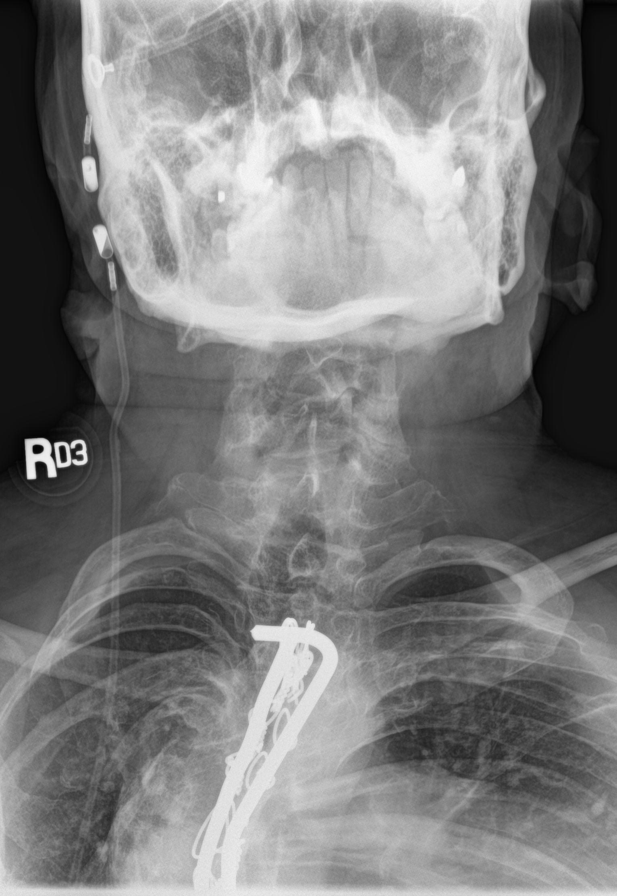
[im 6/10]
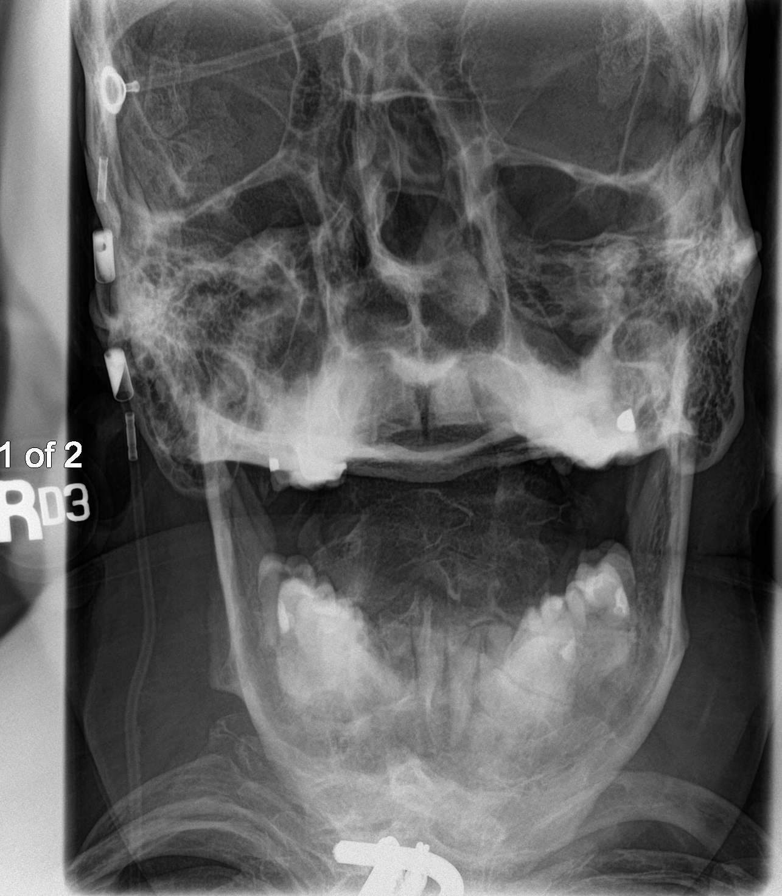
[im 7/10]
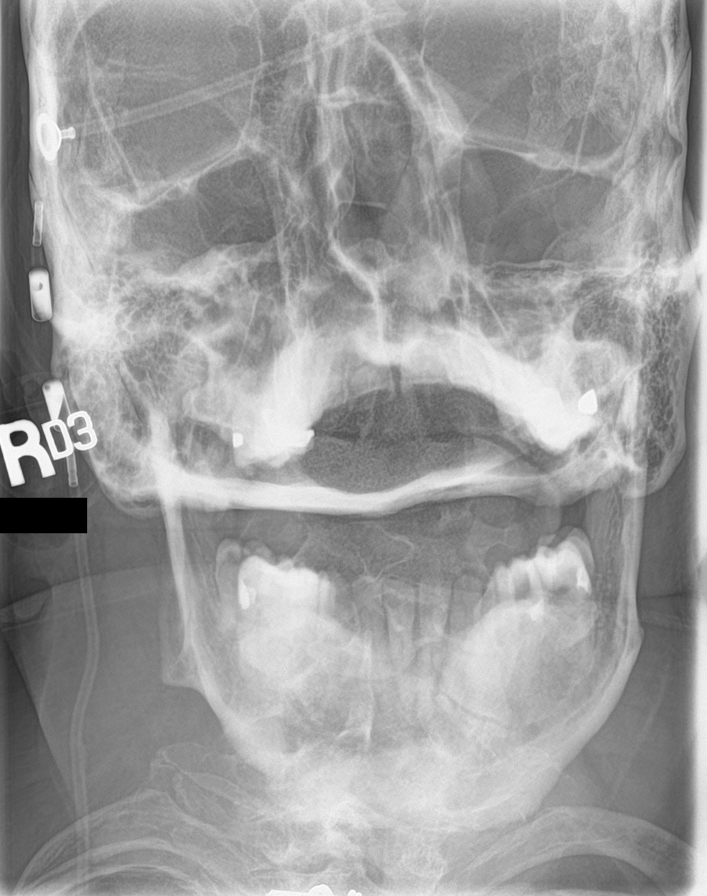
[im 8/10]
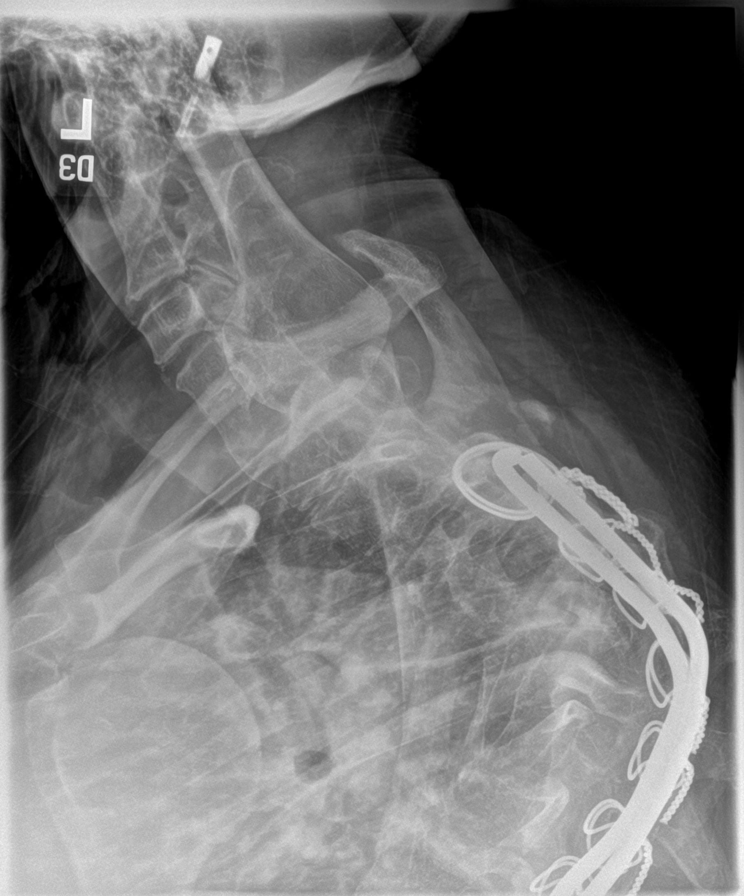

[8 of 10 positions shown; findings below may reference images not displayed]

FINDINGS: Multiple congenital fusions of the cervical spine including C2-3 and
C5-C7. Normal cervical lordosis. No acute fracture or listhesis of
the cervical spine. Vertebral body height has been preserved. Mild
degenerative disc disease at C3-4 and C4-5. Oblique views
demonstrate mild neuroforaminal narrowing on the left at C3-4
secondary to uncovertebral arthrosis and mild congenital
neuroforaminal narrowing on the right at C5-6. The prevertebral soft
tissues are not thickened.
IMPRESSION: No acute fracture or listhesis.

Multiple congenital cervical fusions as outlined above. This could
be better assessed with CT imaging.

## 2021-04-15 IMAGING — CR DG CHEST 2V
1 series · 3 of 3 positions shown · non-contrast
Comparison: 02/13/2020

CLINICAL DATA: Fall, right chest pain

EXAM:
CHEST - 2 VIEW

[Series 1: dg chest 2 view · 0.14mm/px · 3 of 3 slices shown]
[im 1/3]
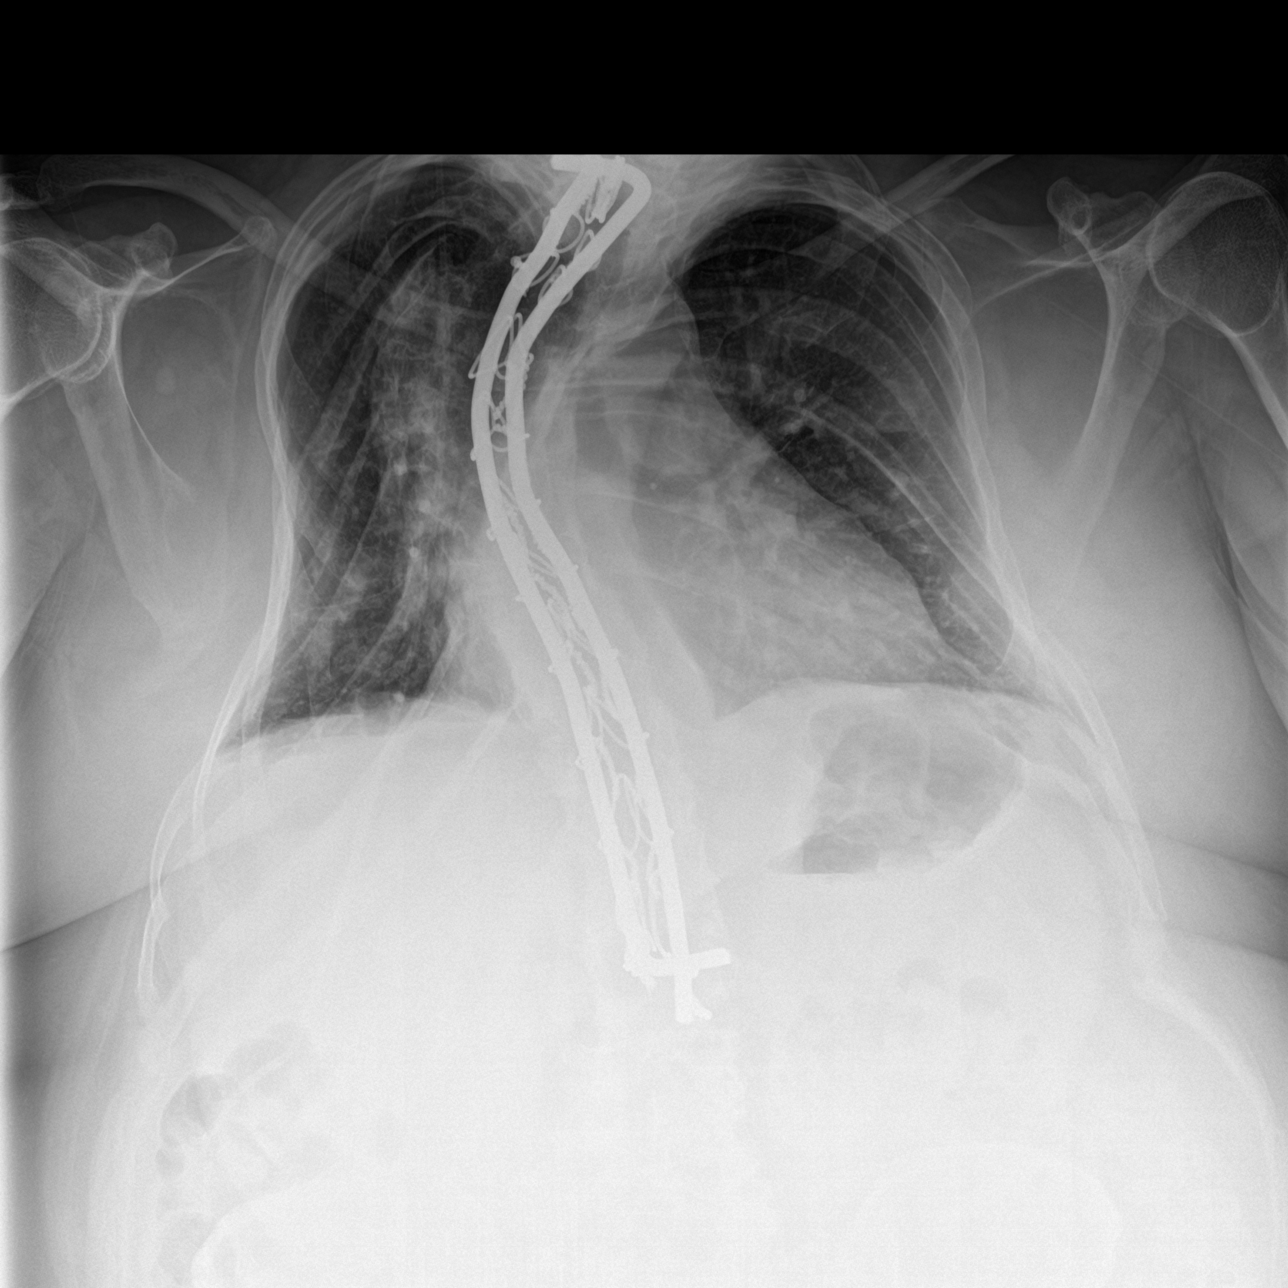
[im 2/3]
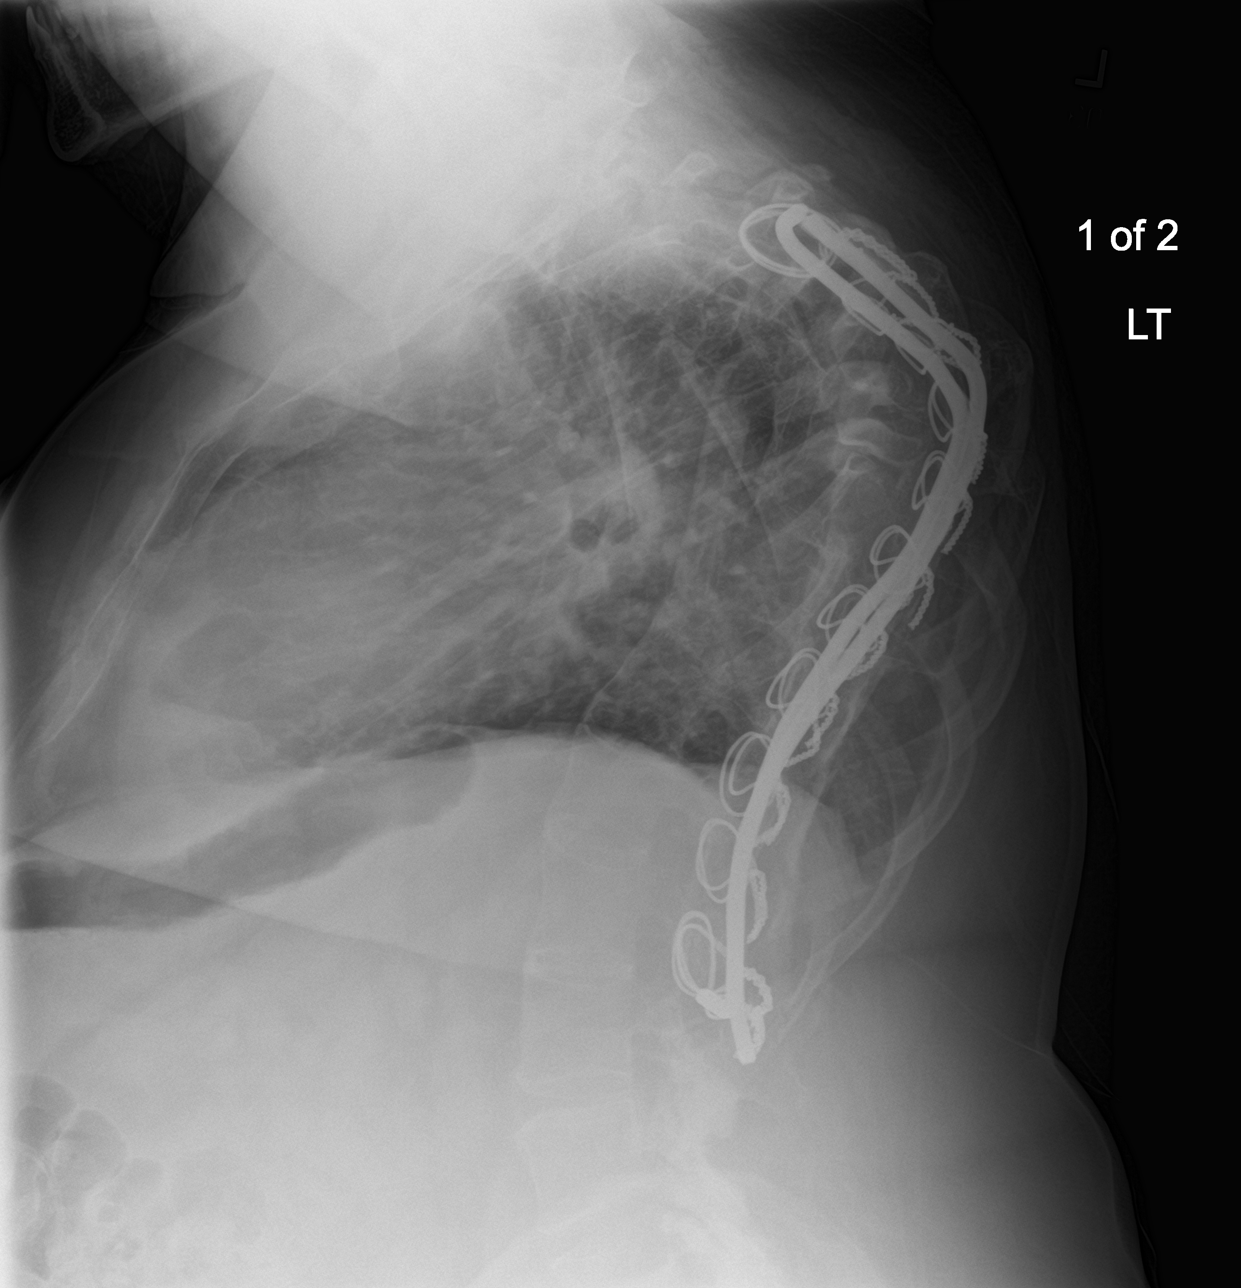
[im 3/3]
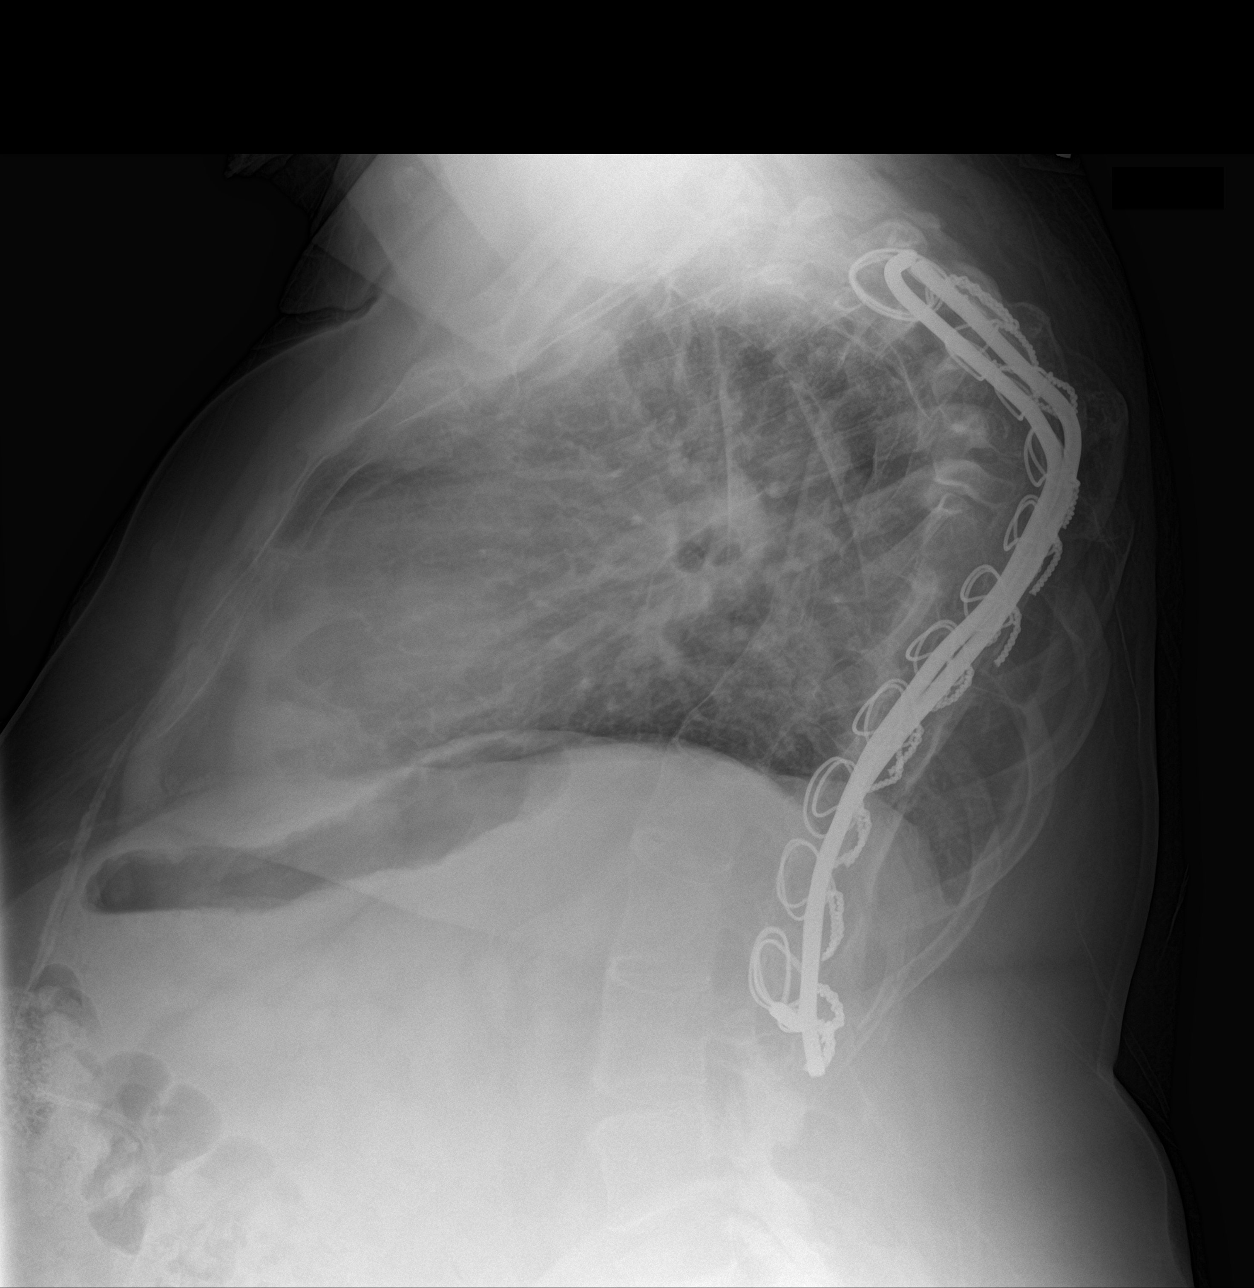

[3 of 3 positions shown; findings below may reference images not displayed]

FINDINGS: Lungs are clear. No pneumothorax or pleural effusion. Cardiac size
is mildly enlarged. Moderate to severe thoracic scoliosis status
post fusion with posterior bars and multiple cerclage wires is again
identified. No acute bone abnormality.
IMPRESSION: No active cardiopulmonary disease.

## 2021-04-22 ENCOUNTER — Ambulatory Visit (INDEPENDENT_AMBULATORY_CARE_PROVIDER_SITE_OTHER): Payer: Medicare Other

## 2021-04-22 ENCOUNTER — Telehealth: Payer: Self-pay | Admitting: *Deleted

## 2021-04-22 DIAGNOSIS — Z0000 Encounter for general adult medical examination without abnormal findings: Secondary | ICD-10-CM | POA: Diagnosis not present

## 2021-04-22 DIAGNOSIS — Z139 Encounter for screening, unspecified: Secondary | ICD-10-CM

## 2021-04-22 NOTE — Chronic Care Management (AMB) (Signed)
°  Chronic Care Management   Note  04/22/2021 Name: Scott [name] MRN: [id number] DOB: 10-12-1970  Scott [name] is a 51 y.o. year old male who is a primary care patient of Mikey Kirschner, [place]. Scott [name] is currently enrolled in care management services. An additional referral for Licensed Clinical SW was placed.   Follow up plan: Telephone appointment with care management team member scheduled for: 05/12/2021  Julian Hy, [facility]  Direct Dial: [phone]

## 2021-04-22 NOTE — Patient Instructions (Signed)
Scott [name] , Thank you for taking time to come for your Medicare Wellness Visit. I appreciate your ongoing commitment to your health goals. Please review the following plan we discussed and let me know if I can assist you in the future.   Screening recommendations/referrals: Colonoscopy: declined referral Recommended yearly ophthalmology/optometry visit for glaucoma screening and checkup Recommended yearly dental visit for hygiene and checkup  Vaccinations: Influenza vaccine: due Pneumococcal vaccine: not age appropriate Tdap vaccine: 02/20/18 Shingles vaccine: n/d   Covid-19: 06/23/19, 07/18/19, 07/25/20  Advanced directives: no  Conditions/risks identified: none  Next appointment: Follow up in one year for your annual wellness visit   Preventive Care 40-64 Years, Male Preventive care refers to lifestyle choices and visits with your health care provider that can promote health and wellness. What does preventive care include? A yearly physical exam. This is also called an annual well check. Dental exams once or twice a year. Routine eye exams. Ask your health care provider how often you should have your eyes checked. Personal lifestyle choices, including: Daily care of your teeth and gums. Regular physical activity. Eating a healthy diet. Avoiding tobacco and drug use. Limiting alcohol use. Practicing safe sex. Taking low-dose aspirin every day starting at age 30. What happens during an annual well check? The services and screenings done by your health care provider during your annual well check will depend on your age, overall health, lifestyle risk factors, and family history of disease. Counseling  Your health care provider may ask you questions about your: Alcohol use. Tobacco use. Drug use. Emotional well-being. Home and relationship well-being. Sexual activity. Eating habits. Work and work Statistician. Screening  You may have the following tests or  measurements: Height, weight, and BMI. Blood pressure. Lipid and cholesterol levels. These may be checked every 5 years, or more frequently if you are over 61 years old. Skin check. Lung cancer screening. You may have this screening every year starting at age 60 if you have a 30-pack-year history of smoking and currently smoke or have quit within the past 15 years. Fecal occult blood test (FOBT) of the stool. You may have this test every year starting at age 75. Flexible sigmoidoscopy or colonoscopy. You may have a sigmoidoscopy every 5 years or a colonoscopy every 10 years starting at age 58. Prostate cancer screening. Recommendations will vary depending on your family history and other risks. Hepatitis C blood test. Hepatitis B blood test. Sexually transmitted disease (STD) testing. Diabetes screening. This is done by checking your blood sugar (glucose) after you have not eaten for a while (fasting). You may have this done every 1-3 years. Discuss your test results, treatment options, and if necessary, the need for more tests with your health care provider. Vaccines  Your health care provider may recommend certain vaccines, such as: Influenza vaccine. This is recommended every year. Tetanus, diphtheria, and acellular pertussis (Tdap, Td) vaccine. You may need a Td booster every 10 years. Zoster vaccine. You may need this after age 61. Pneumococcal 13-valent conjugate (PCV13) vaccine. You may need this if you have certain conditions and have not been vaccinated. Pneumococcal polysaccharide (PPSV23) vaccine. You may need one or two doses if you smoke cigarettes or if you have certain conditions. Talk to your health care provider about which screenings and vaccines you need and how often you need them. This information is not intended to replace advice given to you by your health care provider. Make sure you discuss any questions you have with  your health care provider. Document Released:  04/18/2015 Document Revised: 12/10/2015 Document Reviewed: 01/21/2015 Elsevier Interactive Patient Education  2017 [place] Prevention in the Home Falls can cause injuries. They can happen to people of all ages. There are many things you can do to make your home safe and to help prevent falls. What can I do on the outside of my home? Regularly fix the edges of walkways and driveways and fix any cracks. Remove anything that might make you trip as you walk through a door, such as a raised step or threshold. Trim any bushes or trees on the path to your home. Use bright outdoor lighting. Clear any walking paths of anything that might make someone trip, such as rocks or tools. Regularly check to see if handrails are loose or broken. Make sure that both sides of any steps have handrails. Any raised decks and porches should have guardrails on the edges. Have any leaves, snow, or ice cleared regularly. Use sand or salt on walking paths during winter. Clean up any spills in your garage right away. This includes oil or grease spills. What can I do in the bathroom? Use night lights. Install grab bars by the toilet and in the tub and shower. Do not use towel bars as grab bars. Use non-skid mats or decals in the tub or shower. If you need to sit down in the shower, use a plastic, non-slip stool. Keep the floor dry. Clean up any water that spills on the floor as soon as it happens. Remove soap buildup in the tub or shower regularly. Attach bath mats securely with double-sided non-slip rug tape. Do not have throw rugs and other things on the floor that can make you trip. What can I do in the bedroom? Use night lights. Make sure that you have a light by your bed that is easy to reach. Do not use any sheets or blankets that are too big for your bed. They should not hang down onto the floor. Have a firm chair that has side arms. You can use this for support while you get dressed. Do not have  throw rugs and other things on the floor that can make you trip. What can I do in the kitchen? Clean up any spills right away. Avoid walking on wet floors. Keep items that you use a lot in easy-to-reach places. If you need to reach something above you, use a strong step stool that has a grab bar. Keep electrical cords out of the way. Do not use floor polish or wax that makes floors slippery. If you must use wax, use non-skid floor wax. Do not have throw rugs and other things on the floor that can make you trip. What can I do with my stairs? Do not leave any items on the stairs. Make sure that there are handrails on both sides of the stairs and use them. Fix handrails that are broken or loose. Make sure that handrails are as long as the stairways. Check any carpeting to make sure that it is firmly attached to the stairs. Fix any carpet that is loose or worn. Avoid having throw rugs at the top or bottom of the stairs. If you do have throw rugs, attach them to the floor with carpet tape. Make sure that you have a light switch at the top of the stairs and the bottom of the stairs. If you do not have them, ask someone to add them for you. What else  can I do to help prevent falls? Wear shoes that: Do not have high heels. Have rubber bottoms. Are comfortable and fit you well. Are closed at the toe. Do not wear sandals. If you use a stepladder: Make sure that it is fully opened. Do not climb a closed stepladder. Make sure that both sides of the stepladder are locked into place. Ask someone to hold it for you, if possible. Clearly mark and make sure that you can see: Any grab bars or handrails. First and last steps. Where the edge of each step is. Use tools that help you move around (mobility aids) if they are needed. These include: Canes. Walkers. Scooters. Crutches. Turn on the lights when you go into a dark area. Replace any light bulbs as soon as they burn out. Set up your furniture so  you have a clear path. Avoid moving your furniture around. If any of your floors are uneven, fix them. If there are any pets around you, be aware of where they are. Review your medicines with your doctor. Some medicines can make you feel dizzy. This can increase your chance of falling. Ask your doctor what other things that you can do to help prevent falls. This information is not intended to replace advice given to you by your health care provider. Make sure you discuss any questions you have with your health care provider. Document Released: 01/16/2009 Document Revised: 08/28/2015 Document Reviewed: 04/26/2014 Elsevier Interactive Patient Education  2017 [employer].

## 2021-04-22 NOTE — Progress Notes (Signed)
Virtual Visit via Telephone Note  I connected with  Scott [name] on 04/22/21 at 10:20 AM EST by telephone and verified that I am speaking with the correct person using two identifiers.  Location: Patient: home Provider: [facility] Persons participating in the virtual visit: [place]   I discussed the limitations, risks, security and privacy concerns of performing an evaluation and management service by telephone and the availability of in person appointments. The patient expressed understanding and agreed to proceed.  Interactive audio and video telecommunications were attempted between this nurse and patient, however failed, due to patient having technical difficulties OR patient did not have access to video capability.  We continued and completed visit with audio only.  Some vital signs may be absent or patient reported.   Dionisio David, LPN  Subjective:   Scott [name] is a 51 y.o. male who presents for Medicare Annual/Subsequent preventive examination.  Review of Systems           Objective:    There were no vitals filed for this visit. There is no height or weight on file to calculate BMI.  Advanced Directives 04/16/2020 12/29/2019 04/12/2019 04/10/2018 02/05/2018 02/04/2018 11/07/2014  Does Patient Have a Medical Advance Directive? No No No No No No No  Would patient like information on creating a medical advance directive? No - Patient declined No - Patient declined No - Patient declined - No - Patient declined - -    Current Medications (verified) Outpatient Encounter Medications as of 04/22/2021  Medication Sig   albuterol (VENTOLIN HFA) 108 (90 Base) MCG/ACT inhaler Inhale 2 puffs into the lungs every 4 (four) hours as needed for wheezing or shortness of breath.   ANDROGEL PUMP 20.25 MG/ACT (1.62%) GEL Apply 1 application topically daily.    bisoprolol (ZEBETA) 5 MG tablet Take 0.5 tablet (2.5 mg) by mouth once daily   blood glucose meter kit and supplies Use 1-2  times a day as needed.Dispense based on patient and insurance preference. (FOR ICD-10 E10.9, E11.9).   budesonide-formoterol (SYMBICORT) 80-4.5 MCG/ACT inhaler SMARTSIG:2 Puff(s) By Mouth Twice Daily   cabergoline (DOSTINEX) 0.5 MG tablet Take by mouth.   dapagliflozin propanediol (FARXIGA) 10 MG TABS tablet Take 10 mg by mouth daily.   glucose blood (ONETOUCH VERIO) test strip USE TO CHECK BLOOD SUGAR EVERY DAY   Lancets (ONETOUCH DELICA PLUS [id number]) MISC 1 each by Does not apply route daily.   Lancets (ONETOUCH ULTRASOFT) lancets Use with testing once daily and as neededUse as instructed   latanoprost (XALATAN) 0.005 % ophthalmic solution Place 1 drop into both eyes at bedtime.   nystatin ointment (MYCOSTATIN) Apply 1 application topically 2 (two) times daily.   PARoxetine (PAXIL) 40 MG tablet Take 1 tablet (40 mg total) by mouth daily.   potassium chloride SA (KLOR-CON M) 20 MEQ tablet TAKE 2 TABLETS BY MOUTH TWICE DAILY   Semaglutide (OZEMPIC, 0.25 OR 0.5 MG/DOSE, [place]) Inject 0.5 mg into the skin.   spironolactone (ALDACTONE) 50 MG tablet Take 1 tablet (50 mg total) by mouth daily.   torsemide (DEMADEX) 20 MG tablet TAKE 3 TABLETS(60 MG) BY MOUTH DAILY   No facility-administered encounter medications on file as of 04/22/2021.    Allergies (verified) Codeine   History: Past Medical History:  Diagnosis Date   Acid reflux    Anxiety    Arthritis    Asthma    CHF (congestive heart failure) (HCC)    Depression    Glaucoma  Hyperlipidemia    Hypertension    Klippel-Feil syndrome    Past Surgical History:  Procedure Laterality Date   COLOSTOMY     [place] SHUNT PLACEMENT     Family History  Problem Relation Age of Onset   Multiple sclerosis Mother    Colon cancer Father    Heart disease Father    Diabetes Sister    Lung cancer Maternal Grandmother    Thyroid cancer Maternal Grandfather    Ovarian cancer Paternal Grandmother    Cancer Paternal Grandfather    Social  History   Socioeconomic History   Marital status: Significant Other    Spouse name: Not on file   Number of children: 0   Years of education: Not on file   Highest education level: Some college, no degree  Occupational History   Occupation: disability  Tobacco Use   Smoking status: Former    Packs/day: 1.00    Years: 10.00    Pack years: 10.00    Types: Cigarettes    Quit date: 05/2006    Years since quitting: 14.9   Smokeless tobacco: Never   Tobacco comments:    quit 05/2006  Vaping Use   Vaping Use: Former   Devices: tried once  Substance and Sexual Activity   Alcohol use: No    Alcohol/week: 0.0 standard drinks   Drug use: No   Sexual activity: Not on file  Other Topics Concern   Not on file  Social History Narrative   Not on file   Social Determinants of Health   Financial Resource Strain: High Risk   Difficulty of Paying Living Expenses: Very hard  Food Insecurity: Not on file  Transportation Needs: Not on file  Physical Activity: Not on file  Stress: Not on file  Social Connections: Not on file    Tobacco Counseling Counseling given: Not Answered Tobacco comments: quit 05/2006   Clinical Intake:  Pre-visit preparation completed: Yes  Pain : No/denies pain     Nutritional Risks: None Diabetes: Yes CBG done?: No Did pt. bring in CBG monitor from home?: No  How often do you need to have someone help you when you read instructions, pamphlets, or other written materials from your doctor or pharmacy?: 1 - Never  Diabetic?yes Nutrition Risk Assessment:  Has the patient had any N/V/D within the last 2 months?  No  Does the patient have any non-healing wounds?  No  Has the patient had any unintentional weight loss or weight gain?  No   Diabetes:  Is the patient diabetic?  Yes  If diabetic, was a CBG obtained today?  No  Did the patient bring in their glucometer from home?  No  How often do you monitor your CBG's? daily.   Financial Strains  and Diabetes Management:  Are you having any financial strains with the device, your supplies or your medication? No .  Does the patient want to be seen by Chronic Care Management for management of their diabetes?  No  Would the patient like to be referred to a Nutritionist or for Diabetic Management?  No   Diabetic Exams:  Diabetic Eye Exam: Completed no. Overdue for diabetic eye exam. Pt has been advised about the importance in completing this exam. Declined referral Diabetic Foot Exam: Completed 03/07/20. Pt has been advised about the importance in completing this exam.   Interpreter Needed?: No  Information entered by :: Kirke Shaggy, LPN   Activities of Daily Living In your present state  of health, do you have any difficulty performing the following activities: 03/16/2021  Hearing? Y  Vision? Y  Difficulty concentrating or making decisions? Y  Comment making decisions  Walking or climbing stairs? Y  Dressing or bathing? Y  Doing errands, shopping? Y  Some recent data might be hidden    Patient Care Team: Mikey Kirschner, PA-C as PCP - General (Physician Assistant) Minna Merritts, MD as PCP - Cardiology (Cardiology) Alisa Graff, [place] (Family Medicine) Ronnald Collum, Lourdes Sledge, MD as Attending Physician (Endocrinology) Neldon Labella, RN as Case Manager Lorelee Cover., MD (Ophthalmology) Gardiner Barefoot, DPM as Consulting Physician (Podiatry)  Indicate any recent Medical Services you may have received from other than [facility] providers in the past year (date may be approximate).     Assessment:   This is a routine wellness examination for Scott [name].  Hearing/Vision screen No results found.  Dietary issues and exercise activities discussed:     Goals Addressed   None    Depression Screen PHQ 2/9 Scores 03/16/2021 05/16/2020 04/16/2020 04/12/2019 04/10/2018 02/20/2018 02/20/2018  PHQ - 2 Score '6 5 6 6 2 2 2  ' PHQ- 9 Score 18 20 - '18 6 2 ' -  Exception Documentation - -  Patient refusal - - Other- indicate reason in comment box -  Not completed - - - - - (No Data) -    Fall Risk Fall Risk  01/15/2021 05/16/2020 04/16/2020 04/12/2019 06/21/2018  Falls in the past year? '1 1 1 ' 0 0  Number falls in past yr: '1 1 1 ' 0 0  Injury with Fall? 1 1 0 0 0  Risk for fall due to : History of fall(s);Impaired balance/gait;Medication side effect - Impaired balance/gait;Other (Comment) - -  Risk for fall due to: Comment - - due to respiratory issues - -  Follow up Falls prevention discussed - Falls prevention discussed - -    FALL RISK PREVENTION PERTAINING TO THE HOME:  Any stairs in or around the home? No  If so, are there any without handrails? No  Home free of loose throw rugs in walkways, pet beds, electrical cords, etc? Yes  Adequate lighting in your home to reduce risk of falls? Yes   ASSISTIVE DEVICES UTILIZED TO PREVENT FALLS:  Life alert? No  Use of a cane, walker or w/c? No  Grab bars in the bathroom? Yes  Shower chair or bench in shower? No  Elevated toilet seat or a handicapped toilet? No    Cognitive Function:Normal cognitive status assessed by direct observation by this Nurse Health Advisor. No abnormalities found.       6CIT Screen 04/10/2018  What Year? 0 points  What month? 0 points  What time? 0 points  Count back from 20 0 points  Months in reverse 0 points  Repeat phrase 0 points  Total Score 0    Immunizations Immunization History  Administered Date(s) Administered   Moderna Sars-Covid-2 Vaccination 07/25/2020   PFIZER(Purple Top)SARS-COV-2 Vaccination 06/23/2019, 07/18/2019   Tdap 02/20/2018    TDAP status: Up to date  Flu Vaccine status: Declined, Education has been provided regarding the importance of this vaccine but patient still declined. Advised may receive this vaccine at local pharmacy or Health Dept. Aware to provide a copy of the vaccination record if obtained from local pharmacy or Health Dept. Verbalized acceptance and  understanding.  Pneumococcal vaccine status: Declined,  Education has been provided regarding the importance of this vaccine but patient still declined. Advised may receive this  vaccine at local pharmacy or Health Dept. Aware to provide a copy of the vaccination record if obtained from local pharmacy or Health Dept. Verbalized acceptance and understanding.   Covid-19 vaccine status: Completed vaccines  Qualifies for Shingles Vaccine? Yes   Zostavax completed No   Shingrix Completed?: No.    Education has been provided regarding the importance of this vaccine. Patient has been advised to call insurance company to determine out of pocket expense if they have not yet received this vaccine. Advised may also receive vaccine at local pharmacy or Health Dept. Verbalized acceptance and understanding.  Screening Tests Health Maintenance  Topic Date Due   Pneumococcal Vaccine 52-52 Years old (1 - PCV) Never done   OPHTHALMOLOGY EXAM  Never done   URINE MICROALBUMIN  Never done   Zoster Vaccines- Shingrix (1 of 2) Never done   COLONOSCOPY (Pts 45-83yr Insurance coverage will need to be confirmed)  Never done   COVID-19 Vaccine (4 - Booster) 09/19/2020   INFLUENZA VACCINE  Never done   HEMOGLOBIN A1C  02/15/2021   FOOT EXAM  03/07/2021   TETANUS/TDAP  02/21/2028   HIV Screening  Completed   HPV VACCINES  Aged Out    Health Maintenance  Health Maintenance Due  Topic Date Due   Pneumococcal Vaccine 143648Years old (1 - PCV) Never done   OPHTHALMOLOGY EXAM  Never done   URINE MICROALBUMIN  Never done   Zoster Vaccines- Shingrix (1 of 2) Never done   COLONOSCOPY (Pts 45-420yrInsurance coverage will need to be confirmed)  Never done   COVID-19 Vaccine (4 - Booster) 09/19/2020   INFLUENZA VACCINE  Never done   HEMOGLOBIN A1C  02/15/2021   FOOT EXAM  03/07/2021    Colorectal cancer screening: Type of screening: Colonoscopy. Completed no. Repeat every 10 years- declined referral  Lung  Cancer Screening: (Low Dose CT Chest recommended if Age 51-80ears, 30 pack-year currently smoking OR have quit w/in 15years.) does not qualify.     Additional Screening:  Hepatitis C Screening: does qualify; Completed no  Vision Screening: Recommended annual ophthalmology exams for early detection of glaucoma and other disorders of the eye. Is the patient up to date with their annual eye exam?  Yes  Who is the provider or what is the name of the office in which the patient attends annual eye exams? Dr. BeGloriann Loanf pt is not established with a provider, would they like to be referred to a provider to establish care? No .   Dental Screening: Recommended annual dental exams for proper oral hygiene  Community Resource Referral / Chronic Care Management: CRR required this visit?  Yes   CCM required this visit?  No      Plan:     I have personally reviewed and noted the following in the patients chart:   Medical and social history Use of alcohol, tobacco or illicit drugs  Current medications and supplements including opioid prescriptions. Patient is not currently taking opioid prescriptions. Functional ability and status Nutritional status Physical activity Advanced directives List of other physicians Hospitalizations, surgeries, and ER visits in previous 12 months Vitals Screenings to include cognitive, depression, and falls Referrals and appointments  In addition, I have reviewed and discussed with patient certain preventive protocols, quality metrics, and best practice recommendations. A written personalized care plan for preventive services as well as general preventive health recommendations were provided to patient.     LoDionisio DavidLPN   04/10/15/6160  Nurse Notes: none

## 2021-04-24 ENCOUNTER — Other Ambulatory Visit: Payer: Self-pay

## 2021-04-24 ENCOUNTER — Encounter: Payer: Self-pay | Admitting: Family Medicine

## 2021-04-24 ENCOUNTER — Ambulatory Visit (INDEPENDENT_AMBULATORY_CARE_PROVIDER_SITE_OTHER): Payer: Medicare Other | Admitting: Family Medicine

## 2021-04-24 VITALS — BP 93/63 | Temp 84.0°F | Wt 197.0 lb

## 2021-04-24 DIAGNOSIS — Z1211 Encounter for screening for malignant neoplasm of colon: Secondary | ICD-10-CM

## 2021-04-24 DIAGNOSIS — I10 Essential (primary) hypertension: Secondary | ICD-10-CM | POA: Diagnosis not present

## 2021-04-24 DIAGNOSIS — E782 Mixed hyperlipidemia: Secondary | ICD-10-CM

## 2021-04-24 DIAGNOSIS — N644 Mastodynia: Secondary | ICD-10-CM

## 2021-04-24 DIAGNOSIS — F32A Depression, unspecified: Secondary | ICD-10-CM

## 2021-04-24 DIAGNOSIS — E119 Type 2 diabetes mellitus without complications: Secondary | ICD-10-CM

## 2021-04-24 DIAGNOSIS — L219 Seborrheic dermatitis, unspecified: Secondary | ICD-10-CM

## 2021-04-24 MED ORDER — CLOTRIMAZOLE-BETAMETHASONE 1-0.05 % EX CREA: 1.0000 "application " | TOPICAL_CREAM | Freq: Every day | CUTANEOUS | 0 refills | Status: DC

## 2021-04-24 NOTE — Patient Instructions (Signed)
.   Please review the attached list of medications and notify my office if there are any errors.   . Please bring all of your medications to every appointment so we can make sure that our medication list is the same as yours.   

## 2021-04-24 NOTE — Progress Notes (Signed)
Established patient visit   Patient: Scott [name]   DOB: 06-29-1970   51 y.o. Male  MRN: [id number] Visit Date: 04/24/2021  Today's healthcare provider: Lelon Huh, MD   No chief complaint on file.  Subjective    HPI   Hypertension, follow-up  BP Readings from Last 3 Encounters:  04/24/21 93/63  03/16/21 121/75  02/03/21 110/80   Wt Readings from Last 3 Encounters:  04/24/21 197 lb (89.4 kg)  03/16/21 204 lb 12.8 oz (92.9 kg)  02/03/21 210 lb (95.3 kg)     He was last seen for hypertension 8 months ago.  BP at that visit was 137/89. Management since that visit includes no changes.  He reports excellent compliance with treatment. He is not having side effects.  He is following a Regular diet. He is exercising. He does not smoke.  Use of agents associated with hypertension: none.   Outside blood pressures are 120's/80's.   --------------------------------------------------------------------------------------------------- Lipid/Cholesterol, Follow-up  Last lipid panel Other pertinent labs  Lab Results  Component Value Date   CHOL 211 (H) 08/15/2020   HDL 75 08/15/2020   LDLCALC 116 (H) 08/15/2020   TRIG 115 08/15/2020   CHOLHDL 3.3 07/01/2020   Lab Results  Component Value Date   ALT 20 08/15/2020   AST 15 08/15/2020   PLT 331 08/15/2020   TSH 1.400 08/15/2020     He was last seen for this 8 months ago.  Management since that visit includes no changes.  He reports excellent compliance with treatment. He is not having side effects.   Symptoms: No chest pain No chest pressure/discomfort  No dyspnea No lower extremity edema  No numbness or tingling of extremity No orthopnea  No palpitations No paroxysmal nocturnal dyspnea  No speech difficulty No syncope     The ASCVD Risk score (Arnett DK, et al., 2019) failed to calculate for the following reasons:   Unable to determine if patient is Non-Hispanic African  American  ---------------------------------------------------------------------------------------------------     Medications: Outpatient Medications Prior to Visit  Medication Sig   albuterol (VENTOLIN HFA) 108 (90 Base) MCG/ACT inhaler Inhale 2 puffs into the lungs every 4 (four) hours as needed for wheezing or shortness of breath.   ANDROGEL PUMP 20.25 MG/ACT (1.62%) GEL Apply 1 application topically daily.    bisoprolol (ZEBETA) 5 MG tablet Take 0.5 tablet (2.5 mg) by mouth once daily   blood glucose meter kit and supplies Use 1-2 times a day as needed.Dispense based on patient and insurance preference. (FOR ICD-10 E10.9, E11.9).   budesonide-formoterol (SYMBICORT) 80-4.5 MCG/ACT inhaler    cabergoline (DOSTINEX) 0.5 MG tablet Take by mouth.   dapagliflozin propanediol (FARXIGA) 10 MG TABS tablet Take 10 mg by mouth daily.   glucose blood (ONETOUCH VERIO) test strip USE TO CHECK BLOOD SUGAR EVERY DAY   Lancets (ONETOUCH DELICA PLUS [id number]) MISC 1 each by Does not apply route daily.   Lancets (ONETOUCH ULTRASOFT) lancets Use with testing once daily and as neededUse as instructed   latanoprost (XALATAN) 0.005 % ophthalmic solution Place 1 drop into both eyes at bedtime.   nystatin ointment (MYCOSTATIN) Apply 1 application topically 2 (two) times daily.   PARoxetine (PAXIL) 40 MG tablet Take 1 tablet (40 mg total) by mouth daily.   potassium chloride SA (KLOR-CON M) 20 MEQ tablet TAKE 2 TABLETS BY MOUTH TWICE DAILY   Semaglutide (OZEMPIC, 0.25 OR 0.5 MG/DOSE, [place]) Inject 0.5 mg into the skin.  spironolactone (ALDACTONE) 50 MG tablet Take 1 tablet (50 mg total) by mouth daily.   torsemide (DEMADEX) 20 MG tablet TAKE 3 TABLETS(60 MG) BY MOUTH DAILY   No facility-administered medications prior to visit.    Review of Systems  Constitutional:  Positive for appetite change (Pt states he does not have an appetite since starting ozempic.). Negative for activity change, chills,  diaphoresis, fatigue, fever and unexpected weight change.  Respiratory:  Positive for shortness of breath (Chronic issue pt states this is stable.). Negative for apnea, cough, choking, chest tightness, wheezing and stridor.   Cardiovascular: Negative.   Gastrointestinal: Negative.   Neurological:  Negative for dizziness, light-headedness and headaches.      Objective    BP 93/63 (BP Location: Left Arm, Patient Position: Sitting, Cuff Size: Large)    Temp (!) 84 F (28.9 C) (Oral)    Wt 197 lb (89.4 kg)    SpO2 99% Comment: 3 liters of O2   BMI 36.03 kg/m    Physical Exam   General: Appearance:    Obese male in no acute distress  Eyes:    PERRL, conjunctiva/corneas clear, EOM's intact       Lungs:     Clear to auscultation bilaterally, respirations unlabored  Breast:   Tender area of fullness in left retroareolar area which he states has been there for a few months.   Heart:    Normal heart rate. Normal rhythm. No murmurs, rubs, or gallops.    MS:   All extremities are intact.    Skin:   Flaking seborrheic dermatitis behind his ears.          Assessment & Plan     1. Essential hypertension BP is low today, but usually in the 120s to 130s. Continue current medications.    2. Diabetes mellitus without complication ([place]) Followed by Dr. Carmela Rima. Sent for copy of his most recent records.   3. Mixed hyperlipidemia Need copy of most recent labs done by Dr. Carmela Rima. Consider starting statin.   4. Screening for colon cancer  - Ambulatory referral to Gastroenterology  5. Seborrheic dermatitis Behind his left ear.  - clotrimazole-betamethasone (LOTRISONE) cream; Apply 1 application topically daily.  Dispense: 30 g; Refill: 0  6. Breast pain, left  - US BREAST LTD UNI LEFT INC AXILLA; Future  7. Depression, unspecified depression type Doing very well on paroxetine and he wishes to continue same dose of paroxetine.     He is a former patient of [name] and will follow  up and establish care with Mikey Kirschner in 6 months.      The entirety of the information documented in the History of Present Illness, Review of Systems and Physical Exam were personally obtained by me. Portions of this information were initially documented by the CMA and reviewed by me for thoroughness and accuracy.     Lelon Huh, MD  [facility] [phone] (phone) [phone] (fax)  [place]

## 2021-04-28 ENCOUNTER — Telehealth: Payer: Self-pay

## 2021-04-28 DIAGNOSIS — N644 Mastodynia: Secondary | ICD-10-CM

## 2021-04-28 NOTE — Telephone Encounter (Signed)
Copied from [place] [id number]. Topic: General - Other >> Apr 28, 2021 11:56 AM Parke Poisson wrote: Reason for CRM: [facility] states that they will need an order for diagnostic bilateral mammogram TOMO [id number] before appointment can be scheduled,Thanks

## 2021-04-28 NOTE — Addendum Note (Signed)
Addended by: Birdie Sons on: 04/28/2021 05:24 PM   Modules accepted: Orders

## 2021-05-05 ENCOUNTER — Inpatient Hospital Stay: Admission: RE | Admit: 2021-05-05 | Payer: Medicare Other | Source: Ambulatory Visit

## 2021-05-05 ENCOUNTER — Other Ambulatory Visit: Payer: Medicare Other

## 2021-05-12 ENCOUNTER — Telehealth: Payer: Self-pay | Admitting: *Deleted

## 2021-05-12 ENCOUNTER — Telehealth: Payer: Medicare Other | Admitting: *Deleted

## 2021-05-12 NOTE — Telephone Encounter (Signed)
°  Care Management   Follow Up Note   05/12/2021 Name: EMIDIO WARRELL MRN: [id number] DOB: 1970-07-08   Referred by: Mikey Kirschner, PA-C Reason for referral : Care Coordination   An unsuccessful telephone outreach was attempted today. The patient was referred to the case management team for assistance with care management and care coordination.   Follow Up Plan: Telephone follow up appointment with care management team member to be re-scheduled by Care Guide.   [name], [place] Worker  [facility] Care Management [phone]

## 2021-05-13 ENCOUNTER — Telehealth: Payer: Self-pay | Admitting: *Deleted

## 2021-05-13 NOTE — Chronic Care Management (AMB) (Signed)
°  Care Management   Note  05/13/2021 Name: TAO SATZ MRN: [id number] DOB: 08-Dec-1970  RAYDAN SCHLABACH is a 51 y.o. year old male who is a primary care patient of Thedore Mins, Delman Cheadle and is actively engaged with the care management team. I reached out to [name] by phone today to assist with re-scheduling a initial visit with the Licensed Clinical Social Worker  Follow up plan: Unsuccessful telephone outreach attempt made. A HIPAA compliant phone message was left for the patient providing contact information and requesting a return call.   Julian Hy, [place] Management  Direct Dial: [phone]

## 2021-05-15 ENCOUNTER — Inpatient Hospital Stay: Admission: RE | Admit: 2021-05-15 | Payer: Medicare Other | Source: Ambulatory Visit

## 2021-05-15 ENCOUNTER — Other Ambulatory Visit: Payer: Medicare Other

## 2021-05-27 NOTE — Chronic Care Management (AMB) (Signed)
°  Care Management   Note  05/27/2021 Name: Scott [name] MRN: [id number] DOB: 1971-02-12  Scott [name] is a 51 y.o. year old male who is a primary care patient of Thedore Mins, Delman Cheadle and is actively engaged with the care management team. I reached out to [name] by phone today to assist with re-scheduling an initial visit with the Licensed Clinical Social Worker  Follow up plan: Telephone appointment with care management team member scheduled for: 06/09/2021  Julian Hy, [place], [place] Management  Direct Dial: [phone]

## 2021-05-28 ENCOUNTER — Other Ambulatory Visit: Payer: Self-pay

## 2021-05-28 ENCOUNTER — Ambulatory Visit
Admission: RE | Admit: 2021-05-28 | Discharge: 2021-05-28 | Disposition: A | Discharge status: Home / Self Care | Payer: Medicare Other | Source: Ambulatory Visit | Attending: Family Medicine | Admitting: Family Medicine

## 2021-05-28 DIAGNOSIS — N644 Mastodynia: Secondary | ICD-10-CM | POA: Diagnosis present

## 2021-06-03 ENCOUNTER — Telehealth: Payer: Self-pay | Admitting: Family Medicine

## 2021-06-03 NOTE — Telephone Encounter (Signed)
Home Health Verbal Orders - Caller/Agency: Maggie RN [facility]  ?Callback Number: [phone]  ?Requesting OT/PT/Skilled Nursing/Social Work/Speech Therapy: Nursing  ?Frequency:  ? ?Helping to manage congestive heart failure and diabetes ? ?1w3 ? ?1 every 2 weeks for 6 weeks  ? ?Secured VM available - please advise  ?

## 2021-06-04 ENCOUNTER — Encounter: Payer: Self-pay | Admitting: Family Medicine

## 2021-06-04 NOTE — Telephone Encounter (Signed)
That's fine

## 2021-06-04 NOTE — Telephone Encounter (Signed)
I returned Maggie's call and gave "okay" for requested verbal orders stated below. ?

## 2021-06-08 DIAGNOSIS — E119 Type 2 diabetes mellitus without complications: Secondary | ICD-10-CM | POA: Diagnosis not present

## 2021-06-08 DIAGNOSIS — I5031 Acute diastolic (congestive) heart failure: Secondary | ICD-10-CM | POA: Diagnosis not present

## 2021-06-08 DIAGNOSIS — I110 Hypertensive heart disease with heart failure: Secondary | ICD-10-CM | POA: Diagnosis not present

## 2021-06-08 DIAGNOSIS — E785 Hyperlipidemia, unspecified: Secondary | ICD-10-CM

## 2021-06-08 DIAGNOSIS — H409 Unspecified glaucoma: Secondary | ICD-10-CM

## 2021-06-08 DIAGNOSIS — E291 Testicular hypofunction: Secondary | ICD-10-CM

## 2021-06-08 DIAGNOSIS — E6609 Other obesity due to excess calories: Secondary | ICD-10-CM

## 2021-06-08 DIAGNOSIS — J45909 Unspecified asthma, uncomplicated: Secondary | ICD-10-CM | POA: Diagnosis not present

## 2021-06-08 DIAGNOSIS — Q761 Klippel-Feil syndrome: Secondary | ICD-10-CM

## 2021-06-08 DIAGNOSIS — F4322 Adjustment disorder with anxiety: Secondary | ICD-10-CM

## 2021-06-09 ENCOUNTER — Telehealth: Payer: Self-pay | Admitting: *Deleted

## 2021-06-09 ENCOUNTER — Telehealth: Payer: Self-pay | Admitting: Family Medicine

## 2021-06-09 ENCOUNTER — Telehealth: Payer: Medicare Other | Admitting: *Deleted

## 2021-06-09 NOTE — Telephone Encounter (Signed)
FYI/ Merry Proud called to report Pt decline his nursing visit today / so he'll be missing a visit for the week ?

## 2021-06-09 NOTE — Telephone Encounter (Signed)
?  Care Management  ? ?Follow Up Note ? ? ?06/09/2021 ?Name: Scott [name] MRN: [id number] DOB: [birth year] ? ? ?Referred by: Birdie Sons, MD ?Reason for referral : Care Coordination ? ? ?A second unsuccessful telephone outreach was attempted today. The patient was referred to the case management team for assistance with care management and care coordination.  ? ?Follow Up Plan: Telephone follow up appointment with care management team member to be re-scheduled by Careguide: ? ? ?Araceli Arango, LCSW ?Clinical Social Worker  ?[place] Management ?[phone] ? ? ?

## 2021-06-10 ENCOUNTER — Telehealth: Payer: Self-pay | Admitting: *Deleted

## 2021-06-10 NOTE — Chronic Care Management (AMB) (Signed)
?  Care Management  ? ?Note ? ?06/10/2021 ?Name: Scott [name] MRN: [id number] DOB: Sep 16, 1970 ? ?Scott [name] is a 51 y.o. year old male who is a primary care patient of Caryn Section, Kirstie Peri, MD and is actively engaged with the care management team. I reached out to [name] by phone today to assist with re-scheduling an initial visit with the Licensed Clinical Social Worker ? ?Follow up plan: ?Unsuccessful telephone outreach attempt made. A HIPAA compliant phone message was left for the patient providing contact information and requesting a return call.  ? ?Genecis Veley, CCMA ?Care Guide, Embedded Care Coordination ?[facility]  [facility] Management  ?Direct Dial: [phone] ? ? ?

## 2021-06-25 ENCOUNTER — Telehealth: Payer: Self-pay

## 2021-06-25 NOTE — Telephone Encounter (Signed)
Copied from [facility] [phone]. Topic: Quick Communication - Home Health Verbal Orders ?>> Jun 25, 2021  2:04 PM Pawlus, Brayton Layman A wrote: ?Caller/Agency: Merry Proud RN home health  ?Callback Number: [phone] ?Requesting Education officer, museum / community resources ?

## 2021-06-26 NOTE — Telephone Encounter (Signed)
Merry Proud with home health advised. ?

## 2021-06-26 NOTE — Telephone Encounter (Signed)
That's fine

## 2021-06-29 ENCOUNTER — Other Ambulatory Visit: Payer: Self-pay | Admitting: Cardiovascular Disease

## 2021-06-30 NOTE — Chronic Care Management (AMB) (Signed)
?  Care Management  ? ?Note ? ?06/30/2021 ?Name: Scott [name] MRN: [id number] DOB: 28-May-1970 ? ?Scott [name] is a 51 y.o. year old male who is a primary care patient of Caryn Section, Kirstie Peri, MD and is actively engaged with the care management team. I reached out to [name] by phone today to assist with re-scheduling an initial visit with the Licensed Clinical Social Worker ? ?Follow up plan: ?2nd Unsuccessful telephone outreach attempt made. A HIPAA compliant phone message was left for the patient providing contact information and requesting a return call.  ? ?Adrina Armijo, CCMA ?Care Guide, Embedded Care Coordination ?[facility]  [facility] Management  ?Direct Dial: [phone] ? ? ?

## 2021-07-02 ENCOUNTER — Telehealth: Payer: Self-pay | Admitting: Family Medicine

## 2021-07-02 NOTE — Telephone Encounter (Signed)
Margaret from [facility] called in states termination home health care, because patient is actively working doing deliveries. ?

## 2021-07-21 NOTE — Chronic Care Management (AMB) (Addendum)
?  Care Management  ? ?Note ? ?07/21/2021 ?Name: HAWKIN CHARO MRN: [id number] DOB: 06-07-1970 ? ?HAZAIAH EDGECOMBE is a 51 y.o. year old male who is a primary care patient of Caryn Section, Kirstie Peri, MD and is actively engaged with the care management team. I reached out to [name] by phone today to assist with re-scheduling an initial visit with the Licensed Clinical Social Worker ? ?Follow up plan: ?Telephone appointment with care management team member scheduled for: 07/28/2021 ? ?Cayne Yom, CCMA ?Care Guide, Embedded Care Coordination ?[place]  Care Management  ?Direct Dial: [phone] ? ? ?

## 2021-07-28 ENCOUNTER — Ambulatory Visit (INDEPENDENT_AMBULATORY_CARE_PROVIDER_SITE_OTHER): Payer: Medicare Other | Admitting: *Deleted

## 2021-07-28 DIAGNOSIS — F32A Depression, unspecified: Secondary | ICD-10-CM

## 2021-07-28 DIAGNOSIS — F419 Anxiety disorder, unspecified: Secondary | ICD-10-CM

## 2021-07-28 NOTE — Patient Instructions (Addendum)
Visit Information ? ?Thank you for taking time to visit with me today. Please don't hesitate to contact me if I can be of assistance to you before our next scheduled telephone appointment. ? ?Following are the goals we discussed today:  ?- begin personal counseling ?- talk about feelings with a friend, family or spiritual advisor ?- practice positive thinking and self-talk ? ?Our next appointment is by telephone on 08/11/21 at 10am ? ?Please call the care guide team at [phone] if you need to cancel or reschedule your appointment.  ? ?If you are experiencing a Mental Health or [place] or need someone to talk to, please call the Suicide and Crisis Lifeline: 988  ? ?Following is a copy of your full plan of care:  ?Care Plan : General Social Work (Adult)  ?Updates made by Vern Claude, LCSW since 07/28/2021 12:00 AM  ?  ? ?Problem: CHL AMB "PATIENT-SPECIFIC PROBLEM"   ?Note:   ?CARE PLAN ENTRY ?(see longitudinal plan of care for additional care plan information) ? ?Current Barriers:  ?Knowledge deficits related to accessing mental health provider in patient with Anxiety and Depression  ?Patient is experiencing symptoms of  depression and anxiety which seem to be exacerbated by conflictual relationship with spouse.    ?Patient needs Support, Education, and Care Coordination in order to meet unmet mental health needs  ?Mental Health Concerns  and Family and relationship dysfunction ? ?Clinical Social Work Goal(s):  ?Over the next 90 days, patient will work with SW bi-weekly by telephone or in person to reduce or manage symptoms of anxiety and depression until connected for ongoing counseling resources.  ?Patient will implement clinical interventions discussed today to decreases symptoms of anxiety and depression and increase knowledge and/or ability of: self-management skills. ? ?Interventions:  ?Assessed patient's understanding, education, previous treatment and care coordination needs  ?Patient  interviewed and appropriate assessments performed: PHQ 2 ?Provided basic mental health support, education and interventions  ?Challenges with relationship with significant other discussed, current network of support explored ?Encouraged patient to verbalize his feelings regarding relationship conflicts ?Patient provided with validation and encouragement-positive coping strategies explored ?Collaborated with appropriate clinical care team members regarding patient needs ?Discussed several options for long term counseling based on need and insurance. Will plan to assist patient with narrowing the options once received. ?Reviewed mental health medications with patient prescribed by PCP and discussed compliance  ?Other interventions include: Motivational Interviewing employed ?Active listening / Reflection utilized  ?Emotional Support Provided  ? ?Patient Self Care Activities & Deficits:  ?Patient is unable to independently navigate community resource options without care coordination support ?Patient is able to implement clinical interventions discussed today and is motivated for treatment  ?Patient will select one of the agencies from the list provided and call to schedule an appointment  ?Performs ADL's independently ?Performs IADL's independently ? ?Initial goal documentation ? ?  ? ? ?Mr. Kissler was given information about Care Management services by the embedded care coordination team including:  ?Care Management services include personalized support from designated clinical staff supervised by his physician, including individualized plan of care and coordination with other care providers ?24/7 contact phone numbers for assistance for urgent and routine care needs. ?The patient may stop CCM services at any time (effective at the end of the month) by phone call to the office staff. ? ?Patient agreed to services and verbal consent obtained.  ? ?Patient verbalizes understanding of instructions and care plan provided  today and agrees to view  in [place]. Active MyChart status confirmed with patient.   ? ?Telephone follow up appointment with care management team member scheduled for: 08/11/21 ? ? ?Averie Hornbaker, LCSW ?Clinical Social Worker  ?[facility] ?[phone] ? ? ?  ?

## 2021-07-28 NOTE — Chronic Care Management (AMB) (Signed)
Care Management ?Clinical Social Work Note ? ?07/28/2021 ?Name: Scott [name] MRN: [id number] DOB: June 26, 1970 ? ?Scott [name] is a 51 y.o. year old male who is a primary care patient of [name], Scott Peri, MD.  The Care Management team was consulted for assistance with chronic disease management and coordination needs. ? ?Engaged with patient by telephone for follow up visit in response to provider referral for social work chronic care management and care coordination services ? ?Consent to Services:  ?Scott [name] was given information about Care Management services today including:  ?Care Management services includes personalized support from designated clinical staff supervised by his physician, including individualized plan of care and coordination with other care providers ?24/7 contact phone numbers for assistance for urgent and routine care needs. ?The patient may stop case management services at any time by phone call to the office staff. ? ?Patient agreed to services and consent obtained.  ? ?Assessment: Review of patient past medical history, allergies, medications, and health status, including review of relevant consultants reports was performed today as part of a comprehensive evaluation and provision of chronic care management and care coordination services. ? ?SDOH (Social Determinants of Health) assessments and interventions performed:  ?SDOH Interventions   ? ?Flowsheet Row Most Recent Value  ?SDOH Interventions   ?SDOH Interventions for the Following Domains Depression  ?Depression Interventions/Treatment  Counseling  ? ?  ?  ? ?Advanced Directives Status: Not addressed in this encounter. ? ?Care Plan ? ?Allergies  ?Allergen Reactions  ? Codeine Shortness Of Breath  ? ? ?Outpatient Encounter Medications as of 07/28/2021  ?Medication Sig Note  ? albuterol (VENTOLIN HFA) 108 (90 Base) MCG/ACT inhaler Inhale 2 puffs into the lungs every 4 (four) hours as needed for wheezing or shortness of breath.   ? ANDROGEL PUMP  20.25 MG/ACT (1.62%) GEL Apply 1 application topically daily.    ? bisoprolol (ZEBETA) 5 MG tablet Take 0.5 tablet (2.5 mg) by mouth once daily   ? blood glucose meter kit and supplies Use 1-2 times a day as needed.Dispense based on patient and insurance preference. (FOR ICD-10 E10.9, E11.9).   ? budesonide-formoterol (SYMBICORT) 80-4.5 MCG/ACT inhaler    ? cabergoline (DOSTINEX) 0.5 MG tablet Take by mouth.   ? clotrimazole-betamethasone (LOTRISONE) cream Apply 1 application topically daily.   ? dapagliflozin propanediol (FARXIGA) 10 MG TABS tablet Take 10 mg by mouth daily.   ? glucose blood (ONETOUCH VERIO) test strip USE TO CHECK BLOOD SUGAR EVERY DAY   ? Lancets (ONETOUCH DELICA PLUS [id number]) MISC 1 each by Does not apply route daily.   ? Lancets (ONETOUCH ULTRASOFT) lancets Use with testing once daily and as neededUse as instructed   ? latanoprost (XALATAN) 0.005 % ophthalmic solution Place 1 drop into both eyes at bedtime.   ? nystatin ointment (MYCOSTATIN) Apply 1 application topically 2 (two) times daily.   ? PARoxetine (PAXIL) 40 MG tablet Take 1 tablet (40 mg total) by mouth daily.   ? potassium chloride SA (KLOR-CON M) 20 MEQ tablet TAKE 2 TABLETS BY MOUTH TWICE DAILY   ? Semaglutide (OZEMPIC, 0.25 OR 0.5 MG/DOSE, ) Inject 0.5 mg into the skin.   ? spironolactone (ALDACTONE) 50 MG tablet Take 1 tablet (50 mg total) by mouth daily. 03/19/2021: Reports dose was changed to twice a day.  ? torsemide (DEMADEX) 20 MG tablet TAKE 3 TABLETS(60 MG) BY MOUTH DAILY   ? ?No facility-administered encounter medications on file as of 07/28/2021.  ? ? ?Patient Active  Problem List  ? Diagnosis Date Noted  ? Fungal rash of right side face.  05/19/2020  ? Fall from slipping on ice 05/19/2020  ? Pain due to onychomycosis of toenails of both feet 03/13/2020  ? Diabetes mellitus without complication ([place]) 50/06/7046  ? History of bradycardia 02/13/2020  ? COPD exacerbation ([place]) 12/30/2019  ? Depression 12/30/2019  ? Acute  renal failure superimposed on stage 2 chronic kidney disease ([place]) 12/30/2019  ? Chronic diastolic heart failure ([place]) 02/16/2018  ? Accelerated hypertension 02/05/2018  ? Acute diastolic heart failure ([place])   ? Kyphoscoliosis and scoliosis 01/30/2018  ? Erectile dysfunction 09/09/2016  ? Disorder of bursae of shoulder region 09/07/2016  ? Impingement syndrome of shoulder region 09/07/2016  ? Anxiety 09/11/2014  ? Airway hyperreactivity 09/11/2014  ? CN (constipation) 09/11/2014  ? Alopecia, male pattern 09/11/2014  ? Decreased libido 09/11/2014  ? Dysfunction of eustachian tube 09/11/2014  ? Groin strain 09/11/2014  ? Cephalalgia 09/11/2014  ? Umbilical hernia 88/91/6945  ? Essential hypertension 09/11/2014  ? Migraine 09/11/2014  ? Neoplasm of uncertain behavior 03/88/8280  ? Hernia of anterior abdominal wall 09/11/2014  ? Contact dermatitis due to Genus Toxicodendron 09/11/2014  ? Open-angle glaucoma 05/20/2009  ? Congenital hydrocephalus ([place]) 05/20/2009  ? Klippel-Feil syndrome 05/20/2009  ? HLD (hyperlipidemia) 03/15/2008  ? Clinical depression 06/13/2007  ? ? ?Conditions to be addressed/monitored: Anxiety and Depression; Mental Health Concerns  ? ?Care Plan : General Social Work (Adult)  ?Updates made by Vern Claude, LCSW since 07/28/2021 12:00 AM  ?  ? ?Problem: CHL AMB "PATIENT-SPECIFIC PROBLEM"   ?Note:   ?CARE PLAN ENTRY ?(see longitudinal plan of care for additional care plan information) ? ?Current Barriers:  ?Knowledge deficits related to accessing mental health provider in patient with Anxiety and Depression  ?Patient is experiencing symptoms of  depression and anxiety which seem to be exacerbated by conflictual relationship with spouse.    ?Patient needs Support, Education, and Care Coordination in order to meet unmet mental health needs  ?Mental Health Concerns  and Family and relationship dysfunction ? ?Clinical Social Work Goal(s):  ?Over the next 90 days, patient will work with SW bi-weekly  by telephone or in person to reduce or manage symptoms of anxiety and depression until connected for ongoing counseling resources.  ?Patient will implement clinical interventions discussed today to decreases symptoms of anxiety and depression and increase knowledge and/or ability of: self-management skills. ? ?Interventions:  ?Assessed patient's understanding, education, previous treatment and care coordination needs  ?Patient interviewed and appropriate assessments performed: PHQ 2 ?SDOH reviewed with support needs identified related to mental health counseling support ?Provided basic mental health support, education and interventions  ?Challenges with relationship with significant other discussed, current network of support explored ?Encouraged patient to verbalize his feelings regarding relationship conflicts ?Patient provided with validation and encouragement-positive coping strategies explored ?Collaborated with appropriate clinical care team members regarding patient needs ?Discussed several options for long term counseling based on need and insurance. Will plan to assist patient with narrowing the options once received. ?Reviewed mental health medications with patient prescribed by PCP and discussed compliance  ?Other interventions include: Motivational Interviewing employed ?Active listening / Reflection utilized  ?Emotional Support Provided  ? ?Patient Self Care Activities & Deficits:  ?Patient is unable to independently navigate community resource options without care coordination support ?Patient is able to implement clinical interventions discussed today and is motivated for treatment  ?Patient will select one of the agencies from the list provided  and call to schedule an appointment  ?Performs ADL's independently ?Performs IADL's independently ? ?Initial goal documentation ? ?  ?  ? ?Follow Up Plan: SW will follow up with patient by phone over the next 14 business days ? ? ?Jalayah Gutridge, LCSW ?Clinical  Social Worker  ?[facility] ?[phone] ? ?

## 2021-08-02 DIAGNOSIS — F32A Depression, unspecified: Secondary | ICD-10-CM

## 2021-08-11 ENCOUNTER — Ambulatory Visit: Payer: Medicare Other | Admitting: *Deleted

## 2021-08-11 NOTE — Chronic Care Management (AMB) (Signed)
? ?  08/11/2021 ? ?[name] ?16-Dec-1970 ?[id number] ? ?Phone call to patient for scheduled call, however patient stated that he was on his way to an appointment and needed to re-schedule. ? ?Appointment re-scheduled for 08/13/21 at 2pm. ? ? ? ?[employer], LCSW ?Clinical Social Worker  ?[facility] ?[phone] ? ? ?

## 2021-08-13 ENCOUNTER — Telehealth: Payer: Self-pay | Admitting: *Deleted

## 2021-08-13 ENCOUNTER — Telehealth: Payer: Medicare Other

## 2021-08-13 NOTE — Telephone Encounter (Signed)
?  Care Management  ? ?Follow Up Note ? ? ?08/13/2021 ?Name: Scott [name] MRN: [id number] DOB: Feb 19, 1971 ? ? ?Referred by: Birdie Sons, MD ?Reason for referral : Care Coordination ? ? ?A second unsuccessful telephone outreach was attempted today. The patient was referred to the case management team for assistance with care management and care coordination.  ? ?Follow Up Plan: Telephone follow up appointment with care management team member scheduled for: 08/20/21 ? ? ?Alane Hanssen, LCSW ?Clinical Social Worker  ?[facility] ?[phone] ? ? ?

## 2021-08-18 ENCOUNTER — Ambulatory Visit
Admission: RE | Admit: 2021-08-18 | Discharge: 2021-08-18 | Disposition: A | Discharge status: Home / Self Care | Payer: Medicare Other | Source: Ambulatory Visit | Attending: Family Medicine | Admitting: Family Medicine

## 2021-08-18 ENCOUNTER — Ambulatory Visit (INDEPENDENT_AMBULATORY_CARE_PROVIDER_SITE_OTHER): Payer: Medicare Other | Admitting: Family Medicine

## 2021-08-18 ENCOUNTER — Encounter: Payer: Self-pay | Admitting: Family Medicine

## 2021-08-18 ENCOUNTER — Ambulatory Visit: Payer: Self-pay

## 2021-08-18 ENCOUNTER — Ambulatory Visit
Admission: RE | Admit: 2021-08-18 | Discharge: 2021-08-18 | Disposition: A | Discharge status: Home / Self Care | Payer: Medicare Other | Attending: Family Medicine | Admitting: Family Medicine

## 2021-08-18 VITALS — BP 110/75 | HR 81 | Resp 16 | Wt 193.0 lb

## 2021-08-18 DIAGNOSIS — M25532 Pain in left wrist: Secondary | ICD-10-CM | POA: Diagnosis present

## 2021-08-18 DIAGNOSIS — S80211A Abrasion, right knee, initial encounter: Secondary | ICD-10-CM

## 2021-08-18 MED ORDER — TRAMADOL HCL 50 MG PO TABS: 50.0000 mg | ORAL_TABLET | Freq: Three times a day (TID) | ORAL | 0 refills | Status: AC | PRN

## 2021-08-18 NOTE — Telephone Encounter (Signed)
? ? ?  Chief Complaint: Left wrist pain, saw Dr. Caryn Section today.Asking for pain medication. ?Symptoms: Pain, swelling ?Frequency: Last night ?Pertinent Negatives: Patient denies  ?Disposition: '[]'$ ED /'[]'$ Urgent Care (no appt availability in office) / '[]'$ Appointment(In office/virtual)/ '[]'$  [facility]/ '[]'$ Home Care/ '[]'$ Refused Recommended Disposition /'[]'$ [facility]/ '[x]'$  Follow-up with PCP ?Additional Notes: Please advise pt.   ?Answer Assessment - Initial Assessment Questions ?1. MECHANISM: "How did the injury happen?"  ?    Fell last night ?2. WHEN: "When did the injury happen?" (Minutes or hours ago)  ?    Last night ?3. LOCATION: "Which wrist or hand is injured?" ?    Left wrist ?4. APPEARANCE of INJURY: "What does the injury look like?"  ?    Swelling ?5. SEVERITY: "Can your child move the wrist or hand normally?" For wrist, can rotate palm up and down and move the hand up and down (wrist flexion/extension). For hand, can make a fist and open it straight. ?    Painful ?6. SIZE: For bruises or swelling, ask: "How large is it?" (Inches or centimeters)  ?    N/a ?7. PAIN: "Is there pain?" If so, ask: "How bad is the pain?"  ?    yES - 10 ?8. TETANUS: For any breaks in the skin, ask: "When was the last tetanus booster?" ?    N/A ? ?Protocols used: Wrist or Hand Injury-P-AH ? ?

## 2021-08-18 NOTE — Progress Notes (Signed)
Established patient visit   Patient: Scott [name]   DOB: Nov 08, 1970   51 y.o. Male  MRN: [id number] Visit Date: 08/18/2021  Today's healthcare provider: Lelon Huh, MD   Clint Bolder as a scribe for Lelon Huh, MD.,have documented all relevant documentation on the behalf of Lelon Huh, MD,as directed by  Lelon Huh, MD while in the presence of Lelon Huh, MD.  Chief Complaint  Patient presents with   Fall   Subjective    Fall The accident occurred 12 to 24 hours ago. The fall occurred while walking (patient was coming out of his car in parking lot and throught he had walked over medium/bumper in between cars parked). He fell from a height of 1 to 2 ft. He landed on [place]. There was no blood loss. The point of impact was the left wrist, right knee and face. The pain is present in the left hand and left wrist. The pain is at a severity of 10/10. The pain is severe. The symptoms are aggravated by flexion, rotation and movement. Associated symptoms include numbness. Pertinent negatives include no abdominal pain, bowel incontinence, fever, headaches, hearing loss, hematuria, loss of consciousness, nausea, tingling, visual change or vomiting. He has tried NSAID for the symptoms.    Medications: Outpatient Medications Prior to Visit  Medication Sig   albuterol (VENTOLIN HFA) 108 (90 Base) MCG/ACT inhaler Inhale 2 puffs into the lungs every 4 (four) hours as needed for wheezing or shortness of breath.   ANDROGEL PUMP 20.25 MG/ACT (1.62%) GEL Apply 1 application topically daily.    bisoprolol (ZEBETA) 5 MG tablet Take 0.5 tablet (2.5 mg) by mouth once daily   blood glucose meter kit and supplies Use 1-2 times a day as needed.Dispense based on patient and insurance preference. (FOR ICD-10 E10.9, E11.9).   budesonide-formoterol (SYMBICORT) 80-4.5 MCG/ACT inhaler    cabergoline (DOSTINEX) 0.5 MG tablet Take by mouth.   clotrimazole-betamethasone (LOTRISONE)  cream Apply 1 application topically daily.   dapagliflozin propanediol (FARXIGA) 10 MG TABS tablet Take 10 mg by mouth daily.   glucose blood (ONETOUCH VERIO) test strip USE TO CHECK BLOOD SUGAR EVERY DAY   Lancets (ONETOUCH DELICA PLUS [id number]) MISC 1 each by Does not apply route daily.   Lancets (ONETOUCH ULTRASOFT) lancets Use with testing once daily and as neededUse as instructed   latanoprost (XALATAN) 0.005 % ophthalmic solution Place 1 drop into both eyes at bedtime.   nystatin ointment (MYCOSTATIN) Apply 1 application topically 2 (two) times daily.   OZEMPIC, 1 MG/DOSE, 4 MG/3ML SOPN Inject 1 mg into the skin once a week.   PARoxetine (PAXIL) 40 MG tablet Take 1 tablet (40 mg total) by mouth daily.   potassium chloride SA (KLOR-CON M) 20 MEQ tablet TAKE 2 TABLETS BY MOUTH TWICE DAILY   spironolactone (ALDACTONE) 50 MG tablet Take 1 tablet (50 mg total) by mouth daily.   torsemide (DEMADEX) 20 MG tablet TAKE 3 TABLETS(60 MG) BY MOUTH DAILY   [DISCONTINUED] Semaglutide (OZEMPIC, 0.25 OR 0.5 MG/DOSE, [place]) Inject 0.5 mg into the skin.   No facility-administered medications prior to visit.    Review of Systems  Constitutional:  Negative for fever.  Gastrointestinal:  Negative for abdominal pain, bowel incontinence, nausea and vomiting.  Genitourinary:  Negative for hematuria.  Neurological:  Positive for numbness. Negative for tingling, loss of consciousness and headaches.      Objective    BP 110/75   Pulse 81  Resp 16   Wt 193 lb (87.5 kg)   SpO2 98%   BMI 35.30 kg/m    Physical Exam  Both hands congenitally deformed. Left wrist is diffusely tender with pain on extension and flexion.   Clear 3x4cm abrasion left knee with no discharge or surrounding erythema.   Assessment & Plan     1. Abrasion, right knee, initial encounter Healing well, keep covered with bandage and daily application of neosporin ointment.   2. Left wrist pain  - DG Wrist Complete Left; Future -  DG Hand Complete Left; Future - traMADol (ULTRAM) 50 MG tablet; Take 1 tablet (50 mg total) by mouth every 8 (eight) hours as needed for up to 5 days.  Dispense: 15 tablet; Refill: 0      The entirety of the information documented in the History of Present Illness, Review of Systems and Physical Exam were personally obtained by me. Portions of this information were initially documented by the CMA and reviewed by me for thoroughness and accuracy.     Lelon Huh, MD  [facility] [phone] (phone) [phone] (fax)  [place]

## 2021-08-20 ENCOUNTER — Telehealth: Payer: Medicare Other | Admitting: *Deleted

## 2021-08-20 ENCOUNTER — Telehealth: Payer: Self-pay | Admitting: *Deleted

## 2021-08-20 NOTE — Telephone Encounter (Signed)
  Care Management   Follow Up Note   08/20/2021 Name: Scott [name] MRN: [id number] DOB: 19-Sep-1970   Referred by: Birdie Sons, MD Reason for referral : [place] unsuccessful telephone outreach was attempted today. The patient was referred to the case management team for assistance with care management and care coordination. The patient's primary care provider has been notified of our unsuccessful attempts to make or maintain contact with the patient. The care management team is pleased to engage with this patient at any time in the future should he/she be interested in assistance from the care management team.   Follow Up Plan: We have been unable to make contact with the patient for follow up. The care management team is available to follow up with the patient after provider conversation with the patient regarding recommendation for care management engagement and subsequent re-referral to the care management team.    [name], [place] Worker  [facility] [phone]

## 2021-10-03 ENCOUNTER — Other Ambulatory Visit: Payer: Self-pay | Admitting: Cardiovascular Disease

## 2021-10-20 NOTE — Progress Notes (Unsigned)
Cardiology Office Note  Date:  10/21/2021   ID:  Scott [name], DOB [birth year], MRN [id number]  PCP:  Scott Sons, MD   Chief Complaint  Patient presents with   6 month follow up    Patient c/o shortness of breath with over exertion. Medications reviewed by the patient verbally.     HPI:  Scott [name] is a 51 y.o. male with history of  congenital abnormality (Klippel-File syndrome) with restrictive lung disease and airway hyperactivity,  diastolic CHF,  Former smoker, quit 2008 hypertension,  hyperlipidemia,  asthma,  depression,  anxiety Who presents for routine follow-up of his chronic diastolic CHF, lung disease  LOV with myself March 2022 Presents today with caretaker In follow-up today he reports he has had slow improvement in his weight over the past several years On ozempic, Lost weight on 1 mg, dose increased up to 2 mg  Does not feel he is losing much weight on the higher dose of Ozempic  Does not eat out much, does not eat out very much One soda a day at most Lab work reviewed A1C >8, down to 7  Weight down 16 pounds over the past year  Back up 5 pounds  Reports some gait instability, several recent falls Works for [employer], reports having 2 falls  On 1 fall hit his head landed on the food  Still has some sensitivity on his forehead Did not seek medical attention at the time, happened over a month ago  No regular walking or exercise program No recent hospital admissions Reports compliance with torsemide 60 daily also on spironolactone, potassium Denies lower extremity edema, no PND orthopnea  EKG personally reviewed by myself on todays visit Normal sinus rhythm rate 89 bpm no significant ST-T wave changes  Other past medical history reviewed Admitted 12/19/2019-01/05/2020 for COPD exacerbation.   Bilateral ultrasounds were negative for DVT.   D-dimer was normal.    Echocardiogram 02/20/2020 with normal LVEF, normal wall motion, normal LV diastolic  dysfunction, RV normal size and pressure, no significant valvular normality.  Right atrial pressure mildly elevated at 8 mmHg.  Hospitalization November 2019, acute on chronic diastolic CHF with hypoxia requiring IV Lasix   Echo on 02/05/2018 showed an LV EF: 55% -   60%.  Stress test on 02/09/2018 showed no evidence of ischemia..  Sleep study on 03/10/2018, was negative for OSA. Pulmonary function testing showed severe restrictive lung disease.   PMH:   has a past medical history of Acid reflux, Anxiety, Arthritis, Asthma, CHF (congestive heart failure) ([place]), Depression, Enteritis presumed infectious (06/11/2008), Glaucoma, Hyperlipidemia, Hypertension, and Klippel-Feil syndrome.  PSH:    Past Surgical History:  Procedure Laterality Date   COLOSTOMY     [place] SHUNT PLACEMENT      Current Outpatient Medications  Medication Sig Dispense Refill   albuterol (VENTOLIN HFA) 108 (90 Base) MCG/ACT inhaler Inhale 2 puffs into the lungs every 4 (four) hours as needed for wheezing or shortness of breath. 1 each 1   ANDROGEL PUMP 20.25 MG/ACT (1.62%) GEL Apply 1 application topically daily.      bisoprolol (ZEBETA) 5 MG tablet Take 0.5 tablet (2.5 mg) by mouth once daily 90 tablet 3   cabergoline (DOSTINEX) 0.5 MG tablet Take by mouth.     dapagliflozin propanediol (FARXIGA) 10 MG TABS tablet Take 10 mg by mouth daily.     latanoprost (XALATAN) 0.005 % ophthalmic solution Place 1 drop into both eyes at bedtime.  OZEMPIC, 1 MG/DOSE, 4 MG/3ML SOPN Inject 2 mg into the skin once a week.     PARoxetine (PAXIL) 40 MG tablet Take 1 tablet (40 mg total) by mouth daily. 90 tablet 4   potassium chloride SA (KLOR-CON M) 20 MEQ tablet TAKE 2 TABLETS BY MOUTH TWICE DAILY 360 tablet 0   spironolactone (ALDACTONE) 50 MG tablet Take 1 tablet by mouth 2 (two) times daily.     torsemide (DEMADEX) 20 MG tablet TAKE 3 TABLETS(60 MG) BY MOUTH DAILY 270 tablet 2   blood glucose meter kit and supplies Use 1-2 times a  day as needed.Dispense based on patient and insurance preference. (FOR ICD-10 E10.9, E11.9). (Patient not taking: Reported on 10/21/2021) 1 each 0   budesonide-formoterol (SYMBICORT) 80-4.5 MCG/ACT inhaler  (Patient not taking: Reported on 10/21/2021)     clotrimazole-betamethasone (LOTRISONE) cream Apply 1 application topically daily. (Patient not taking: Reported on 10/21/2021) 30 g 0   glucose blood (ONETOUCH VERIO) test strip USE TO CHECK BLOOD SUGAR EVERY DAY (Patient not taking: Reported on 10/21/2021) 100 strip 0   Lancets (ONETOUCH DELICA PLUS [id number]) MISC 1 each by Does not apply route daily. (Patient not taking: Reported on 10/21/2021) 100 each 0   Lancets (ONETOUCH ULTRASOFT) lancets Use with testing once daily and as neededUse as instructed (Patient not taking: Reported on 10/21/2021) 100 each 0   nystatin ointment (MYCOSTATIN) Apply 1 application topically 2 (two) times daily. (Patient not taking: Reported on 10/21/2021) 30 g 0   No current facility-administered medications for this visit.     Allergies:   Codeine   Social History:  The patient  reports that he quit smoking about 15 years ago. His smoking use included cigarettes. He has a 10.00 pack-year smoking history. He has never used smokeless tobacco. He reports that he does not drink alcohol and does not use drugs.   Family History:   family history includes Cancer in his paternal grandfather; Colon cancer in his father; Diabetes in his sister; Heart disease in his father; Lung cancer in his maternal grandmother; Multiple sclerosis in his mother; Ovarian cancer in his paternal grandmother; Thyroid cancer in his maternal grandfather.    Review of Systems: Review of Systems  Constitutional: Negative.   HENT: Negative.    Respiratory: Negative.    Cardiovascular: Negative.   Gastrointestinal: Negative.   Musculoskeletal:  Positive for falls.  Neurological: Negative.   Psychiatric/Behavioral: Negative.    All other systems  reviewed and are negative.   PHYSICAL EXAM: VS:  BP 100/60 (BP Location: Left Arm, Patient Position: Sitting, Cuff Size: Normal)   Pulse 89   Ht '5\' 2"'  (1.575 m)   Wt 193 lb 4 oz (87.7 kg)   SpO2 97%   BMI 35.35 kg/m  , BMI Body mass index is 35.35 kg/m. Constitutional:  oriented to person, place, and time. No distress.  Obese Presenting in a wheelchair, obese, on oxygen HENT:  Head: Grossly normal Eyes:  no discharge. No scleral icterus.  Neck: No JVD, no carotid bruits  Cardiovascular: Regular rate and rhythm, no murmurs appreciated Pulmonary/Chest: Clear to auscultation bilaterally, no wheezes or rails Abdominal: Soft.  no distension.  no tenderness.  Musculoskeletal: Normal range of motion, disfigured hands/fingers Neurological:  normal muscle tone. Coordination normal. No atrophy Skin: Skin warm and dry Psychiatric: normal affect, pleasant  Recent Labs: No results found for requested labs within last 365 days.    Lipid Panel Lab Results  Component Value Date  CHOL 211 (H) 08/15/2020   HDL 75 08/15/2020   LDLCALC 116 (H) 08/15/2020   TRIG 115 08/15/2020      Wt Readings from Last 3 Encounters:  10/21/21 193 lb 4 oz (87.7 kg)  08/18/21 193 lb (87.5 kg)  04/24/21 197 lb (89.4 kg)     ASSESSMENT AND PLAN:  Problem List Items Addressed This Visit       Cardiology Problems   Chronic diastolic heart failure (HCC) - Primary (Chronic)   Relevant Medications   spironolactone (ALDACTONE) 50 MG tablet   Essential hypertension   Relevant Medications   spironolactone (ALDACTONE) 50 MG tablet   HLD (hyperlipidemia)   Relevant Medications   spironolactone (ALDACTONE) 50 MG tablet   Other Visit Diagnoses     Restrictive lung disease       Chronic obstructive pulmonary disease, unspecified COPD type ([place])       Tachycardia       Dyspnea on exertion         Acute on chronic respiratory distress Secondary to restrictive lung disease,  chronic diastolic  CHF Encouragement provided for slow weight loss over the past 2 years on Ozempic Appears euvolemic, will continue torsemide 60 daily with spironolactone and potassium  Chronic diastolic CHF Continue medications as above, appears euvolemic Low blood pressure likely in the setting of weight loss, asymptomatic, will monitor for now  Obesity Slow weight loss on Ozempic Improved A1c  Falls Strategies discussed to minimize falls Recommend regular walking program    Total encounter time more than 30 minutes  Greater than 50% was spent in counseling and coordination of care with the patient    Signed, Esmond Plants, M.D., Ph.D. [place], [place]

## 2021-10-21 ENCOUNTER — Encounter: Payer: Self-pay | Admitting: Cardiovascular Disease

## 2021-10-21 ENCOUNTER — Ambulatory Visit (INDEPENDENT_AMBULATORY_CARE_PROVIDER_SITE_OTHER): Payer: Medicare Other | Admitting: Cardiovascular Disease

## 2021-10-21 VITALS — BP 100/60 | HR 89 | Ht 62.0 in | Wt 193.2 lb

## 2021-10-21 DIAGNOSIS — I10 Essential (primary) hypertension: Secondary | ICD-10-CM

## 2021-10-21 DIAGNOSIS — R000 Tachycardia, unspecified: Secondary | ICD-10-CM | POA: Diagnosis not present

## 2021-10-21 DIAGNOSIS — E782 Mixed hyperlipidemia: Secondary | ICD-10-CM

## 2021-10-21 DIAGNOSIS — J984 Other disorders of lung: Secondary | ICD-10-CM | POA: Diagnosis not present

## 2021-10-21 DIAGNOSIS — I5032 Chronic diastolic (congestive) heart failure: Secondary | ICD-10-CM

## 2021-10-21 DIAGNOSIS — J449 Chronic obstructive pulmonary disease, unspecified: Secondary | ICD-10-CM

## 2021-10-21 DIAGNOSIS — R0609 Other forms of dyspnea: Secondary | ICD-10-CM

## 2021-10-21 NOTE — Patient Instructions (Signed)
Medication Instructions:  No changes  If you need a refill on your cardiac medications before your next appointment, please call your pharmacy.   Lab work: No new labs needed  Testing/Procedures: No new testing needed  Follow-Up: At [facility], you and your health needs are our priority.  As part of our continuing mission to provide you with exceptional heart care, we have created designated Provider Care Teams.  These Care Teams include your primary Cardiologist (physician) and Advanced Practice Providers (APPs -  Physician Assistants and Nurse Practitioners) who all work together to provide you with the care you need, when you need it.  You will need a follow up appointment in 12 months  Providers on your designated Care Team:   Christopher Berge, NP Ryan Dunn, PA-C Cadence Furth, PA-C  COVID-19 Vaccine Information can be found at: [email or web address] For questions related to vaccine distribution or appointments, please email [email or web address] or call [phone].   

## 2021-10-22 ENCOUNTER — Ambulatory Visit: Payer: Self-pay | Admitting: *Deleted

## 2021-10-22 ENCOUNTER — Ambulatory Visit: Payer: Medicare Other | Admitting: Physician Assistant

## 2021-10-22 NOTE — Telephone Encounter (Signed)
  Chief Complaint: vomiting Symptoms: abdominal pain, vomiting, constipation Frequency: started last night Pertinent Negatives: Patient denies fever, diarrhea Disposition: '[]'$ ED /'[x]'$ Urgent Care (no appt availability in office) / '[]'$ Appointment(In office/virtual)/ '[]'$  [place] [facility]/ '[]'$ Home Care/ '[]'$ Refused Recommended Disposition /'[]'$ [facility]/ '[]'$  Follow-up with PCP Additional Notes:

## 2021-10-22 NOTE — Progress Notes (Deleted)
   I,Sha'taria ,acting as a scribe for Lindsay Drubel, PA-C.,have documented all relevant documentation on the behalf of Lindsay Drubel, PA-C,as directed by  Lindsay Drubel, PA-C while in the presence of Lindsay Drubel, PA-C.   Established patient visit   Patient: Scott [name]   DOB: 07/10/1970   51 y.o. Male  MRN: [id number] Visit Date: 10/22/2021  Today's healthcare provider: Lindsay Drubel, PA-C   No chief complaint on file.  Subjective    HPI  Depression, Follow-up  He  was last seen for this 6 months ago. Changes made at last visit include continue current treatment.   He reports {excellent/good/fair/poor:19665} compliance with treatment. He {is/is not:21021397} having side effects. ***  He reports {DESC; GOOD/FAIR/POOR:18685} tolerance of treatment. Current symptoms include: {Symptoms; depression:1002} He feels he is {improved/worse/unchanged:3041574} since last visit.     08/18/2021    2:01 PM 08/18/2021    2:00 PM 07/28/2021   10:19 AM  Depression screen PHQ 2/9  Decreased Interest 2 2 0  Down, Depressed, Hopeless 3 3 1  PHQ - 2 Score 5 5 1  Altered sleeping 1    Tired, decreased energy 1 1   Change in appetite 0 0   Feeling bad or failure about yourself  0 0   Trouble concentrating 2 2   Moving slowly or fidgety/restless 1 1   Suicidal thoughts 0 0   PHQ-9 Score 10    Difficult doing work/chores Very difficult Very difficult     -----------------------------------------------------------------------------------------   Medications: Outpatient Medications Prior to Visit  Medication Sig   albuterol (VENTOLIN HFA) 108 (90 Base) MCG/ACT inhaler Inhale 2 puffs into the lungs every 4 (four) hours as needed for wheezing or shortness of breath.   ANDROGEL PUMP 20.25 MG/ACT (1.62%) GEL Apply 1 application topically daily.    bisoprolol (ZEBETA) 5 MG tablet Take 0.5 tablet (2.5 mg) by mouth once daily   blood glucose meter kit and supplies Use 1-2 times a day  as needed.Dispense based on patient and insurance preference. (FOR ICD-10 E10.9, E11.9). (Patient not taking: Reported on 10/21/2021)   budesonide-formoterol (SYMBICORT) 80-4.5 MCG/ACT inhaler  (Patient not taking: Reported on 10/21/2021)   cabergoline (DOSTINEX) 0.5 MG tablet Take by mouth.   clotrimazole-betamethasone (LOTRISONE) cream Apply 1 application topically daily. (Patient not taking: Reported on 10/21/2021)   dapagliflozin propanediol (FARXIGA) 10 MG TABS tablet Take 10 mg by mouth daily.   glucose blood (ONETOUCH VERIO) test strip USE TO CHECK BLOOD SUGAR EVERY DAY (Patient not taking: Reported on 10/21/2021)   Lancets (ONETOUCH DELICA PLUS [id number]) MISC 1 each by Does not apply route daily. (Patient not taking: Reported on 10/21/2021)   Lancets (ONETOUCH ULTRASOFT) lancets Use with testing once daily and as neededUse as instructed (Patient not taking: Reported on 10/21/2021)   latanoprost (XALATAN) 0.005 % ophthalmic solution Place 1 drop into both eyes at bedtime.   nystatin ointment (MYCOSTATIN) Apply 1 application topically 2 (two) times daily. (Patient not taking: Reported on 10/21/2021)   OZEMPIC, 1 MG/DOSE, 4 MG/3ML SOPN Inject 2 mg into the skin once a week.   PARoxetine (PAXIL) 40 MG tablet Take 1 tablet (40 mg total) by mouth daily.   potassium chloride SA (KLOR-CON M) 20 MEQ tablet TAKE 2 TABLETS BY MOUTH TWICE DAILY   spironolactone (ALDACTONE) 50 MG tablet Take 1 tablet by mouth 2 (two) times daily.   torsemide (DEMADEX) 20 MG tablet TAKE 3 TABLETS(60 MG) BY MOUTH DAILY   No facility-administered medications   prior to visit.    Review of Systems  {Labs  Heme  Chem  Endocrine  Serology  Results Review (optional):23779}   Objective    There were no vitals taken for this visit. {Show previous vital signs (optional):23777}  Physical Exam  ***  No results found for any visits on 10/22/21.  Assessment & Plan     ***  No follow-ups on file.      {provider  attestation***:1}   Mikey Kirschner, PA-C  [facility] [phone] (phone) [phone] (fax)  [place]

## 2021-10-22 NOTE — Telephone Encounter (Signed)
Summary: Diarrhea, nausea   Patient states after dinner last night 7-19 he developed diarrhea nausea and vomiting, patient is unable to keep food or liquids down   Patient states he forgot about his appt today due to being sick   Please fu w/ patient      Reason for Disposition  [1] Vomiting AND [2] abdomen looks much more swollen than usual  Answer Assessment - Initial Assessment Questions 1. VOMITING SEVERITY: "How many times have you vomited in the past 24 hours?"     - MILD:  1 - 2 times/day    - MODERATE: 3 - 5 times/day, decreased oral intake without significant weight loss or symptoms of dehydration    - SEVERE: 6 or more times/day, vomits everything or nearly everything, with significant weight loss, symptoms of dehydration      severe 2. ONSET: "When did the vomiting begin?"      Last night after dinner 3. FLUIDS: "What fluids or food have you vomited up today?" "Have you been able to keep any fluids down?"     Unable to keep anything down- tea, broth- smell made him sick 4. ABDOMEN PAIN: "Are your having any abdomen pain?" If Yes : "How bad is it and what does it feel like?" (e.g., crampy, dull, intermittent, constant)      Yes- gas pain type pain, distended stomach , constant 5. DIARRHEA: "Is there any diarrhea?" If Yes, ask: "How many times today?"      none 6. CONTACTS: "Is there anyone else in the family with the same symptoms?"      no 7. CAUSE: "What do you think is causing your vomiting?"     Not sure 8. HYDRATION STATUS: "Any signs of dehydration?" (e.g., dry mouth [not only dry lips], too weak to stand) "When did you last urinate?"     No- urinated 1/2 hour ago 9. OTHER SYMPTOMS: "Do you have any other symptoms?" (e.g., fever, headache, vertigo, vomiting blood or coffee grounds, recent head injury)     Indigestion, burping, dizziness 10. PREGNANCY: "Is there any chance you are pregnant?" "When was your last menstrual period?"  Protocols used: Vomiting-A-AH

## 2021-10-28 ENCOUNTER — Other Ambulatory Visit: Payer: Self-pay | Admitting: Cardiovascular Disease

## 2021-10-28 ENCOUNTER — Other Ambulatory Visit: Payer: Self-pay | Admitting: Cardiology

## 2021-11-06 ENCOUNTER — Ambulatory Visit: Payer: Self-pay

## 2021-11-06 NOTE — Telephone Encounter (Signed)
  Chief Complaint: Vomiting and diarrhea Symptoms: Vomiting and diarrhea Frequency:  a few weeks Pertinent Negatives: Patient denies Fever Disposition: '[]'$ ED /'[]'$ Urgent Care (no appt availability in office) / '[]'$ Appointment(In office/virtual)/ '[]'$  [facility]/ '[]'$ Home Care/ '[x]'$ Refused Recommended Disposition /'[]'$ [place] [facility]/ '[]'$  Follow-up with PCP Additional Notes: PT has had vomiting stomach cramps and diarrhea for a few weeks. Pt thinks it may be either a stomach infection or side effects from [place]. Pt refuses disposition of see provider within 24 hours.  PT will go to UC if needed before appt .  Reason for Disposition  [1] MILD or MODERATE vomiting AND [2] present > 48 hours (2 days) (Exception: Mild vomiting with associated diarrhea.)  Answer Assessment - Initial Assessment Questions 1. VOMITING SEVERITY: "How many times have you vomited in the past 24 hours?"     - MILD:  1 - 2 times/day    - MODERATE: 3 - 5 times/day, decreased oral intake without significant weight loss or symptoms of dehydration    - SEVERE: 6 or more times/day, vomits everything or nearly everything, with significant weight loss, symptoms of dehydration      mild 2. ONSET: "When did the vomiting begin?"      Weeks ago 3. FLUIDS: "What fluids or food have you vomited up today?" "Have you been able to keep any fluids down?"     Yes, in the middle of the night. Ate a pop tart and was able to keep it down 4. ABDOMEN PAIN: "Are your having any abdomen pain?" If Yes : "How bad is it and what does it feel like?" (e.g., crampy, dull, intermittent, constant)      Comes and goes, last week it was constant 5. DIARRHEA: "Is there any diarrhea?" If Yes, ask: "How many times today?"      None today. Had yesterday a couple of times. Got better through the day.  6. CONTACTS: "Is there anyone else in the family with the same symptoms?"      no 7. CAUSE: "What do you think is causing your vomiting?"      Infection ozempic 8. HYDRATION STATUS: "Any signs of dehydration?" (e.g., dry mouth [not only dry lips], too weak to stand) "When did you last urinate?"     Keeping hydrated, keeping water 9. OTHER SYMPTOMS: "Do you have any other symptoms?" (e.g., fever, headache, vertigo, vomiting blood or coffee grounds, recent head injury)     no 10. PREGNANCY: "Is there any chance you are pregnant?" "When was your last menstrual period?"       na  Protocols used: Vomiting-A-AH

## 2021-11-06 NOTE — Telephone Encounter (Signed)
Need to put Ozempic on hold until nausea and vomiting resolve

## 2021-11-06 NOTE — Telephone Encounter (Signed)
Patient was advised. Patient stated he has been off Sun[place] for about one week. Patient wants to know how long medication stays in your system. He is still having nausea and vomiting. Please advise?

## 2021-11-10 NOTE — Progress Notes (Signed)
I,Sulibeya S Dimas,acting as a scribe for Lelon Huh, MD.,have documented all relevant documentation on the behalf of Lelon Huh, MD,as directed by  Lelon Huh, MD while in the presence of Lelon Huh, MD.   Established patient visit   Patient: Scott [name]   DOB: 18-Nov-1970   51 y.o. Male  MRN: [id number] Visit Date: 11/11/2021  Today's healthcare provider: Lelon Huh, MD   Chief Complaint  Patient presents with   Diarrhea   Subjective    Patient presents for vomiting, stomach cramps and diarrhea x 3 weeks.. Discontinued Ozempic a couple of weeks ago.  Patient reports medication was increased to 76m weekly a few weeks by Dr. MCarmela Rima  Patient reports symptoms have improved. Patient denies diarrhea, or vomiting. He reports some nausea and abdominal pain. Has been constipated the last several days.   Medications: Outpatient Medications Prior to Visit  Medication Sig   albuterol (VENTOLIN HFA) 108 (90 Base) MCG/ACT inhaler Inhale 2 puffs into the lungs every 4 (four) hours as needed for wheezing or shortness of breath.   ANDROGEL PUMP 20.25 MG/ACT (1.62%) GEL Apply 1 application topically daily.    bisoprolol (ZEBETA) 5 MG tablet TAKE 1/2 TABLET BY MOUTH EVERY OTHER DAY   blood glucose meter kit and supplies Use 1-2 times a day as needed.Dispense based on patient and insurance preference. (FOR ICD-10 E10.9, E11.9).   cabergoline (DOSTINEX) 0.5 MG tablet Take by mouth.   dapagliflozin propanediol (FARXIGA) 10 MG TABS tablet Take 10 mg by mouth daily.   glucose blood (ONETOUCH VERIO) test strip USE TO CHECK BLOOD SUGAR EVERY DAY   Lancets (ONETOUCH DELICA PLUS [id number] MISC 1 each by Does not apply route daily.   latanoprost (XALATAN) 0.005 % ophthalmic solution Place 1 drop into both eyes at bedtime.   PARoxetine (PAXIL) 40 MG tablet Take 1 tablet (40 mg total) by mouth daily.   potassium chloride SA (KLOR-CON M) 20 MEQ tablet TAKE 2 TABLETS BY MOUTH TWICE DAILY    spironolactone (ALDACTONE) 50 MG tablet Take 1 tablet by mouth 2 (two) times daily.   torsemide (DEMADEX) 20 MG tablet TAKE 3 TABLETS(60 MG) BY MOUTH DAILY   budesonide-formoterol (SYMBICORT) 80-4.5 MCG/ACT inhaler  (Patient not taking: Reported on 10/21/2021)   Lancets (ONETOUCH ULTRASOFT) lancets Use with testing once daily and as neededUse as instructed (Patient not taking: Reported on 10/21/2021)   OZEMPIC, 1 MG/DOSE, 4 MG/3ML SOPN Inject 2 mg into the skin once a week. (Patient not taking: Reported on 11/11/2021)   [DISCONTINUED] clotrimazole-betamethasone (LOTRISONE) cream Apply 1 application topically daily. (Patient not taking: Reported on 10/21/2021)   [DISCONTINUED] nystatin ointment (MYCOSTATIN) Apply 1 application topically 2 (two) times daily. (Patient not taking: Reported on 10/21/2021)   No facility-administered medications prior to visit.    Review of Systems  Constitutional:  Negative for appetite change, chills and fever.  Respiratory:  Negative for chest tightness, shortness of breath and wheezing.   Cardiovascular:  Negative for chest pain and palpitations.  Gastrointestinal:  Positive for abdominal pain and nausea. Negative for diarrhea and vomiting.       Objective    BP (!) 97/58 (BP Location: Left Arm, Patient Position: Sitting, Cuff Size: Large)   Pulse 78   Temp 98.2 F (36.8 C) (Oral)   Resp 16   Wt 188 lb 14.4 oz (85.7 kg)   BMI 34.55 kg/m    Physical Exam   General: Appearance:    Obese male in no  acute distress  Eyes:    PERRL, conjunctiva/corneas clear, EOM's intact       Lungs:     Clear to auscultation bilaterally, respirations unlabored  Heart:    Normal heart rate. Normal rhythm. No murmurs, rubs, or gallops.    MS:   All extremities are intact.    Neurologic:   Awake, alert, oriented x 3. No apparent focal neurological defect.         Assessment & Plan     1. Generalized abdominal pain   2. Constipation, unspecified constipation  type Symptoms started after Ozempic dose was increased, and improved since putting medication on hold.   Check following labs.  - CBC - Comprehensive metabolic panel - Lipase - Magnesium - TSH - DG Abd 2 Views; Future   Advised that if he does start back on Ozempic he should start back on a much lower dose and should discuss with Dr. Carmela Rima.      The entirety of the information documented in the History of Present Illness, Review of Systems and Physical Exam were personally obtained by me. Portions of this information were initially documented by the CMA and reviewed by me for thoroughness and accuracy.     Lelon Huh, MD  [facility] [phone] (phone) [phone] (fax)  [place]

## 2021-11-11 ENCOUNTER — Ambulatory Visit (INDEPENDENT_AMBULATORY_CARE_PROVIDER_SITE_OTHER): Payer: Medicare Other | Admitting: Family Medicine

## 2021-11-11 ENCOUNTER — Ambulatory Visit
Admission: RE | Admit: 2021-11-11 | Discharge: 2021-11-11 | Disposition: A | Discharge status: Home / Self Care | Payer: Medicare Other | Source: Ambulatory Visit | Attending: Family Medicine | Admitting: Family Medicine

## 2021-11-11 ENCOUNTER — Encounter: Payer: Self-pay | Admitting: Family Medicine

## 2021-11-11 ENCOUNTER — Ambulatory Visit
Admission: RE | Admit: 2021-11-11 | Discharge: 2021-11-11 | Disposition: A | Discharge status: Home / Self Care | Payer: Medicare Other | Attending: Family Medicine | Admitting: Family Medicine

## 2021-11-11 VITALS — BP 97/58 | HR 78 | Temp 98.2°F | Resp 16 | Wt 188.9 lb

## 2021-11-11 DIAGNOSIS — K5900 Constipation, unspecified: Secondary | ICD-10-CM

## 2021-11-11 DIAGNOSIS — R1084 Generalized abdominal pain: Secondary | ICD-10-CM

## 2021-11-11 NOTE — Patient Instructions (Signed)
Please review the attached list of medications and notify my office if there are any errors.   Please go to the lab draw station in [address] on the second floor of [facility] . Normal hours are 8:00am to 11:30am and 1:00pm to 4:00pm Monday through Friday   Go to the [facility] on [facility] for abdominal Xray

## 2021-11-12 ENCOUNTER — Ambulatory Visit: Payer: Self-pay

## 2021-11-12 LAB — COMPREHENSIVE METABOLIC PANEL
ALT: 21 IU/L (ref 0–44)
AST: 14 IU/L (ref 0–40)
Albumin/Globulin Ratio: 1.8 (ref 1.2–2.2)
Albumin: 4.4 g/dL (ref 3.8–4.9)
Alkaline Phosphatase: 135 IU/L — ABNORMAL HIGH (ref 44–121)
BUN/Creatinine Ratio: 12 (ref 9–20)
BUN: 15 mg/dL (ref 6–24)
Bilirubin Total: 0.3 mg/dL (ref 0.0–1.2)
CO2: 28 mmol/L (ref 20–29)
Calcium: 9.6 mg/dL (ref 8.7–10.2)
Chloride: 94 mmol/L — ABNORMAL LOW (ref 96–106)
Creatinine, Ser: 1.21 mg/dL (ref 0.76–1.27)
Globulin, Total: 2.5 g/dL (ref 1.5–4.5)
Glucose: 126 mg/dL — ABNORMAL HIGH (ref 70–99)
Potassium: 4.8 mmol/L (ref 3.5–5.2)
Sodium: 139 mmol/L (ref 134–144)
Total Protein: 6.9 g/dL (ref 6.0–8.5)
eGFR: 72 mL/min/{1.73_m2} (ref >59)

## 2021-11-12 LAB — CBC
Hematocrit: 48.2 % (ref 37.5–51.0)
Hemoglobin: 15.6 g/dL (ref 13.0–17.7)
MCH: 30.1 pg (ref 26.6–33.0)
MCHC: 32.4 g/dL (ref 31.5–35.7)
MCV: 93 fL (ref 79–97)
Platelets: 381 10*3/uL (ref 150–450)
RBC: 5.18 x10E6/uL (ref 4.14–5.80)
RDW: 13.2 % (ref 11.6–15.4)
WBC: 10.9 10*3/uL — ABNORMAL HIGH (ref 3.4–10.8)

## 2021-11-12 LAB — LIPASE: Lipase: 69 U/L (ref 13–78)

## 2021-11-12 LAB — MAGNESIUM: Magnesium: 2.4 mg/dL — ABNORMAL HIGH (ref 1.6–2.3)

## 2021-11-12 LAB — TSH: TSH: 1.38 u[IU]/mL (ref 0.450–4.500)

## 2021-11-12 NOTE — Chronic Care Management (AMB) (Signed)
   11/12/2021  Scott [name] 1970/07/02 [id number]   Documentation encounter created to complete case transition. The care management team will continue to follow Scott [name] for care coordination.   [facility] [phone]

## 2021-11-19 ENCOUNTER — Ambulatory Visit: Payer: Self-pay

## 2021-11-19 NOTE — Patient Outreach (Signed)
  Care Coordination   11/19/2021 Name: TIMMY BUBECK MRN: [id number] DOB: [birth year]   Care Coordination Outreach Attempts:  An unsuccessful telephone outreach was attempted today to offer the patient information about available care coordination services as a benefit of their health plan.   Follow Up Plan:  Additional outreach attempts will be made to offer the patient care coordination information and services.   Encounter Outcome:  No Answer  Care Coordination Interventions Activated:  No   Care Coordination Interventions:  No, not indicated     [place] [facility] [phone]

## 2021-12-18 ENCOUNTER — Telehealth: Payer: Self-pay | Admitting: Cardiovascular Disease

## 2021-12-18 MED ORDER — BISOPROLOL FUMARATE 5 MG PO TABS: 2.5000 mg | ORAL_TABLET | Freq: Every day | ORAL | 3 refills | Status: DC

## 2021-12-18 NOTE — Telephone Encounter (Signed)
Patient's medication was incorrectly refilled on 10/28/21 for him to take 1/2 tablet every other day rather than 1/2 tablet daily. The patient has been taking 1/2 tablet daily as originally prescribed. He states that he ran out early since the prescription was incorrect and insurance won't pay for a refill. I advised him that I would send in a new updated prescription that was correct.

## 2021-12-18 NOTE — Telephone Encounter (Signed)
Pt c/o medication issue:  1. Name of Medication: bisoprolol (ZEBETA) 5 MG tablet  2. How are you currently taking this medication (dosage and times per day)? Taking medication wrong  3. Are you having a reaction (difficulty breathing--STAT)? No   4. What is your medication issue? Patient ran out of medication due to misunderstanding of taking the medication. Please discuss with patient

## 2022-02-01 ENCOUNTER — Telehealth: Payer: Self-pay | Admitting: Cardiovascular Disease

## 2022-02-01 MED ORDER — POTASSIUM CHLORIDE CRYS ER 20 MEQ PO TBCR: 40.0000 meq | EXTENDED_RELEASE_TABLET | Freq: Two times a day (BID) | ORAL | 0 refills | Status: DC

## 2022-02-01 NOTE — Telephone Encounter (Signed)
*  STAT* If patient is at the pharmacy, call can be transferred to refill team.   1. Which medications need to be refilled? (please list name of each medication and dose if known) Potassium '20mg'$  4 times a day  2. Which pharmacy/location (including street and city if local pharmacy) is medication to be sent to? [facility], [address] [place]  3. Do they need a 30 day or 90 day supply? 90 day

## 2022-02-01 NOTE — Telephone Encounter (Signed)
Refill request states that the patient is taking '20mg'$  tablets four times a day. Called patient to clarify how he was taking his medication. The instructions that is on file is for the patient to take 2 tablets twice a day. Patient confirmed that he is taking 2 tablets twice a day. Medication sent to [facility] in [place], [place].

## 2022-03-26 LAB — BASIC METABOLIC PANEL
BUN: 11 (ref 4–21)
CO2: 31 — AB (ref 13–22)
Chloride: 95 — AB (ref 99–108)
Creatinine: 1 (ref 0.6–1.3)
Glucose: 151
Potassium: 4.6 mEq/L (ref 3.5–5.1)
Sodium: 142 (ref 137–147)

## 2022-03-26 LAB — COMPREHENSIVE METABOLIC PANEL
Albumin: 4.1 (ref 3.5–5.0)
Calcium: 9.9 (ref 8.7–10.7)
Globulin: 2.5
eGFR: 97

## 2022-03-26 LAB — HEMOGLOBIN A1C: Hemoglobin A1C: 8.4

## 2022-03-26 LAB — HEPATIC FUNCTION PANEL
ALT: 19 U/L (ref 10–40)
AST: 15 (ref 14–40)
Alkaline Phosphatase: 136 — AB (ref 25–125)
Bilirubin, Total: 0.3

## 2022-04-06 LAB — LAB REPORT - SCANNED
A1c: 8.4
EGFR: 97

## 2022-04-26 ENCOUNTER — Telehealth: Payer: Self-pay | Admitting: *Deleted

## 2022-04-26 NOTE — Telephone Encounter (Signed)
Patient is asking if it is ok to use otc gel pads from [place],  until upcoming appointment on [place] not been seen since 2022, wanting recommendations until seen.

## 2022-04-27 ENCOUNTER — Ambulatory Visit (INDEPENDENT_AMBULATORY_CARE_PROVIDER_SITE_OTHER): Payer: 59 | Admitting: Podiatry

## 2022-04-27 DIAGNOSIS — Z91199 Patient's noncompliance with other medical treatment and regimen due to unspecified reason: Secondary | ICD-10-CM

## 2022-04-27 NOTE — Telephone Encounter (Signed)
2nd attempt to reach patient, no answer, could not leave a voice message, mailbox full.

## 2022-04-27 NOTE — Telephone Encounter (Signed)
Returned the call back to patient to update that he can use the gel pads before his upcoming appointment, no answer, could not leave voice message.

## 2022-04-27 NOTE — Progress Notes (Signed)
No show

## 2022-04-28 ENCOUNTER — Ambulatory Visit (INDEPENDENT_AMBULATORY_CARE_PROVIDER_SITE_OTHER): Payer: 59 | Admitting: Podiatry

## 2022-04-28 ENCOUNTER — Encounter: Payer: Self-pay | Admitting: Podiatry

## 2022-04-28 ENCOUNTER — Ambulatory Visit: Payer: 59 | Admitting: Podiatry

## 2022-04-28 DIAGNOSIS — B351 Tinea unguium: Secondary | ICD-10-CM | POA: Diagnosis not present

## 2022-04-28 DIAGNOSIS — M79609 Pain in unspecified limb: Secondary | ICD-10-CM

## 2022-04-28 DIAGNOSIS — E1142 Type 2 diabetes mellitus with diabetic polyneuropathy: Secondary | ICD-10-CM | POA: Diagnosis not present

## 2022-04-28 MED ORDER — GABAPENTIN 300 MG PO CAPS: 300.0000 mg | ORAL_CAPSULE | Freq: Two times a day (BID) | ORAL | 3 refills | Status: AC

## 2022-04-28 NOTE — Progress Notes (Signed)
Subjective:  Patient ID: Scott [name], male    DOB: January 02, 1971,  MRN: [id number] HPI Chief Complaint  Patient presents with   Foot Pain    Diabetic - last A1c was 8.3, thinks having neuropathic pain, tried numbing gel, but not helping, coldness of weather is making worse, would like his toenails cut as well    52 y.o. male presents with the above complaint.   ROS: Denies fever chills nausea vomit muscle aches pains calf pain back pain chest pain shortness of breath.  Past Medical History:  Diagnosis Date   Acid reflux    Anxiety    Arthritis    Asthma    CHF (congestive heart failure) ([place])    Depression    Enteritis presumed infectious 06/11/2008   Glaucoma    Hyperlipidemia    Hypertension    Klippel-Feil syndrome    Past Surgical History:  Procedure Laterality Date   COLOSTOMY     DEN[place] SHUNT PLACEMENT      Current Outpatient Medications:    gabapentin (NEURONTIN) 300 MG capsule, Take 1 capsule (300 mg total) by mouth 2 (two) times daily., Disp: 60 capsule, Rfl: 3   hydrochlorothiazide (HYDRODIURIL) 12.5 MG tablet, Take 12.5 mg by mouth every morning., Disp: , Rfl:    TRULICITY 1.44 RX/5.4MG SOPN, Inject into the skin., Disp: , Rfl:    TRULICITY 1.5 QQ/7.6PP SOPN, Inject 1.5 mg into the skin once a week., Disp: , Rfl:    albuterol (VENTOLIN HFA) 108 (90 Base) MCG/ACT inhaler, Inhale 2 puffs into the lungs every 4 (four) hours as needed for wheezing or shortness of breath., Disp: 1 each, Rfl: 1   ANDROGEL PUMP 20.25 MG/ACT (1.62%) GEL, Apply 1 application topically daily. , Disp: , Rfl:    bisoprolol (ZEBETA) 5 MG tablet, Take 0.5 tablets (2.5 mg total) by mouth daily., Disp: 45 tablet, Rfl: 3   blood glucose meter kit and supplies, Use 1-2 times a day as needed.Dispense based on patient and insurance preference. (FOR ICD-10 E10.9, E11.9)., Disp: 1 each, Rfl: 0   budesonide-formoterol (SYMBICORT) 80-4.5 MCG/ACT inhaler, , Disp: , Rfl:    cabergoline (DOSTINEX) 0.5 MG  tablet, Take by mouth., Disp: , Rfl:    dapagliflozin propanediol (FARXIGA) 10 MG TABS tablet, Take 10 mg by mouth daily., Disp: , Rfl:    glucose blood (ONETOUCH VERIO) test strip, USE TO CHECK BLOOD SUGAR EVERY DAY, Disp: 100 strip, Rfl: 0   Lancets (ONETOUCH DELICA PLUS [id number]) MISC, 1 each by Does not apply route daily., Disp: 100 each, Rfl: 0   Lancets (ONETOUCH ULTRASOFT) lancets, Use with testing once daily and as neededUse as instructed (Patient not taking: Reported on 10/21/2021), Disp: 100 each, Rfl: 0   latanoprost (XALATAN) 0.005 % ophthalmic solution, Place 1 drop into both eyes at bedtime., Disp: , Rfl:    OZEMPIC, 1 MG/DOSE, 4 MG/3ML SOPN, Inject 2 mg into the skin once a week. (Patient not taking: Reported on 11/11/2021), Disp: , Rfl:    PARoxetine (PAXIL) 40 MG tablet, Take 1 tablet (40 mg total) by mouth daily., Disp: 90 tablet, Rfl: 4   potassium chloride SA (KLOR-CON M) 20 MEQ tablet, Take 2 tablets (40 mEq total) by mouth 2 (two) times daily., Disp: 360 tablet, Rfl: 0   spironolactone (ALDACTONE) 50 MG tablet, Take 1 tablet by mouth 2 (two) times daily., Disp: , Rfl:    torsemide (DEMADEX) 20 MG tablet, TAKE 3 TABLETS(60 MG) BY MOUTH DAILY, Disp: 270 tablet,  Rfl: 2  Allergies  Allergen Reactions   Codeine Shortness Of Breath   Review of Systems Objective:  There were no vitals filed for this visit.  General: Well developed, nourished, in no acute distress, alert and oriented x3   Dermatological: Skin is warm, dry and supple bilateral. Nails x 10 are well maintained; remaining integument appears unremarkable at this time. There are no open sores, no preulcerative lesions, no rash or signs of infection present.  Vascular: Dorsalis Pedis artery and Posterior Tibial artery pedal pulses are 2/4 bilateral with immedate capillary fill time. Pedal hair growth present. No varicosities and no lower extremity edema present bilateral.   Neruologic: Grossly intact via light touch  bilateral. Vibratory intact via tuning fork bilateral. Protective threshold with Semmes Wienstein monofilament diminished to all pedal sites bilateral. Patellar and Achilles deep tendon reflexes 2+ bilateral. No Babinski or clonus noted bilateral.   Musculoskeletal: No gross boney pedal deformities bilateral. No pain, crepitus, or limitation noted with foot and ankle range of motion bilateral. Muscular strength 5/5 in all groups tested bilateral.  Gait: Unassisted, Nonantalgic.    Radiographs:  None taken  Assessment & Plan:   Assessment: Diabetic peripheral neuropathy  Plan: Started him on gabapentin 300 mg 1 p.o. twice daily I also trimmed his nails for him today.     [facility] Discher T. [place], [place]

## 2022-05-09 ENCOUNTER — Other Ambulatory Visit: Payer: Self-pay | Admitting: Cardiovascular Disease

## 2022-05-19 ENCOUNTER — Ambulatory Visit: Payer: 59

## 2022-05-24 ENCOUNTER — Ambulatory Visit: Payer: 59

## 2022-06-02 ENCOUNTER — Encounter: Payer: Self-pay | Admitting: Podiatry

## 2022-06-02 ENCOUNTER — Ambulatory Visit (INDEPENDENT_AMBULATORY_CARE_PROVIDER_SITE_OTHER): Payer: 59 | Admitting: Podiatry

## 2022-06-02 DIAGNOSIS — E1142 Type 2 diabetes mellitus with diabetic polyneuropathy: Secondary | ICD-10-CM

## 2022-06-02 NOTE — Progress Notes (Signed)
He presents today for follow-up of his neuropathy and his pre-[place].  He states that he did not start the pregabalin he is worried about side effects and also states that is starting to get warmer so his toes are feeling better.  Objective: Vital signs are stable he is alert and oriented x 3.  Pulses are palpable.  No change in physical exam.  Assessment: Diabetic peripheral neuropathy.  Plan: Follow-up with me on an as-needed basis.

## 2022-06-09 ENCOUNTER — Other Ambulatory Visit: Payer: 59

## 2022-06-21 ENCOUNTER — Other Ambulatory Visit: Payer: 59

## 2022-06-23 ENCOUNTER — Telehealth: Payer: Self-pay | Admitting: Cardiovascular Disease

## 2022-06-23 MED ORDER — SPIRONOLACTONE 50 MG PO TABS: 50.0000 mg | ORAL_TABLET | Freq: Two times a day (BID) | ORAL | 1 refills | Status: DC

## 2022-06-23 NOTE — Telephone Encounter (Signed)
*  STAT* If patient is at the pharmacy, call can be transferred to refill team.   1. Which medications need to be refilled? (please list name of each medication and dose if known)   spironolactone (ALDACTONE) 50 MG tablet    2. Which pharmacy/location (including street and city if local pharmacy) is medication to be sent to?  [facility] [place], [address] AT [facility] [address]    3. Do they need a 30 day or 90 day supply? 90  Only two days left of medication.

## 2022-06-24 ENCOUNTER — Telehealth: Payer: Self-pay | Admitting: Family Medicine

## 2022-06-24 NOTE — Telephone Encounter (Signed)
Contacted [name] to schedule their annual wellness visit. Appointment made for 06/28/2022.  [place] Direct Dial: [phone]

## 2022-06-28 ENCOUNTER — Ambulatory Visit (INDEPENDENT_AMBULATORY_CARE_PROVIDER_SITE_OTHER): Payer: 59

## 2022-06-28 VITALS — Ht 62.0 in | Wt 188.0 lb

## 2022-06-28 DIAGNOSIS — Z0000 Encounter for general adult medical examination without abnormal findings: Secondary | ICD-10-CM

## 2022-06-28 NOTE — Patient Instructions (Signed)
[name] , Thank you for taking time to come for your Medicare Wellness Visit. I appreciate your ongoing commitment to your health goals. Please review the following plan we discussed and let me know if I can assist you in the future.   These are the goals we discussed:  Goals       "I have a huge roach problem" (pt-stated)      Current Barriers:  Patient has a roach infestation in his home   Clinical Social Work Clinical Goal(s):  Over the next 90 days, patient will work with the landlord  to address needs related to treating the roach infestation in his home.  Interventions: Patient interviewed and appropriate assessments performed Confirmed that he is feeling much better due to conversation and plan made with landlord to address the roach problem in his home Confirmed that his best friend came down from [place] and helped get his home cleaned up, clutter removed and re-arranged to prepare for the extermination Confirmed that patient is feeling like his home is "livable" again Positive reinforcement provided for progress made to prepare for extermination Discussed need to follow with a mental health therapist for ongoing mental health support Discussed follow up with [place], [employer] or the [employer]  Discussed plans with patient to follow up with one of the agencies listed above for ongoing mental health follow up. This Education officer, museum provided patient with direct contact information for care management team to contact if needed in the future   Patient Self Care Activities:  Patient verbalizes understanding of plan to work with the landlord regarding roach problem, however is reluctant to contact the lanldord on his own Calls provider office for new concerns or questions Reluctance to contact lanldord on his own regarding roach infestation  Please see past updates related to this goal by clicking on the "Past Updates" button in the selected goal        Chronic  Disease Management      CARE PLAN ENTRY (see longitudinal plan of care for additional care plan information)  Current Barriers:  Chronic Disease Management support and education needs related to HTN, COPD, CHF, DM and HLD.  Case Manager Clinical Goal(s): Over the next 120 days, patient will: Not require hospitalization or emergent care d/t complications r/t chronic illnesses. Attend all scheduled medical appointments. Take all medications as prescribed. Monitor BP and record readings. Maintain BP at goal of < 140/90. Weigh daily and maintain a log. Monitor fasting blood glucose readings and maintain a log. Follow recommended treatment plan to prevent COPD exacerbation. Follow recommended safety measures to prevent falls and injuries. Over the next 30 days, patient will: Follow up with PCP regarding request for a new Pulmonology referral. Follow up with the care management team regarding possible need for Care Guide referrals.    Interventions:  Inter-disciplinary care team collaboration (see longitudinal plan of care) Reviewed medications. Advised to take all medications as prescribed. Advised to avoid abruptly discontinuing medications and notify provider if unable to tolerate prescribed regimen. Encouraged to notify care management team with concerns regarding medication management or prescription costs.  Reviewed FBS readings. Reports fasting readings have ranged from 100 to the low 120's. Denies s/sx r/t hypoglycemia or hyperglycemia.  Reviewed weight parameters and s/sx of complications r/t CHF. Reports weight has remained stable. Reports symptoms have been well controlled. Denies increased abdominal or lower extremity edema. Denies changes or decline in activity tolerance.    Reviewed plan r/t COPD self-management. Reports  improvements since the last outreach and states COPD has been well controlled over the past few weeks and using his rescue inhalers less frequently. He  continues to use home O2 at 2 L/min. Reports now using it mostly at night. He is currently establishing care with a Pulmonologist and will complete the initial evaluation next month.   Discussed plan for ongoing care management and follow-up. Reports doing well today. During our previous outreach he expressed concerns regarding possible need for additional assistance. Reports concerns regarding his home inspection have been resolved but agreed to update our team if this changes. Reports his condition has significantly improved since the initial outreach. Declines current need for in-home assistance. Reports his fiance is available to assist as needed. Denies urgent concerns or changes in care management needs. Agreeable to follow-up outreach next month.   Patient Self Care Activities:  Self administers medications  Attends scheduled provider appointments Calls pharmacy for medication refills Performs ADL's independently   Please see past updates related to this goal by clicking on the "Past Updates" button in the selected goal        DIET - EAT [place] to continue current diet plan of cutting out salt and fatty meats and focus on eating all lean meats.       LIFESTYLE - DECREASE FALLS RISK      Recommend to remove any items from the home that may cause slips or trips.      Manage My Emotions      Timeframe:  Long-Range Goal Priority:  Medium Start Date:   07/28/21                          Expected End Date:   01/27/22                    Follow Up Date 08/11/21    - begin personal counseling - talk about feelings with a friend, family or spiritual advisor - practice positive thinking and self-talk    Why is this important?   When you are stressed, down or upset, your body reacts too.  For example, your blood pressure may get higher; you may have a headache or stomachache.  When your emotions get the best of you,  your body's ability to fight off cold and flu gets weak.  These steps will help you manage your emotions.     Notes:         This is a list of the screening recommended for you and due dates:  Health Maintenance  Topic Date Due   Eye exam for diabetics  Never done   Yearly kidney health urinalysis for diabetes  Never done   Zoster (Shingles) Vaccine (1 of 2) Never done   Colon Cancer Screening  Never done   Hemoglobin A1C  02/15/2021   Complete foot exam   03/07/2021   Flu Shot  Never done   COVID-19 Vaccine (4 - 2023-24 season) 12/04/2021   Yearly kidney function blood test for diabetes  11/12/2022   Medicare Annual Wellness Visit  06/28/2023   DTaP/Tdap/Td vaccine (2 - Td or Tdap) 02/21/2028   HIV Screening  Completed   HPV Vaccine  Aged Out    Advanced directives: no  Conditions/risks identified: falls risk  Next appointment: Follow up in one year for your annual wellness  visit 06/29/2023@ 9:15am telephone  Preventive Care 40-64 Years, Male Preventive care refers to lifestyle choices and visits with your health care provider that can promote health and wellness. What does preventive care include? A yearly physical exam. This is also called an annual well check. Dental exams once or twice a year. Routine eye exams. Ask your health care provider how often you should have your eyes checked. Personal lifestyle choices, including: Daily care of your teeth and gums. Regular physical activity. Eating a healthy diet. Avoiding tobacco and drug use. Limiting alcohol use. Practicing safe sex. Taking low-dose aspirin every day starting at age 35. What happens during an annual well check? The services and screenings done by your health care provider during your annual well check will depend on your age, overall health, lifestyle risk factors, and family history of disease. Counseling  Your health care provider may ask you questions about your: Alcohol use. Tobacco use. Drug  use. Emotional well-being. Home and relationship well-being. Sexual activity. Eating habits. Work and work Statistician. Screening  You may have the following tests or measurements: Height, weight, and BMI. Blood pressure. Lipid and cholesterol levels. These may be checked every 5 years, or more frequently if you are over 38 years old. Skin check. Lung cancer screening. You may have this screening every year starting at age 4 if you have a 30-pack-year history of smoking and currently smoke or have quit within the past 15 years. Fecal occult blood test (FOBT) of the stool. You may have this test every year starting at age 30. Flexible sigmoidoscopy or colonoscopy. You may have a sigmoidoscopy every 5 years or a colonoscopy every 10 years starting at age 31. Prostate cancer screening. Recommendations will vary depending on your family history and other risks. Hepatitis C blood test. Hepatitis B blood test. Sexually transmitted disease (STD) testing. Diabetes screening. This is done by checking your blood sugar (glucose) after you have not eaten for a while (fasting). You may have this done every 1-3 years. Discuss your test results, treatment options, and if necessary, the need for more tests with your health care provider. Vaccines  Your health care provider may recommend certain vaccines, such as: Influenza vaccine. This is recommended every year. Tetanus, diphtheria, and acellular pertussis (Tdap, Td) vaccine. You may need a Td booster every 10 years. Zoster vaccine. You may need this after age 38. Pneumococcal 13-valent conjugate (PCV13) vaccine. You may need this if you have certain conditions and have not been vaccinated. Pneumococcal polysaccharide (PPSV23) vaccine. You may need one or two doses if you smoke cigarettes or if you have certain conditions. Talk to your health care provider about which screenings and vaccines you need and how often you need them. This information is  not intended to replace advice given to you by your health care provider. Make sure you discuss any questions you have with your health care provider. Document Released: 04/18/2015 Document Revised: 12/10/2015 Document Reviewed: 01/21/2015 Elsevier Interactive Patient Education  2017 [place] Prevention in the Home Falls can cause injuries. They can happen to people of all ages. There are many things you can do to make your home safe and to help prevent falls. What can I do on the outside of my home? Regularly fix the edges of walkways and driveways and fix any cracks. Remove anything that might make you trip as you walk through a door, such as a raised step or threshold. Trim any bushes or trees on the path  to your home. Use bright outdoor lighting. Clear any walking paths of anything that might make someone trip, such as rocks or tools. Regularly check to see if handrails are loose or broken. Make sure that both sides of any steps have handrails. Any raised decks and porches should have guardrails on the edges. Have any leaves, snow, or ice cleared regularly. Use sand or salt on walking paths during winter. Clean up any spills in your garage right away. This includes oil or grease spills. What can I do in the bathroom? Use night lights. Install grab bars by the toilet and in the tub and shower. Do not use towel bars as grab bars. Use non-skid mats or decals in the tub or shower. If you need to sit down in the shower, use a plastic, non-slip stool. Keep the floor dry. Clean up any water that spills on the floor as soon as it happens. Remove soap buildup in the tub or shower regularly. Attach bath mats securely with double-sided non-slip rug tape. Do not have throw rugs and other things on the floor that can make you trip. What can I do in the bedroom? Use night lights. Make sure that you have a light by your bed that is easy to reach. Do not use any sheets or blankets that  are too big for your bed. They should not hang down onto the floor. Have a firm chair that has side arms. You can use this for support while you get dressed. Do not have throw rugs and other things on the floor that can make you trip. What can I do in the kitchen? Clean up any spills right away. Avoid walking on wet floors. Keep items that you use a lot in easy-to-reach places. If you need to reach something above you, use a strong step stool that has a grab bar. Keep electrical cords out of the way. Do not use floor polish or wax that makes floors slippery. If you must use wax, use non-skid floor wax. Do not have throw rugs and other things on the floor that can make you trip. What can I do with my stairs? Do not leave any items on the stairs. Make sure that there are handrails on both sides of the stairs and use them. Fix handrails that are broken or loose. Make sure that handrails are as long as the stairways. Check any carpeting to make sure that it is firmly attached to the stairs. Fix any carpet that is loose or worn. Avoid having throw rugs at the top or bottom of the stairs. If you do have throw rugs, attach them to the floor with carpet tape. Make sure that you have a light switch at the top of the stairs and the bottom of the stairs. If you do not have them, ask someone to add them for you. What else can I do to help prevent falls? Wear shoes that: Do not have high heels. Have rubber bottoms. Are comfortable and fit you well. Are closed at the toe. Do not wear sandals. If you use a stepladder: Make sure that it is fully opened. Do not climb a closed stepladder. Make sure that both sides of the stepladder are locked into place. Ask someone to hold it for you, if possible. Clearly mark and make sure that you can see: Any grab bars or handrails. First and last steps. Where the edge of each step is. Use tools that help you move around (mobility aids) if  they are needed. These  include: Canes. Walkers. Scooters. Crutches. Turn on the lights when you go into a dark area. Replace any light bulbs as soon as they burn out. Set up your furniture so you have a clear path. Avoid moving your furniture around. If any of your floors are uneven, fix them. If there are any pets around you, be aware of where they are. Review your medicines with your doctor. Some medicines can make you feel dizzy. This can increase your chance of falling. Ask your doctor what other things that you can do to help prevent falls. This information is not intended to replace advice given to you by your health care provider. Make sure you discuss any questions you have with your health care provider. Document Released: 01/16/2009 Document Revised: 08/28/2015 Document Reviewed: 04/26/2014 Elsevier Interactive Patient Education  2017 [employer].

## 2022-06-28 NOTE — Progress Notes (Signed)
I connected with  [name] on 06/28/22 by a audio enabled telemedicine application and verified that I am speaking with the correct person using two identifiers.  Patient Location: Home  Provider Location: Office/Clinic  I discussed the limitations of evaluation and management by telemedicine. The patient expressed understanding and agreed to proceed.  Subjective:   Scott [name] is a 52 y.o. male who presents for Medicare Annual/Subsequent preventive examination.  Review of Systems    Cardiac Risk Factors include: dyslipidemia;diabetes mellitus;male gender;hypertension;sedentary lifestyle;obesity (BMI >30kg/m2)     Objective:    Today's Vitals   06/28/22 0941 06/28/22 0942  Weight: 188 lb (85.3 kg)   Height: 5\' 2"  (1.575 m)   PainSc:  5    Body mass index is 34.39 kg/m.     06/28/2022    9:55 AM 04/22/2021   10:36 AM 04/16/2020   11:26 AM 12/29/2019    8:46 PM 04/12/2019   11:14 AM 04/10/2018    1:46 PM 02/05/2018    2:24 AM  Advanced Directives  Does Patient Have a Medical Advance Directive? No No No No No No No  Would patient like information on creating a medical advance directive?  No - Patient declined No - Patient declined No - Patient declined No - Patient declined  No - Patient declined    Current Medications (verified) Outpatient Encounter Medications as of 06/28/2022  Medication Sig   albuterol (VENTOLIN HFA) 108 (90 Base) MCG/ACT inhaler Inhale 2 puffs into the lungs every 4 (four) hours as needed for wheezing or shortness of breath.   ANDROGEL PUMP 20.25 MG/ACT (1.62%) GEL Apply 1 application topically daily.    bisoprolol (ZEBETA) 5 MG tablet Take 0.5 tablets (2.5 mg total) by mouth daily.   blood glucose meter kit and supplies Use 1-2 times a day as needed.Dispense based on patient and insurance preference. (FOR ICD-10 E10.9, E11.9).   cabergoline (DOSTINEX) 0.5 MG tablet Take by mouth.   dapagliflozin propanediol (FARXIGA) 10 MG TABS tablet Take 10 mg by mouth  daily.   gabapentin (NEURONTIN) 300 MG capsule Take 1 capsule (300 mg total) by mouth 2 (two) times daily.   glucose blood (ONETOUCH VERIO) test strip USE TO CHECK BLOOD SUGAR EVERY DAY   hydrochlorothiazide (HYDRODIURIL) 12.5 MG tablet Take 12.5 mg by mouth every morning.   Lancets (ONETOUCH DELICA PLUS Q000111Q) MISC 1 each by Does not apply route daily.   latanoprost (XALATAN) 0.005 % ophthalmic solution Place 1 drop into both eyes at bedtime.   PARoxetine (PAXIL) 40 MG tablet Take 1 tablet (40 mg total) by mouth daily.   potassium chloride SA (KLOR-CON M) 20 MEQ tablet TAKE 2 TABLETS(40 MEQ) BY MOUTH TWICE DAILY   spironolactone (ALDACTONE) 50 MG tablet Take 1 tablet (50 mg total) by mouth 2 (two) times daily.   torsemide (DEMADEX) 20 MG tablet TAKE 3 TABLETS(60 MG) BY MOUTH DAILY   TRULICITY [id number] 0000000 SOPN Inject into the skin.   TRULICITY 1.5 0000000 SOPN Inject 1.5 mg into the skin once a week.   budesonide-formoterol (SYMBICORT) 80-4.5 MCG/ACT inhaler  (Patient not taking: Reported on 10/21/2021)   Lancets (ONETOUCH ULTRASOFT) lancets Use with testing once daily and as neededUse as instructed (Patient not taking: Reported on 10/21/2021)   OZEMPIC, 1 MG/DOSE, 4 MG/3ML SOPN Inject 2 mg into the skin once a week. (Patient not taking: Reported on 11/11/2021)   No facility-administered encounter medications on file as of 06/28/2022.    Allergies (verified) Codeine  History: Past Medical History:  Diagnosis Date   Acid reflux    Anxiety    Arthritis    Asthma    CHF (congestive heart failure) (HCC)    Depression    Enteritis presumed infectious 06/11/2008   Glaucoma    Hyperlipidemia    Hypertension    Klippel-Feil syndrome    Past Surgical History:  Procedure Laterality Date   COLOSTOMY     DEN[place] SHUNT PLACEMENT     Family History  Problem Relation Age of Onset   Multiple sclerosis Mother    Colon cancer Father    Heart disease Father    Diabetes Sister    Lung  cancer Maternal Grandmother    Thyroid cancer Maternal Grandfather    Ovarian cancer Paternal Grandmother    Cancer Paternal Grandfather    Social History   Socioeconomic History   Marital status: Significant Other    Spouse name: Not on file   Number of children: 0   Years of education: Not on file   Highest education level: Some college, no degree  Occupational History   Occupation: disability  Tobacco Use   Smoking status: Former    Packs/day: 1.00    Years: 10.00    Additional pack years: 0.00    Total pack years: 10.00    Types: Cigarettes    Quit date: 05/2006    Years since quitting: 16.1   Smokeless tobacco: Never   Tobacco comments:    quit 05/2006  Vaping Use   Vaping Use: Former   Devices: tried once  Substance and Sexual Activity   Alcohol use: No    Alcohol/week: 0.0 standard drinks of alcohol   Drug use: No   Sexual activity: Not on file  Other Topics Concern   Not on file  Social History Narrative   Not on file   Social Determinants of Health   Financial Resource Strain: High Risk (06/28/2022)   Overall Financial Resource Strain (CARDIA)    Difficulty of Paying Living Expenses: Hard  Food Insecurity: No Food Insecurity (06/28/2022)   Hunger Vital Sign    Worried About Running Out of Food in the Last Year: Never true    Ran Out of Food in the Last Year: Never true  Transportation Needs: No Transportation Needs (06/28/2022)   PRAPARE - Hydrologist (Medical): No    Lack of Transportation (Non-Medical): No  Physical Activity: Inactive (06/28/2022)   Exercise Vital Sign    Days of Exercise per Week: 0 days    Minutes of Exercise per Session: 0 min  Stress: Stress Concern Present (06/28/2022)   [place]    Feeling of Stress : To some extent  Social Connections: Moderately Integrated (06/28/2022)   Social Connection and Isolation Panel [NHANES]    Frequency  of Communication with Friends and Family: More than three times a week    Frequency of Social Gatherings with Friends and Family: Never    Attends Religious Services: Never    Marine scientist or Organizations: Yes    Attends Archivist Meetings: Never    Marital Status: Living with partner    Tobacco Counseling Counseling given: Not Answered Tobacco comments: quit 05/2006   Clinical Intake:  Pre-visit preparation completed: Yes  Pain : 0-10 Pain Score: 5  Pain Type: Chronic pain Pain Location: Generalized Pain Descriptors / Indicators: Aching Pain Onset: More than a month ago  Pain Frequency: Constant Pain Relieving Factors: heat  Pain Relieving Factors: heat  BMI - recorded: 34.89 Nutritional Status: BMI > 30  Obese Nutritional Risks: None Diabetes: Yes CBG done?: No (pt did BS at home:101) Did pt. bring in CBG monitor from home?: No  How often do you need to have someone help you when you read instructions, pamphlets, or other written materials from your doctor or pharmacy?: 1 - Never  Diabetic?yes  Interpreter Needed?: No  Comments: lives w/fiance' Information entered by :: B.Raelynne Ludwick,LPN   Activities of Daily Living    06/28/2022    9:56 AM  In your present state of health, do you have any difficulty performing the following activities:  Hearing? 1  Vision? 1  Difficulty concentrating or making decisions? 0  Walking or climbing stairs? 1  Comment SOB  Dressing or bathing? 0  Doing errands, shopping? 0  Preparing Food and eating ? N  Using the Toilet? N  In the past six months, have you accidently leaked urine? Y  Do you have problems with loss of bowel control? N  Managing your Medications? N  Managing your Finances? N  Housekeeping or managing your Housekeeping? N    Patient Care Team: Birdie Sons, MD as PCP - General (Family Medicine) Rockey Situ Kathlene November, MD as PCP - Cardiology (Cardiology) Alisa Graff, [place] (Family  Medicine) Ronnald Collum, Lourdes Sledge, MD as Attending Physician (Endocrinology) Lorelee Cover., MD (Ophthalmology) Gardiner Barefoot, DPM as Consulting Physician (Podiatry) [place], [place], [place] as Social Worker  Indicate any recent Medical Services you may have received from other than [facility] providers in the past year (date may be approximate).     Assessment:   This is a routine wellness examination for Scott [name].  Hearing/Vision screen Hearing Screening - Comments:: Left ear diminshed hearing Vision Screening - Comments:: Glaucoma in left eye;adequate vision;readers Dr Gloriann Loan  Dietary issues and exercise activities discussed: Current Exercise Habits: The patient does not participate in regular exercise at present, Exercise limited by: neurologic condition(s);orthopedic condition(s);respiratory conditions(s)   Goals Addressed             This Visit's Progress    DIET - INCREASE LEAN PROTEINS   On track    Recommend to continue current diet plan of cutting out salt and fatty meats and focus on eating all lean meats.      LIFESTYLE - DECREASE FALLS RISK   Not on track    Recommend to remove any items from the home that may cause slips or trips.       Depression Screen    06/28/2022    9:51 AM 08/18/2021    2:01 PM 08/18/2021    2:00 PM 07/28/2021   10:19 AM 04/22/2021   10:31 AM 03/16/2021    2:07 PM 05/16/2020    2:25 PM  PHQ 2/9 Scores  PHQ - 2 Score 1 5 5 1 1 6 5   PHQ- 9 Score  10   8 18 20     Fall Risk    06/28/2022    9:47 AM 04/22/2021   10:37 AM 01/15/2021    3:41 PM 05/16/2020    2:25 PM 04/16/2020   11:27 AM  Fall Risk   Falls in the past year? 1 1 1 1 1   Number falls in past yr: 1 1 1 1 1   Injury with Fall? 1 0 1 1 0  Comment chose not to go to ER      Risk for  fall due to : No Fall Risks History of fall(s) History of fall(s);Impaired balance/gait;Medication side effect  Impaired balance/gait;Other (Comment)  Risk for fall due to: Comment     due to respiratory issues   Follow up Education provided;Falls prevention discussed Falls prevention discussed Falls prevention discussed  Falls prevention discussed    FALL RISK PREVENTION PERTAINING TO THE HOME:  Any stairs in or around the home? No ramp If so, are there any without handrails? No  Home free of loose throw rugs in walkways, pet beds, electrical cords, etc? yes Adequate lighting in your home to reduce risk of falls? Yes   ASSISTIVE DEVICES UTILIZED TO PREVENT FALLS:  Life alert? No  Use of a cane, walker or w/c? No  Grab bars in the bathroom? No  Shower chair or bench in shower? yes Elevated toilet seat or a handicapped toilet? No   Cognitive Function:        06/28/2022   10:06 AM 04/10/2018    1:54 PM  6CIT Screen  What Year? 0 points 0 points  What month? 0 points 0 points  What time? 0 points 0 points  Count back from 20 0 points 0 points  Months in reverse 0 points 0 points  Repeat phrase 0 points 0 points  Total Score 0 points 0 points    Immunizations Immunization History  Administered Date(s) Administered   Moderna Sars-Covid-2 Vaccination 07/25/2020   PFIZER(Purple Top)SARS-COV-2 Vaccination 06/23/2019, 07/18/2019   Tdap 02/20/2018    TDAP status: Up to date  Flu Vaccine status: Up to date  Pneumococcal vaccine status: Up to date  Covid-19 vaccine status: Completed vaccines  Qualifies for Shingles Vaccine? Yes   Zostavax completed No   Shingrix Completed?: No.    Education has been provided regarding the importance of this vaccine. Patient has been advised to call insurance company to determine out of pocket expense if they have not yet received this vaccine. Advised may also receive vaccine at local pharmacy or Health Dept. Verbalized acceptance and understanding.  Screening Tests Health Maintenance  Topic Date Due   OPHTHALMOLOGY EXAM  Never done   Diabetic kidney evaluation - Urine ACR  Never done   Zoster Vaccines- Shingrix (1 of 2) Never done    COLONOSCOPY (Pts 45-31yrs Insurance coverage will need to be confirmed)  Never done   HEMOGLOBIN A1C  02/15/2021   FOOT EXAM  03/07/2021   INFLUENZA VACCINE  Never done   COVID-19 Vaccine (4 - 2023-24 season) 12/04/2021   Diabetic kidney evaluation - eGFR measurement  11/12/2022   Medicare Annual Wellness (AWV)  06/28/2023   DTaP/Tdap/Td (2 - Td or Tdap) 02/21/2028   HIV Screening  Completed   HPV VACCINES  Aged Out    Health Maintenance  Health Maintenance Due  Topic Date Due   OPHTHALMOLOGY EXAM  Never done   Diabetic kidney evaluation - Urine ACR  Never done   Zoster Vaccines- Shingrix (1 of 2) Never done   COLONOSCOPY (Pts 45-91yrs Insurance coverage will need to be confirmed)  Never done   HEMOGLOBIN A1C  02/15/2021   FOOT EXAM  03/07/2021   INFLUENZA VACCINE  Never done   COVID-19 Vaccine (4 - 2023-24 season) 12/04/2021    Colorectal cancer screening: Type of screening: Colonoscopy. Completed no. Repeat every 5-10 years  Lung Cancer Screening: (Low Dose CT Chest recommended if Age 56-80 years, 30 pack-year currently smoking OR have quit w/in 15years.) does not qualify.   Lung Cancer Screening  Referral: no  Additional Screening:  Hepatitis C Screening: does not qualify; Completed yes  Vision Screening: Recommended annual ophthalmology exams for early detection of glaucoma and other disorders of the eye. Is the patient up to date with their annual eye exam?  yes Who is the provider or what is the name of the office in which the patient attends annual eye exams? Dr Gloriann Loan If pt is not established with a provider, would they like to be referred to a provider to establish care? No .   Dental Screening: Recommended annual dental exams for proper oral hygiene  Community Resource Referral / Chronic Care Management: CRR required this visit?  No   CCM required this visit?  No      Plan:     I have personally reviewed and noted the following in the patient's chart:    Medical and social history Use of alcohol, tobacco or illicit drugs  Current medications and supplements including opioid prescriptions. Patient is not currently taking opioid prescriptions. Functional ability and status Nutritional status Physical activity Advanced directives List of other physicians Hospitalizations, surgeries, and ER visits in previous 12 months Vitals Screenings to include cognitive, depression, and falls Referrals and appointments  In addition, I have reviewed and discussed with patient certain preventive protocols, quality metrics, and best practice recommendations. A written personalized care plan for preventive services as well as general preventive health recommendations were provided to patient.     Roger Shelter, LPN   [id number]   Nurse Notes: pt is functioning at his baseline: he relays he is in pain everyday but manages. He has no questions or concerns for this visit. Pt sue for visit with provider: appt made.

## 2022-08-06 ENCOUNTER — Encounter: Payer: Self-pay | Admitting: Family Medicine

## 2022-08-06 ENCOUNTER — Ambulatory Visit (INDEPENDENT_AMBULATORY_CARE_PROVIDER_SITE_OTHER): Payer: 59 | Admitting: Family Medicine

## 2022-08-06 VITALS — BP 105/70 | HR 96 | Ht 63.0 in | Wt 195.0 lb

## 2022-08-06 DIAGNOSIS — S058X2A Other injuries of left eye and orbit, initial encounter: Secondary | ICD-10-CM | POA: Diagnosis not present

## 2022-08-06 DIAGNOSIS — I5032 Chronic diastolic (congestive) heart failure: Secondary | ICD-10-CM

## 2022-08-06 DIAGNOSIS — L0100 Impetigo, unspecified: Secondary | ICD-10-CM

## 2022-08-06 DIAGNOSIS — J449 Chronic obstructive pulmonary disease, unspecified: Secondary | ICD-10-CM | POA: Diagnosis not present

## 2022-08-06 DIAGNOSIS — Z0000 Encounter for general adult medical examination without abnormal findings: Secondary | ICD-10-CM

## 2022-08-06 DIAGNOSIS — L219 Seborrheic dermatitis, unspecified: Secondary | ICD-10-CM | POA: Diagnosis not present

## 2022-08-06 DIAGNOSIS — Z125 Encounter for screening for malignant neoplasm of prostate: Secondary | ICD-10-CM

## 2022-08-06 DIAGNOSIS — Q039 Congenital hydrocephalus, unspecified: Secondary | ICD-10-CM

## 2022-08-06 DIAGNOSIS — Z794 Long term (current) use of insulin: Secondary | ICD-10-CM

## 2022-08-06 DIAGNOSIS — E1142 Type 2 diabetes mellitus with diabetic polyneuropathy: Secondary | ICD-10-CM

## 2022-08-06 DIAGNOSIS — Z1211 Encounter for screening for malignant neoplasm of colon: Secondary | ICD-10-CM

## 2022-08-06 MED ORDER — CIPROFLOXACIN HCL 0.3 % OP SOLN
1.0000 [drp] | OPHTHALMIC | 0 refills | Status: AC
Start: 2022-08-06 — End: 2022-08-13

## 2022-08-06 MED ORDER — MUPIROCIN 2 % EX OINT
TOPICAL_OINTMENT | CUTANEOUS | 2 refills | Status: DC
Start: 2022-08-06 — End: 2023-06-20

## 2022-08-06 MED ORDER — BUDESONIDE-FORMOTEROL FUMARATE 80-4.5 MCG/ACT IN AERO
2.0000 | INHALATION_SPRAY | Freq: Two times a day (BID) | RESPIRATORY_TRACT | 12 refills | Status: AC
Start: 2022-08-06 — End: ?

## 2022-08-06 MED ORDER — TRIAMCINOLONE ACETONIDE 0.5 % EX OINT
TOPICAL_OINTMENT | CUTANEOUS | 1 refills | Status: DC
Start: 2022-08-06 — End: 2023-06-20

## 2022-08-06 NOTE — Patient Instructions (Signed)
Please review the attached list of medications and notify my office if there are any errors.   Call [facility] Albertine Patricia at [phone] and make a follow up appointment with Dr. Audrea Muscat

## 2022-08-06 NOTE — Progress Notes (Signed)
I,Sha'taria Tyson,acting as a Neurosurgeon for Mila Merry, MD.,have documented all relevant documentation on the behalf of Mila Merry, MD,as directed by  Mila Merry, MD while in the presence of Mila Merry, MD.   Complete physical exam   Patient: Scott [name]   DOB: [birth year]   52 y.o. Male  MRN: [id number] Visit Date: 08/06/2022  Today's healthcare provider: Mila Merry, MD   No chief complaint on file.  Subjective    Scott [name] is a 52 y.o. male who presents today for a complete physical exam.  He reports consuming a low fat, low sodium, and high fiber and protein  diet. The patient does not participate in regular exercise at present. He generally feels between well and fairly well. He reports sleeping poorly. He does not have additional problems to discuss today.  HPI  -Eye Exam: 3 months ago -Colonoscopy: discuss with Dr.Selma Mink  Past Medical History:  Diagnosis Date  . Acid reflux   . Anxiety   . Arthritis   . Asthma   . CHF (congestive heart failure) (HCC)   . Depression   . Enteritis presumed infectious 06/11/2008  . Glaucoma   . Hyperlipidemia   . Hypertension   . Klippel-Feil syndrome    Past Surgical History:  Procedure Laterality Date  . COLOSTOMY    . DENVER SHUNT PLACEMENT     Social History   Socioeconomic History  . Marital status: Significant Other    Spouse name: Not on file  . Number of children: 0  . Years of education: Not on file  . Highest education level: Some college, no degree  Occupational History  . Occupation: disability  Tobacco Use  . Smoking status: Former    Packs/day: 1.00    Years: 10.00    Additional pack years: 0.00    Total pack years: 10.00    Types: Cigarettes    Quit date: 05/2006    Years since quitting: 16.2  . Smokeless tobacco: Never  . Tobacco comments:    quit 05/2006  Vaping Use  . Vaping Use: Former  . Devices: tried once  Substance and Sexual Activity  . Alcohol use: No    Alcohol/week: 0.0  standard drinks of alcohol  . Drug use: No  . Sexual activity: Not on file  Other Topics Concern  . Not on file  Social History Narrative  . Not on file   Social Determinants of Health   Financial Resource Strain: High Risk (06/28/2022)   Overall Financial Resource Strain (CARDIA)   . Difficulty of Paying Living Expenses: Hard  Food Insecurity: No Food Insecurity (06/28/2022)   Hunger Vital Sign   . Worried About Programme researcher, broadcasting/film/video in the Last Year: Never true   . Ran Out of Food in the Last Year: Never true  Transportation Needs: No Transportation Needs (06/28/2022)   PRAPARE - Transportation   . Lack of Transportation (Medical): No   . Lack of Transportation (Non-Medical): No  Physical Activity: Inactive (06/28/2022)   Exercise Vital Sign   . Days of Exercise per Week: 0 days   . Minutes of Exercise per Session: 0 min  Stress: Stress Concern Present (06/28/2022)   [employer] of Occupational Health - Occupational Stress Questionnaire   . Feeling of Stress : To some extent  Social Connections: Moderately Integrated (06/28/2022)   Social Connection and Isolation Panel [NHANES]   . Frequency of Communication with Friends and Family: More than three times a week   .  Frequency of Social Gatherings with Friends and Family: Never   . Attends Religious Services: Never   . Active Member of Clubs or Organizations: Yes   . Attends Banker Meetings: Never   . Marital Status: Living with partner  Intimate Partner Violence: Not At Risk (06/28/2022)   Humiliation, Afraid, Rape, and Kick questionnaire   . Fear of Current or Ex-Partner: No   . Emotionally Abused: No   . Physically Abused: No   . Sexually Abused: No   Family Status  Relation Name Status  . Mother  Deceased       passed away Aug 25, 2019  . Father  Deceased  . Sister  Alive  . MGM  Deceased  . MGF  Deceased  . PGM  Deceased  . PGF  Deceased   Family History  Problem Relation Age of Onset  . Multiple  sclerosis Mother   . Colon cancer Father   . Heart disease Father   . Diabetes Sister   . Lung cancer Maternal Grandmother   . Thyroid cancer Maternal Grandfather   . Ovarian cancer Paternal Grandmother   . Cancer Paternal Grandfather    Allergies  Allergen Reactions  . Codeine Shortness Of Breath    Patient Care Team: Malva Limes, MD as PCP - General (Family Medicine) Mariah Milling, Tollie Pizza, MD as PCP - Cardiology (Cardiology) Delma Freeze, FNP (Family Medicine) Patrecia Pace, Delsa Sale, MD as Attending Physician (Endocrinology) Irene Limbo., MD (Ophthalmology) Helane Gunther, DPM as Consulting Physician (Podiatry) Adell, [place] M, [place] as Social Worker   Medications: Outpatient Medications Prior to Visit  Medication Sig  . albuterol (VENTOLIN HFA) 108 (90 Base) MCG/ACT inhaler Inhale 2 puffs into the lungs every 4 (four) hours as needed for wheezing or shortness of breath.  . ANDROGEL PUMP 20.25 MG/ACT (1.62%) GEL Apply 1 application topically daily.   . bisoprolol (ZEBETA) 5 MG tablet Take 0.5 tablets (2.5 mg total) by mouth daily.  . blood glucose meter kit and supplies Use 1-2 times a day as needed.Dispense based on patient and insurance preference. (FOR ICD-10 E10.9, E11.9).  . budesonide-formoterol (SYMBICORT) 80-4.5 MCG/ACT inhaler  (Patient not taking: Reported on 10/21/2021)  . cabergoline (DOSTINEX) 0.5 MG tablet Take by mouth.  . dapagliflozin propanediol (FARXIGA) 10 MG TABS tablet Take 10 mg by mouth daily.  Marland Kitchen gabapentin (NEURONTIN) 300 MG capsule Take 1 capsule (300 mg total) by mouth 2 (two) times daily.  Marland Kitchen glucose blood (ONETOUCH VERIO) test strip USE TO CHECK BLOOD SUGAR EVERY DAY  . hydrochlorothiazide (HYDRODIURIL) 12.5 MG tablet Take 12.5 mg by mouth every morning.  . Lancets (ONETOUCH DELICA PLUS LANCET30G) MISC 1 each by Does not apply route daily.  . Lancets (ONETOUCH ULTRASOFT) lancets Use with testing once daily and as neededUse as instructed (Patient  not taking: Reported on 10/21/2021)  . latanoprost (XALATAN) 0.005 % ophthalmic solution Place 1 drop into both eyes at bedtime.  Marland Kitchen OZEMPIC, 1 MG/DOSE, 4 MG/3ML SOPN Inject 2 mg into the skin once a week. (Patient not taking: Reported on 11/11/2021)  . PARoxetine (PAXIL) 40 MG tablet Take 1 tablet (40 mg total) by mouth daily.  . potassium chloride SA (KLOR-CON M) 20 MEQ tablet TAKE 2 TABLETS(40 MEQ) BY MOUTH TWICE DAILY  . spironolactone (ALDACTONE) 50 MG tablet Take 1 tablet (50 mg total) by mouth 2 (two) times daily.  Marland Kitchen torsemide (DEMADEX) 20 MG tablet TAKE 3 TABLETS(60 MG) BY MOUTH DAILY  . TRULICITY 0.75 MG/0.5ML  SOPN Inject into the skin.  . TRULICITY 1.5 MG/0.5ML SOPN Inject 1.5 mg into the skin once a week.   No facility-administered medications prior to visit.    Review of Systems  Constitutional: Negative.   HENT:  Positive for congestion, ear pain, hearing loss, mouth sores, nosebleeds, rhinorrhea, sneezing and sore throat.   Eyes:  Positive for photophobia, pain, redness and itching.  Respiratory:  Positive for apnea, cough, choking, chest tightness, shortness of breath and wheezing.   Cardiovascular:  Positive for chest pain, palpitations and leg swelling.  Gastrointestinal:  Positive for abdominal distention, abdominal pain, constipation, diarrhea, nausea, rectal pain and vomiting.  Endocrine: Positive for polydipsia and polyphagia.  Genitourinary:  Positive for difficulty urinating, dysuria and scrotal swelling.  Musculoskeletal:  Positive for myalgias and neck pain.  Skin:  Positive for rash and wound.  Allergic/Immunologic: Positive for food allergies.  Neurological:  Positive for weakness, numbness and headaches.  Hematological:  Bruises/bleeds easily.  Psychiatric/Behavioral:  Positive for behavioral problems and dysphoric mood. The patient is nervous/anxious.     {Labs  Heme  Chem  Endocrine  Serology  Results Review (optional):23779}  Objective    There were no  vitals taken for this visit. {Show previous vital signs (optional):23777}   Physical Exam  ***  Last depression screening scores    06/28/2022    9:51 AM 08/18/2021    2:01 PM 08/18/2021    2:00 PM  PHQ 2/9 Scores  PHQ - 2 Score 1 5 5   PHQ- 9 Score  10    Last fall risk screening    06/28/2022    9:47 AM  Fall Risk   Falls in the past year? 1  Number falls in past yr: 1  Injury with Fall? 1  Comment chose not to go to ER  Risk for fall due to : No Fall Risks  Follow up Education provided;Falls prevention discussed   Last Audit-C alcohol use screening    06/28/2022    9:50 AM  Alcohol Use Disorder Test (AUDIT)  1. How often do you have a drink containing alcohol? 0  2. How many drinks containing alcohol do you have on a typical day when you are drinking? 0  3. How often do you have six or more drinks on one occasion? 0  AUDIT-C Score 0   A score of 3 or more in women, and 4 or more in men indicates increased risk for alcohol abuse, EXCEPT if all of the points are from question 1   No results found for any visits on 08/06/22.  Assessment & Plan    Routine Health Maintenance and Physical Exam  Exercise Activities and Dietary recommendations  Goals     .  "I have a huge roach problem" (pt-stated)      Current Barriers:  Patient has a roach infestation in his home   Clinical Social Work Clinical Goal(s):  Over the next 90 days, patient will work with the landlord  to address needs related to treating the roach infestation in his home.  Interventions: Patient interviewed and appropriate assessments performed Confirmed that he is feeling much better due to conversation and plan made with landlord to address the roach problem in his home Confirmed that his best friend came down from [place] and helped get his home cleaned up, clutter removed and re-arranged to prepare for the extermination Confirmed that patient is feeling like his home is "livable" again Positive  reinforcement provided for progress made to  prepare for extermination Discussed need to follow with a mental health therapist for ongoing mental health support Discussed follow up with [facility], [facility] or the [facility]  Discussed plans with patient to follow up with one of the agencies listed above for ongoing mental health follow up. This Child psychotherapist provided patient with direct contact information for care management team to contact if needed in the future   Patient Self Care Activities:  Patient verbalizes understanding of plan to work with the landlord regarding roach problem, however is reluctant to contact the lanldord on his own Calls provider office for new concerns or questions Reluctance to contact lanldord on his own regarding roach infestation  Please see past updates related to this goal by clicking on the "Past Updates" button in the selected goal      .  Chronic Disease Management      CARE PLAN ENTRY (see longitudinal plan of care for additional care plan information)  Current Barriers:  Chronic Disease Management support and education needs related to HTN, COPD, CHF, DM and HLD.  Case Manager Clinical Goal(s): Over the next 120 days, patient will: Not require hospitalization or emergent care d/t complications r/t chronic illnesses. Attend all scheduled medical appointments. Take all medications as prescribed. Monitor BP and record readings. Maintain BP at goal of < 140/90. Weigh daily and maintain a log. Monitor fasting blood glucose readings and maintain a log. Follow recommended treatment plan to prevent COPD exacerbation. Follow recommended safety measures to prevent falls and injuries. Over the next 30 days, patient will: Follow up with PCP regarding request for a new Pulmonology referral. Follow up with the care management team regarding possible need for Care Guide referrals.    Interventions:  Inter-disciplinary care team  collaboration (see longitudinal plan of care) Reviewed medications. Advised to take all medications as prescribed. Advised to avoid abruptly discontinuing medications and notify provider if unable to tolerate prescribed regimen. Encouraged to notify care management team with concerns regarding medication management or prescription costs.  Reviewed FBS readings. Reports fasting readings have ranged from 100 to the low 120's. Denies s/sx r/t hypoglycemia or hyperglycemia.  Reviewed weight parameters and s/sx of complications r/t CHF. Reports weight has remained stable. Reports symptoms have been well controlled. Denies increased abdominal or lower extremity edema. Denies changes or decline in activity tolerance.    Reviewed plan r/t COPD self-management. Reports improvements since the last outreach and states COPD has been well controlled over the past few weeks and using his rescue inhalers less frequently. He continues to use home O2 at 2 L/min. Reports now using it mostly at night. He is currently establishing care with a Pulmonologist and will complete the initial evaluation next month.   Discussed plan for ongoing care management and follow-up. Reports doing well today. During our previous outreach he expressed concerns regarding possible need for additional assistance. Reports concerns regarding his home inspection have been resolved but agreed to update our team if this changes. Reports his condition has significantly improved since the initial outreach. Declines current need for in-home assistance. Reports his fiance is available to assist as needed. Denies urgent concerns or changes in care management needs. Agreeable to follow-up outreach next month.   Patient Self Care Activities:  Self administers medications  Attends scheduled provider appointments Calls pharmacy for medication refills Performs ADL's independently   Please see past updates related to this goal by clicking on the "Past  Updates" button in the selected goal      .  DIET - EAT MORE FRUITS AND VEGETABLES    .  DIET - INCREASE LEAN PROTEINS      Recommend to continue current diet plan of cutting out salt and fatty meats and focus on eating all lean meats.      Marland Kitchen  LIFESTYLE - DECREASE FALLS RISK      Recommend to remove any items from the home that may cause slips or trips.    .  Manage My Emotions      Timeframe:  Long-Range Goal Priority:  Medium Start Date:   07/28/21                          Expected End Date:   01/27/22                    Follow Up Date 08/11/21    - begin personal counseling - talk about feelings with a friend, family or spiritual advisor - practice positive thinking and self-talk    Why is this important?   When you are stressed, down or upset, your body reacts too.  For example, your blood pressure may get higher; you may have a headache or stomachache.  When your emotions get the best of you, your body's ability to fight off cold and flu gets weak.  These steps will help you manage your emotions.     Notes:         Immunization History  Administered Date(s) Administered  . Moderna Sars-Covid-2 Vaccination 07/25/2020  . PFIZER(Purple Top)SARS-COV-2 Vaccination 06/23/2019, 07/18/2019  . Tdap 02/20/2018    Health Maintenance  Topic Date Due  . OPHTHALMOLOGY EXAM  Never done  . Diabetic kidney evaluation - Urine ACR  Never done  . Zoster Vaccines- Shingrix (1 of 2) Never done  . COLONOSCOPY (Pts 45-88yrs Insurance coverage will need to be confirmed)  Never done  . HEMOGLOBIN A1C  02/15/2021  . FOOT EXAM  03/07/2021  . COVID-19 Vaccine (4 - 2023-24 season) 12/04/2021  . INFLUENZA VACCINE  11/04/2022  . Diabetic kidney evaluation - eGFR measurement  11/12/2022  . Medicare Annual Wellness (AWV)  06/28/2023  . DTaP/Tdap/Td (2 - Td or Tdap) 02/21/2028  . HIV Screening  Completed  . HPV VACCINES  Aged Out    Discussed health benefits of physical activity, and  encouraged him to engage in regular exercise appropriate for his age and condition.  ***  No follow-ups on file.     {provider attestation***:1}   Mila Merry, MD  [facility] [phone] (phone) [phone] (fax)  [facility]

## 2022-08-07 ENCOUNTER — Encounter: Payer: Self-pay | Admitting: Family Medicine

## 2022-08-11 ENCOUNTER — Telehealth: Payer: Self-pay | Admitting: Cardiovascular Disease

## 2022-08-11 ENCOUNTER — Encounter: Payer: Self-pay | Admitting: Family Medicine

## 2022-08-11 NOTE — Telephone Encounter (Signed)
Patient is calling that he is having issues with breathing. Patient stated that his oxygen levels have been very low. Patient stated that when he stops using his oxygen, his levels will drop down to 70-80%. Patient stated they will have go back on their oxygen to bring their levels back up. Please advise.

## 2022-08-11 NOTE — Telephone Encounter (Addendum)
Pt called c/o SOB with exertion for the past few days, swelling int the abdomen, and 3-5 lbs weight increase in 1 week. Pt stated he usually use 3 L O2 PRN but is now requiring constant O2 as he reports he de-stat to 70's without it.  Pt stated he is also having labored breathing without oxygen.  Ward Givens, NP made aware and recommended pt report to ER for further evaluations.  Pt verbalized understanding.

## 2022-08-14 ENCOUNTER — Telehealth: Payer: Self-pay | Admitting: Physician Assistant

## 2022-08-14 NOTE — Telephone Encounter (Addendum)
   The patient's fiance called the answering service after-hours today - page info indicated calling for med refill. Delayed callback due to high volume of calls and hospital emergency. Recent phone note 5/8 reviewed with increasing SOB, abdominal swelling, weight gain increase, increased O2 requirement, recommended to go to ED at the time. I tried to call back at listed number twice but got no response. LMOMTCB if needed.  Laurann Montana, PA-C

## 2022-08-16 ENCOUNTER — Telehealth: Payer: Self-pay | Admitting: Cardiovascular Disease

## 2022-08-16 MED ORDER — TORSEMIDE 20 MG PO TABS: ORAL_TABLET | ORAL | 0 refills | Status: DC

## 2022-08-16 NOTE — Telephone Encounter (Signed)
Patient is calling back stating they are completely out of this medication and need it called in asap.

## 2022-08-16 NOTE — Telephone Encounter (Signed)
Requested Prescriptions   Signed Prescriptions Disp Refills   torsemide (DEMADEX) 20 MG tablet 270 tablet 0    Sig: TAKE 3 TABLETS(60 MG) BY MOUTH DAILY    Authorizing Provider: Antonieta Iba    Ordering User: Kendrick Fries

## 2022-08-16 NOTE — Telephone Encounter (Signed)
*  STAT* If patient is at the pharmacy, call can be transferred to refill team.   1. Which medications need to be refilled? (please list name of each medication and dose if known) torsemide (DEMADEX) 20 MG tablet   2. Which pharmacy/location (including street and city if local pharmacy) is medication to be sent to?  [id number] - [place], [place] - [address] AT [facility] OF [address]    3. Do they need a 30 day or 90 day supply? 90

## 2022-08-26 ENCOUNTER — Ambulatory Visit: Payer: 59 | Admitting: Podiatry

## 2022-08-28 ENCOUNTER — Telehealth: Payer: Self-pay | Admitting: Internal Medicine

## 2022-08-28 NOTE — Telephone Encounter (Signed)
  Patient called the on call cardiology line due to unilateral leg swelling associated with pain, erythema and blisters that has been going on for the past 3 days and progressively worsening. His fiance tried to drain the blisters with a needle but his pain continues to worsen. I advised him to go to the ER now for evaluation.

## 2022-08-31 ENCOUNTER — Telehealth: Payer: Self-pay | Admitting: Cardiovascular Disease

## 2022-08-31 NOTE — Telephone Encounter (Signed)
Initial Assessment Questions   1. ONSET: "Hard to say when it happened, at least a week, maybe 2"  2. LOCATION: "Its my right leg up to my knee"  3. SEVERITY: "If I press on it, it will leave an indentation"  4. REDNESS: "Yes its red with blisters, boils and legions"  5. PAIN: "Its painful to the touch, I rate it at a 9 on a scale to 10, it feels like burning pins and needles"  6. FEVER: "No I don't have a fever"  7. CAUSE: "I'm not sure, I think its my heart failure"  8. MEDICAL HISTORY: "I have CHF"  9. RECURRENT SYMPTOM: "No"  10. OTHER SYMPTOMS: "I have chronic shortness of breath"   Care Advice:   1: GO TO ED NOW (OR PCP TRIAGE): * IF NO PCP (PRIMARY CARE PROVIDER) SECOND-LEVEL TRIAGE: You need to be seen within the next hour. Go to the ED/UCC at nearest Hospital. Leave as soon as you can.  2: ANOTHER ADULT SHOULD DRIVE: * It is better and safer if another adult drives instead of you.  3: PAIN AND FEVER MEDICINES: * For pain or fever relief, take either acetaminophen or ibuprofen. * They are over-the-counter (OTC) drugs that help treat both fever and pain. You can buy them at the drugstore. * Treat fevers above 101 F (38.3 C). The goal of fever therapy is to bring the fever down to a comfortable level. Remember that fever medicine usually lowers fever 2 degrees F (1 - 1 1/2 degrees C). * ACETAMINOPHEN - REGULAR STRENGTH TYLENOL: Take 650 mg (two 325 mg pills) by mouth every 4 to 6 hours as needed. Each Regular Strength Tylenol pill has 325 mg of acetaminophen. The most you should take is 10 pills a day (3,250 mg total).  * ACETAMINOPHEN - EXTRA STRENGTH TYLENOL: Take 1,000 mg (two 500 mg pills) every 6 to 8 hours as needed. Each Extra Strength Tylenol pill has 500 mg of acetaminophen. The most you should take is 6 pills a day (3,000 mg total).  * IBUPROFEN (E.G., MOTRIN, ADVIL): Take 400 mg (two 200 mg pills) by mouth every 6 hours. The most you should take is 6 pills a day  (1,200 mg total).  4: PAIN AND FEVER MEDICINES - EXTRA NOTES AND WARNINGS: * Follow these dosing instructions unless your doctor (or NP/PA) has told you to take a different dose. * Acetaminophen is thought to be safer than ibuprofen or naproxen in people over 44 years old. Acetaminophen is in many OTC and prescription medicines. It might be in more than one medicine that you are taking. You need to be careful and not take an overdose. An acetaminophen overdose can hurt the liver. [employer], the company that makes Tylenol, has different maximum dosage instructions for Tylenol in [place] than in the [place]. [employer], the company that makes Aleve, has different dosage maximum instructions for Aleve in [place] and the [place]. * CAUTION: Do not take acetaminophen if you have liver disease. * CAUTION: Do not take ibuprofen or naproxen if you have stomach problems, kidney disease, are pregnant, or have been told by your doctor to avoid this type of anti-inflammatory drug. Do not take ibuprofen or naproxen for more than 7 days without consulting your doctor.  If you take blood thinners, ibuprofen and naproxen can increase the risk of bleeding. * Before taking any medicine, read all the instructions on the package.  5: BRING MEDICINES: * Please bring a list  of your current medicines when you go to see the doctor. * It is also a good idea to bring the pill bottles too. This will help the doctor to make certain you are taking the right medicines and the right dose.   Patient wanted appointment to be seen by cardiologist. Informed patient that from his description of his signs and symptoms it did not appear to be cardiac related and that he should present to the nearest ED or UCC for further assessment and evaluation. Patient understood with read back

## 2022-08-31 NOTE — Telephone Encounter (Signed)
  Pt c/o swelling: STAT is pt has developed SOB within 24 hours  If swelling, where is the swelling located? Right leg   How much weight have you gained and in what time span?   Have you gained 3 pounds in a day or 5 pounds in a week? 5 lbs over night  Do you have a log of your daily weights (if so, list)?   Are you currently taking a fluid pill? Yes   Are you currently SOB? Yes, chronic SOB   Have you traveled recently? No    Pt said,his right leg is red and swollen. He said, he is afraid he will get an infection. He did gained 5 lbs over night so he doubled his fluid pill back to his old weight but his leg is still remain swollen

## 2022-09-02 ENCOUNTER — Ambulatory Visit (INDEPENDENT_AMBULATORY_CARE_PROVIDER_SITE_OTHER): Payer: 59 | Admitting: Family Medicine

## 2022-09-02 ENCOUNTER — Encounter: Payer: Self-pay | Admitting: Family Medicine

## 2022-09-02 VITALS — BP 113/73 | Temp 98.9°F | Ht 62.0 in | Wt 195.0 lb

## 2022-09-02 DIAGNOSIS — F418 Other specified anxiety disorders: Secondary | ICD-10-CM | POA: Diagnosis not present

## 2022-09-02 DIAGNOSIS — E1142 Type 2 diabetes mellitus with diabetic polyneuropathy: Secondary | ICD-10-CM | POA: Diagnosis not present

## 2022-09-02 DIAGNOSIS — Z1159 Encounter for screening for other viral diseases: Secondary | ICD-10-CM | POA: Insufficient documentation

## 2022-09-02 DIAGNOSIS — I5043 Acute on chronic combined systolic (congestive) and diastolic (congestive) heart failure: Secondary | ICD-10-CM

## 2022-09-02 DIAGNOSIS — L03115 Cellulitis of right lower limb: Secondary | ICD-10-CM | POA: Insufficient documentation

## 2022-09-02 MED ORDER — PAROXETINE HCL 40 MG PO TABS: 40.0000 mg | ORAL_TABLET | Freq: Every day | ORAL | 1 refills | Status: DC

## 2022-09-02 MED ORDER — SULFAMETHOXAZOLE-TRIMETHOPRIM 800-160 MG PO TABS: 1.0000 | ORAL_TABLET | Freq: Two times a day (BID) | ORAL | 0 refills | Status: DC

## 2022-09-02 NOTE — Assessment & Plan Note (Signed)
Acute, limited to RLE Start ABX Culture complete; site cleansed with close f/u

## 2022-09-02 NOTE — Assessment & Plan Note (Signed)
Acute weight gain, reports 4 days of 80 mg torsemide; has been able to lose the 5 additional lbs of fluid, and is now at previous weight Unclear what brought on fluid retention of 5# in 1 day Encouraged to complete labs, focus on diet and follow up with cards

## 2022-09-02 NOTE — Assessment & Plan Note (Signed)
Chronic, recently exacerbated Wishes to restart on paxil 40 mg Denies concern for self injury or self harm

## 2022-09-02 NOTE — Progress Notes (Signed)
Established patient visit   Patient: Scott [name]   DOB: December 30, 1970   52 y.o. Male  MRN: [id number] Visit Date: 09/02/2022  Today's healthcare provider: Jacky Kindle, FNP  Introduced to nurse practitioner role and practice setting.  All questions answered.  Discussed provider/patient relationship and expectations.  Chief Complaint  Patient presents with   Wound Check    Pt stated--front lower leg had a blister, drained with sterile needle--yellow drainage, redness, warm to touch, sore and OTC ointment--5 days   Subjective    Wound Check   HPI     Wound Check    Additional comments: Pt stated--front lower leg had a blister, drained with sterile needle--yellow drainage, redness, warm to touch, sore and OTC ointment--5 days      Last edited by Shelly Bombard, CMA on 09/02/2022  1:27 PM.      Medications: Outpatient Medications Prior to Visit  Medication Sig   albuterol (VENTOLIN HFA) 108 (90 Base) MCG/ACT inhaler Inhale 2 puffs into the lungs every 4 (four) hours as needed for wheezing or shortness of breath.   ANDROGEL PUMP 20.25 MG/ACT (1.62%) GEL Apply 1 application topically daily.    bisoprolol (ZEBETA) 5 MG tablet Take 0.5 tablets (2.5 mg total) by mouth daily.   blood glucose meter kit and supplies Use 1-2 times a day as needed.Dispense based on patient and insurance preference. (FOR ICD-10 E10.9, E11.9).   budesonide-formoterol (SYMBICORT) 80-4.5 MCG/ACT inhaler Inhale 2 puffs into the lungs 2 (two) times daily.   cabergoline (DOSTINEX) 0.5 MG tablet Take by mouth.   dapagliflozin propanediol (FARXIGA) 10 MG TABS tablet Take 10 mg by mouth daily.   gabapentin (NEURONTIN) 300 MG capsule Take 1 capsule (300 mg total) by mouth 2 (two) times daily.   glucose blood (ONETOUCH VERIO) test strip USE TO CHECK BLOOD SUGAR EVERY DAY   hydrochlorothiazide (HYDRODIURIL) 12.5 MG tablet Take 12.5 mg by mouth every morning.   Lancets (ONETOUCH DELICA PLUS LANCET30G) MISC 1 each by Does  not apply route daily.   Lancets (ONETOUCH ULTRASOFT) lancets Use with testing once daily and as neededUse as instructed   latanoprost (XALATAN) 0.005 % ophthalmic solution Place 1 drop into both eyes at bedtime.   mupirocin ointment (BACTROBAN) 2 % Apply to rash on chin twice a day until clear   potassium chloride SA (KLOR-CON M) 20 MEQ tablet TAKE 2 TABLETS(40 MEQ) BY MOUTH TWICE DAILY   spironolactone (ALDACTONE) 50 MG tablet Take 1 tablet (50 mg total) by mouth 2 (two) times daily.   torsemide (DEMADEX) 20 MG tablet TAKE 3 TABLETS(60 MG) BY MOUTH DAILY   triamcinolone ointment (KENALOG) 0.5 % Apply to rash in ears twice a day until clear   TRULICITY 1.5 MG/0.5ML SOPN Inject 1.5 mg into the skin once a week.   [DISCONTINUED] PARoxetine (PAXIL) 40 MG tablet Take 1 tablet (40 mg total) by mouth daily.   No facility-administered medications prior to visit.    Review of Systems  Last CBC Lab Results  Component Value Date   WBC 10.9 (H) 11/11/2021   HGB 15.6 11/11/2021   HCT 48.2 11/11/2021   MCV 93 11/11/2021   MCH 30.1 11/11/2021   RDW 13.2 11/11/2021   PLT 381 11/11/2021   Last metabolic panel Lab Results  Component Value Date   GLUCOSE 126 (H) 11/11/2021   NA 142 03/26/2022   K 4.6 03/26/2022   CL 95 (A) 03/26/2022   CO2 31 (A) 03/26/2022  BUN 11 03/26/2022   CREATININE 1.0 03/26/2022   EGFR 97.0 04/06/2022   CALCIUM 9.9 03/26/2022   PROT 6.9 11/11/2021   ALBUMIN 4.1 03/26/2022   LABGLOB 2.5 11/11/2021   AGRATIO 1.8 11/11/2021   BILITOT 0.3 11/11/2021   ALKPHOS 136 (A) 03/26/2022   AST 15 03/26/2022   ALT 19 03/26/2022   ANIONGAP 9 07/01/2020   Last lipids Lab Results  Component Value Date   CHOL 211 (H) 08/15/2020   HDL 75 08/15/2020   LDLCALC 116 (H) 08/15/2020   TRIG 115 08/15/2020   CHOLHDL 3.3 07/01/2020   Last hemoglobin A1c Lab Results  Component Value Date   HGBA1C 8.4 03/26/2022   Last thyroid functions Lab Results  Component Value Date    TSH 1.380 11/11/2021       Objective    BP 113/73 (BP Location: Left Arm, Patient Position: Sitting, Cuff Size: Normal)   Temp 98.9 F (37.2 C)   Ht 5\' 2"  (1.575 m)   Wt 195 lb (88.5 kg)   SpO2 95%   BMI 35.67 kg/m   BP Readings from Last 3 Encounters:  09/02/22 113/73  08/06/22 105/70  11/11/21 (!) 97/58   Wt Readings from Last 3 Encounters:  09/02/22 195 lb (88.5 kg)  08/06/22 195 lb (88.5 kg)  06/28/22 188 lb (85.3 kg)   SpO2 Readings from Last 3 Encounters:  09/02/22 95%  08/06/22 (!) 87%  10/21/21 97%   Physical Exam Vitals and nursing note reviewed.  Constitutional:      Appearance: Normal appearance. He is obese.  HENT:     Head: Normocephalic and atraumatic.  Cardiovascular:     Rate and Rhythm: Normal rate and regular rhythm.     Pulses: Normal pulses.     Heart sounds: Normal heart sounds.  Pulmonary:     Effort: Pulmonary effort is normal.     Breath sounds: Normal breath sounds.     Comments: DOE; on home concentrator O2 Musculoskeletal:        General: Normal range of motion.     Cervical back: Normal range of motion.  Skin:    General: Skin is warm and dry.     Capillary Refill: Capillary refill takes less than 2 seconds.     Findings: Erythema and lesion present.     Comments: RLE 1x2 1x1.5 ulcerations minimal depth  Neurological:     General: No focal deficit present.     Mental Status: He is alert and oriented to person, place, and time. Mental status is at baseline.  Psychiatric:        Mood and Affect: Mood normal.        Behavior: Behavior normal.        Thought Content: Thought content normal.        Judgment: Judgment normal.     No results found for any visits on 09/02/22.  Assessment & Plan     Problem List Items Addressed This Visit       Cardiovascular and Mediastinum   Acute on chronic combined systolic and diastolic congestive heart failure (HCC)    Acute weight gain, reports 4 days of 80 mg torsemide; has been able to  lose the 5 additional lbs of fluid, and is now at previous weight Unclear what brought on fluid retention of 5# in 1 day Encouraged to complete labs, focus on diet and follow up with cards      Relevant Orders   CBC with Differential/Platelet  Comprehensive Metabolic Panel (CMET)   B Nat Peptide   Lipid panel     Endocrine   Diabetic polyneuropathy associated with type 2 diabetes mellitus (HCC)    Chronic, previous uncontrolled 2 wounds noted on RLE; pt reported 4 wounds initially with fluid retention       Relevant Medications   PARoxetine (PAXIL) 40 MG tablet   Other Relevant Orders   Hemoglobin A1c     Other   Cellulitis of right lower extremity - Primary    Acute, limited to RLE Start ABX Culture complete; site cleansed with close f/u       Relevant Orders   Anaerobic and Aerobic Culture   Depression with anxiety    Chronic, recently exacerbated Wishes to restart on paxil 40 mg Denies concern for self injury or self harm       Relevant Medications   PARoxetine (PAXIL) 40 MG tablet   Encounter for hepatitis C screening test for low risk patient   Relevant Orders   Hepatitis C Antibody   Return in about 1 week (around 09/09/2022).     Leilani Merl, FNP, have reviewed all documentation for this visit. The documentation on 09/02/22 for the exam, diagnosis, procedures, and orders are all accurate and complete.  Scott Kindle, FNP  [facility] [phone] (phone) [phone] (fax)  [facility]

## 2022-09-02 NOTE — Assessment & Plan Note (Signed)
Chronic, previous uncontrolled 2 wounds noted on RLE; pt reported 4 wounds initially with fluid retention

## 2022-09-03 ENCOUNTER — Other Ambulatory Visit: Payer: Self-pay | Admitting: Family Medicine

## 2022-09-03 LAB — CBC WITH DIFFERENTIAL/PLATELET
Basophils Absolute: 0 10*3/uL (ref 0.0–0.2)
Hematocrit: 44.7 % (ref 37.5–51.0)
MCV: 91 fL (ref 79–97)
Monocytes Absolute: 1.1 10*3/uL — ABNORMAL HIGH (ref 0.1–0.9)
Neutrophils Absolute: 8.2 10*3/uL — ABNORMAL HIGH (ref 1.4–7.0)

## 2022-09-03 LAB — LIPID PANEL
LDL Chol Calc (NIH): 86 mg/dL (ref 0–99)
Triglycerides: 180 mg/dL — ABNORMAL HIGH (ref 0–149)

## 2022-09-03 LAB — HEMOGLOBIN A1C
Est. average glucose Bld gHb Est-mCnc: 169 mg/dL
Hgb A1c MFr Bld: 7.5 % — ABNORMAL HIGH (ref 4.8–5.6)

## 2022-09-03 LAB — COMPREHENSIVE METABOLIC PANEL
BUN: 18 mg/dL (ref 6–24)
Chloride: 91 mmol/L — ABNORMAL LOW (ref 96–106)
Sodium: 138 mmol/L (ref 134–144)
eGFR: 75 mL/min/{1.73_m2} (ref >59)

## 2022-09-03 NOTE — Progress Notes (Signed)
A1c is improved; however, not at goal of <7%. Keep up the good work- it is 7.5% from 8.4%. Continue to recommend balanced, lower carb meals. Smaller meal size, adding snacks. Choosing water as drink of choice and increasing purposeful exercise. LDL is also improved; however, not at goal of 55-70. Recommend titration with statin to assist. Culture pending

## 2022-09-04 LAB — LIPID PANEL
Chol/HDL Ratio: 3 ratio (ref 0.0–5.0)
Cholesterol, Total: 174 mg/dL (ref 100–199)
HDL: 58 mg/dL (ref >39)
VLDL Cholesterol Cal: 30 mg/dL (ref 5–40)

## 2022-09-04 LAB — COMPREHENSIVE METABOLIC PANEL
ALT: 11 IU/L (ref 0–44)
AST: 14 IU/L (ref 0–40)
Albumin/Globulin Ratio: 1.5 (ref 1.2–2.2)
Albumin: 4.1 g/dL (ref 3.8–4.9)
Alkaline Phosphatase: 140 IU/L — ABNORMAL HIGH (ref 44–121)
BUN/Creatinine Ratio: 15 (ref 9–20)
Bilirubin Total: 0.3 mg/dL (ref 0.0–1.2)
CO2: 34 mmol/L — ABNORMAL HIGH (ref 20–29)
Calcium: 9.1 mg/dL (ref 8.7–10.2)
Creatinine, Ser: 1.17 mg/dL (ref 0.76–1.27)
Globulin, Total: 2.7 g/dL (ref 1.5–4.5)
Glucose: 189 mg/dL — ABNORMAL HIGH (ref 70–99)
Potassium: 4.1 mmol/L (ref 3.5–5.2)
Total Protein: 6.8 g/dL (ref 6.0–8.5)

## 2022-09-04 LAB — CBC WITH DIFFERENTIAL/PLATELET
Basos: 0 %
EOS (ABSOLUTE): 0.1 10*3/uL (ref 0.0–0.4)
Eos: 1 %
Hemoglobin: 14.5 g/dL (ref 13.0–17.7)
Immature Grans (Abs): 0 10*3/uL (ref 0.0–0.1)
Immature Granulocytes: 0 %
Lymphocytes Absolute: 1.2 10*3/uL (ref 0.7–3.1)
Lymphs: 11 %
MCH: 29.6 pg (ref 26.6–33.0)
MCHC: 32.4 g/dL (ref 31.5–35.7)
Monocytes: 11 %
Neutrophils: 77 %
Platelets: 350 10*3/uL (ref 150–450)
RBC: 4.9 x10E6/uL (ref 4.14–5.80)
RDW: 12.9 % (ref 11.6–15.4)
WBC: 10.6 10*3/uL (ref 3.4–10.8)

## 2022-09-04 LAB — BRAIN NATRIURETIC PEPTIDE: BNP: 15.9 pg/mL (ref 0.0–100.0)

## 2022-09-04 LAB — HEPATITIS C ANTIBODY: Hep C Virus Ab: NONREACTIVE

## 2022-09-06 ENCOUNTER — Ambulatory Visit (INDEPENDENT_AMBULATORY_CARE_PROVIDER_SITE_OTHER): Payer: 59 | Admitting: Family Medicine

## 2022-09-06 ENCOUNTER — Encounter: Payer: Self-pay | Admitting: Family Medicine

## 2022-09-06 VITALS — BP 130/70 | HR 79 | Ht 62.0 in | Wt 198.0 lb

## 2022-09-06 DIAGNOSIS — L03115 Cellulitis of right lower limb: Secondary | ICD-10-CM | POA: Diagnosis not present

## 2022-09-06 DIAGNOSIS — R0902 Hypoxemia: Secondary | ICD-10-CM

## 2022-09-06 DIAGNOSIS — J984 Other disorders of lung: Secondary | ICD-10-CM

## 2022-09-06 LAB — ANAEROBIC AND AEROBIC CULTURE

## 2022-09-06 LAB — SPECIMEN STATUS REPORT

## 2022-09-06 NOTE — Progress Notes (Signed)
Established patient visit   Patient: Scott [name]   DOB: 07/26/1970   52 y.o. Male  MRN: [id number] Visit Date: 09/06/2022  Today's healthcare provider: Mila Merry, MD   Chief Complaint  Patient presents with   Follow-up    Wound-   Subjective    HPI Discussed the use of AI scribe software for clinical note transcription with the patient, who gave verbal consent to proceed.  History of Present Illness   Presents for follow up right lower Leg Infection seen 1 week ago by Merita Norton: Wound culture showed preliminary results of gram negative rods. The wound was previously drained and the patient was prescribed Bactrim DS 1 pill twice a day for 2 weeks. He states it feels much better but not healed. The wound dressing is changed daily, and there is no application of any ointment. The patient also reports discoloration of the toes.   He has a history of chronic lung disease, now with worsening dyspnea and low oxygen saturation levels, particularly at night. The patient reports feeling lightheaded and confused when his oxygen levels dropping to the 60s at night. Despite using a portable oxygen concentrator, the patient struggles to maintain oxygen saturation above 80%. The patient also reports that the concentrator stopped working one night, but was functioning again by morning. He states it only supplies him enough oxygen for 2 lpm for 2 or 3 hours and he does not have a oxygen tank. He was previously followed by Dr. Jayme Cloud and apparently missed several appointment and had to transfer to [facility] pulmonology, but has not had any follow up for the last two years.       Medications: Outpatient Medications Prior to Visit  Medication Sig   albuterol (VENTOLIN HFA) 108 (90 Base) MCG/ACT inhaler Inhale 2 puffs into the lungs every 4 (four) hours as needed for wheezing or shortness of breath.   ANDROGEL PUMP 20.25 MG/ACT (1.62%) GEL Apply 1 application topically daily.    bisoprolol  (ZEBETA) 5 MG tablet Take 0.5 tablets (2.5 mg total) by mouth daily.   blood glucose meter kit and supplies Use 1-2 times a day as needed.Dispense based on patient and insurance preference. (FOR ICD-10 E10.9, E11.9).   budesonide-formoterol (SYMBICORT) 80-4.5 MCG/ACT inhaler Inhale 2 puffs into the lungs 2 (two) times daily.   cabergoline (DOSTINEX) 0.5 MG tablet Take by mouth.   dapagliflozin propanediol (FARXIGA) 10 MG TABS tablet Take 10 mg by mouth daily.   gabapentin (NEURONTIN) 300 MG capsule Take 1 capsule (300 mg total) by mouth 2 (two) times daily.   glucose blood (ONETOUCH VERIO) test strip USE TO CHECK BLOOD SUGAR EVERY DAY   hydrochlorothiazide (HYDRODIURIL) 12.5 MG tablet Take 12.5 mg by mouth every morning.   Lancets (ONETOUCH DELICA PLUS LANCET30G) MISC 1 each by Does not apply route daily.   Lancets (ONETOUCH ULTRASOFT) lancets Use with testing once daily and as neededUse as instructed   latanoprost (XALATAN) 0.005 % ophthalmic solution Place 1 drop into both eyes at bedtime.   mupirocin ointment (BACTROBAN) 2 % Apply to rash on chin twice a day until clear   PARoxetine (PAXIL) 40 MG tablet Take 1 tablet (40 mg total) by mouth daily.   potassium chloride SA (KLOR-CON M) 20 MEQ tablet TAKE 2 TABLETS(40 MEQ) BY MOUTH TWICE DAILY   spironolactone (ALDACTONE) 50 MG tablet Take 1 tablet (50 mg total) by mouth 2 (two) times daily.   sulfamethoxazole-trimethoprim (BACTRIM DS) 800-160 MG tablet  Take 1 tablet by mouth 2 (two) times daily.   torsemide (DEMADEX) 20 MG tablet TAKE 3 TABLETS(60 MG) BY MOUTH DAILY   triamcinolone ointment (KENALOG) 0.5 % Apply to rash in ears twice a day until clear   TRULICITY 1.5 MG/0.5ML SOPN Inject 1.5 mg into the skin once a week.   No facility-administered medications prior to visit.    Review of Systems  Constitutional:  Negative for appetite change, chills and fever.  Respiratory:  Negative for chest tightness, shortness of breath and wheezing.    Cardiovascular:  Negative for chest pain and palpitations.  Gastrointestinal:  Negative for abdominal pain, nausea and vomiting.       Objective    BP 130/70 (BP Location: Right Arm, Patient Position: Sitting, Cuff Size: Large)   Pulse 79   Ht 5\' 2"  (1.575 m)   Wt 198 lb (89.8 kg)   SpO2 95%   BMI 36.21 kg/m   Oximetry = 78 % on room air. 95% on 3 lpm oxygen concentrator Physical Exam   General: Appearance:    Obese male in no acute distress  Eyes:    PERRL, conjunctiva/corneas clear, EOM's intact       Lungs:     Clear to auscultation bilaterally, respirations unlabored  Heart:    Normal heart rate. Normal rhythm. No murmurs, rubs, or gallops.    MS:   All extremities are intact.    Neurologic:   Awake, alert, oriented x 3. No apparent focal neurological defect.          Assessment & Plan     Assessment and Plan    The wound appears to be improving with less pain and no signs of abscess. -Continue Bactrim DS as prescribed. -Change dressing every 2 days. -Await final culture results and adjust treatment plan accordingly.  Chronic Lung Disease: Patient reports worsening shortness of breath and low oxygen saturation levels, even with the use of a portable oxygen concentrator. No new or acute changes noted on lung auscultation. -Refer to pulmonologist (Dr. Marcos Eke) for further evaluation and management. -Advise patient to use oxygen most of the time until seen by pulmonologist. -If oxygen saturation cannot be maintained above 89% even with oxygen, patient should go to the emergency room.     Addressed extensive list of chronic and acute medical problems today requiring 40 minutes reviewing his medical record, counseling patient regarding his conditions and coordination of care.            Mila Merry, MD  [facility] [phone] (phone) [phone] (fax)  [facility]

## 2022-09-07 ENCOUNTER — Other Ambulatory Visit: Payer: Self-pay | Admitting: Cardiovascular Disease

## 2022-09-07 LAB — ANAEROBIC AND AEROBIC CULTURE

## 2022-09-07 NOTE — Progress Notes (Signed)
Culture is in process still; however, correct ABX used to assist with infection.

## 2022-09-08 LAB — ANAEROBIC AND AEROBIC CULTURE

## 2022-09-13 ENCOUNTER — Other Ambulatory Visit: Payer: Self-pay | Admitting: Cardiovascular Disease

## 2022-09-13 ENCOUNTER — Encounter: Payer: Self-pay | Admitting: Cardiovascular Disease

## 2022-09-13 NOTE — Telephone Encounter (Signed)
error 

## 2022-09-20 ENCOUNTER — Ambulatory Visit (INDEPENDENT_AMBULATORY_CARE_PROVIDER_SITE_OTHER): Payer: 59 | Admitting: Podiatry

## 2022-09-20 VITALS — BP 131/86

## 2022-09-20 DIAGNOSIS — B351 Tinea unguium: Secondary | ICD-10-CM | POA: Diagnosis not present

## 2022-09-20 DIAGNOSIS — M79609 Pain in unspecified limb: Secondary | ICD-10-CM

## 2022-09-20 DIAGNOSIS — M7989 Other specified soft tissue disorders: Secondary | ICD-10-CM | POA: Diagnosis not present

## 2022-09-20 DIAGNOSIS — E1142 Type 2 diabetes mellitus with diabetic polyneuropathy: Secondary | ICD-10-CM | POA: Diagnosis not present

## 2022-09-20 NOTE — Progress Notes (Unsigned)
      Established patient visit   Patient: Scott [name]   DOB: 1970/05/02   52 y.o. Male  MRN: [id number] Visit Date: 09/21/2022  Today's healthcare provider: Mila Merry, MD   No chief complaint on file.  Subjective    HPI  ***  Medications: Outpatient Medications Prior to Visit  Medication Sig   albuterol (VENTOLIN HFA) 108 (90 Base) MCG/ACT inhaler Inhale 2 puffs into the lungs every 4 (four) hours as needed for wheezing or shortness of breath.   ANDROGEL PUMP 20.25 MG/ACT (1.62%) GEL Apply 1 application topically daily.    bisoprolol (ZEBETA) 5 MG tablet TAKE 1/2 TABLET(2.5 MG) BY MOUTH DAILY   blood glucose meter kit and supplies Use 1-2 times a day as needed.Dispense based on patient and insurance preference. (FOR ICD-10 E10.9, E11.9).   budesonide-formoterol (SYMBICORT) 80-4.5 MCG/ACT inhaler Inhale 2 puffs into the lungs 2 (two) times daily.   cabergoline (DOSTINEX) 0.5 MG tablet Take by mouth.   dapagliflozin propanediol (FARXIGA) 10 MG TABS tablet Take 10 mg by mouth daily.   gabapentin (NEURONTIN) 300 MG capsule Take 1 capsule (300 mg total) by mouth 2 (two) times daily.   glucose blood (ONETOUCH VERIO) test strip USE TO CHECK BLOOD SUGAR EVERY DAY   hydrochlorothiazide (HYDRODIURIL) 12.5 MG tablet Take 12.5 mg by mouth every morning.   Lancets (ONETOUCH DELICA PLUS LANCET30G) MISC 1 each by Does not apply route daily.   Lancets (ONETOUCH ULTRASOFT) lancets Use with testing once daily and as neededUse as instructed   latanoprost (XALATAN) 0.005 % ophthalmic solution Place 1 drop into both eyes at bedtime.   mupirocin ointment (BACTROBAN) 2 % Apply to rash on chin twice a day until clear   PARoxetine (PAXIL) 40 MG tablet Take 1 tablet (40 mg total) by mouth daily.   potassium chloride SA (KLOR-CON M) 20 MEQ tablet Take 2 tablets (40 mEq total) by mouth 2 (two) times daily. Please call the office to schedule follow up prior to further refills.   spironolactone (ALDACTONE)  50 MG tablet Take 1 tablet (50 mg total) by mouth 2 (two) times daily.   sulfamethoxazole-trimethoprim (BACTRIM DS) 800-160 MG tablet Take 1 tablet by mouth 2 (two) times daily.   torsemide (DEMADEX) 20 MG tablet TAKE 3 TABLETS(60 MG) BY MOUTH DAILY   triamcinolone ointment (KENALOG) 0.5 % Apply to rash in ears twice a day until clear   TRULICITY 1.5 MG/0.5ML SOPN Inject 1.5 mg into the skin once a week.   No facility-administered medications prior to visit.    Review of Systems  {Labs  Heme  Chem  Endocrine  Serology  Results Review (optional):23779}   Objective    There were no vitals taken for this visit. {Show previous vital signs (optional):23777}  Physical Exam  ***  No results found for any visits on 09/21/22.  Assessment & Plan     ***  No follow-ups on file.      {provider attestation***:1}   Mila Merry, MD  [facility] [phone] (phone) [phone] (fax)  [facility]

## 2022-09-21 ENCOUNTER — Encounter: Payer: Self-pay | Admitting: Family Medicine

## 2022-09-21 ENCOUNTER — Ambulatory Visit (INDEPENDENT_AMBULATORY_CARE_PROVIDER_SITE_OTHER): Payer: 59 | Admitting: Family Medicine

## 2022-09-21 VITALS — BP 100/74 | HR 71 | Temp 98.0°F | Resp 16 | Wt 198.0 lb

## 2022-09-21 DIAGNOSIS — L03115 Cellulitis of right lower limb: Secondary | ICD-10-CM | POA: Diagnosis not present

## 2022-09-21 DIAGNOSIS — M7989 Other specified soft tissue disorders: Secondary | ICD-10-CM | POA: Diagnosis not present

## 2022-09-21 DIAGNOSIS — Q761 Klippel-Feil syndrome: Secondary | ICD-10-CM

## 2022-09-21 DIAGNOSIS — R0902 Hypoxemia: Secondary | ICD-10-CM

## 2022-09-21 DIAGNOSIS — J984 Other disorders of lung: Secondary | ICD-10-CM

## 2022-09-21 DIAGNOSIS — J449 Chronic obstructive pulmonary disease, unspecified: Secondary | ICD-10-CM

## 2022-09-21 DIAGNOSIS — M79662 Pain in left lower leg: Secondary | ICD-10-CM | POA: Diagnosis not present

## 2022-09-21 DIAGNOSIS — R410 Disorientation, unspecified: Secondary | ICD-10-CM

## 2022-09-21 DIAGNOSIS — R253 Fasciculation: Secondary | ICD-10-CM

## 2022-09-22 ENCOUNTER — Ambulatory Visit
Admission: RE | Admit: 2022-09-22 | Discharge: 2022-09-22 | Disposition: A | Discharge status: Home / Self Care | Payer: 59 | Source: Ambulatory Visit | Attending: Family Medicine | Admitting: Family Medicine

## 2022-09-22 DIAGNOSIS — M79662 Pain in left lower leg: Secondary | ICD-10-CM | POA: Insufficient documentation

## 2022-09-22 DIAGNOSIS — M7989 Other specified soft tissue disorders: Secondary | ICD-10-CM | POA: Diagnosis present

## 2022-09-22 NOTE — Progress Notes (Signed)
  Subjective:  Patient ID: Scott [name], male    DOB: 07/25/1970,  MRN: [id number]  Scott [name] presents to clinic today for at risk foot care with history of diabetic neuropathy and painful elongated mycotic toenails 1-5 bilaterally which are tender when wearing enclosed shoe gear. Pain is relieved with periodic professional debridement. Chief Complaint  Patient presents with   Nail Problem    DFC,Referring Provider Malva Limes, MD,lov:06/24,A1C:7.4,BS:134       New problem(s):  Patient presents with significant other on today's visit. He states his right foot has been swollen for the past few days; also relates pain in right calf. Recently treated for blisters on his right leg which are resolving after taking oral antibiotics.  PCP is Malva Limes, MD.  Allergies  Allergen Reactions   Codeine Shortness Of Breath   Review of Systems: Negative except as noted in the HPI. Objective:   Vitals:   09/20/22 1414  BP: 131/86   Constitutional Scott [name] is a very pleasant 52 y.o. male, obese in NAD. AAO x 3.   Vascular Vascular Examination: Capillary refill time immediate b/l. Vascular status intact b/l with palpable pedal pulses. Pedal hair absent. +Pain with calf compression RLE; no pain with calf compression LLE. Skin temperature gradient with increased warmth noted RLE; WNL LLE. No cyanosis or clubbing b/l. Unilateral edema right LE.  Neurological Examination: Pt has subjective symptoms of neuropathy. Protective sensation diminished with 10g monofilament b/l.  Dermatological Examination: Healing blisters anterior aspect mid 1/3 RLE from previous episode of cellulitis.  Toenails 1-5 b/l thick, discolored, elongated with subungual debris and pain on dorsal palpation.   No hyperkeratotic nor porokeratotic lesions present on today's visit.  Musculoskeletal Examination: Deferred muscle strength examination d/t symptomatic RLE. Utilizes rollator for ambulation  assistance.  Radiographs: None  DME: O2 concentrator  Last A1c:      Latest Ref Rng & Units 09/02/2022    2:02 PM 03/26/2022   12:00 AM  Hemoglobin A1C  Hemoglobin-A1c 4.8 - 5.6 % 7.5  8.4         This result is from an external source.       Assessment:   1. Pain due to onychomycosis of nail   2. Swelling of right lower extremity   3. Diabetic polyneuropathy associated with type 2 diabetes mellitus (HCC)    Plan:  Patient was evaluated and treated and all questions answered. Consent given for treatment as described below: -Discussed swelling RLE highly suspicious for DVT; recommended visit to ED or contact PCP. Patient states he will call PCP's office. -Continue foot and shoe inspections daily. Monitor blood glucose per PCP/Endocrinologist's recommendations. -Patient to continue soft, supportive shoe gear daily. -Toenails 1-5 b/l were debrided in length and girth with sterile nail nippers and dremel without iatrogenic bleeding.  -Patient/POA to call should there be question/concern in the interim.  Return in about 3 months (around 12/21/2022).  Freddie Breech, DPM

## 2022-09-23 ENCOUNTER — Other Ambulatory Visit: Payer: Self-pay | Admitting: Family Medicine

## 2022-09-23 DIAGNOSIS — L03115 Cellulitis of right lower limb: Secondary | ICD-10-CM

## 2022-09-23 MED ORDER — CEFDINIR 300 MG PO CAPS
300.0000 mg | ORAL_CAPSULE | Freq: Two times a day (BID) | ORAL | 0 refills | Status: DC
Start: 2022-09-23 — End: 2023-09-08

## 2022-11-10 ENCOUNTER — Ambulatory Visit: Payer: 59 | Admitting: Family Medicine

## 2022-11-10 VITALS — BP 111/65 | HR 73 | Temp 98.6°F | Ht 62.0 in | Wt 196.0 lb

## 2022-11-10 DIAGNOSIS — E1142 Type 2 diabetes mellitus with diabetic polyneuropathy: Secondary | ICD-10-CM

## 2022-11-10 DIAGNOSIS — J9611 Chronic respiratory failure with hypoxia: Secondary | ICD-10-CM | POA: Diagnosis not present

## 2022-11-10 DIAGNOSIS — L03115 Cellulitis of right lower limb: Secondary | ICD-10-CM

## 2022-11-10 DIAGNOSIS — R609 Edema, unspecified: Secondary | ICD-10-CM | POA: Diagnosis not present

## 2022-11-10 MED ORDER — TRULICITY 1.5 MG/0.5ML ~~LOC~~ SOAJ
1.5000 mg | SUBCUTANEOUS | 3 refills | Status: DC
Start: 2022-11-10 — End: 2023-09-26

## 2022-11-10 MED ORDER — TORSEMIDE 20 MG PO TABS
ORAL_TABLET | ORAL | 0 refills | Status: DC
Start: 2022-11-10 — End: 2023-06-10

## 2022-11-10 MED ORDER — MUPIROCIN CALCIUM 2 % EX CREA
1.0000 | TOPICAL_CREAM | Freq: Two times a day (BID) | CUTANEOUS | 1 refills | Status: DC
Start: 2022-11-10 — End: 2023-06-20

## 2022-11-10 NOTE — Progress Notes (Signed)
Established patient visit   Patient: Scott [name]   DOB: April 24, 1970   51 y.o. Male  MRN: [id number] Visit Date: 11/10/2022  Today's healthcare provider: Mila Merry, MD   Chief Complaint  Patient presents with   Medication managment    Patient has been seeing Endocrinology who is now retiring.  He has not yet been able to get a new one and will need a referral.  While waiting on that he will need his Torsemide refilled and is asking if we could do that temporarily.   Leg Swelling    Patient had been treated for cellulitis of his right leg.  He states it is better but still feels there is fluid build up.   Subjective    HPI HPI     Medication managment    Additional comments: Patient has been seeing Endocrinology who is now retiring.  He has not yet been able to get a new one and will need a referral.  While waiting on that he will need his Torsemide refilled and is asking if we could do that temporarily.        Leg Swelling    Additional comments: Patient had been treated for cellulitis of his right leg.  He states it is better but still feels there is fluid build up.      Last edited by Adline Peals, CMA on 11/10/2022  9:08 AM.      Medications: Outpatient Medications Prior to Visit  Medication Sig   albuterol (VENTOLIN HFA) 108 (90 Base) MCG/ACT inhaler Inhale 2 puffs into the lungs every 4 (four) hours as needed for wheezing or shortness of breath.   ANDROGEL PUMP 20.25 MG/ACT (1.62%) GEL Apply 1 application topically daily.    bisoprolol (ZEBETA) 5 MG tablet TAKE 1/2 TABLET(2.5 MG) BY MOUTH DAILY   cabergoline (DOSTINEX) 0.5 MG tablet Take by mouth.   dapagliflozin propanediol (FARXIGA) 10 MG TABS tablet Take 10 mg by mouth daily.   latanoprost (XALATAN) 0.005 % ophthalmic solution Place 1 drop into both eyes at bedtime.   PARoxetine (PAXIL) 40 MG tablet Take 1 tablet (40 mg total) by mouth daily.   potassium chloride SA (KLOR-CON M) 20 MEQ tablet Take 2  tablets (40 mEq total) by mouth 2 (two) times daily. Please call the office to schedule follow up prior to further refills.   spironolactone (ALDACTONE) 50 MG tablet Take 1 tablet (50 mg total) by mouth 2 (two) times daily.   triamcinolone ointment (KENALOG) 0.5 % Apply to rash in ears twice a day until clear   [DISCONTINUED] torsemide (DEMADEX) 20 MG tablet TAKE 3 TABLETS(60 MG) BY MOUTH DAILY   blood glucose meter kit and supplies Use 1-2 times a day as needed.Dispense based on patient and insurance preference. (FOR ICD-10 E10.9, E11.9).   budesonide-formoterol (SYMBICORT) 80-4.5 MCG/ACT inhaler Inhale 2 puffs into the lungs 2 (two) times daily. (Patient not taking: Reported on 11/10/2022)   gabapentin (NEURONTIN) 300 MG capsule Take 1 capsule (300 mg total) by mouth 2 (two) times daily. (Patient not taking: Reported on 11/10/2022)   glucose blood (ONETOUCH VERIO) test strip USE TO CHECK BLOOD SUGAR EVERY DAY   hydrochlorothiazide (HYDRODIURIL) 12.5 MG tablet Take 12.5 mg by mouth every morning. (Patient not taking: Reported on 11/10/2022)   Lancets (ONETOUCH DELICA PLUS LANCET30G) MISC 1 each by Does not apply route daily.   Lancets (ONETOUCH ULTRASOFT) lancets Use with testing once daily and as neededUse as instructed  mupirocin ointment (BACTROBAN) 2 % Apply to rash on chin twice a day until clear (Patient not taking: Reported on 11/10/2022)   ofloxacin (OCUFLOX) 0.3 % ophthalmic solution Place 1 drop into the left eye. (Patient not taking: Reported on 11/10/2022)   [DISCONTINUED] TRULICITY 1.5 MG/0.5ML SOPN Inject 1.5 mg into the skin once a week. (Patient not taking: Reported on 11/10/2022)   No facility-administered medications prior to visit.    Review of Systems  Constitutional:  Negative for appetite change, chills and fever.  Respiratory:  Positive for shortness of breath. Negative for chest tightness and wheezing.   Cardiovascular:  Negative for chest pain and palpitations.  Gastrointestinal:   Negative for abdominal pain, nausea and vomiting.       Objective    BP 111/65 (BP Location: Left Arm, Patient Position: Sitting, Cuff Size: Normal)   Pulse 73   Temp 98.6 F (37 C) (Oral)   Ht 5\' 2"  (1.575 m)   Wt 196 lb (88.9 kg)   SpO2 95%   BMI 35.85 kg/m  (on 2lpm oxygen)  Physical Exam       Assessment & Plan     1. Cellulitis of right lower extremity Resolved, but still at risk for infection with several small irritated sores on skin. - mupirocin cream (BACTROBAN) 2 %; Apply 1 Application topically 2 (two) times daily.  Dispense: 30 g; Refill: 1  2. Diabetic polyneuropathy associated with type 2 diabetes mellitus (HCC) His previous endocrinologist, Dr. Patrecia Pace, has retired and he is out of Trulicity.   - Ambulatory referral to Endocrinology refill TRULICITY 1.5 MG/0.5ML SOPN; Inject 1.5 mg into the skin once a week.  Dispense: 2 mL; Refill: 3  3. Edema, unspecified type Refill  torsemide (DEMADEX) 20 MG tablet; TAKE 3 TABLETS(60 MG) BY MOUTH DAILY  Dispense: 270 tablet; Refill: 0 previously prescribed by endocrinology.   4. Chronic respiratory failure with hypoxia (HCC) O2 saturations much better today since he is wearing oxygen. [facility] denied referral, apparently discharged from practice in past. He was last seen at [facility] in 2022 and given contact number to schedule a follow up.       The entirety of the information documented in the History of Present Illness, Review of Systems and Physical Exam were personally obtained by me. Portions of this information were initially documented by the CMA and reviewed by me for thoroughness and accuracy.     Mila Merry, MD  [facility] [phone] (phone) [phone] (fax)  [facility]

## 2022-11-10 NOTE — Patient Instructions (Addendum)
Please review the attached list of medications and notify my office if there are any errors.   Please call [facility] at  [phone] for follow up of chronic lung disease. You last saw Dr. Cordie Grice and Dr. Maryclare Labrador in 2022

## 2022-11-28 ENCOUNTER — Encounter: Payer: Self-pay | Admitting: Family Medicine

## 2022-11-28 DIAGNOSIS — E1142 Type 2 diabetes mellitus with diabetic polyneuropathy: Secondary | ICD-10-CM

## 2022-11-29 MED ORDER — DAPAGLIFLOZIN PROPANEDIOL 10 MG PO TABS
10.0000 mg | ORAL_TABLET | Freq: Every day | ORAL | 3 refills | Status: DC
Start: 2022-11-29 — End: 2023-12-01

## 2022-12-02 ENCOUNTER — Other Ambulatory Visit: Payer: Self-pay | Admitting: Cardiovascular Disease

## 2022-12-09 ENCOUNTER — Encounter: Payer: Self-pay | Admitting: Family Medicine

## 2022-12-09 ENCOUNTER — Other Ambulatory Visit: Payer: Self-pay

## 2022-12-09 MED ORDER — BISOPROLOL FUMARATE 5 MG PO TABS: 2.5000 mg | ORAL_TABLET | Freq: Every day | ORAL | 0 refills | Status: DC

## 2022-12-09 NOTE — Telephone Encounter (Signed)
Patient requesting refill on the following medication Bisoprolol. Last was filled by cardiology.

## 2022-12-09 NOTE — Telephone Encounter (Signed)
Refill request was send to provider for approval.

## 2022-12-27 ENCOUNTER — Ambulatory Visit: Payer: 59 | Admitting: Podiatry

## 2023-01-05 ENCOUNTER — Encounter: Payer: Self-pay | Admitting: Nurse Practitioner

## 2023-01-05 ENCOUNTER — Ambulatory Visit: Payer: 59 | Admitting: Nurse Practitioner

## 2023-01-05 NOTE — Progress Notes (Deleted)
Office Visit    Patient Name: Scott [name] Date of Encounter: 01/05/2023  Primary Care Provider:  Malva Limes, MD Primary Cardiologist:  Julien Nordmann, MD  Chief Complaint    52 y.o. male w/ a h/o Klippel-Feil syndrome, chronic HFpEF, HTN, HL, asthma, restrictive lung dzs, airway hyperactivity, depression, and anxiety, who presents for follow-up r/t ***.  Past Medical History    Past Medical History:  Diagnosis Date   Acid reflux    Acute renal failure superimposed on stage 2 chronic kidney disease (HCC) 12/30/2019   Anxiety    Arthritis    Asthma    Chronic heart failure with preserved ejection fraction (HFpEF) (HCC)    a. 02/2020 Echo: EF 60-65%, no rwma, nl RV fxn.   Contact dermatitis due to Genus Toxicodendron 09/11/2014   Depression    Dysfunction of eustachian tube 09/11/2014   Enteritis presumed infectious 06/11/2008   Fall from slipping on ice 05/19/2020   Glaucoma    Groin strain 09/11/2014   History of stress test    a. 02/2018 MV: No ischemia/infarct. EF 35% (GI uptake - >55% by echo).  Low risk study.   Hyperlipidemia    Hypertension    Klippel-Feil syndrome    Past Surgical History:  Procedure Laterality Date   COLOSTOMY     DENVER SHUNT PLACEMENT     Allergies  Allergies  Allergen Reactions   Codeine Shortness Of Breath    History of Present Illness      52 y.o. y/o male w/ a h/o Klippel-Feil syndrome, chronic HFpEF, HTN, HL, asthma, restrictive lung dzs, airway hyperactivity, depression, and anxiety.  Prior stress testing in 2019 was low risk without ischemia or infarct.  Most recent echo November 2021 showed an EF of 60 to 65% without regional wall motion abnormalities.   Scott [name] last seen cardiology clinic in July 2023, at which time he was doing well.  He was losing weight on semaglutide and in that setting, blood pressures were lower.  Home Medications    Current Outpatient Medications  Medication Sig Dispense Refill    albuterol (VENTOLIN HFA) 108 (90 Base) MCG/ACT inhaler Inhale 2 puffs into the lungs every 4 (four) hours as needed for wheezing or shortness of breath. 1 each 1   ANDROGEL PUMP 20.25 MG/ACT (1.62%) GEL Apply 1 application topically daily.      bisoprolol (ZEBETA) 5 MG tablet Take 0.5 tablets (2.5 mg total) by mouth daily. 45 tablet 0   blood glucose meter kit and supplies Use 1-2 times a day as needed.Dispense based on patient and insurance preference. (FOR ICD-10 E10.9, E11.9). 1 each 0   budesonide-formoterol (SYMBICORT) 80-4.5 MCG/ACT inhaler Inhale 2 puffs into the lungs 2 (two) times daily. (Patient not taking: Reported on 11/10/2022) 10.2 g 12   cabergoline (DOSTINEX) 0.5 MG tablet Take by mouth.     dapagliflozin propanediol (FARXIGA) 10 MG TABS tablet Take 1 tablet (10 mg total) by mouth daily. 90 tablet 3   gabapentin (NEURONTIN) 300 MG capsule Take 1 capsule (300 mg total) by mouth 2 (two) times daily. (Patient not taking: Reported on 11/10/2022) 60 capsule 3   glucose blood (ONETOUCH VERIO) test strip USE TO CHECK BLOOD SUGAR EVERY DAY 100 strip 0   hydrochlorothiazide (HYDRODIURIL) 12.5 MG tablet Take 12.5 mg by mouth every morning. (Patient not taking: Reported on 11/10/2022)     Lancets (ONETOUCH DELICA PLUS LANCET30G) MISC 1 each by Does not apply route daily. 100  each 0   Lancets (ONETOUCH ULTRASOFT) lancets Use with testing once daily and as neededUse as instructed 100 each 0   latanoprost (XALATAN) 0.005 % ophthalmic solution Place 1 drop into both eyes at bedtime.     mupirocin cream (BACTROBAN) 2 % Apply 1 Application topically 2 (two) times daily. 30 g 1   mupirocin ointment (BACTROBAN) 2 % Apply to rash on chin twice a day until clear (Patient not taking: Reported on 11/10/2022) 22 g 2   ofloxacin (OCUFLOX) 0.3 % ophthalmic solution Place 1 drop into the left eye. (Patient not taking: Reported on 11/10/2022)     PARoxetine (PAXIL) 40 MG tablet Take 1 tablet (40 mg total) by mouth daily.  90 tablet 1   potassium chloride SA (KLOR-CON M) 20 MEQ tablet TAKE 2 TABLETS(40 MEQ) BY MOUTH TWICE DAILY 120 tablet 1   spironolactone (ALDACTONE) 50 MG tablet Take 1 tablet (50 mg total) by mouth 2 (two) times daily. 90 tablet 1   torsemide (DEMADEX) 20 MG tablet TAKE 3 TABLETS(60 MG) BY MOUTH DAILY 270 tablet 0   triamcinolone ointment (KENALOG) 0.5 % Apply to rash in ears twice a day until clear 45 g 1   TRULICITY 1.5 MG/0.5ML SOPN Inject 1.5 mg into the skin once a week. 2 mL 3   No current facility-administered medications for this visit.     Review of Systems    ***.  All other systems reviewed and are otherwise negative except as noted above.    Physical Exam    VS:  There were no vitals taken for this visit. , BMI There is no height or weight on file to calculate BMI.     GEN: Well nourished, well developed, in no acute distress. HEENT: normal. Neck: Supple, no JVD, carotid bruits, or masses. Cardiac: RRR, no murmurs, rubs, or gallops. No clubbing, cyanosis, edema.  Radials 2+/PT 2+ and equal bilaterally.  Respiratory:  Respirations regular and unlabored, clear to auscultation bilaterally. GI: Soft, nontender, nondistended, BS + x 4. MS: no deformity or atrophy. Skin: warm and dry, no rash. Neuro:  Strength and sensation are intact. Psych: Normal affect.  Accessory Clinical Findings    ECG personally reviewed by me today -    *** - no acute changes.  Lab Results  Component Value Date   WBC 10.6 09/02/2022   HGB 14.5 09/02/2022   HCT 44.7 09/02/2022   MCV 91 09/02/2022   PLT 350 09/02/2022   Lab Results  Component Value Date   CREATININE 1.17 09/02/2022   BUN 18 09/02/2022   NA 138 09/02/2022   K 4.1 09/02/2022   CL 91 (L) 09/02/2022   CO2 34 (H) 09/02/2022   Lab Results  Component Value Date   ALT 11 09/02/2022   AST 14 09/02/2022   ALKPHOS 140 (H) 09/02/2022   BILITOT 0.3 09/02/2022   Lab Results  Component Value Date   CHOL 174 09/02/2022    HDL 58 09/02/2022   LDLCALC 86 09/02/2022   TRIG 180 (H) 09/02/2022   CHOLHDL 3.0 09/02/2022    Lab Results  Component Value Date   HGBA1C 7.5 (H) 09/02/2022    Assessment & Plan    1.  ***  Nicolasa Ducking, NP 01/05/2023, 1:22 PM

## 2023-01-14 ENCOUNTER — Ambulatory Visit (INDEPENDENT_AMBULATORY_CARE_PROVIDER_SITE_OTHER): Payer: 59 | Admitting: Podiatry

## 2023-01-14 DIAGNOSIS — Z91199 Patient's noncompliance with other medical treatment and regimen due to unspecified reason: Secondary | ICD-10-CM

## 2023-01-15 NOTE — Progress Notes (Signed)
1. No-show for appointment     

## 2023-01-19 ENCOUNTER — Other Ambulatory Visit: Payer: Self-pay | Admitting: Cardiovascular Disease

## 2023-01-21 ENCOUNTER — Encounter: Payer: Self-pay | Admitting: Family Medicine

## 2023-01-21 ENCOUNTER — Other Ambulatory Visit: Payer: Self-pay

## 2023-01-21 DIAGNOSIS — J4550 Severe persistent asthma, uncomplicated: Secondary | ICD-10-CM

## 2023-01-21 DIAGNOSIS — J9611 Chronic respiratory failure with hypoxia: Secondary | ICD-10-CM

## 2023-01-21 MED ORDER — POTASSIUM CHLORIDE CRYS ER 20 MEQ PO TBCR: 40.0000 meq | EXTENDED_RELEASE_TABLET | Freq: Two times a day (BID) | ORAL | 0 refills | Status: DC

## 2023-01-28 NOTE — Telephone Encounter (Signed)
Referral re faxed to [facility] pulmonology office

## 2023-02-09 NOTE — Progress Notes (Unsigned)
Cardiology Office Note:    Date:  02/10/2023  ID:  [name], DOB [birth year], MRN [id number] PCP: Malva Limes, MD  [facility] Providers Cardiologist:  Julien Nordmann, MD       Patient Profile:      Klippel-File syndrome T2DM COPD HFpEF Kyphoscoliosis Chronic respiratory failure with hypoxia Severe restrictive lung disease Hypertension Hyperlipidemia Asthma Obesity      History of Present Illness:   Scott [name] is a 52 y.o. male who returns for HFpEF 1 year follow-up.  He was seen in the ED on 02/2018 for dyspnea and subsequently diagnosed with HFpEF, echo showed EF 55-60%.  Lexiscan MPI on 02/2018 showed no evidence of ischemia.  Sleep study 03/2018 showed no evidence of sleep apnea.  Pulmonary function testing has shown severe restrictive lung disease.  He has been seen in the office several times for ongoing dyspnea and weight gain, he had been advised to reestablish with pulmonology.  It was thought that his dyspnea is multifactorial including severe restrictive lung disease, COPD, kyphoscoliosis, obesity, diastolic dysfunction.  He was admitted on 12/2019 for COPD exacerbation.  Echo 02/23/2020 showed EF 60-65%, no RWMA, normal LV diastolic function parameters, normal RV SF, no significant valvular abnormalities.  He was seen in clinic on 02/03/2021 at that time he noted he had reestablished with pulmonology at [facility].  He was last seen in office on 10/21/2021 by Dr. Mariah Milling.  Patient noted weight loss on Ozempic.  He was euvolemic at the time and his furosemide and spironolactone were continued.  Today:  Patient is doing well from a cardiology perspective.  He notes that he has some flutters in his chest when he is not wearing his oxygen.  He notes ongoing SOB/DOE from chronic respiratory failure.  He notes that his portable oxygen tank runs low when he leaves his home and sometimes he has to turn it off to conserve the amount of oxygen.  When his vitals were being taken  in the room he took his oxygen off and his SpO2 dropped to 84%, we resumed his home oxygen and SpO2 recovered to 93%.  He notes he has been discharged as a patient from [facility] and [facility] pulmonology.  He currently does not have a pulmonologist at this time.  His weight has been stable at home, denies any bilateral leg swelling or abdominal distention.  He notes recently diagnosed with cellulitis in the right leg that has been healing appropriately.  He denies chest pain, palpitations, tachycardia, syncope, near syncope.           Review of Systems  Constitutional: Negative for weight gain and weight loss.  Cardiovascular:  Positive for dyspnea on exertion. Negative for chest pain, claudication, cyanosis, irregular heartbeat, leg swelling, near-syncope, orthopnea, palpitations, paroxysmal nocturnal dyspnea and syncope.  Respiratory:  Positive for shortness of breath. Negative for cough and hemoptysis.   Gastrointestinal:  Negative for abdominal pain, hematochezia and melena.  Genitourinary:  Negative for hematuria.     See HPI     Studies Reviewed:   EKG Interpretation Date/Time:  Thursday February 10 2023 14:14:54 EST Ventricular Rate:  102 PR Interval:  144 QRS Duration:  82 QT Interval:  332 QTC Calculation: 432 R Axis:   -27  Text Interpretation: Sinus tachycardia Confirmed by Rise Paganini [phone]) on 02/10/2023 3:29:54 PM     Echo 02/20/2020  1. Left ventricular ejection fraction, by estimation, is 60 to 65%. The  left ventricle has normal function.  The left ventricle has no regional  wall motion abnormalities. Left ventricular diastolic parameters were  normal.   2. Right ventricular systolic function is normal. The right ventricular  size is normal.   3. The mitral valve is normal in structure. No evidence of mitral valve  regurgitation. No evidence of mitral stenosis.   4. The aortic valve is normal in structure. Aortic valve regurgitation is  not  visualized. No aortic stenosis is present.   5. The inferior vena cava is dilated in size with >50% respiratory  variability, suggesting right atrial pressure of 8 mmHg.  Risk Assessment/Calculations:             Physical Exam:   VS:  BP 114/72 (BP Location: Left Arm, Patient Position: Sitting, Cuff Size: Normal)   Pulse 89   Temp 98.7 F (37.1 C)   Resp 20   Ht 5\' 2"  (1.575 m)   Wt 199 lb 12.8 oz (90.6 kg)   SpO2 93%   BMI 36.54 kg/m    Wt Readings from Last 3 Encounters:  02/10/23 199 lb 12.8 oz (90.6 kg)  11/10/22 196 lb (88.9 kg)  09/21/22 198 lb (89.8 kg)    Constitutional:      Appearance: Normal and healthy appearance.  Neck:     Vascular: JVD normal.  Pulmonary:     Effort: Pulmonary effort is normal.     Breath sounds: Normal breath sounds.  Chest:     Chest wall: Not tender to palpatation.  Cardiovascular:     PMI at left midclavicular line. Normal rate. Regular rhythm. Normal S1. Normal S2.      Murmurs: There is no murmur.     No gallop.  No click. No rub.  Pulses:    Intact distal pulses.  Edema:    Peripheral edema absent.  Musculoskeletal: Normal range of motion.     Cervical back: Normal range of motion and neck supple. Skin:    General: Skin is warm and dry.  Neurological:     General: No focal deficit present.     Mental Status: Alert and oriented to person, place and time.  Psychiatric:        Behavior: Behavior is cooperative.        Assessment and Plan:  HFpEF -Echo 02/20/2020 LVEF 60 to 65%, no RWMA, LV diastolic parameters normal -Euvolemic and well compensated on exam -Continue Farxiga 10 mg, spironolactone 50 mg, Demadex 20 mg -Encouraged daily weights, heart healthy diet  Hypertension  -BP today 114/72 -Controlled, continue bisoprolol 2.5 mg, hydrochlorothiazide 12.5 mg -Continue to log blood pressures at home  Hyperlipidemia  -LDL 86 on 09/02/2022 -Continue lifestyle modifications, weight loss  Chronic hypoxic respiratory  failure / restrictive lung disease / COPD -Referral to  and [facility] pulmonology denied as he was discharged from practices in past, he does not want to see [facility] pulmonology (last seen in 2022) -We spoke regarding the importance of having pulmonology follow his chronic disease -SpO2 dropped to 84% on room air, now on O2 continuously to maintain SpO2 greater than 90% -Referred to external pulmonology close to his home  Type 2 diabetes mellitus -A1c 7.5 on 09/02/2022 -He noted stopping his Trulicity several months ago because he felt like it was not helping his blood sugars -Encouraged to follow-up with PCP  Fluttering in chest -EKG today sinus tach bpm 102 -Occurs when he takes his oxygen off, likely related to hypoxia -Encouraged continued use use of his oxygen  Dispo:  Return in about 1 year (around 02/10/2024).  Signed, Denyce Robert, NP

## 2023-02-10 ENCOUNTER — Ambulatory Visit: Payer: 59 | Attending: Nurse Practitioner | Admitting: Emergency Medicine

## 2023-02-10 ENCOUNTER — Encounter: Payer: Self-pay | Admitting: Nurse Practitioner

## 2023-02-10 VITALS — BP 114/72 | HR 89 | Temp 98.7°F | Resp 20 | Ht 62.0 in | Wt 199.8 lb

## 2023-02-10 DIAGNOSIS — E782 Mixed hyperlipidemia: Secondary | ICD-10-CM | POA: Diagnosis not present

## 2023-02-10 DIAGNOSIS — I5032 Chronic diastolic (congestive) heart failure: Secondary | ICD-10-CM

## 2023-02-10 DIAGNOSIS — J449 Chronic obstructive pulmonary disease, unspecified: Secondary | ICD-10-CM | POA: Diagnosis not present

## 2023-02-10 DIAGNOSIS — J984 Other disorders of lung: Secondary | ICD-10-CM

## 2023-02-10 DIAGNOSIS — I10 Essential (primary) hypertension: Secondary | ICD-10-CM | POA: Diagnosis not present

## 2023-02-10 DIAGNOSIS — E119 Type 2 diabetes mellitus without complications: Secondary | ICD-10-CM

## 2023-02-10 NOTE — Patient Instructions (Signed)
Medication Instructions:  Your physician recommends that you continue on your current medications as directed. Please refer to the Current Medication list given to you today.  *If you need a refill on your cardiac medications before your next appointment, please call your pharmacy*  Lab Work: - None ordered  Testing/Procedures: - None ordered  Follow-Up: At [facility], you and your health needs are our priority.  As part of our continuing mission to provide you with exceptional heart care, we have created designated Provider Care Teams.  These Care Teams include your primary Cardiologist (physician) and Advanced Practice Providers (APPs -  Physician Assistants and Nurse Practitioners) who all work together to provide you with the care you need, when you need it.  Your next appointment:   1 year(s)  Provider:   You may see Julien Nordmann, MD or one of the following Advanced Practice Providers on your designated Care Team:   Nicolasa Ducking, NP Eula Listen, PA-C Cadence Fransico Michael, PA-C Charlsie Quest, NP Carlos Levering, NP    Other Instructions - Ambulatory referral to Pulmonology [facility]

## 2023-02-18 ENCOUNTER — Other Ambulatory Visit: Payer: Self-pay | Admitting: Cardiovascular Disease

## 2023-03-22 ENCOUNTER — Other Ambulatory Visit: Payer: Self-pay

## 2023-03-22 MED ORDER — BISOPROLOL FUMARATE 5 MG PO TABS: 2.5000 mg | ORAL_TABLET | Freq: Every day | ORAL | 0 refills | Status: DC

## 2023-03-28 ENCOUNTER — Other Ambulatory Visit: Payer: Self-pay | Admitting: Family Medicine

## 2023-03-28 NOTE — Telephone Encounter (Signed)
Medication Refill -  Most Recent Primary Care Visit:  Provider: Malva Limes  Department: [facility]  Visit Type: OFFICE VISIT  Date: 11/10/2022  Medication: spironolactone (ALDACTONE) 50 MG tablet Has the patient contacted their pharmacy? yes (Agent: If yes, when and what did the pharmacy advise?)contact pcp  Is this the correct pharmacy for this prescription? yes  This is the patient's preferred pharmacy:  [id number] - [place], [place] - [address] AT [facility] OF [address] [facility] [address] [place] [address] Phone: [phone] Fax: [phone]    Has the prescription been filled recently? no  Is the patient out of the medication? yes  Has the patient been seen for an appointment in the last year OR does the patient have an upcoming appointment? yes  Can we respond through MyChart? yes  Agent: Please be advised that Rx refills may take up to 3 business days. We ask that you follow-up with your pharmacy.

## 2023-04-01 ENCOUNTER — Other Ambulatory Visit: Payer: Self-pay | Admitting: Family Medicine

## 2023-04-01 ENCOUNTER — Other Ambulatory Visit: Payer: Self-pay | Admitting: Cardiovascular Disease

## 2023-04-04 ENCOUNTER — Telehealth: Payer: Self-pay

## 2023-04-04 NOTE — Telephone Encounter (Signed)
This medication is managed by Dr. Odis Luster and he sent in a refill today. Grover Canavan notified and verbalized understanding. She will contact pharmacy.

## 2023-04-04 NOTE — Telephone Encounter (Signed)
Copied from CRM [id number]. Topic: General - Other >> Apr 01, 2023  4:04 PM Everette C wrote: Reason for CRM: The patient's significant other has made contact regarding a previously submitted refill request of spironolactone (ALDACTONE) 50 MG tablet [[id number]] The urgency of the request has been stressed with the upcoming weekend  Please contact the patient further when possible

## 2023-04-13 ENCOUNTER — Other Ambulatory Visit: Payer: Self-pay

## 2023-04-13 ENCOUNTER — Telehealth: Payer: Self-pay | Admitting: Cardiovascular Disease

## 2023-04-13 MED ORDER — SPIRONOLACTONE 50 MG PO TABS: 50.0000 mg | ORAL_TABLET | Freq: Every day | ORAL | 3 refills | Status: DC

## 2023-04-13 NOTE — Telephone Encounter (Signed)
*  STAT* If patient is at the pharmacy, call can be transferred to refill team.   1. Which medications need to be refilled? (please list name of each medication and dose if known) spironolactone  (ALDACTONE ) 50 MG tablet   2. Which pharmacy/location (including street and city if local pharmacy) is medication to be sent to?  [id number] - [place], [place] - [address] AT [facility] OF [address]      3. Do they need a 30 day or 90 day supply? 90 day    Pt has been without medication since 03/25/2023. A refill request was sent to above pharmacy but pt called stating they've told him they didn't receive anything although we have a receipt from pharmacy. I reached out to the pharmacy and they stated the last thing they received was on 04/01/23 and the pt had no more refills and the prescription had been closed due to a transfer. They couldn't explain what happened but advised we send over a new prescription. Pt is in need of another prescription refill as soon as possible with the hopes that this pharmacy will receive it and fill it.

## 2023-04-13 NOTE — Telephone Encounter (Signed)
 Spoke to patient and informed him that the medication has been filled and the pharmacy said it would be ready to be picked un by 5:45 PM. Patient understood with read back

## 2023-04-25 ENCOUNTER — Telehealth: Payer: Self-pay | Admitting: Family Medicine

## 2023-04-25 NOTE — Telephone Encounter (Signed)
Patient has been out of medication and needs followup appoitment please schedule

## 2023-04-25 NOTE — Telephone Encounter (Signed)
[facility] pharmacy is requesting prescription refill PARoxetine (PAXIL) 40 MG tablet  Please advise

## 2023-04-25 NOTE — Telephone Encounter (Signed)
Called patient and was unable to leave vm due to vm being full.  Okay for PEC to schedule if pt returns call.

## 2023-05-02 ENCOUNTER — Ambulatory Visit (INDEPENDENT_AMBULATORY_CARE_PROVIDER_SITE_OTHER): Payer: 59 | Admitting: Family Medicine

## 2023-05-02 VITALS — BP 128/73 | HR 96 | Resp 18 | Wt 183.4 lb

## 2023-05-02 DIAGNOSIS — J441 Chronic obstructive pulmonary disease with (acute) exacerbation: Secondary | ICD-10-CM

## 2023-05-02 DIAGNOSIS — M79604 Pain in right leg: Secondary | ICD-10-CM

## 2023-05-02 DIAGNOSIS — L03119 Cellulitis of unspecified part of limb: Secondary | ICD-10-CM

## 2023-05-02 DIAGNOSIS — M79605 Pain in left leg: Secondary | ICD-10-CM

## 2023-05-02 MED ORDER — ALBUTEROL SULFATE HFA 108 (90 BASE) MCG/ACT IN AERS: 2.0000 | INHALATION_SPRAY | RESPIRATORY_TRACT | 1 refills | Status: AC | PRN

## 2023-05-02 MED ORDER — CEPHALEXIN 500 MG PO CAPS: 500.0000 mg | ORAL_CAPSULE | Freq: Four times a day (QID) | ORAL | 0 refills | Status: AC

## 2023-05-03 ENCOUNTER — Ambulatory Visit
Admission: RE | Admit: 2023-05-03 | Discharge: 2023-05-03 | Disposition: A | Discharge status: Home / Self Care | Payer: 59 | Source: Ambulatory Visit | Attending: Family Medicine | Admitting: Family Medicine

## 2023-05-03 DIAGNOSIS — M79604 Pain in right leg: Secondary | ICD-10-CM | POA: Diagnosis present

## 2023-05-03 DIAGNOSIS — M79605 Pain in left leg: Secondary | ICD-10-CM | POA: Insufficient documentation

## 2023-05-04 ENCOUNTER — Encounter: Payer: Self-pay | Admitting: Family Medicine

## 2023-05-04 NOTE — Progress Notes (Signed)
Established patient visit   Patient: Scott [name]   DOB: April 28, 1970   53 y.o. Male  MRN: [id number] Visit Date: 05/02/2023  Today's healthcare provider: Mila Merry, MD   Chief Complaint  Patient presents with   Foot Pain    Right side, started about a week ago, with some swelling and redness - no falls or injuries that patient recalls   Ankle Pain   Subjective    Discussed the use of AI scribe software for clinical note transcription with the patient, who gave verbal consent to proceed.  History of Present Illness   The patient presents with concerns about spots on his legs, particularly the right leg, which he believes may be a recurrence of a previous infection. He reports that the condition of the right leg is worse than the left, with visible spots and swelling. The patient also notes numbness in his toes, which is not visible but perceptible to him. He reports that his feet are cold, a condition he attributes to living in a cold house due to lack of heat. He also mentions experiencing pain in his feet, particularly after exposure to cold conditions, which has occasionally been severe enough to affect his ability to walk.  The patient also reports pain in the back of his calves, which has been ongoing for a while. He has been walking more lately, which he notes has improved his breathing. However, he expresses concern about the appearance of his toes and the potential for circulation problems. He has been managing his mobility in public spaces, such as grocery stores, with the use of a motorized cart, but notes recent improvements in his ability to walk without it.       Medications: Outpatient Medications Prior to Visit  Medication Sig   ANDROGEL PUMP 20.25 MG/ACT (1.62%) GEL Apply 1 application topically daily.    bisoprolol (ZEBETA) 5 MG tablet Take 0.5 tablets (2.5 mg total) by mouth daily.   blood glucose meter kit and supplies Use 1-2 times a day as needed.Dispense  based on patient and insurance preference. (FOR ICD-10 E10.9, E11.9).   budesonide-formoterol (SYMBICORT) 80-4.5 MCG/ACT inhaler Inhale 2 puffs into the lungs 2 (two) times daily.   cabergoline (DOSTINEX) 0.5 MG tablet Take by mouth.   dapagliflozin propanediol (FARXIGA) 10 MG TABS tablet Take 1 tablet (10 mg total) by mouth daily.   gabapentin (NEURONTIN) 300 MG capsule Take 1 capsule (300 mg total) by mouth 2 (two) times daily.   glucose blood (ONETOUCH VERIO) test strip USE TO CHECK BLOOD SUGAR EVERY DAY   hydrochlorothiazide (HYDRODIURIL) 12.5 MG tablet Take 12.5 mg by mouth every morning.   Lancets (ONETOUCH DELICA PLUS LANCET30G) MISC 1 each by Does not apply route daily.   Lancets (ONETOUCH ULTRASOFT) lancets Use with testing once daily and as neededUse as instructed   latanoprost (XALATAN) 0.005 % ophthalmic solution Place 1 drop into both eyes at bedtime.   mupirocin cream (BACTROBAN) 2 % Apply 1 Application topically 2 (two) times daily.   mupirocin ointment (BACTROBAN) 2 % Apply to rash on chin twice a day until clear   ofloxacin (OCUFLOX) 0.3 % ophthalmic solution Place 1 drop into the left eye.   PARoxetine (PAXIL) 40 MG tablet Take 1 tablet (40 mg total) by mouth daily.   potassium chloride SA (KLOR-CON M) 20 MEQ tablet TAKE 2 TABLETS(40 MEQ) BY MOUTH TWICE DAILY   spironolactone (ALDACTONE) 50 MG tablet Take 1 tablet (50 mg total)  by mouth daily.   torsemide (DEMADEX) 20 MG tablet TAKE 3 TABLETS(60 MG) BY MOUTH DAILY   triamcinolone ointment (KENALOG) 0.5 % Apply to rash in ears twice a day until clear   TRULICITY 1.5 MG/0.5ML SOPN Inject 1.5 mg into the skin once a week.   [DISCONTINUED] albuterol (VENTOLIN HFA) 108 (90 Base) MCG/ACT inhaler Inhale 2 puffs into the lungs every 4 (four) hours as needed for wheezing or shortness of breath.   No facility-administered medications prior to visit.        Objective    BP 128/73   Pulse 96   Resp 18   Wt 183 lb 6.4 oz (83.2 kg)    SpO2 96%   BMI 33.54 kg/m    Physical Exam   CARDIOVASCULAR: Pulses in feet weak. SKIN: Right foot with swelling, presence of spots. Toes on right foot numb.    No results found for any visits on 05/02/23.  Assessment & Plan        Cold feet, numb toes, and weak pulses. Pain in calves with walking. Recent exposure to cold environment. Possible infection on right leg. -Order ABI to assess circulation. -Prescribe cephalexin for possible infection on right leg. -Advise against additional exercise until circulation is assessed.  Asthma Improvement in breathing with increased walking. -Refill Albuterol and Taxol prescriptions.  Follow-up Await results of arterial studies.         Mila Merry, MD  [facility] [phone] (phone) [phone] (fax)  [facility]

## 2023-05-09 ENCOUNTER — Other Ambulatory Visit: Payer: Self-pay | Admitting: Cardiovascular Disease

## 2023-05-10 ENCOUNTER — Ambulatory Visit (INDEPENDENT_AMBULATORY_CARE_PROVIDER_SITE_OTHER): Payer: 59 | Admitting: Internal Medicine

## 2023-05-10 ENCOUNTER — Encounter: Payer: Self-pay | Admitting: Internal Medicine

## 2023-05-10 VITALS — BP 116/70 | HR 85 | Temp 98.2°F | Resp 16 | Ht 62.0 in | Wt 184.0 lb

## 2023-05-10 DIAGNOSIS — M419 Scoliosis, unspecified: Secondary | ICD-10-CM | POA: Diagnosis not present

## 2023-05-10 DIAGNOSIS — G4719 Other hypersomnia: Secondary | ICD-10-CM | POA: Diagnosis not present

## 2023-05-10 DIAGNOSIS — R0602 Shortness of breath: Secondary | ICD-10-CM

## 2023-05-10 NOTE — Patient Instructions (Signed)

## 2023-05-10 NOTE — Progress Notes (Signed)
 [facility] [address] [place], [place] [address]  Pulmonary Sleep Medicine   Office Visit Note  Patient Name: Scott [name] DOB: [birth year] MRN [id number]  Date of Service: 05/10/2023  Complaints/HPI: He states he wants to establish pulmonary doctor. He states he has shortness of breath. Also has a history of kyphoscoliosis. Patient states he is on albuterol  only. States he has been a former smoker. States he quit in 2008. He states he smoked1/2PPD denies having cough or congestion. He states he cannot walk too far due to shortness of breath. He states he also has arhtritis and this limits him . He sleeps with oxygen  at night. He states he has had a sleep test done in the past. He states he does not have a CPAP currently. He does sleep in late he states until 10-11am and states he sleep at 11pm. He has noted no major headaches. He does dose off if watching TV or infront of a computer. He has fallen asleep in the car   STOP BANG RISK ASSESSMENT S (snore) Have you been told that you snore?     YES   T (tired) Are you often tired, fatigued, or sleepy during the day?   YES  O (obstruction) Do you stop breathing, choke, or gasp during sleep? YES   P (pressure) Do you have or are you being treated for high blood pressure? YES   B (BMI) Is your body index greater than 35 kg/m? NO   A (age) Are you 53 years old or older? YES   N (neck) Do you have a neck circumference greater than 16 inches?   No   G (gender) Are you a male? YES   TOTAL STOP/BANG "YES" ANSWERS                                                                        ROS  General: (-) fever, (-) chills, (-) night sweats, (-) weakness Skin: (-) rashes, (-) itching,. Eyes: (-) visual changes, (-) redness, (-) itching. Nose and Sinuses: (-) nasal stuffiness or itchiness, (-) postnasal drip, (-) nosebleeds, (-) sinus trouble. Mouth and Throat: (-) sore throat, (-) hoarseness. Neck: (-) swollen glands, (-)  enlarged thyroid , (-) neck pain. Respiratory: - cough, (-) bloody sputum, + shortness of breath, - wheezing. Cardiovascular: - ankle swelling, (-) chest pain. Lymphatic: (-) lymph node enlargement. Neurologic: (-) numbness, (-) tingling. Psychiatric: (-) anxiety, (-) depression   Current Medication: Outpatient Encounter Medications as of 05/10/2023  Medication Sig   albuterol  (VENTOLIN  HFA) 108 (90 Base) MCG/ACT inhaler Inhale 2 puffs into the lungs every 4 (four) hours as needed for wheezing or shortness of breath.   ANDROGEL  PUMP 20.25 MG/ACT (1.62%) GEL Apply 1 application topically daily.    bisoprolol  (ZEBETA ) 5 MG tablet TAKE 1/2 TABLET(2.5 MG) BY MOUTH DAILY   blood glucose meter kit and supplies Use 1-2 times a day as needed.Dispense based on patient and insurance preference. (FOR ICD-10 E10.9, E11.9).   budesonide -formoterol  (SYMBICORT ) 80-4.5 MCG/ACT inhaler Inhale 2 puffs into the lungs 2 (two) times daily.   cabergoline (DOSTINEX) 0.5 MG tablet Take by mouth.   cephALEXin  (KEFLEX ) 500 MG capsule Take 1 capsule (500 mg total) by mouth  4 (four) times daily for 10 days.   dapagliflozin  propanediol (FARXIGA ) 10 MG TABS tablet Take 1 tablet (10 mg total) by mouth daily.   gabapentin  (NEURONTIN ) 300 MG capsule Take 1 capsule (300 mg total) by mouth 2 (two) times daily.   glucose blood (ONETOUCH VERIO) test strip USE TO CHECK BLOOD SUGAR EVERY DAY   hydrochlorothiazide  (HYDRODIURIL ) 12.5 MG tablet Take 12.5 mg by mouth every morning.   Lancets (ONETOUCH DELICA PLUS LANCET30G) MISC 1 each by Does not apply route daily.   Lancets (ONETOUCH ULTRASOFT) lancets Use with testing once daily and as neededUse as instructed   latanoprost  (XALATAN ) 0.005 % ophthalmic solution Place 1 drop into both eyes at bedtime.   mupirocin  cream (BACTROBAN ) 2 % Apply 1 Application topically 2 (two) times daily.   mupirocin  ointment (BACTROBAN ) 2 % Apply to rash on chin twice a day until clear   ofloxacin  (OCUFLOX) 0.3 % ophthalmic solution Place 1 drop into the left eye.   PARoxetine  (PAXIL ) 40 MG tablet Take 1 tablet (40 mg total) by mouth daily.   potassium chloride  SA (KLOR-CON  M) 20 MEQ tablet TAKE 2 TABLETS(40 MEQ) BY MOUTH TWICE DAILY   spironolactone  (ALDACTONE ) 50 MG tablet Take 1 tablet (50 mg total) by mouth daily.   torsemide  (DEMADEX ) 20 MG tablet TAKE 3 TABLETS(60 MG) BY MOUTH DAILY   triamcinolone  ointment (KENALOG ) 0.5 % Apply to rash in ears twice a day until clear   TRULICITY  1.5 MG/0.5ML SOPN Inject 1.5 mg into the skin once a week.   No facility-administered encounter medications on file as of 05/10/2023.    Surgical History: Past Surgical History:  Procedure Laterality Date   COLOSTOMY     DENVER SHUNT PLACEMENT      Medical History: Past Medical History:  Diagnosis Date   Acid reflux    Acute renal failure superimposed on stage 2 chronic kidney disease (HCC) 12/30/2019   Anxiety    Arthritis    Asthma    Chronic heart failure with preserved ejection fraction (HFpEF) (HCC)    a. 02/2020 Echo: EF 60-65%, no rwma, nl RV fxn.   Contact dermatitis due to Genus Toxicodendron 09/11/2014   COPD (chronic obstructive pulmonary disease) (HCC)    Depression    Dysfunction of eustachian tube 09/11/2014   Enteritis presumed infectious 06/11/2008   Fall from slipping on ice 05/19/2020   Glaucoma    Groin strain 09/11/2014   History of stress test    a. 02/2018 MV: No ischemia/infarct. EF 35% (GI uptake - >55% by echo).  Low risk study.   Hyperlipidemia    Hypertension    Klippel-Feil syndrome     Family History: Family History  Problem Relation Age of Onset   Multiple sclerosis Mother    Colon cancer Father    Heart disease Father    Diabetes Sister    Lung cancer Maternal Grandmother    Thyroid  cancer Maternal Grandfather    Ovarian cancer Paternal Grandmother    Cancer Paternal Grandfather     Social History: Social History   Socioeconomic History    Marital status: Significant Other    Spouse name: Not on file   Number of children: 0   Years of education: Not on file   Highest education level: Bachelor's degree (e.g., BA, AB, BS)  Occupational History   Occupation: disability  Tobacco Use   Smoking status: Former    Current packs/day: 0.00    Average packs/day: 1 pack/day for  10.0 years (10.0 ttl pk-yrs)    Types: Cigarettes    Start date: 05/1996    Quit date: 05/2006    Years since quitting: 17.0   Smokeless tobacco: Never   Tobacco comments:    quit 05/2006  Vaping Use   Vaping status: Former   Devices: tried once  Substance and Sexual Activity   Alcohol  use: No    Alcohol /week: 0.0 standard drinks of alcohol    Drug use: No   Sexual activity: Not on file  Other Topics Concern   Not on file  Social History Narrative   Not on file   Social Drivers of Health   Financial Resource Strain: High Risk (05/02/2023)   Overall Financial Resource Strain (CARDIA)    Difficulty of Paying Living Expenses: Very hard  Food Insecurity: Food Insecurity Present (05/02/2023)   Hunger Vital Sign    Worried About Running Out of Food in the Last Year: Often true    Ran Out of Food in the Last Year: Sometimes true  Transportation Needs: No Transportation Needs (05/02/2023)   PRAPARE - Administrator, Civil Service (Medical): No    Lack of Transportation (Non-Medical): No  Physical Activity: Inactive (05/02/2023)   Exercise Vital Sign    Days of Exercise per Week: 0 days    Minutes of Exercise per Session: 0 min  Stress: Stress Concern Present (05/02/2023)   [employer] of Occupational Health - Occupational Stress Questionnaire    Feeling of Stress : Very much  Social Connections: Moderately Integrated (05/02/2023)   Social Connection and Isolation Panel [NHANES]    Frequency of Communication with Friends and Family: More than three times a week    Frequency of Social Gatherings with Friends and Family: Once a week     Attends Religious Services: Never    Database Administrator or Organizations: Yes    Attends Engineer, Structural: More than 4 times per year    Marital Status: Living with partner  Intimate Partner Violence: Not At Risk (06/28/2022)   Humiliation, Afraid, Rape, and Kick questionnaire    Fear of Current or Ex-Partner: No    Emotionally Abused: No    Physically Abused: No    Sexually Abused: No    Vital Signs: Blood pressure 116/70, pulse 85, temperature 98.2 F (36.8 C), resp. rate 16, height 5' 2 (1.575 m), weight 184 lb (83.5 kg), SpO2 93%.  Examination: General Appearance: The patient is well-developed, well-nourished, and in no distress. Skin: Gross inspection of skin unremarkable. Head: normocephalic, no gross deformities. Eyes: no gross deformities noted. ENT: ears appear grossly normal no exudates. Neck: Supple. No thyromegaly. No LAD. Respiratory: distant few rhonchi noted. Cardiovascular: Normal S1 and S2 without murmur or rub. Extremities: No cyanosis. pulses are equal. Neurologic: Alert and oriented. No involuntary movements.  LABS: No results found for this or any previous visit (from the past 2160 hours).  Radiology: US  ARTERIAL ABI (SCREENING LOWER EXTREMITY) Result Date: 05/03/2023 CLINICAL DATA:  Lower extremity pain and history of hypertension and diabetes. EXAM: NONINVASIVE PHYSIOLOGIC VASCULAR STUDY OF BILATERAL LOWER EXTREMITIES TECHNIQUE: Evaluation of both lower extremities were performed at rest, including calculation of ankle-brachial indices with single level Doppler, pressure and pulse volume recording. COMPARISON:  None Available. FINDINGS: Right ABI:  1.4 Left ABI:  1.2 Right Lower Extremity:  Normal arterial waveforms at the ankle. Left Lower Extremity:  Normal arterial waveforms at the ankle. 1.0-1.4 Normal IMPRESSION: Normal resting ankle-brachial indices and distal  waveforms bilaterally. Electronically Signed   By: Marcey Moan M.D.   On:  05/03/2023 10:47    No results found.  US  ARTERIAL ABI (SCREENING LOWER EXTREMITY) Result Date: 05/03/2023 CLINICAL DATA:  Lower extremity pain and history of hypertension and diabetes. EXAM: NONINVASIVE PHYSIOLOGIC VASCULAR STUDY OF BILATERAL LOWER EXTREMITIES TECHNIQUE: Evaluation of both lower extremities were performed at rest, including calculation of ankle-brachial indices with single level Doppler, pressure and pulse volume recording. COMPARISON:  None Available. FINDINGS: Right ABI:  1.4 Left ABI:  1.2 Right Lower Extremity:  Normal arterial waveforms at the ankle. Left Lower Extremity:  Normal arterial waveforms at the ankle. 1.0-1.4 Normal IMPRESSION: Normal resting ankle-brachial indices and distal waveforms bilaterally. Electronically Signed   By: Marcey Moan M.D.   On: 05/03/2023 10:47    Assessment and Plan: Patient Active Problem List   Diagnosis Date Noted   Chronic respiratory failure with hypoxia (HCC) 11/10/2022   Acute on chronic combined systolic and diastolic congestive heart failure (HCC) 09/02/2022   Encounter for hepatitis C screening test for low risk patient 09/02/2022   Depression with anxiety 09/02/2022   Cellulitis of right lower extremity 09/02/2022   Fungal rash of right side face.  05/19/2020   Pain due to onychomycosis of toenails of both feet 03/13/2020   Diabetic polyneuropathy associated with type 2 diabetes mellitus (HCC) 03/13/2020   History of bradycardia 02/13/2020   COPD exacerbation (HCC) 12/30/2019   Chronic diastolic heart failure (HCC) 02/16/2018   Kyphoscoliosis and scoliosis 01/30/2018   Erectile dysfunction 09/09/2016   Disorder of bursae of shoulder region 09/07/2016   Impingement syndrome of shoulder region 09/07/2016   Anxiety 09/11/2014   Airway hyperreactivity 09/11/2014   Constipation 09/11/2014   Alopecia, male pattern 09/11/2014   Decreased libido 09/11/2014   Cephalalgia 09/11/2014   Umbilical hernia 09/11/2014   Primary  hypertension 09/11/2014   Migraine 09/11/2014   Neoplasm of uncertain behavior 09/11/2014   Hernia of anterior abdominal wall 09/11/2014   Open-angle glaucoma 05/20/2009   Congenital hydrocephalus (HCC) 05/20/2009   Klippel-Feil syndrome 05/20/2009   HLD (hyperlipidemia) 03/15/2008   Clinical depression 06/13/2007     .1. Shortness of breath (Primary) Liely due to RLD from chest wall deformity. Former smoker so need to rule out COPD - CT Chest High Resolution; Future - Pulmonary function test; Future - CBC with Differential/Platelet - Basic Metabolic Panel (BMET)  2. Obesity, morbid (HCC) Obesity Counseling: Had a lengthy discussion regarding patients BMI and weight issues. Patient was instructed on portion control as well as increased activity. Also discussed caloric restrictions with trying to maintain intake less than 2000 Kcal. Discussions were made in accordance with the 5As of weight management. Simple actions such as not eating late and if able to, taking a walk is suggested.   3. Kyphoscoliosis Follow up imaging will get CT chest - Vitamin D 1,25 dihydroxy - Calcium   4. Other hypersomnia He has high Hgb currently on nocturnal oxygen  and has significant hypersomnia and should be ruled out for sleep disordered breathing - PSG Sleep Study; Future    General Counseling: I have discussed the findings of the evaluation and examination with [place].  I have also discussed any further diagnostic evaluation thatmay be needed or ordered today. Terance verbalizes understanding of the findings of todays visit. We also reviewed his medications today and discussed drug interactions and side effects including but not limited excessive drowsiness and altered mental states. We also discussed that there is always a  risk not just to him but also people around him. he has been encouraged to call the office with any questions or concerns that should arise related to todays visit.  Orders Placed This  Encounter  Procedures   CT Chest High Resolution    Standing Status:   Future    Expiration Date:   05/09/2024    Preferred imaging location?:   [facility]   Pulmonary function test    Standing Status:   Future    Expiration Date:   05/09/2024    Where should this test be performed?:   [facility]   PSG Sleep Study    Standing Status:   Future    Expiration Date:   05/09/2024    Where should this test be performed::   [facility]     Time spent: 25  I have personally obtained a history, examined the patient, evaluated laboratory and imaging results, formulated the assessment and plan and placed orders.    Elfreda DELENA Bathe, MD [facility] Pulmonary and Critical Care Sleep medicine

## 2023-05-12 ENCOUNTER — Telehealth: Payer: Self-pay | Admitting: Internal Medicine

## 2023-05-12 NOTE — Telephone Encounter (Signed)
 SS order faxed to [facility]; [phone]

## 2023-05-16 ENCOUNTER — Telehealth: Payer: Self-pay | Admitting: Internal Medicine

## 2023-05-16 NOTE — Telephone Encounter (Signed)
 Lvm notifying patient of CT appointment date, arrival time, location-[place]

## 2023-05-20 ENCOUNTER — Ambulatory Visit: Payer: 59

## 2023-05-24 ENCOUNTER — Encounter: Payer: Self-pay | Admitting: Family Medicine

## 2023-05-24 ENCOUNTER — Ambulatory Visit (INDEPENDENT_AMBULATORY_CARE_PROVIDER_SITE_OTHER): Payer: 59 | Admitting: Family Medicine

## 2023-05-24 VITALS — BP 88/58 | HR 99

## 2023-05-24 DIAGNOSIS — N3090 Cystitis, unspecified without hematuria: Secondary | ICD-10-CM

## 2023-05-24 DIAGNOSIS — L03115 Cellulitis of right lower limb: Secondary | ICD-10-CM

## 2023-05-24 DIAGNOSIS — R109 Unspecified abdominal pain: Secondary | ICD-10-CM

## 2023-05-24 DIAGNOSIS — I10 Essential (primary) hypertension: Secondary | ICD-10-CM

## 2023-05-24 DIAGNOSIS — Z7984 Long term (current) use of oral hypoglycemic drugs: Secondary | ICD-10-CM

## 2023-05-24 DIAGNOSIS — E1142 Type 2 diabetes mellitus with diabetic polyneuropathy: Secondary | ICD-10-CM

## 2023-05-24 DIAGNOSIS — R350 Frequency of micturition: Secondary | ICD-10-CM

## 2023-05-24 LAB — POCT URINALYSIS DIPSTICK
Glucose, UA: POSITIVE — AB
Ketones, UA: NEGATIVE
Nitrite, UA: NEGATIVE
Protein, UA: NEGATIVE
Spec Grav, UA: 1.01 (ref 1.010–1.025)
Urobilinogen, UA: 0.2 U/dL
pH, UA: 6.5 (ref 5.0–8.0)

## 2023-05-24 MED ORDER — SULFAMETHOXAZOLE-TRIMETHOPRIM 800-160 MG PO TABS: 1.0000 | ORAL_TABLET | Freq: Two times a day (BID) | ORAL | 0 refills | Status: AC

## 2023-05-24 NOTE — Progress Notes (Signed)
Established patient visit   Patient: Scott [name]   DOB: [birth year]   53 y.o. Male  MRN: [id number] Visit Date: 05/24/2023  Today's healthcare provider: Ronnald Ramp, MD   Chief Complaint  Patient presents with   Cellulitis    R Leg, he has been treated by Dr Sherrie Mustache a month ago    Urinary Tract Infection    Burning urinate this has been going on for about a week   Subjective     HPI     Cellulitis    Additional comments: R Leg, he has been treated by Dr Sherrie Mustache a month ago         Urinary Tract Infection    Additional comments: Burning urinate this has been going on for about a week      Last edited by Thedora Hinders, CMA on 05/24/2023  2:02 PM.       Discussed the use of AI scribe software for clinical note transcription with the patient, who gave verbal consent to proceed.  History of Present Illness   Scott [name] is a 53 year old male with diabetes who presents with concerns for a urinary tract infection and follow-up for cellulitis.  He experiences burning during urination and left-sided flank pain radiating to his back, which he associates with a possible urinary tract infection. There is no visible hematuria, but he has significant unexplained weight loss over the past few months, dropping from 200 pounds to 176 pounds. No fevers or chills are present.  He is following up for cellulitis in his leg and foot, previously treated with antibiotics. Although the initial infection cleared, the cellulitis seems to be recurring, with some swelling and redness present. Ankle-brachial index scans to check circulation were normal.  He has a history of diabetes and is currently not under the care of an endocrinologist since his previous doctor retired. Despite his weight loss, his fasting blood sugar levels remain elevated at around 135 mg/dL. He is concerned about his diabetes management and its potential impact on his symptoms.  He experiences tingling and  numbness in his feet, which he describes as neuropathy-like symptoms. He has not started any medication for this but is concerned about potential side effects and dependency on medications like gabapentin.  He mentions a history of facial flushing, which he associates with his medication use, specifically spironolactone. He is currently taking Farxiga 10 mg, which he understands may cause glucosuria, and has not been using AndroGel due to lack of endocrinologist supervision.         Past Medical History:  Diagnosis Date   Acid reflux    Acute renal failure superimposed on stage 2 chronic kidney disease (HCC) 12/30/2019   Anxiety    Arthritis    Asthma    Chronic heart failure with preserved ejection fraction (HFpEF) (HCC)    a. 02/2020 Echo: EF 60-65%, no rwma, nl RV fxn.   Contact dermatitis due to Genus Toxicodendron 09/11/2014   COPD (chronic obstructive pulmonary disease) (HCC)    Depression    Dysfunction of eustachian tube 09/11/2014   Enteritis presumed infectious 06/11/2008   Fall from slipping on ice 05/19/2020   Glaucoma    Groin strain 09/11/2014   History of stress test    a. 02/2018 MV: No ischemia/infarct. EF 35% (GI uptake - >55% by echo).  Low risk study.   Hyperlipidemia    Hypertension    Klippel-Feil syndrome     Medications:  Outpatient Medications Prior to Visit  Medication Sig   albuterol (VENTOLIN HFA) 108 (90 Base) MCG/ACT inhaler Inhale 2 puffs into the lungs every 4 (four) hours as needed for wheezing or shortness of breath.   ANDROGEL PUMP 20.25 MG/ACT (1.62%) GEL Apply 1 application topically daily.    bisoprolol (ZEBETA) 5 MG tablet TAKE 1/2 TABLET(2.5 MG) BY MOUTH DAILY   blood glucose meter kit and supplies Use 1-2 times a day as needed.Dispense based on patient and insurance preference. (FOR ICD-10 E10.9, E11.9).   budesonide-formoterol (SYMBICORT) 80-4.5 MCG/ACT inhaler Inhale 2 puffs into the lungs 2 (two) times daily. (Patient not taking:  Reported on 05/24/2023)   cabergoline (DOSTINEX) 0.5 MG tablet Take by mouth.   dapagliflozin propanediol (FARXIGA) 10 MG TABS tablet Take 1 tablet (10 mg total) by mouth daily.   gabapentin (NEURONTIN) 300 MG capsule Take 1 capsule (300 mg total) by mouth 2 (two) times daily. (Patient not taking: Reported on 05/24/2023)   glucose blood (ONETOUCH VERIO) test strip USE TO CHECK BLOOD SUGAR EVERY DAY   hydrochlorothiazide (HYDRODIURIL) 12.5 MG tablet Take 12.5 mg by mouth every morning. (Patient not taking: Reported on 05/24/2023)   Lancets (ONETOUCH DELICA PLUS LANCET30G) MISC 1 each by Does not apply route daily. (Patient not taking: Reported on 05/24/2023)   Lancets (ONETOUCH ULTRASOFT) lancets Use with testing once daily and as neededUse as instructed   latanoprost (XALATAN) 0.005 % ophthalmic solution Place 1 drop into both eyes at bedtime.   mupirocin cream (BACTROBAN) 2 % Apply 1 Application topically 2 (two) times daily. (Patient not taking: Reported on 05/24/2023)   mupirocin ointment (BACTROBAN) 2 % Apply to rash on chin twice a day until clear (Patient not taking: Reported on 05/24/2023)   ofloxacin (OCUFLOX) 0.3 % ophthalmic solution Place 1 drop into the left eye. (Patient not taking: Reported on 05/24/2023)   PARoxetine (PAXIL) 40 MG tablet Take 1 tablet (40 mg total) by mouth daily.   potassium chloride SA (KLOR-CON M) 20 MEQ tablet TAKE 2 TABLETS(40 MEQ) BY MOUTH TWICE DAILY   spironolactone (ALDACTONE) 50 MG tablet Take 1 tablet (50 mg total) by mouth daily.   torsemide (DEMADEX) 20 MG tablet TAKE 3 TABLETS(60 MG) BY MOUTH DAILY   triamcinolone ointment (KENALOG) 0.5 % Apply to rash in ears twice a day until clear (Patient not taking: Reported on 05/24/2023)   TRULICITY 1.5 MG/0.5ML SOPN Inject 1.5 mg into the skin once a week.   No facility-administered medications prior to visit.    Review of Systems  Last CBC Lab Results  Component Value Date   WBC 10.6 09/02/2022   HGB 14.5  09/02/2022   HCT 44.7 09/02/2022   MCV 91 09/02/2022   MCH 29.6 09/02/2022   RDW 12.9 09/02/2022   PLT 350 09/02/2022   Last metabolic panel Lab Results  Component Value Date   GLUCOSE 189 (H) 09/02/2022   NA 138 09/02/2022   K 4.1 09/02/2022   CL 91 (L) 09/02/2022   CO2 34 (H) 09/02/2022   BUN 18 09/02/2022   CREATININE 1.17 09/02/2022   EGFR 75 09/02/2022   CALCIUM 9.1 09/02/2022   PROT 6.8 09/02/2022   ALBUMIN 4.1 09/02/2022   LABGLOB 2.7 09/02/2022   AGRATIO 1.5 09/02/2022   BILITOT 0.3 09/02/2022   ALKPHOS 140 (H) 09/02/2022   AST 14 09/02/2022   ALT 11 09/02/2022   ANIONGAP 9 07/01/2020   Last hemoglobin A1c Lab Results  Component Value Date   HGBA1C  7.5 (H) 09/02/2022        Objective    BP (!) 88/58 (BP Location: Right Arm, Patient Position: Sitting, Cuff Size: Small)   Pulse 99   SpO2 94%  BP Readings from Last 3 Encounters:  05/24/23 (!) 88/58  05/10/23 116/70  05/02/23 128/73   Wt Readings from Last 3 Encounters:  05/10/23 184 lb (83.5 kg)  05/02/23 183 lb 6.4 oz (83.2 kg)  02/10/23 199 lb 12.8 oz (90.6 kg)        Physical Exam  musculoskeletal: erythematous right lower extremity ABD: left flank tenderness   Results for orders placed or performed in visit on 05/24/23  POCT Urinalysis Dipstick  Result Value Ref Range   Color, UA dark yellow    Clarity, UA cloudy    Glucose, UA Positive (A) Negative   Bilirubin, UA Moderate    Ketones, UA Negative    Spec Grav, UA 1.010 1.010 - 1.025   Blood, UA large    pH, UA 6.5 5.0 - 8.0   Protein, UA Negative Negative   Urobilinogen, UA 0.2 0.2 or 1.0 E.U./dL   Nitrite, UA Negative    Leukocytes, UA Moderate (2+) (A) Negative   Appearance     Odor      Assessment & Plan     Problem List Items Addressed This Visit       Cardiovascular and Mediastinum   Primary hypertension     Endocrine   Diabetic polyneuropathy associated with type 2 diabetes mellitus (HCC)   Relevant Orders    Hemoglobin A1c     Other   Cellulitis of right lower extremity - Primary   Relevant Medications   sulfamethoxazole-trimethoprim (BACTRIM DS) 800-160 MG tablet   Other Relevant Orders   CBC   Other Visit Diagnoses       Frequency of urination       Relevant Orders   Urine Culture   POCT Urinalysis Dipstick (Completed)     Cystitis       Relevant Medications   sulfamethoxazole-trimethoprim (BACTRIM DS) 800-160 MG tablet   Other Relevant Orders   CMP14+EGFR     Flank pain       Relevant Orders   CBC   CMP14+EGFR          Urinary Tract Infection (UTI) Presents with symptoms of UTI, including dysuria and left-sided flank pain. Urinalysis showed hematuria, pyuria, bilirubin, and glucosuria, likely due to diabetes medication. No fever or chills. Discussed hydration and avoiding sugary fluids to prevent diarrhea. Treatment may be adjusted based on urine culture results. acute - Prescribe Bactrim 800-160 mg, BID for 7 days - Advise hydration with non-sugary fluids  Cellulitis Recurrent cellulitis in the leg and foot with erythema, warmth, and swelling. ABI scans normal. Discussed using Bactrim to cover both UTI and cellulitis to minimize medications. acute, recurrent symptoms  - Prescribe Bactrim 800-160 mg, BID for 7 days  Diabetes Mellitus Reports unexplained weight loss and fasting blood sugar ~135 mg/dL. No current endocrinologist. Concerns about neuropathy symptoms (tingling, numbness). Discussed need for better diabetes management and potential insulin therapy. Emphasized regular follow-up and blood work to monitor kidney and liver function. - Refer to Dr. Sherrie Mustache for diabetes management - Order A1c test - Order CBC and complete metabolic panel - Discuss potential insulin therapy with Dr. Sherrie Mustache  Peripheral Neuropathy Reports foot tingling and numbness, likely related to diabetes. Concerns about gabapentin due to potential long-term use and side effects. Discussed  alternatives like Lyrica  and importance of symptom management to improve quality of life. Potential need for long-term medication use and dose adjustments based on effectiveness. - Recommend starting gabapentin 300 mg at night - Provide information on Lyrica for discussion with Dr. Sherrie Mustache  General Health Maintenance Blood pressure noted to be low. Emphasized hydration and regular follow-up with cardiology for blood pressure management. - Ensure adequate hydration - Follow up with cardiology for blood pressure management         Return in about 3 weeks (around 06/14/2023) for DM,neuropathy .         Ronnald Ramp, MD  [facility] [phone] (phone) [phone] (fax)  [facility] - [facility]

## 2023-05-25 ENCOUNTER — Encounter: Payer: Self-pay | Admitting: Family Medicine

## 2023-05-25 ENCOUNTER — Encounter: Payer: 59 | Admitting: Internal Medicine

## 2023-05-25 LAB — CBC
Hematocrit: 45.7 % (ref 37.5–51.0)
Hemoglobin: 14.8 g/dL (ref 13.0–17.7)
MCH: 29 pg (ref 26.6–33.0)
MCHC: 32.4 g/dL (ref 31.5–35.7)
MCV: 89 fL (ref 79–97)
Platelets: 360 10*3/uL (ref 150–450)
RBC: 5.11 x10E6/uL (ref 4.14–5.80)
RDW: 12.6 % (ref 11.6–15.4)
WBC: 10.6 10*3/uL (ref 3.4–10.8)

## 2023-05-25 LAB — CMP14+EGFR
ALT: 30 [IU]/L (ref 0–44)
AST: 16 [IU]/L (ref 0–40)
Albumin: 3.9 g/dL (ref 3.8–4.9)
Alkaline Phosphatase: 135 [IU]/L — ABNORMAL HIGH (ref 44–121)
BUN/Creatinine Ratio: 15 (ref 9–20)
BUN: 16 mg/dL (ref 6–24)
Bilirubin Total: 0.3 mg/dL (ref 0.0–1.2)
CO2: 33 mmol/L — ABNORMAL HIGH (ref 20–29)
Calcium: 9.9 mg/dL (ref 8.7–10.2)
Chloride: 90 mmol/L — ABNORMAL LOW (ref 96–106)
Creatinine, Ser: 1.07 mg/dL (ref 0.76–1.27)
Globulin, Total: 3.2 g/dL (ref 1.5–4.5)
Glucose: 187 mg/dL — ABNORMAL HIGH (ref 70–99)
Potassium: 4.9 mmol/L (ref 3.5–5.2)
Sodium: 136 mmol/L (ref 134–144)
Total Protein: 7.1 g/dL (ref 6.0–8.5)
eGFR: 83 mL/min/{1.73_m2} (ref >59)

## 2023-05-25 LAB — HEMOGLOBIN A1C
Est. average glucose Bld gHb Est-mCnc: 203 mg/dL
Hgb A1c MFr Bld: 8.7 % — ABNORMAL HIGH (ref 4.8–5.6)

## 2023-05-26 ENCOUNTER — Other Ambulatory Visit: Payer: Self-pay | Admitting: Cardiovascular Disease

## 2023-05-27 LAB — URINE CULTURE

## 2023-05-27 LAB — SPECIMEN STATUS REPORT

## 2023-05-27 MED ORDER — NITROFURANTOIN MONOHYD MACRO 100 MG PO CAPS: 100.0000 mg | ORAL_CAPSULE | Freq: Two times a day (BID) | ORAL | 0 refills | Status: AC

## 2023-05-27 NOTE — Addendum Note (Signed)
Addended by: Bing Neighbors on: 05/27/2023 02:38 PM   Modules accepted: Orders

## 2023-05-30 ENCOUNTER — Telehealth: Payer: Self-pay | Admitting: Cardiovascular Disease

## 2023-05-30 NOTE — Telephone Encounter (Signed)
 Called patient back about message. Informed patient that spirolactone 50 mg is on his list, but it's only listed once daily, not twice daily. Patient stated he has been taking it twice daily. Patient wanting to know how long he has been taking twice daily. Informed patient that it was first reported 06/2022 by patient that he was taking it two a day, and then was reordered that way. Informed patient that we will clarify with Dr. Mariah Milling, to see if he should be on Spirolactone daily or BID.  Patient agreed to plan.

## 2023-05-30 NOTE — Telephone Encounter (Signed)
 Pt c/o medication issue:  1. Name of Medication:   spironolactone (ALDACTONE) 50 MG tablet   2. How are you currently taking this medication (dosage and times per day)?   3. Are you having a reaction (difficulty breathing--STAT)?   4. What is your medication issue?   Patient wants a call back to confirm how he should be taking this medication.  Patient stated he has been taking this medication twice daily and is now running out of medication.  Patient stated he will need a refill sent to [id number] - [place], [place] - [address] AT [facility] OF [address].

## 2023-06-01 ENCOUNTER — Telehealth: Payer: Self-pay | Admitting: Internal Medicine

## 2023-06-01 NOTE — Telephone Encounter (Signed)
 per Mindi Junker w/ [facility], office didn't receive faxed order. Emailed to her-Toni

## 2023-06-03 ENCOUNTER — Other Ambulatory Visit: Payer: Self-pay

## 2023-06-03 ENCOUNTER — Telehealth: Payer: Self-pay | Admitting: Cardiovascular Disease

## 2023-06-03 MED ORDER — SPIRONOLACTONE 50 MG PO TABS: 50.0000 mg | ORAL_TABLET | Freq: Every day | ORAL | 3 refills | Status: DC

## 2023-06-03 NOTE — Telephone Encounter (Signed)
 Pt made aware of MD's recommendations and verbalized understanding.    Antonieta Iba, MD  Jani Gravel, RN54 minutes ago (1:24 PM)    Typically would recommend spironolactone 50 mg once a day Would suggested drop back to once a day Monitor blood pressure on the reduced dosing Thx TGollan

## 2023-06-03 NOTE — Telephone Encounter (Signed)
 Pt c/o medication issue:  1. Name of Medication: Spironolactone  2. How are you currently taking this medication (dosage and times per day)?   3. Are you having a reaction (difficulty breathing--STAT)?   4. What is your medication issue? The pharmacist said it was too soon for get to get it refilled. Patient was taking it wrong, that is why he ran out of it. He wants to know what can you do to get him some more called in please.

## 2023-06-03 NOTE — Telephone Encounter (Signed)
 Called patient, advised we could try to have pharmacy do an emergency refill on the medication.   Patient verbalized understanding./

## 2023-06-10 ENCOUNTER — Telehealth: Payer: Self-pay | Admitting: Cardiovascular Disease

## 2023-06-10 DIAGNOSIS — R609 Edema, unspecified: Secondary | ICD-10-CM

## 2023-06-10 MED ORDER — TORSEMIDE 20 MG PO TABS: ORAL_TABLET | ORAL | 1 refills | Status: DC

## 2023-06-10 NOTE — Telephone Encounter (Signed)
*  STAT* If patient is at the pharmacy, call can be transferred to refill team.   1. Which medications need to be refilled? (please list name of each medication and dose if known) torsemide (DEMADEX) 20 MG tablet  2. Which pharmacy/location (including street and city if local pharmacy) is medication to be sent to? [id number] - [place], [place] - [address] AT [address] OF [address]  3. Do they need a 30 day or 90 day supply?  90 day supply

## 2023-06-10 NOTE — Telephone Encounter (Signed)
 Refill request sent to preferred pharmacy.

## 2023-06-20 ENCOUNTER — Encounter: Payer: Self-pay | Admitting: Family Medicine

## 2023-06-20 ENCOUNTER — Ambulatory Visit: Payer: 59 | Admitting: Family Medicine

## 2023-06-20 VITALS — BP 113/79 | HR 86 | Resp 16 | Wt 189.8 lb

## 2023-06-20 DIAGNOSIS — I5032 Chronic diastolic (congestive) heart failure: Secondary | ICD-10-CM | POA: Diagnosis not present

## 2023-06-20 DIAGNOSIS — L03115 Cellulitis of right lower limb: Secondary | ICD-10-CM

## 2023-06-20 DIAGNOSIS — E1142 Type 2 diabetes mellitus with diabetic polyneuropathy: Secondary | ICD-10-CM

## 2023-06-20 MED ORDER — SPIRONOLACTONE 25 MG PO TABS: 25.0000 mg | ORAL_TABLET | Freq: Two times a day (BID) | ORAL | 1 refills | Status: DC

## 2023-06-20 MED ORDER — TIRZEPATIDE 2.5 MG/0.5ML ~~LOC~~ SOAJ: 2.5000 mg | SUBCUTANEOUS | 0 refills | Status: DC

## 2023-06-20 NOTE — Patient Instructions (Signed)
 Marland Kitchen  Please review the attached list of medications and notify my office if there are any errors.   . Please bring all of your medications to every appointment so we can make sure that our medication list is the same as yours.

## 2023-06-20 NOTE — Progress Notes (Unsigned)
 Established patient visit   Patient: Scott [name]   DOB: 1971-04-01   53 y.o. Male  MRN: [id number] Visit Date: 06/20/2023  Today's healthcare provider: Mila Merry, MD   Chief Complaint  Patient presents with   Medical Management of Chronic Issues    Follow-up DM and Neuropathy   Subjective    HPI Presents for follow up dm since his endocrinologist has retired.  Lab Results  Component Value Date   HGBA1C 8.7 (H) 05/24/2023   HGBA1C 7.5 (H) 09/02/2022   HGBA1C 8.4 03/26/2022   Stopped taking Trulicity a few months ago because he didn't feel it was working. Previously on Ozempic which caused problems with stomach. Continues on Farxiga. Home sugars have been running in the upper 100s.   Also follow up cellulitis from 05/23/22. Has finished septra antibiotic and much better, but having more swelling in legs and some redness. He also reports he ran out of spironolactone several weeks ago because he was taking 2 a day, although was written by Dr. Mariah Milling as one a day. He is noted to have been hypotensive at his 05/23/22 visit and presumed to have been taking it at that time.  Wt Readings from Last 3 Encounters:  06/20/23 189 lb 12.8 oz (86.1 kg)  05/10/23 184 lb (83.5 kg)  05/02/23 183 lb 6.4 oz (83.2 kg)   BP Readings from Last 3 Encounters:  06/20/23 113/79  05/24/23 (!) 88/58  05/10/23 116/70   Lab Results  Component Value Date   NA 136 05/24/2023   K 4.9 05/24/2023   CREATININE 1.07 05/24/2023   EGFR 83 05/24/2023   GLUCOSE 187 (H) 05/24/2023     Medications: Outpatient Medications Prior to Visit  Medication Sig   albuterol (VENTOLIN HFA) 108 (90 Base) MCG/ACT inhaler Inhale 2 puffs into the lungs every 4 (four) hours as needed for wheezing or shortness of breath.   ANDROGEL PUMP 20.25 MG/ACT (1.62%) GEL Apply 1 application topically daily.    bisoprolol (ZEBETA) 5 MG tablet TAKE 1/2 TABLET(2.5 MG) BY MOUTH DAILY   blood glucose meter kit and supplies Use 1-2  times a day as needed.Dispense based on patient and insurance preference. (FOR ICD-10 E10.9, E11.9).   budesonide-formoterol (SYMBICORT) 80-4.5 MCG/ACT inhaler Inhale 2 puffs into the lungs 2 (two) times daily.   cabergoline (DOSTINEX) 0.5 MG tablet Take by mouth.   glucose blood (ONETOUCH VERIO) test strip USE TO CHECK BLOOD SUGAR EVERY DAY   Lancets (ONETOUCH DELICA PLUS LANCET30G) MISC 1 each by Does not apply route daily.   latanoprost (XALATAN) 0.005 % ophthalmic solution Place 1 drop into both eyes at bedtime.   ofloxacin (OCUFLOX) 0.3 % ophthalmic solution Place 1 drop into the left eye.   PARoxetine (PAXIL) 40 MG tablet Take 1 tablet (40 mg total) by mouth daily.   potassium chloride SA (KLOR-CON M) 20 MEQ tablet TAKE 2 TABLETS(40 MEQ) BY MOUTH TWICE DAILY   torsemide (DEMADEX) 20 MG tablet TAKE 3 TABLETS(60 MG) BY MOUTH DAILY   TRULICITY 1.5 MG/0.5ML SOPN Inject 1.5 mg into the skin once a week. (NOT TAKING)   dapagliflozin propanediol (FARXIGA) 10 MG TABS tablet Take 1 tablet (10 mg total) by mouth daily.   gabapentin (NEURONTIN) 300 MG capsule Take 1 capsule (300 mg total) by mouth 2 (two) times daily. (Patient not taking: Reported on 06/20/2023)   hydrochlorothiazide (HYDRODIURIL) 12.5 MG tablet Take 12.5 mg by mouth every morning. (Patient not taking: Reported on 06/20/2023)  spironolactone (ALDACTONE) 50 MG tablet Take 1 tablet (50 mg total) by mouth daily. (Patient not taking: Reported on 06/20/2023)   No facility-administered medications prior to visit.       Objective    BP 113/79 (BP Location: Left Arm, Patient Position: Sitting, Cuff Size: Normal)   Pulse 86   Resp 16   Wt 189 lb 12.8 oz (86.1 kg)   SpO2 94%   BMI 34.71 kg/m  {Insert last BP/Wt (optional):23777}{See vitals history (optional):1}  Physical Exam  General appearance: Mildly obese male, cooperative and in no acute distress Head: Normocephalic, without obvious abnormality, atraumatic Respiratory:  Respirations even and unlabored, normal respiratory rate Extremities: All extremities are intact. 3+ pitting edema both Les.  Skin: Skin color, texture, turgor normal. No rashes seen. Small patch of dull red non blanching non tender erythema bilateral lower legs near ankles.  Psych: Appropriate mood and affect. Neurologic: Mental status: Alert, oriented to person, place, and time, thought content appropriate.   Assessment & Plan     1. Diabetic polyneuropathy associated with type 2 diabetes mellitus (HCC) (Primary) His endocrinologist has retired. Off Trulicity for a few months since it didn't seems to be working well, was intolerant to Ozempic due to GI effects. Will try tirzepatide [facility]) 2.5 MG/0.5ML Pen; Inject 2.5 mg into the skin once a week.  Dispense: 2 mL; Refill: 0  2. Cellulitis of right lower extremity Resolved, but with some residual skin changes exacerbated by chronic edema.   3. Chronic diastolic heart failure (HCC) Off spironolactone about a month since he was taking two 50mg  tablets a day instead of just one which was on the prescription. Noted to have been hypotensive at last visit when he was taking 50mg  twice a day.   Resume lower dose of spironolactone (ALDACTONE) at 25 MG tablet; Take 1 tablet (25 mg total) by mouth 2 (two) times daily.  Dispense: 60 tablet; Refill: 1   Check at electrolytes at follow up appointment.         Mila Merry, MD  [facility] [phone] (phone) [phone] (fax)  [facility]

## 2023-06-21 ENCOUNTER — Telehealth: Payer: Self-pay | Admitting: Family Medicine

## 2023-06-21 NOTE — Telephone Encounter (Signed)
 [facility] pharmacy is requesting refill spironolactone (ALDACTONE) 25 MG tablet  Please advise

## 2023-06-21 NOTE — Telephone Encounter (Signed)
 Filled yesterday

## 2023-06-29 ENCOUNTER — Telehealth: Payer: Self-pay | Admitting: Internal Medicine

## 2023-06-29 ENCOUNTER — Ambulatory Visit (INDEPENDENT_AMBULATORY_CARE_PROVIDER_SITE_OTHER): Admitting: Emergency Medicine

## 2023-06-29 VITALS — Ht 62.0 in | Wt 180.0 lb

## 2023-06-29 DIAGNOSIS — Z0000 Encounter for general adult medical examination without abnormal findings: Secondary | ICD-10-CM | POA: Diagnosis not present

## 2023-06-29 DIAGNOSIS — E1142 Type 2 diabetes mellitus with diabetic polyneuropathy: Secondary | ICD-10-CM

## 2023-06-29 NOTE — Progress Notes (Signed)
 Subjective:   Scott [name] is a 53 y.o. who presents for a Medicare Wellness preventive visit.   Visit Complete: Virtual I connected with  Scott [name] on 06/29/23 by a audio enabled telemedicine application and verified that I am speaking with the correct person using two identifiers.  Patient Location: Home  Provider Location: Home Office  I discussed the limitations of evaluation and management by telemedicine. The patient expressed understanding and agreed to proceed.  Vital Signs: Because this visit was a virtual/telehealth visit, some criteria may be missing or patient reported. Any vitals not documented were not able to be obtained and vitals that have been documented are patient reported.  VideoDeclined- This patient declined Librarian, academic. Therefore the visit was completed with audio only.  Persons Participating in Visit: Patient.  AWV Questionnaire: No: Patient Medicare AWV questionnaire was not completed prior to this visit.  Cardiac Risk Factors include: male gender;diabetes mellitus;dyslipidemia;hypertension;obesity (BMI >30kg/m2);sedentary lifestyle     Objective:    Today's Vitals   06/29/23 0849  Weight: 180 lb (81.6 kg)  Height: 5\' 2"  (1.575 m)  PainSc: 6    Body mass index is 32.92 kg/m.     06/29/2023    9:10 AM 06/28/2022    9:55 AM 04/22/2021   10:36 AM 04/16/2020   11:26 AM 12/29/2019    8:46 PM 04/12/2019   11:14 AM 04/10/2018    1:46 PM  Advanced Directives  Does Patient Have a Medical Advance Directive? No No No No No No No  Would patient like information on creating a medical advance directive? Yes (MAU/Ambulatory/Procedural Areas - Information given)  No - Patient declined No - Patient declined No - Patient declined No - Patient declined     Current Medications (verified) Outpatient Encounter Medications as of 06/29/2023  Medication Sig   albuterol (VENTOLIN HFA) 108 (90 Base) MCG/ACT inhaler Inhale 2 puffs into  the lungs every 4 (four) hours as needed for wheezing or shortness of breath.   bisoprolol (ZEBETA) 5 MG tablet TAKE 1/2 TABLET(2.5 MG) BY MOUTH DAILY   blood glucose meter kit and supplies Use 1-2 times a day as needed.Dispense based on patient and insurance preference. (FOR ICD-10 E10.9, E11.9).   cabergoline (DOSTINEX) 0.5 MG tablet Take by mouth.   dapagliflozin propanediol (FARXIGA) 10 MG TABS tablet Take 1 tablet (10 mg total) by mouth daily.   glucose blood (ONETOUCH VERIO) test strip USE TO CHECK BLOOD SUGAR EVERY DAY   Lancets (ONETOUCH DELICA PLUS LANCET30G) MISC 1 each by Does not apply route daily.   latanoprost (XALATAN) 0.005 % ophthalmic solution Place 1 drop into both eyes at bedtime.   PARoxetine (PAXIL) 40 MG tablet Take 1 tablet (40 mg total) by mouth daily.   potassium chloride SA (KLOR-CON M) 20 MEQ tablet TAKE 2 TABLETS(40 MEQ) BY MOUTH TWICE DAILY   spironolactone (ALDACTONE) 25 MG tablet Take 1 tablet (25 mg total) by mouth 2 (two) times daily.   tirzepatide [facility]) 2.5 MG/0.5ML Pen Inject 2.5 mg into the skin once a week.   torsemide (DEMADEX) 20 MG tablet TAKE 3 TABLETS(60 MG) BY MOUTH DAILY   ANDROGEL PUMP 20.25 MG/ACT (1.62%) GEL Apply 1 application topically daily.  (Patient not taking: Reported on 06/29/2023)   budesonide-formoterol (SYMBICORT) 80-4.5 MCG/ACT inhaler Inhale 2 puffs into the lungs 2 (two) times daily. (Patient not taking: Reported on 06/29/2023)   gabapentin (NEURONTIN) 300 MG capsule Take 1 capsule (300 mg total) by mouth  2 (two) times daily. (Patient not taking: Reported on 05/24/2023)   hydrochlorothiazide (HYDRODIURIL) 12.5 MG tablet Take 12.5 mg by mouth every morning. (Patient not taking: Reported on 05/24/2023)   ofloxacin (OCUFLOX) 0.3 % ophthalmic solution Place 1 drop into the left eye. (Patient not taking: Reported on 06/29/2023)   TRULICITY 1.5 MG/0.5ML SOPN Inject 1.5 mg into the skin once a week. (Patient not taking: Reported on 06/29/2023)    No facility-administered encounter medications on file as of 06/29/2023.    Allergies (verified) Codeine   History: Past Medical History:  Diagnosis Date   Acid reflux    Acute renal failure superimposed on stage 2 chronic kidney disease (HCC) 12/30/2019   Anxiety    Arthritis    Asthma    Chronic heart failure with preserved ejection fraction (HFpEF) (HCC)    a. 02/2020 Echo: EF 60-65%, no rwma, nl RV fxn.   Contact dermatitis due to Genus Toxicodendron 09/11/2014   COPD (chronic obstructive pulmonary disease) (HCC)    Depression    Dysfunction of eustachian tube 09/11/2014   Enteritis presumed infectious 06/11/2008   Fall from slipping on ice 05/19/2020   Glaucoma    Groin strain 09/11/2014   History of stress test    a. 02/2018 MV: No ischemia/infarct. EF 35% (GI uptake - >55% by echo).  Low risk study.   Hyperlipidemia    Hypertension    Klippel-Feil syndrome    Past Surgical History:  Procedure Laterality Date   COLOSTOMY     DENVER SHUNT PLACEMENT     Family History  Problem Relation Age of Onset   Multiple sclerosis Mother    Colon cancer Father    Heart disease Father    Diabetes Sister    Lung cancer Maternal Grandmother    Thyroid cancer Maternal Grandfather    Ovarian cancer Paternal Grandmother    Cancer Paternal Grandfather    Social History   Socioeconomic History   Marital status: Significant Other    Spouse name: Not on file   Number of children: 0   Years of education: Not on file   Highest education level: Bachelor's degree (e.g., BA, AB, BS)  Occupational History   Occupation: disability  Tobacco Use   Smoking status: Former    Current packs/day: 0.00    Average packs/day: 1 pack/day for 10.0 years (10.0 ttl pk-yrs)    Types: Cigarettes    Start date: 05/1996    Quit date: 05/2006    Years since quitting: 17.1   Smokeless tobacco: Never   Tobacco comments:    quit 05/2006  Vaping Use   Vaping status: Former   Devices: tried  once  Substance and Sexual Activity   Alcohol use: No    Alcohol/week: 0.0 standard drinks of alcohol   Drug use: No   Sexual activity: Not on file  Other Topics Concern   Not on file  Social History Narrative   Not on file   Social Drivers of Health   Financial Resource Strain: Low Risk  (06/29/2023)   Overall Financial Resource Strain (CARDIA)    Difficulty of Paying Living Expenses: Not hard at all  Recent Concern: Financial Resource Strain - High Risk (05/02/2023)   Overall Financial Resource Strain (CARDIA)    Difficulty of Paying Living Expenses: Very hard  Food Insecurity: No Food Insecurity (06/29/2023)   Hunger Vital Sign    Worried About Running Out of Food in the Last Year: Never true    Ran  Out of Food in the Last Year: Never true  Recent Concern: Food Insecurity - Food Insecurity Present (05/02/2023)   Hunger Vital Sign    Worried About Running Out of Food in the Last Year: Often true    Ran Out of Food in the Last Year: Sometimes true  Transportation Needs: No Transportation Needs (06/29/2023)   PRAPARE - Administrator, Civil Service (Medical): No    Lack of Transportation (Non-Medical): No  Physical Activity: Inactive (06/29/2023)   Exercise Vital Sign    Days of Exercise per Week: 0 days    Minutes of Exercise per Session: 0 min  Stress: Stress Concern Present (06/29/2023)   [employer] of Occupational Health - Occupational Stress Questionnaire    Feeling of Stress : To some extent  Social Connections: Moderately Isolated (06/29/2023)   Social Connection and Isolation Panel [NHANES]    Frequency of Communication with Friends and Family: More than three times a week    Frequency of Social Gatherings with Friends and Family: Once a week    Attends Religious Services: Never    Database administrator or Organizations: No    Attends Banker Meetings: Never    Marital Status: Living with partner    Tobacco Counseling Counseling given:  Not Answered Tobacco comments: quit 05/2006    Clinical Intake:  Pre-visit preparation completed: Yes  Pain : 0-10 Pain Score: 6  Pain Type: Chronic pain Pain Location: Back Pain Descriptors / Indicators: Aching     BMI - recorded: 32.92 Nutritional Status: BMI > 30  Obese Nutritional Risks: None Diabetes: Yes CBG done?: No Did pt. bring in CBG monitor from home?: No  Lab Results  Component Value Date   HGBA1C 8.7 (H) 05/24/2023   HGBA1C 7.5 (H) 09/02/2022   HGBA1C 8.4 03/26/2022     How often do you need to have someone help you when you read instructions, pamphlets, or other written materials from your doctor or pharmacy?: 1 - Never  Interpreter Needed?: No  Information entered by :: Tora Kindred, CMA   Activities of Daily Living     06/29/2023    8:55 AM 09/21/2022   10:56 AM  In your present state of health, do you have any difficulty performing the following activities:  Hearing? 1 1  Comment decreased hearing right ear, declined referral for evaluation   Vision? 0 1  Difficulty concentrating or making decisions? 0 1  Walking or climbing stairs? 1 1  Comment due to sob   Dressing or bathing? 0 1  Doing errands, shopping? 1 1  Comment drives, but fiance goes with him   Preparing Food and eating ? N   Using the Toilet? N   In the past six months, have you accidently leaked urine? Y   Do you have problems with loss of bowel control? N   Managing your Medications? N   Managing your Finances? N   Housekeeping or managing your Housekeeping? N     Patient Care Team: Malva Limes, MD as PCP - General (Family Medicine) Mariah Milling Tollie Pizza, MD as PCP - Cardiology (Cardiology) Delma Freeze, FNP (Family Medicine) Patrecia Pace, Delsa Sale, MD as Attending Physician (Endocrinology) Irene Limbo., MD (Ophthalmology) Helane Gunther, DPM as Consulting Physician (Podiatry) Taylor, [place], [place] as Social Worker  Indicate any recent Medical Services you may  have received from other than [facility] providers in the past year (date may be approximate).  Assessment:   This is a routine wellness examination for Carlee.  Hearing/Vision screen Hearing Screening - Comments:: Has hearing loss, no hearing aids, declines referral for evaluation Vision Screening - Comments:: Gets DM eye exam, Dr. Hulen Luster, [place]   Goals Addressed             This Visit's Progress    Patient Stated       Lose 10 lbs       Depression Screen     06/29/2023    9:06 AM 05/24/2023    4:27 PM 09/21/2022   10:56 AM 09/06/2022   11:25 AM 09/02/2022    1:30 PM 08/06/2022    4:02 PM 06/28/2022    9:51 AM  PHQ 2/9 Scores  PHQ - 2 Score 0 0 1 0 0 2 1  PHQ- 9 Score 0 1 2 0 12 4     Fall Risk     06/29/2023    9:12 AM 09/21/2022   10:56 AM 09/02/2022    1:30 PM 08/06/2022    4:01 PM 06/28/2022    9:47 AM  Fall Risk   Falls in the past year? 0 1 1 1 1   Number falls in past yr: 0 0 1 1 1   Injury with Fall? 0 1 1 0 1  Comment     chose not to go to ER  Risk for fall due to : No Fall Risks History of fall(s)  History of fall(s) No Fall Risks  Follow up Falls prevention discussed;Falls evaluation completed Falls evaluation completed  Falls evaluation completed Education provided;Falls prevention discussed    MEDICARE RISK AT HOME:  Medicare Risk at Home Any stairs in or around the home?: Yes If so, are there any without handrails?: No Home free of loose throw rugs in walkways, pet beds, electrical cords, etc?: Yes Adequate lighting in your home to reduce risk of falls?: Yes Life alert?: No Use of a cane, walker or w/c?: No Grab bars in the bathroom?: No Shower chair or bench in shower?: No Elevated toilet seat or a handicapped toilet?: No  TIMED UP AND GO:  Was the test performed?  No  Cognitive Function: 6CIT completed        06/29/2023    9:13 AM 06/28/2022   10:06 AM 04/10/2018    1:54 PM  6CIT Screen  What Year? 0 points 0 points 0 points  What  month? 0 points 0 points 0 points  What time? 0 points 0 points 0 points  Count back from 20 0 points 0 points 0 points  Months in reverse 0 points 0 points 0 points  Repeat phrase 2 points 0 points 0 points  Total Score 2 points 0 points 0 points    Immunizations Immunization History  Administered Date(s) Administered   Moderna Sars-Covid-2 Vaccination 07/25/2020   PFIZER(Purple Top)SARS-COV-2 Vaccination 06/23/2019, 07/18/2019   Tdap 02/20/2018    Screening Tests Health Maintenance  Topic Date Due   Pneumococcal Vaccine 56-24 Years old (1 of 2 - PCV) Never done   OPHTHALMOLOGY EXAM  Never done   Diabetic kidney evaluation - Urine ACR  Never done   Colonoscopy  Never done   Zoster Vaccines- Shingrix (1 of 2) Never done   COVID-19 Vaccine (4 - 2024-25 season) 12/05/2022   INFLUENZA VACCINE  07/04/2023 (Originally 11/04/2022)   HEMOGLOBIN A1C  11/21/2023   Diabetic kidney evaluation - eGFR measurement  05/23/2024   Medicare Annual Wellness (AWV)  06/28/2024  DTaP/Tdap/Td (2 - Td or Tdap) 02/21/2028   Hepatitis C Screening  Completed   HIV Screening  Completed   HPV VACCINES  Aged Out    Health Maintenance  Health Maintenance Due  Topic Date Due   Pneumococcal Vaccine 43-3 Years old (1 of 2 - PCV) Never done   OPHTHALMOLOGY EXAM  Never done   Diabetic kidney evaluation - Urine ACR  Never done   Colonoscopy  Never done   Zoster Vaccines- Shingrix (1 of 2) Never done   COVID-19 Vaccine (4 - 2024-25 season) 12/05/2022   Health Maintenance Items Addressed: See Nurse Notes  Additional Screening:  Vision Screening: Recommended annual ophthalmology exams for early detection of glaucoma and other disorders of the eye.  Dental Screening: Recommended annual dental exams for proper oral hygiene  Community Resource Referral / Chronic Care Management: CRR required this visit?  No   CCM required this visit?  No     Plan:     I have personally reviewed and noted the  following in the patient's chart:   Medical and social history Use of alcohol, tobacco or illicit drugs  Current medications and supplements including opioid prescriptions. Patient is not currently taking opioid prescriptions. Functional ability and status Nutritional status Physical activity Advanced directives List of other physicians Hospitalizations, surgeries, and ER visits in previous 12 months Vitals Screenings to include cognitive, depression, and falls Referrals and appointments  In addition, I have reviewed and discussed with patient certain preventive protocols, quality metrics, and best practice recommendations. A written personalized care plan for preventive services as well as general preventive health recommendations were provided to patient.     Tora Kindred, CMA   06/29/2023   After Visit Summary: (MyChart) Due to this being a telephonic visit, the after visit summary with patients personalized plan was offered to patient via MyChart   Notes:  6 CIT - 2 May need pneumonia vaccine due to COPD Declined colonoscopy and cologuard Having hearing loss, but declined referral for evaluation. I have requested the results of his most recent DM eye exam Patient states he is UTD on Covid Placed referral to DM & Nutrition education per patient's request

## 2023-06-29 NOTE — Telephone Encounter (Signed)
 Left vm and sent mychart message to confirm 07/06/23 appointment-Toni

## 2023-06-29 NOTE — Patient Instructions (Addendum)
 [name] , Thank you for taking time to come for your Medicare Wellness Visit. I appreciate your ongoing commitment to your health goals. Please review the following plan we discussed and let me know if I can assist you in the future.   Referrals/Orders/Follow-Ups/Clinician Recommendations: I have requested your most recent diabetic eye exam from Dr. Hulen Luster. Your may need a pneumonia vaccine due to COPD. Discuss with Dr. Sherrie Mustache at next OV on 07/22/23. You are due for colonoscopy or cologuard for colon cancer screening.  This is a list of the screening recommended for you and due dates:  Health Maintenance  Topic Date Due   Pneumococcal Vaccination (1 of 2 - PCV) Never done   Eye exam for diabetics  Never done   Yearly kidney health urinalysis for diabetes  Never done   Colon Cancer Screening  Never done   Zoster (Shingles) Vaccine (1 of 2) Never done   COVID-19 Vaccine (4 - 2024-25 season) 12/05/2022   Flu Shot  07/04/2023*   Hemoglobin A1C  11/21/2023   Yearly kidney function blood test for diabetes  05/23/2024   Medicare Annual Wellness Visit  06/28/2024   DTaP/Tdap/Td vaccine (2 - Td or Tdap) 02/21/2028   Hepatitis C Screening  Completed   HIV Screening  Completed   HPV Vaccine  Aged Out  *Topic was postponed. The date shown is not the original due date.    Advanced directives: (ACP Link)Information on Advanced Care Planning can be found at [facility] Advance Health Care Directives Advance Health Care Directives. [email or web address] You may also get these forms at your doctor's office.  Once you have completed the forms, please bring a copy of your health care power of attorney and living will to the office to be added to your chart at your convenience.   Next Medicare Annual Wellness Visit scheduled for next year: Yes, 07/04/24 @ 8:50am (phone visit)

## 2023-07-05 ENCOUNTER — Telehealth: Payer: Self-pay | Admitting: Internal Medicine

## 2023-07-05 NOTE — Telephone Encounter (Signed)
 Lvm & sent mychart msg to move 07/06/23 appointment-Toni

## 2023-07-06 ENCOUNTER — Telehealth: Payer: Self-pay | Admitting: Internal Medicine

## 2023-07-06 ENCOUNTER — Encounter: Payer: 59 | Admitting: Internal Medicine

## 2023-07-06 NOTE — Telephone Encounter (Signed)
 Per FG. Patient not responding to vms or test to schedule appointment-Toni

## 2023-07-13 ENCOUNTER — Encounter: Admitting: Internal Medicine

## 2023-07-21 ENCOUNTER — Ambulatory Visit: Admitting: Physician Assistant

## 2023-07-22 ENCOUNTER — Ambulatory Visit (INDEPENDENT_AMBULATORY_CARE_PROVIDER_SITE_OTHER): Admitting: Family Medicine

## 2023-07-22 ENCOUNTER — Encounter: Payer: Self-pay | Admitting: Family Medicine

## 2023-07-22 VITALS — BP 124/83 | HR 94 | Resp 16 | Wt 186.0 lb

## 2023-07-22 DIAGNOSIS — Z7985 Long-term (current) use of injectable non-insulin antidiabetic drugs: Secondary | ICD-10-CM

## 2023-07-22 DIAGNOSIS — Z800 Family history of malignant neoplasm of digestive organs: Secondary | ICD-10-CM

## 2023-07-22 DIAGNOSIS — E1142 Type 2 diabetes mellitus with diabetic polyneuropathy: Secondary | ICD-10-CM | POA: Diagnosis not present

## 2023-07-22 DIAGNOSIS — Z1211 Encounter for screening for malignant neoplasm of colon: Secondary | ICD-10-CM

## 2023-07-22 DIAGNOSIS — I10 Essential (primary) hypertension: Secondary | ICD-10-CM

## 2023-07-22 DIAGNOSIS — K5904 Chronic idiopathic constipation: Secondary | ICD-10-CM | POA: Diagnosis not present

## 2023-07-22 MED ORDER — TRULANCE 3 MG PO TABS: 3.0000 mg | ORAL_TABLET | Freq: Every day | ORAL | 5 refills | Status: AC

## 2023-07-23 LAB — RENAL FUNCTION PANEL
Albumin: 4 g/dL (ref 3.8–4.9)
BUN/Creatinine Ratio: 20 (ref 9–20)
BUN: 21 mg/dL (ref 6–24)
CO2: 27 mmol/L (ref 20–29)
Calcium: 9.5 mg/dL (ref 8.7–10.2)
Chloride: 93 mmol/L — ABNORMAL LOW (ref 96–106)
Creatinine, Ser: 1.06 mg/dL (ref 0.76–1.27)
Glucose: 184 mg/dL — ABNORMAL HIGH (ref 70–99)
Phosphorus: 3.3 mg/dL (ref 2.8–4.1)
Potassium: 4.8 mmol/L (ref 3.5–5.2)
Sodium: 138 mmol/L (ref 134–144)
eGFR: 84 mL/min/{1.73_m2} (ref >59)

## 2023-07-24 ENCOUNTER — Encounter: Payer: Self-pay | Admitting: Family Medicine

## 2023-07-24 ENCOUNTER — Other Ambulatory Visit: Payer: Self-pay | Admitting: Family Medicine

## 2023-07-24 DIAGNOSIS — I10 Essential (primary) hypertension: Secondary | ICD-10-CM

## 2023-07-24 MED ORDER — SPIRONOLACTONE 25 MG PO TABS
25.0000 mg | ORAL_TABLET | Freq: Two times a day (BID) | ORAL | 1 refills | Status: DC
Start: 2023-07-24 — End: 2023-10-21

## 2023-07-25 ENCOUNTER — Other Ambulatory Visit: Payer: Self-pay | Admitting: Cardiovascular Disease

## 2023-07-29 NOTE — Progress Notes (Signed)
 Established patient visit   Patient: Scott [name]   DOB: Mar 24, 1971   53 y.o. Male  MRN: [id number] Visit Date: 07/22/2023  Today's healthcare provider: Jeralene Mom, MD   Chief Complaint  Patient presents with   Medical Management of Chronic Issues    4 week follow-up   Subjective    Discussed the use of AI scribe software for clinical note transcription with the patient, who gave verbal consent to proceed.  History of Present Illness   He is a 53 year old male who presents for follow up of hypertension, diabetes, and with worsening constipation.   Since his last visit, spironolactone , has been increased to 25 mg twice daily. He is also taking a potassium supplement, two tablets twice a day. His blood pressure is stable, his weight is decreasing  His blood sugar levels have remained stable. He was prescribed Mounjaro at his last visit which he filled but has not started it due to concerns about constipation. He has been experiencing worsening constipation and has been unable to have a satisfactory bowel movement since his last visit. He has been using Metamucil daily and Colace, but these have not been effective recently. He is considering switching from Metamucil to Miralax  for better results, recalling its effectiveness when used in the past during a hospital stay. He reports a history of constipation and diarrhea, which had improved over the past 10 years  He has not had a colonoscopy and acknowledges the importance of having one at age 71.  He also is interested in obtaining orthopedic shoes due to past falls and has a podiatrist who can assist with this.       Medications: Outpatient Medications Prior to Visit  Medication Sig   albuterol  (VENTOLIN  HFA) 108 (90 Base) MCG/ACT inhaler Inhale 2 puffs into the lungs every 4 (four) hours as needed for wheezing or shortness of breath.   ANDROGEL  PUMP 20.25 MG/ACT (1.62%) GEL Apply 1 application  topically daily.   bisoprolol   (ZEBETA ) 5 MG tablet TAKE 1/2 TABLET(2.5 MG) BY MOUTH DAILY   blood glucose meter kit and supplies Use 1-2 times a day as needed.Dispense based on patient and insurance preference. (FOR ICD-10 E10.9, E11.9).   budesonide -formoterol  (SYMBICORT ) 80-4.5 MCG/ACT inhaler Inhale 2 puffs into the lungs 2 (two) times daily.   cabergoline (DOSTINEX) 0.5 MG tablet Take by mouth.   dapagliflozin  propanediol (FARXIGA ) 10 MG TABS tablet Take 1 tablet (10 mg total) by mouth daily.   gabapentin  (NEURONTIN ) 300 MG capsule Take 1 capsule (300 mg total) by mouth 2 (two) times daily.   glucose blood (ONETOUCH VERIO) test strip USE TO CHECK BLOOD SUGAR EVERY DAY   hydrochlorothiazide  (HYDRODIURIL ) 12.5 MG tablet Take 12.5 mg by mouth every morning.   Lancets (ONETOUCH DELICA PLUS LANCET30G) MISC 1 each by Does not apply route daily.   latanoprost  (XALATAN ) 0.005 % ophthalmic solution Place 1 drop into both eyes at bedtime.   ofloxacin (OCUFLOX) 0.3 % ophthalmic solution Place 1 drop into the left eye.   PARoxetine  (PAXIL ) 40 MG tablet Take 1 tablet (40 mg total) by mouth daily.   tirzepatide (MOUNJARO) 2.5 MG/0.5ML Pen Inject 2.5 mg into the skin once a week.   torsemide  (DEMADEX ) 20 MG tablet TAKE 3 TABLETS(60 MG) BY MOUTH DAILY   TRULICITY  1.5 MG/0.5ML SOPN Inject 1.5 mg into the skin once a week.   [DISCONTINUED] potassium chloride  SA (KLOR-CON  M) 20 MEQ tablet TAKE 2 TABLETS(40 MEQ) BY MOUTH TWICE  DAILY   [DISCONTINUED] spironolactone  (ALDACTONE ) 25 MG tablet Take 1 tablet (25 mg total) by mouth 2 (two) times daily.   No facility-administered medications prior to visit.        Objective    BP 124/83 (BP Location: Left Arm, Patient Position: Sitting, Cuff Size: Normal)   Pulse 94   Resp 16   Wt 186 lb (84.4 kg)   SpO2 96%   BMI 34.02 kg/m   Physical Exam  General appearance: Mildly obese male, cooperative and in no acute distress Head: Normocephalic, without obvious abnormality,  atraumatic Respiratory: Respirations even and unlabored, normal respiratory rate Extremities: All extremities are intact.  Skin: Skin color, texture, turgor normal. No rashes seen  Psych: Appropriate mood and affect. Neurologic: Mental status: Alert, oriented to person, place, and time, thought content appropriate.    Assessment & Plan        Hypertension Hypertension well-controlled with increased dose of spironolactone . Requires monitoring of potassium levels and kidney function. - Continue spironolactone  25 mg twice daily. - Monitor potassium levels and kidney function.   Chronic Constipation with family history colon cancer in father Chronic constipation worsening lately.  Current regimen ineffective. Discussed Miralax  for short-term relief and Trulance  for long-term management. Emphasized fiber intake for Trulance  efficacy. - Switch to Miralax  for one week. - Prescribe Trulance  for long-term management. - Continue Metamucil post-Miralax . - Refer to gastroenterologist. - Consider colonoscopy.  Type 2 Diabetes Mellitus Type 2 diabetes well-managed. Mounjaro not yet started due to constipation concerns. Plan to start once bowel movements are regulated. - Initiate Mounjaro once constipation is managed.   Orthopedic Footwear Needs Potential need for orthopedic shoes due to falls. Podiatrist involved. - Consult with podiatrist for orthopedic shoes prescription.          Jeralene Mom, MD  [facility] [phone] (phone) [phone] (fax)  [facility]

## 2023-08-03 ENCOUNTER — Encounter: Admitting: Internal Medicine

## 2023-08-10 ENCOUNTER — Telehealth: Payer: Self-pay | Admitting: Internal Medicine

## 2023-08-10 NOTE — Telephone Encounter (Signed)
 MB full, sent mychart message to confirm 08/17/23 appointment-Toni

## 2023-08-15 ENCOUNTER — Encounter: Payer: Self-pay | Admitting: *Deleted

## 2023-08-17 ENCOUNTER — Encounter: Admitting: Internal Medicine

## 2023-08-18 ENCOUNTER — Ambulatory Visit: Admitting: Physician Assistant

## 2023-08-24 ENCOUNTER — Encounter: Payer: Self-pay | Admitting: Family Medicine

## 2023-08-25 MED ORDER — POTASSIUM CHLORIDE CRYS ER 20 MEQ PO TBCR: EXTENDED_RELEASE_TABLET | ORAL | 3 refills | Status: DC

## 2023-08-31 ENCOUNTER — Ambulatory Visit (INDEPENDENT_AMBULATORY_CARE_PROVIDER_SITE_OTHER): Admitting: Podiatry

## 2023-08-31 ENCOUNTER — Encounter: Payer: Self-pay | Admitting: Podiatry

## 2023-08-31 DIAGNOSIS — B351 Tinea unguium: Secondary | ICD-10-CM

## 2023-08-31 DIAGNOSIS — E1142 Type 2 diabetes mellitus with diabetic polyneuropathy: Secondary | ICD-10-CM

## 2023-08-31 DIAGNOSIS — I7300 Raynaud's syndrome without gangrene: Secondary | ICD-10-CM | POA: Diagnosis not present

## 2023-08-31 DIAGNOSIS — M79609 Pain in unspecified limb: Secondary | ICD-10-CM | POA: Diagnosis not present

## 2023-08-31 NOTE — Progress Notes (Signed)
 He presents today for his bilateral toenails.  He is concerned about the redness and the purple discoloration in the toes and some of the forefoot increase symptomatology and discoloration.  He is also requesting toenail trim.  Objective: Vital signs stable alert oriented x 3 presents with his wife today he has some pulmonary disease and is currently on a oxygen  machine.  He has pulses in bilateral lower extremity obviously some small vasomotor dysfunction or small vessel disease resulting in sweating of the toes with cyanosis and erythema.  That his toes are cool to the touch.  His toenails are long thick yellow dystrophic clinically mycotic no open lesions or wounds.  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  Plan: Discussed etiology pathology conservative surgical therapies debrided toenails 1 through 5 bilaterally.

## 2023-09-07 ENCOUNTER — Ambulatory Visit: Payer: Self-pay

## 2023-09-07 ENCOUNTER — Telehealth: Payer: Self-pay | Admitting: Internal Medicine

## 2023-09-07 NOTE — Telephone Encounter (Signed)
 FYI Only or Action Required?: Action required by provider  Patient was last seen in primary care on 07/22/2023 by Scott Pillow, Scott [name]. Called Nurse Triage reporting Foot Swelling. Symptoms began several months ago. Interventions attempted: Rest, hydration, or home remedies. Symptoms are: gradually worsening.  Triage Disposition: See PCP When Office is Open (Within 3 Days)  Patient/caregiver understands and will follow disposition?: Yes                             Copied from CRM [id number]. Topic: Clinical - Red Word Triage >> Sep 07, 2023  4:19 PM Scott [name] wrote: Red Word that prompted transfer to Nurse Triage: Patient increase in redness in legs, pain in lower legs, swelling in legs and toes, cold to the touch Reason for Disposition  [1] MILD swelling of both ankles (i.e., pedal edema) AND [2] new-onset or worsening  Answer Assessment - Initial Assessment Questions 1. ONSET: "When did the swelling start?" (e.g., minutes, hours, days)     Started months ago, saw podiatry on 08/31/23, gradually worsening 2. LOCATION: "What part of the leg is swollen?"  "Are both legs swollen or just one leg?"     Mostly right foot, left foot is also affected, but not as bad as the right 3. SEVERITY: "How bad is the swelling?" (e.g., localized; mild, moderate, severe)   - Localized: Small area of swelling localized to one leg.   - MILD pedal edema: Swelling limited to foot and ankle, pitting edema < 1/4 inch (6 mm) deep, rest and elevation eliminate most or all swelling.   - MODERATE edema: Swelling of lower leg to knee, pitting edema > 1/4 inch (6 mm) deep, rest and elevation only partially reduce swelling.   - SEVERE edema: Swelling extends above knee, facial or hand swelling present.      Mild 4. REDNESS: "Does the swelling look red or infected?"     Yes 5. PAIN: "Is the swelling painful to touch?" If Yes, ask: "How painful is it?"   (Scale 1-10; mild, moderate or severe)      Denies constant pain, but states he gets a shooting pain that comes and goes 6. FEVER: "Do you have a fever?" If Yes, ask: "What is it, how was it measured, and when did it start?"      Denies 7. CAUSE: "What do you think is causing the leg swelling?"     Circulation issues  8. MEDICAL HISTORY: "Do you have a history of blood clots (e.g., DVT), cancer, heart failure, kidney disease, or liver failure?"     Denies history of blood clots 9. RECURRENT SYMPTOM: "Have you had leg swelling before?" If Yes, ask: "When was the last time?" "What happened that time?"     States this has been an ongoing issue for months 10. OTHER SYMPTOMS: "Do you have any other symptoms?" (e.g., chest pain, difficulty breathing)       States right foot is cold to the touch, states pain makes sleeping difficult, denies chest pain, states he has breathing problems at baseline, but nothing more than baseline, weight gain, denies complete loss of sensation, but states part of his foot lack sensation, states toes appear blue/purple in color, states discoloration was present at podiatry appointment, states discoloration improves when he "massages" foot,  denies discoloration of entire foot  Patient states he saw his podiatrist last week and was told nothing was wrong- patient seeking alternate opinion from  provider  Protocols used: Leg Swelling and Edema-A-AH

## 2023-09-07 NOTE — Telephone Encounter (Signed)
 Left vm to confirm 09/14/23 appointment-Toni

## 2023-09-08 ENCOUNTER — Telehealth: Payer: Self-pay

## 2023-09-08 DIAGNOSIS — L03115 Cellulitis of right lower limb: Secondary | ICD-10-CM

## 2023-09-08 MED ORDER — CEFDINIR 300 MG PO CAPS
300.0000 mg | ORAL_CAPSULE | Freq: Two times a day (BID) | ORAL | 0 refills | Status: AC
Start: 2023-09-08 — End: 2023-09-18

## 2023-09-08 NOTE — Telephone Encounter (Signed)
 Called patient and advised to go to UC. Patient prefers to wait on Dr. Shann Darnel tomorrow to see message.

## 2023-09-08 NOTE — Telephone Encounter (Signed)
 Copied from CRM [id number]. Topic: Appointments - Appointment Scheduling >> Sep 08, 2023  4:04 PM Sophia H wrote: Patient is calling back regarding his appointment request, he is stating he wants orders for an ultrasound but is unsure if he needs to come in for an evaluation first. First available for Dr. Shann Darnel is in July. Please advise, best contact number [phone]   CRM # [phone] NT - redness in legs, pain in lower legs, swelling in legs and toes, cold to the touch

## 2023-09-08 NOTE — Telephone Encounter (Signed)
 He's had ultrasound checked recently to check circulation in legs and was normal.  Have sent prescription for antibiotic since he's had some trouble with infection in the past. Go to ER if pain worsens or not rapidly improving on antibiotic.

## 2023-09-09 ENCOUNTER — Telehealth: Payer: Self-pay | Admitting: Internal Medicine

## 2023-09-09 NOTE — Telephone Encounter (Signed)
 Per FG, patient still not returning call to schedule SS-Toni

## 2023-09-09 NOTE — Telephone Encounter (Signed)
 Then he needs to go to urgent care for evaluation

## 2023-09-09 NOTE — Telephone Encounter (Signed)
 After verifying the pt voicemail, the message per Dr F advising him to be evaluated at [facility] was left, he also informed to contact the office back if he had any other questions or concerns

## 2023-09-14 ENCOUNTER — Encounter: Admitting: Internal Medicine

## 2023-09-15 ENCOUNTER — Ambulatory Visit: Admitting: Physician Assistant

## 2023-09-15 ENCOUNTER — Ambulatory Visit: Payer: 59 | Admitting: Physician Assistant

## 2023-09-15 VITALS — BP 111/73 | Resp 16 | Ht 62.0 in | Wt 185.0 lb

## 2023-09-15 DIAGNOSIS — M79661 Pain in right lower leg: Secondary | ICD-10-CM

## 2023-09-15 DIAGNOSIS — R200 Anesthesia of skin: Secondary | ICD-10-CM

## 2023-09-15 DIAGNOSIS — I5032 Chronic diastolic (congestive) heart failure: Secondary | ICD-10-CM

## 2023-09-15 DIAGNOSIS — Z7984 Long term (current) use of oral hypoglycemic drugs: Secondary | ICD-10-CM

## 2023-09-15 DIAGNOSIS — K5904 Chronic idiopathic constipation: Secondary | ICD-10-CM | POA: Diagnosis not present

## 2023-09-15 DIAGNOSIS — Z7985 Long-term (current) use of injectable non-insulin antidiabetic drugs: Secondary | ICD-10-CM

## 2023-09-15 DIAGNOSIS — L03115 Cellulitis of right lower limb: Secondary | ICD-10-CM

## 2023-09-15 DIAGNOSIS — M7989 Other specified soft tissue disorders: Secondary | ICD-10-CM

## 2023-09-15 DIAGNOSIS — E1169 Type 2 diabetes mellitus with other specified complication: Secondary | ICD-10-CM

## 2023-09-15 DIAGNOSIS — E1142 Type 2 diabetes mellitus with diabetic polyneuropathy: Secondary | ICD-10-CM

## 2023-09-15 DIAGNOSIS — R202 Paresthesia of skin: Secondary | ICD-10-CM

## 2023-09-15 DIAGNOSIS — E785 Hyperlipidemia, unspecified: Secondary | ICD-10-CM

## 2023-09-15 MED ORDER — SULFAMETHOXAZOLE-TRIMETHOPRIM 800-160 MG PO TABS: 1.0000 | ORAL_TABLET | Freq: Two times a day (BID) | ORAL | 0 refills | Status: AC

## 2023-09-15 NOTE — Progress Notes (Unsigned)
 Established patient visit  Patient: Scott [name]   DOB: April 09, 1970   53 y.o. Male  MRN: [id number] Visit Date: 09/15/2023  Today's healthcare provider: Blane Bunting, PA-C   Chief Complaint  Patient presents with   Foot Swelling    Ft swelling ( both) . No other concern Vomiting last night  pt is on an Antibiotic.  Pt complain of redness in both ft with poor circulation.    Subjective     HPI     Foot Swelling    Additional comments: Ft swelling ( both) . No other concern Vomiting last night  pt is on an Antibiotic.  Pt complain of redness in both ft with poor circulation.       Last edited by Estill Hemming, CMA on 09/15/2023  4:19 PM.       Discussed the use of AI scribe software for clinical note transcription with the patient, who gave verbal consent to proceed.  History of Present Illness   Discussed the use of AI scribe software for clinical note transcription with the patient, who gave verbal consent to proceed.  History of Present Illness   Scott [name] is a 53 year old male with heart failure and diabetes who presents with bilateral foot swelling and redness. He is accompanied by his wife, Scott [name].  He experiences swelling and redness in both feet, with the right foot more affected. Numbness and pain are present, particularly in the toes and the top of the foot, ongoing since the beginning of the year. Symptoms initially resolved but have recurred and worsened since January. He was prescribed antibiotics for suspected cellulitis, which he has been taking for two days, causing vomiting. There is some improvement in redness.  He has heart failure and takes Lasix  for fluid retention. He reports a weight gain of approximately five pounds, attributed to increased fluid retention. He experiences shortness of breath, which varies in severity, and notes today is 'not a good breathing day'.  He has diabetes with blood sugar levels of 130-140 mg/dL in the mornings. He takes  Farxiga  and Mounjaro  for diabetes management. He has neuropathy and has chosen not to take gabapentin  for pain management. He has difficulty feeling his toes fully, with diminished sensation over the past month. He experiences more pain when standing still compared to walking.  He is taking multiple medications, including torsemide , spironolactone , albuterol , and medications for depression and blood pressure. He is unsure if aspirin  is among his medications. No chest pain, rapid heart beating, or significant changes in vision.          06/29/2023    9:06 AM 05/24/2023    4:27 PM 09/21/2022   10:56 AM  Depression screen PHQ 2/9  Decreased Interest 0 0 1  Down, Depressed, Hopeless 0 0 0  PHQ - 2 Score 0 0 1  Altered sleeping 0 0 0  Tired, decreased energy 0 0 1  Change in appetite 0 0 0  Feeling bad or failure about yourself  0 1 0  Trouble concentrating 0 0 0  Moving slowly or fidgety/restless 0 0 0  Suicidal thoughts 0 0 0  PHQ-9 Score 0 1 2  Difficult doing work/chores Not difficult at all Somewhat difficult Not difficult at all      05/24/2023    4:27 PM  GAD 7 : Generalized Anxiety Score  Nervous, Anxious, on Edge 2  Control/stop worrying 1  Worry too much - different things 2  Trouble relaxing  3  Restless 0  Easily annoyed or irritable 3  Afraid - awful might happen 3  Total GAD 7 Score 14  Anxiety Difficulty Very difficult    Medications: Outpatient Medications Prior to Visit  Medication Sig   albuterol  (VENTOLIN  HFA) 108 (90 Base) MCG/ACT inhaler Inhale 2 puffs into the lungs every 4 (four) hours as needed for wheezing or shortness of breath.   ANDROGEL  PUMP 20.25 MG/ACT (1.62%) GEL Apply 1 application  topically daily.   bisoprolol  (ZEBETA ) 5 MG tablet TAKE 1/2 TABLET(2.5 MG) BY MOUTH DAILY   blood glucose meter kit and supplies Use 1-2 times a day as needed.Dispense based on patient and insurance preference. (FOR ICD-10 E10.9, E11.9).   budesonide -formoterol   (SYMBICORT ) 80-4.5 MCG/ACT inhaler Inhale 2 puffs into the lungs 2 (two) times daily.   cabergoline (DOSTINEX) 0.5 MG tablet Take by mouth.   cefdinir  (OMNICEF ) 300 MG capsule Take 1 capsule (300 mg total) by mouth 2 (two) times daily for 10 days.   dapagliflozin  propanediol (FARXIGA ) 10 MG TABS tablet Take 1 tablet (10 mg total) by mouth daily.   gabapentin  (NEURONTIN ) 300 MG capsule Take 1 capsule (300 mg total) by mouth 2 (two) times daily.   glucose blood (ONETOUCH VERIO) test strip USE TO CHECK BLOOD SUGAR EVERY DAY   hydrochlorothiazide  (HYDRODIURIL ) 12.5 MG tablet Take 12.5 mg by mouth every morning.   Lancets (ONETOUCH DELICA PLUS LANCET30G) MISC 1 each by Does not apply route daily.   latanoprost  (XALATAN ) 0.005 % ophthalmic solution Place 1 drop into both eyes at bedtime.   ofloxacin (OCUFLOX) 0.3 % ophthalmic solution Place 1 drop into the left eye.   PARoxetine  (PAXIL ) 40 MG tablet Take 1 tablet (40 mg total) by mouth daily.   Plecanatide  (TRULANCE ) 3 MG TABS Take 1 tablet (3 mg total) by mouth daily.   potassium chloride  SA (KLOR-CON  M) 20 MEQ tablet TAKE 2 TABLETS(40 MEQ) BY MOUTH TWICE DAILY   spironolactone  (ALDACTONE ) 25 MG tablet Take 1 tablet (25 mg total) by mouth 2 (two) times daily.   tirzepatide  (MOUNJARO ) 2.5 MG/0.5ML Pen Inject 2.5 mg into the skin once a week.   torsemide  (DEMADEX ) 20 MG tablet TAKE 3 TABLETS(60 MG) BY MOUTH DAILY   TRULICITY  1.5 MG/0.5ML SOPN Inject 1.5 mg into the skin once a week.   No facility-administered medications prior to visit.    Review of Systems All negative Except see HPI   {Insert previous labs (optional):23779} {See past labs  Heme  Chem  Endocrine  Serology  Results Review (optional):1}   Objective    BP 111/73 (BP Location: Right Arm, Patient Position: Sitting, Cuff Size: Normal)   Resp 16   Ht 5' 2 (1.575 m)   Wt 185 lb (83.9 kg)   SpO2 95%   BMI 33.84 kg/m  {Insert last BP/Wt (optional):23777}{See vitals history  (optional):1}   Physical Exam   No results found for any visits on 09/15/23.      Assessment and Plan Assessment & Plan   Assessment and Plan    Bilateral lower extremity cellulitis recurrent Bilateral foot swelling, redness, numbness, and pain suggest cellulitis. Cephalosporin improved redness but caused nausea. Differential includes cellulitis, DVT, and heart failure exacerbation. Heart failure and diabetes complicate presentation. Discussed Doppler ultrasound and vascular surgery referral. Informed consent for antibiotic switch if intolerable. - Continue cephalosporin therapy as pt refused to switch to Bactrim /a new antibiotic - Order Doppler ultrasound of lower extremities. - Consult Dr. Shann Darnel for further management. -  Consider vascular surgery referral if indicated. - Schedule blood work for diabetes control and other parameters.  Heart failure chronic On diuretics for fluid retention. Reports increased fluid retention and weight gain, indicating possible exacerbation. Shortness of breath varies. Discussed monitoring and potential management adjustments. - Continue diuretic therapy (Lasix , torsemide ). - Monitor for heart failure exacerbation. - Consult Dr. Shann Darnel for management adjustments.  Diabetes mellitus with neuropathy chronic Poor glycemic control with morning blood sugars 130-140 mg/dL. Neuropathy present, prefers no gabapentin . Management includes Farxiga  and Mounjaro . Discussed alternative neuropathy management. - Continue Farxiga  and Mounjaro . - Schedule blood work for diabetes control, including A1c. - Consider alternative neuropathy management.  Constipation chronic Reports constipation, uses Metamucil. Recommended high fiber diet and probiotics. Avoid prunes due to diabetes. - Continue Metamucil. - Increase dietary fiber and probiotics. - Avoid prunes.        No orders of the defined types were placed in this encounter.   No follow-ups on file.    The patient was advised to call back or seek an in-person evaluation if the symptoms worsen or if the condition fails to improve as anticipated.  I discussed the assessment and treatment plan with the patient. The patient was provided an opportunity to ask questions and all were answered. The patient agreed with the plan and demonstrated an understanding of the instructions.  I, Raygan Skarda, PA-C have reviewed all documentation for this visit. The documentation on 09/15/2023  for the exam, diagnosis, procedures, and orders are all accurate and complete.  Blane Bunting, [facility], MMS [facility] [phone] (phone) [phone] (fax)  [facility]

## 2023-09-17 LAB — COMPREHENSIVE METABOLIC PANEL WITH GFR
ALT: 11 IU/L (ref 0–44)
AST: 13 IU/L (ref 0–40)
Albumin: 3.8 g/dL (ref 3.8–4.9)
Alkaline Phosphatase: 153 IU/L — ABNORMAL HIGH (ref 44–121)
BUN/Creatinine Ratio: 24 — ABNORMAL HIGH (ref 9–20)
BUN: 23 mg/dL (ref 6–24)
Bilirubin Total: <0.2 mg/dL (ref 0.0–1.2)
CO2: 27 mmol/L (ref 20–29)
Calcium: 8.8 mg/dL (ref 8.7–10.2)
Chloride: 95 mmol/L — ABNORMAL LOW (ref 96–106)
Creatinine, Ser: 0.97 mg/dL (ref 0.76–1.27)
Globulin, Total: 2.6 g/dL (ref 1.5–4.5)
Glucose: 187 mg/dL — ABNORMAL HIGH (ref 70–99)
Potassium: 4.6 mmol/L (ref 3.5–5.2)
Sodium: 141 mmol/L (ref 134–144)
Total Protein: 6.4 g/dL (ref 6.0–8.5)
eGFR: 94 mL/min/{1.73_m2} (ref >59)

## 2023-09-17 LAB — LIPID PANEL
Chol/HDL Ratio: 2.5 ratio (ref 0.0–5.0)
Cholesterol, Total: 181 mg/dL (ref 100–199)
HDL: 72 mg/dL (ref >39)
LDL Chol Calc (NIH): 95 mg/dL (ref 0–99)
Triglycerides: 75 mg/dL (ref 0–149)
VLDL Cholesterol Cal: 14 mg/dL (ref 5–40)

## 2023-09-17 LAB — C-REACTIVE PROTEIN: CRP: 28 mg/L — ABNORMAL HIGH (ref 0–10)

## 2023-09-17 LAB — CBC WITH DIFFERENTIAL/PLATELET
Basophils Absolute: 0 10*3/uL (ref 0.0–0.2)
Basos: 0 %
EOS (ABSOLUTE): 0.1 10*3/uL (ref 0.0–0.4)
Eos: 1 %
Hematocrit: 42.8 % (ref 37.5–51.0)
Hemoglobin: 13.5 g/dL (ref 13.0–17.7)
Immature Grans (Abs): 0 10*3/uL (ref 0.0–0.1)
Immature Granulocytes: 0 %
Lymphocytes Absolute: 1.5 10*3/uL (ref 0.7–3.1)
Lymphs: 17 %
MCH: 29.4 pg (ref 26.6–33.0)
MCHC: 31.5 g/dL (ref 31.5–35.7)
MCV: 93 fL (ref 79–97)
Monocytes Absolute: 0.8 10*3/uL (ref 0.1–0.9)
Monocytes: 9 %
Neutrophils Absolute: 6.6 10*3/uL (ref 1.4–7.0)
Neutrophils: 73 %
Platelets: 316 10*3/uL (ref 150–450)
RBC: 4.59 x10E6/uL (ref 4.14–5.80)
RDW: 12.8 % (ref 11.6–15.4)
WBC: 9.1 10*3/uL (ref 3.4–10.8)

## 2023-09-17 LAB — HEMOGLOBIN A1C
Est. average glucose Bld gHb Est-mCnc: 229 mg/dL
Hgb A1c MFr Bld: 9.6 % — ABNORMAL HIGH (ref 4.8–5.6)

## 2023-09-17 LAB — SEDIMENTATION RATE: Sed Rate: 67 mm/h — ABNORMAL HIGH (ref 0–30)

## 2023-09-19 ENCOUNTER — Ambulatory Visit
Admission: RE | Admit: 2023-09-19 | Discharge: 2023-09-19 | Disposition: A | Discharge status: Home / Self Care | Source: Ambulatory Visit | Attending: Physician Assistant | Admitting: Physician Assistant

## 2023-09-19 ENCOUNTER — Ambulatory Visit: Payer: Self-pay | Admitting: Physician Assistant

## 2023-09-19 ENCOUNTER — Ambulatory Visit: Admission: RE | Admit: 2023-09-19 | Source: Ambulatory Visit

## 2023-09-19 ENCOUNTER — Encounter: Payer: Self-pay | Admitting: Physician Assistant

## 2023-09-19 DIAGNOSIS — M79661 Pain in right lower leg: Secondary | ICD-10-CM | POA: Insufficient documentation

## 2023-09-19 DIAGNOSIS — E1142 Type 2 diabetes mellitus with diabetic polyneuropathy: Secondary | ICD-10-CM

## 2023-09-19 DIAGNOSIS — M7989 Other specified soft tissue disorders: Secondary | ICD-10-CM | POA: Insufficient documentation

## 2023-09-19 DIAGNOSIS — R200 Anesthesia of skin: Secondary | ICD-10-CM

## 2023-09-19 DIAGNOSIS — R202 Paresthesia of skin: Secondary | ICD-10-CM | POA: Insufficient documentation

## 2023-09-19 MED ORDER — IOHEXOL 350 MG/ML SOLN
100.0000 mL | Freq: Once | INTRAVENOUS | Status: AC | PRN
Start: 2023-09-19 — End: 2023-09-19
  Administered 2023-09-19: 100 mL via INTRAVENOUS

## 2023-09-20 NOTE — Telephone Encounter (Signed)
 Patient is asking for results of his ultrasound and lab tests. Please call him back ASAP.

## 2023-09-22 NOTE — Progress Notes (Deleted)
 Established patient visit  Patient: Scott [name]   DOB: 01-28-1971   53 y.o. Male  MRN: [id number] Visit Date: 09/23/2023  Today's healthcare provider: Blane Bunting, PA-C   No chief complaint on file.  Subjective       Discussed the use of AI scribe software for clinical note transcription with the patient, who gave verbal consent to proceed.  History of Present Illness        09/15/2023    4:30 PM 06/29/2023    9:06 AM 05/24/2023    4:27 PM  Depression screen PHQ 2/9  Decreased Interest 1 0 0  Down, Depressed, Hopeless 0 0 0  PHQ - 2 Score 1 0 0  Altered sleeping 0 0 0  Tired, decreased energy 1 0 0  Change in appetite 0 0 0  Feeling bad or failure about yourself  0 0 1  Trouble concentrating 0 0 0  Moving slowly or fidgety/restless 1 0 0  Suicidal thoughts 0 0 0  PHQ-9 Score 3 0 1  Difficult doing work/chores Somewhat difficult Not difficult at all Somewhat difficult      09/15/2023    4:30 PM 05/24/2023    4:27 PM  GAD 7 : Generalized Anxiety Score  Nervous, Anxious, on Edge 1 2  Control/stop worrying 1 1  Worry too much - different things 1 2  Trouble relaxing 2 3  Restless 1 0  Easily annoyed or irritable 1 3  Afraid - awful might happen 1 3  Total GAD 7 Score 8 14  Anxiety Difficulty Not difficult at all Very difficult    Medications: Outpatient Medications Prior to Visit  Medication Sig  . albuterol  (VENTOLIN  HFA) 108 (90 Base) MCG/ACT inhaler Inhale 2 puffs into the lungs every 4 (four) hours as needed for wheezing or shortness of breath.  . ANDROGEL  PUMP 20.25 MG/ACT (1.62%) GEL Apply 1 application  topically daily.  . bisoprolol  (ZEBETA ) 5 MG tablet TAKE 1/2 TABLET(2.5 MG) BY MOUTH DAILY  . blood glucose meter kit and supplies Use 1-2 times a day as needed.Dispense based on patient and insurance preference. (FOR ICD-10 E10.9, E11.9).  . budesonide -formoterol  (SYMBICORT ) 80-4.5 MCG/ACT inhaler Inhale 2 puffs into the lungs 2 (two) times daily.  .  cabergoline (DOSTINEX) 0.5 MG tablet Take by mouth.  . dapagliflozin  propanediol (FARXIGA ) 10 MG TABS tablet Take 1 tablet (10 mg total) by mouth daily.  . gabapentin  (NEURONTIN ) 300 MG capsule Take 1 capsule (300 mg total) by mouth 2 (two) times daily.  Aaron Aas glucose blood (ONETOUCH VERIO) test strip USE TO CHECK BLOOD SUGAR EVERY DAY  . hydrochlorothiazide  (HYDRODIURIL ) 12.5 MG tablet Take 12.5 mg by mouth every morning.  . Lancets (ONETOUCH DELICA PLUS LANCET30G) MISC 1 each by Does not apply route daily.  . latanoprost  (XALATAN ) 0.005 % ophthalmic solution Place 1 drop into both eyes at bedtime.  Aaron Aas ofloxacin (OCUFLOX) 0.3 % ophthalmic solution Place 1 drop into the left eye.  . PARoxetine  (PAXIL ) 40 MG tablet Take 1 tablet (40 mg total) by mouth daily.  . Plecanatide  (TRULANCE ) 3 MG TABS Take 1 tablet (3 mg total) by mouth daily.  . potassium chloride  SA (KLOR-CON  M) 20 MEQ tablet TAKE 2 TABLETS(40 MEQ) BY MOUTH TWICE DAILY  . spironolactone  (ALDACTONE ) 25 MG tablet Take 1 tablet (25 mg total) by mouth 2 (two) times daily.  . sulfamethoxazole -trimethoprim  (BACTRIM  DS) 800-160 MG tablet Take 1 tablet by mouth 2 (two) times daily for 7 days.  Aaron Aas  tirzepatide  (MOUNJARO ) 2.5 MG/0.5ML Pen Inject 2.5 mg into the skin once a week.  . torsemide  (DEMADEX ) 20 MG tablet TAKE 3 TABLETS(60 MG) BY MOUTH DAILY  . TRULICITY  1.5 MG/0.5ML SOPN Inject 1.5 mg into the skin once a week.   No facility-administered medications prior to visit.    Review of Systems  All other systems reviewed and are negative. All negative Except see HPI   {Insert previous labs (optional):23779} {See past labs  Heme  Chem  Endocrine  Serology  Results Review (optional):1}   Objective    There were no vitals taken for this visit. {Insert last BP/Wt (optional):23777}{See vitals history (optional):1}   Physical Exam Vitals reviewed.  Constitutional:      General: He is not in acute distress.    Appearance: Normal  appearance. He is not diaphoretic.  HENT:     Head: Normocephalic and atraumatic.   Eyes:     General: No scleral icterus.    Conjunctiva/sclera: Conjunctivae normal.    Cardiovascular:     Rate and Rhythm: Normal rate and regular rhythm.     Pulses: Normal pulses.     Heart sounds: Normal heart sounds. No murmur heard. Pulmonary:     Effort: Pulmonary effort is normal. No respiratory distress.     Breath sounds: Normal breath sounds. No wheezing or rhonchi.   Musculoskeletal:     Cervical back: Neck supple.     Right lower leg: No edema.     Left lower leg: No edema.  Lymphadenopathy:     Cervical: No cervical adenopathy.   Skin:    General: Skin is warm and dry.     Findings: No rash.   Neurological:     Mental Status: He is alert and oriented to person, place, and time. Mental status is at baseline.   Psychiatric:        Mood and Affect: Mood normal.        Behavior: Behavior normal.     No results found for any visits on 09/23/23.      Assessment and Plan Assessment & Plan     No orders of the defined types were placed in this encounter.   No follow-ups on file.   The patient was advised to call back or seek an in-person evaluation if the symptoms worsen or if the condition fails to improve as anticipated.  I discussed the assessment and treatment plan with the patient. The patient was provided an opportunity to ask questions and all were answered. The patient agreed with the plan and demonstrated an understanding of the instructions.  I, Cloyde Oregel, PA-C have reviewed all documentation for this visit. The documentation on 09/23/2023  for the exam, diagnosis, procedures, and orders are all accurate and complete.  Blane Bunting, [facility], MMS [facility] [phone] (phone) [phone] (fax)  [facility]

## 2023-09-23 ENCOUNTER — Ambulatory Visit: Admitting: Physician Assistant

## 2023-09-23 ENCOUNTER — Other Ambulatory Visit: Payer: Self-pay | Admitting: Physician Assistant

## 2023-09-23 ENCOUNTER — Ambulatory Visit: Admitting: Family Medicine

## 2023-09-26 ENCOUNTER — Ambulatory Visit: Admitting: Physician Assistant

## 2023-09-26 MED ORDER — TRULICITY 1.5 MG/0.5ML ~~LOC~~ SOAJ
1.5000 mg | SUBCUTANEOUS | 3 refills | Status: DC
Start: 2023-09-26 — End: 2023-06-20

## 2023-09-30 ENCOUNTER — Ambulatory Visit: Admitting: Family Medicine

## 2023-10-10 ENCOUNTER — Ambulatory Visit: Admitting: Family Medicine

## 2023-10-12 ENCOUNTER — Other Ambulatory Visit: Payer: Self-pay | Admitting: Physician Assistant

## 2023-10-12 ENCOUNTER — Encounter: Admitting: Internal Medicine

## 2023-10-12 DIAGNOSIS — E278 Other specified disorders of adrenal gland: Secondary | ICD-10-CM

## 2023-10-19 ENCOUNTER — Other Ambulatory Visit: Payer: Self-pay | Admitting: Family Medicine

## 2023-10-19 DIAGNOSIS — F418 Other specified anxiety disorders: Secondary | ICD-10-CM

## 2023-10-19 NOTE — Telephone Encounter (Unsigned)
 Copied from CRM [id number]. Topic: Clinical - Medication Refill >> Oct 19, 2023  3:03 PM Wess RAMAN wrote: Medication: PARoxetine  (PAXIL ) 40 MG tablet   Has the patient contacted their pharmacy? Yes (Agent: If no, request that the patient contact the pharmacy for the refill. If patient does not wish to contact the pharmacy document the reason why and proceed with request.) (Agent: If yes, when and what did the pharmacy advise?) Unable to reach  This is the patient's preferred pharmacy:  [id number] [place], [place] - [address] AT [facility] OF [address] [place] [address] [place] [address] Phone: [phone] Fax: [phone]  Is this the correct pharmacy for this prescription? Yes If no, delete pharmacy and type the correct one.   Has the prescription been filled recently? Yes  Is the patient out of the medication? Yes  Has the patient been seen for an appointment in the last year OR does the patient have an upcoming appointment? Yes  Can we respond through MyChart? Yes  Agent: Please be advised that Rx refills may take up to 3 business days. We ask that you follow-up with your pharmacy.

## 2023-10-20 MED ORDER — PAROXETINE HCL 40 MG PO TABS: 40.0000 mg | ORAL_TABLET | Freq: Every day | ORAL | 0 refills | Status: DC

## 2023-10-20 NOTE — Telephone Encounter (Signed)
 Requested Prescriptions  Pending Prescriptions Disp Refills   PARoxetine  (PAXIL ) 40 MG tablet 90 tablet 0    Sig: Take 1 tablet (40 mg total) by mouth daily.     Psychiatry:  Antidepressants - SSRI Passed - 10/20/2023  5:46 PM      Passed - Completed PHQ-2 or PHQ-9 in the last 360 days      Passed - Valid encounter within last 6 months    Recent Outpatient Visits           1 month ago Cellulitis of right lower extremity   [place] [facility] [place], Delia, PA-C   3 months ago Primary hypertension   [facility] [place] Nancyann BRAVO, MD   4 months ago Diabetic polyneuropathy associated with type 2 diabetes mellitus [facility])   [place] [facility] Gas[place] Nancyann BRAVO, MD   4 months ago Cellulitis of right lower extremity   [place] [facility] Sharma Coyer, MD

## 2023-10-21 ENCOUNTER — Encounter: Payer: Self-pay | Admitting: Family Medicine

## 2023-10-21 ENCOUNTER — Ambulatory Visit (INDEPENDENT_AMBULATORY_CARE_PROVIDER_SITE_OTHER): Admitting: Family Medicine

## 2023-10-21 VITALS — BP 116/69 | HR 102 | Resp 20 | Wt 195.0 lb

## 2023-10-21 DIAGNOSIS — I10 Essential (primary) hypertension: Secondary | ICD-10-CM

## 2023-10-21 DIAGNOSIS — E1142 Type 2 diabetes mellitus with diabetic polyneuropathy: Secondary | ICD-10-CM | POA: Diagnosis not present

## 2023-10-21 DIAGNOSIS — Z7985 Long-term (current) use of injectable non-insulin antidiabetic drugs: Secondary | ICD-10-CM

## 2023-10-21 DIAGNOSIS — J449 Chronic obstructive pulmonary disease, unspecified: Secondary | ICD-10-CM | POA: Diagnosis not present

## 2023-10-21 DIAGNOSIS — M7989 Other specified soft tissue disorders: Secondary | ICD-10-CM | POA: Diagnosis not present

## 2023-10-21 DIAGNOSIS — M79661 Pain in right lower leg: Secondary | ICD-10-CM

## 2023-10-21 MED ORDER — SPIRONOLACTONE 25 MG PO TABS: 25.0000 mg | ORAL_TABLET | Freq: Two times a day (BID) | ORAL | 1 refills | Status: DC

## 2023-10-21 NOTE — Progress Notes (Signed)
 Established patient visit   Patient: Scott [name]   DOB: March 11, 1971   53 y.o. Male  MRN: [id number] Visit Date: 10/21/2023  Today's healthcare provider: Nancyann Perry, MD   Chief Complaint  Patient presents with   Edema    Issues with right foot and toes.  Symptoms: swelling, redness, burning, pain and numbness.   Subjective    HPI Discussed the use of AI scribe software for clinical note transcription with the patient, who gave verbal consent to proceed.  History of Present Illness   Scott [name] is a 53 year old male with venous insufficiency who presents with persistent leg pain and redness.  He has persistent redness and pain in his legs, which have not improved despite completing two rounds of antibiotics. The affected area remains red and painful, with no significant change in symptoms. He has been trying to keep his shoe off and elevate his leg as much as possible at home, although he notes that he needs to keep it moving even when elevated. He mentions a history of scoliosis surgery where a bone or muscle was taken from his leg, which may be related to a scar present on the leg. He recently had normal LE CTA and venous doppler ultrasounds of LEs. He has had similar episodes on several occasions over the last few years.   He has a history of breathing difficulties and uses oxygen  at home. He has not taken his Symbicort  inhaler recently and is unsure if he has any left. He has an upcoming appointment with a pulmonary specialist in [place]. He experiences significant anxiety related to his breathing issues, which has led to him avoiding leaving the house due to fear of not being able to breathe. He describes episodes of panic and difficulty breathing, particularly when preparing to leave the house.  He is currently taking paroxetine  40 mg for anxiety and reports having recently refilled this medication. He also mentions issues with constipation, which have affected his ability  to take Trulicity  for diabetes management. He has not started Mounjaro , which is noted on his medication list, and continues to take Farxiga . He is almost out of spironolactone  25 mg, which he takes twice daily, and requests a refill. He wants to manage his diabetes more effectively due to frustration with high blood sugar levels.     Lab Results  Component Value Date   NA 141 09/16/2023   K 4.6 09/16/2023   CREATININE 0.97 09/16/2023   EGFR 94 09/16/2023   GLUCOSE 187 (H) 09/16/2023   Lab Results  Component Value Date   HGBA1C 9.6 (H) 09/16/2023   HGBA1C 8.7 (H) 05/24/2023   HGBA1C 7.5 (H) 09/02/2022   Lab Results  Component Value Date   CHOL 181 09/16/2023   HDL 72 09/16/2023   LDLCALC 95 09/16/2023   TRIG 75 09/16/2023   CHOLHDL 2.5 09/16/2023     Medications: Outpatient Medications Prior to Visit  Medication Sig Note   albuterol  (VENTOLIN  HFA) 108 (90 Base) MCG/ACT inhaler Inhale 2 puffs into the lungs every 4 (four) hours as needed for wheezing or shortness of breath.    ANDROGEL  PUMP 20.25 MG/ACT (1.62%) GEL Apply 1 application  topically daily.    bisoprolol  (ZEBETA ) 5 MG tablet TAKE 1/2 TABLET(2.5 MG) BY MOUTH DAILY    blood glucose meter kit and supplies Use 1-2 times a day as needed.Dispense based on patient and insurance preference. (FOR ICD-10 E10.9, E11.9).    budesonide -formoterol  (  SYMBICORT ) 80-4.5 MCG/ACT inhaler Inhale 2 puffs into the lungs 2 (two) times daily. (Patient not taking: Reported on 10/21/2023)    cabergoline (DOSTINEX) 0.5 MG tablet Take by mouth.    dapagliflozin  propanediol (FARXIGA ) 10 MG TABS tablet Take 1 tablet (10 mg total) by mouth daily.    gabapentin  (NEURONTIN ) 300 MG capsule Take 1 capsule (300 mg total) by mouth 2 (two) times daily.    glucose blood (ONETOUCH VERIO) test strip USE TO CHECK BLOOD SUGAR EVERY DAY    hydrochlorothiazide  (HYDRODIURIL ) 12.5 MG tablet Take 12.5 mg by mouth every morning.    Lancets (ONETOUCH DELICA PLUS  LANCET30G) MISC 1 each by Does not apply route daily.    latanoprost  (XALATAN ) 0.005 % ophthalmic solution Place 1 drop into both eyes at bedtime.    PARoxetine  (PAXIL ) 40 MG tablet Take 1 tablet (40 mg total) by mouth daily.    Plecanatide  (TRULANCE ) 3 MG TABS Take 1 tablet (3 mg total) by mouth daily.    potassium chloride  SA (KLOR-CON  M) 20 MEQ tablet TAKE 2 TABLETS(40 MEQ) BY MOUTH TWICE DAILY    tirzepatide  (MOUNJARO ) 2.5 MG/0.5ML Pen Inject 2.5 mg into the skin once a week.    torsemide  (DEMADEX ) 20 MG tablet TAKE 3 TABLETS(60 MG) BY MOUTH DAILY    [DISCONTINUED] ofloxacin (OCUFLOX) 0.3 % ophthalmic solution Place 1 drop into the left eye.    [DISCONTINUED] spironolactone  (ALDACTONE ) 25 MG tablet Take 1 tablet (25 mg total) by mouth 2 (two) times daily.    [DISCONTINUED] TRULICITY  1.5 MG/0.5ML SOAJ Inject 1.5 mg into the skin once a week. 10/21/2023: constipation   No facility-administered medications prior to visit.         Objective    BP 116/69 (BP Location: Right Arm, Patient Position: Sitting, Cuff Size: Normal)   Pulse (!) 102   Resp 20   Wt 195 lb (88.5 kg)   SpO2 96% Comment: 3 L oxygen   BMI 35.67 kg/m    Physical Exam     Assessment & Plan     1. Pain and swelling of right lower leg (Primary) Recurrent debilitating symptoms intermittent over last few years. Normal CT angiogram and deep venous dopplers. Not typical for infection and does not respond to multiple courses of antibiotic.   Suspect small vessel disease. - Ambulatory referral to Vascular Surgery  2. Chronic obstructive pulmonary disease, unspecified COPD type (HCC) Expressed importance of always wearing oxygen , even when leaving home. He was previously on Symbicort  and apparently ran out several months. Declined refill today, but does have appointment to establish with local pulmonologist in a couple of weeks.   3. Primary hypertension refill - spironolactone  (ALDACTONE ) 25 MG tablet; Take 1 tablet  (25 mg total) by mouth 2 (two) times daily.  Dispense: 180 tablet; Refill: 1  4. Diabetic polyneuropathy associated with type 2 diabetes mellitus (HCC) Has not yet started tirzepatide  as he has trouble with constipation on Ozempic. He is going to go ahead and try it and let me know if any problems.    Return in about 2 months (around 12/22/2023).         Nancyann Perry, MD  [facility] [phone] (phone) [phone] (fax)  [facility]

## 2023-10-21 NOTE — Patient Instructions (Signed)
 Scott [name]  Please review the attached list of medications and notify my office if there are any errors.   . Please bring all of your medications to every appointment so we can make sure that our medication list is the same as yours.

## 2023-10-25 ENCOUNTER — Ambulatory Visit: Admitting: Internal Medicine

## 2023-10-26 ENCOUNTER — Ambulatory Visit: Admission: RE | Admit: 2023-10-26 | Source: Ambulatory Visit

## 2023-10-27 ENCOUNTER — Ambulatory Visit: Admission: RE | Admit: 2023-10-27 | Source: Ambulatory Visit

## 2023-10-27 ENCOUNTER — Ambulatory Visit: Admitting: Physician Assistant

## 2023-10-28 ENCOUNTER — Ambulatory Visit
Admission: RE | Admit: 2023-10-28 | Discharge: 2023-10-28 | Disposition: A | Discharge status: Home / Self Care | Source: Ambulatory Visit | Attending: Physician Assistant | Admitting: Physician Assistant

## 2023-10-28 DIAGNOSIS — E278 Other specified disorders of adrenal gland: Secondary | ICD-10-CM | POA: Insufficient documentation

## 2023-10-28 MED ORDER — GADOBUTROL 1 MMOL/ML IV SOLN
8.0000 mL | Freq: Once | INTRAVENOUS | Status: AC | PRN
  Administered 2023-10-28: 7.5 mL via INTRAVENOUS

## 2023-10-29 ENCOUNTER — Ambulatory Visit: Payer: Self-pay | Admitting: Physician Assistant

## 2023-10-29 DIAGNOSIS — D3500 Benign neoplasm of unspecified adrenal gland: Secondary | ICD-10-CM

## 2023-10-29 DIAGNOSIS — N63 Unspecified lump in unspecified breast: Secondary | ICD-10-CM

## 2023-10-31 ENCOUNTER — Other Ambulatory Visit: Payer: Self-pay | Admitting: Physician Assistant

## 2023-10-31 DIAGNOSIS — N63 Unspecified lump in unspecified breast: Secondary | ICD-10-CM

## 2023-11-02 ENCOUNTER — Telehealth: Payer: Self-pay | Admitting: Internal Medicine

## 2023-11-02 NOTE — Telephone Encounter (Signed)
 Left vm and sent mychart message to confirm 11/09/23 appointment-Toni

## 2023-11-09 ENCOUNTER — Encounter: Admitting: Internal Medicine

## 2023-11-15 ENCOUNTER — Inpatient Hospital Stay: Admission: RE | Admit: 2023-11-15 | Source: Ambulatory Visit

## 2023-11-15 ENCOUNTER — Encounter

## 2023-11-25 ENCOUNTER — Ambulatory Visit: Admitting: Family Medicine

## 2023-11-30 ENCOUNTER — Other Ambulatory Visit: Payer: Self-pay | Admitting: Family Medicine

## 2023-11-30 DIAGNOSIS — E1142 Type 2 diabetes mellitus with diabetic polyneuropathy: Secondary | ICD-10-CM

## 2023-12-01 ENCOUNTER — Other Ambulatory Visit: Payer: Self-pay | Admitting: Emergency Medicine

## 2023-12-01 DIAGNOSIS — R609 Edema, unspecified: Secondary | ICD-10-CM

## 2023-12-15 ENCOUNTER — Other Ambulatory Visit (INDEPENDENT_AMBULATORY_CARE_PROVIDER_SITE_OTHER): Payer: Self-pay | Admitting: Nurse Practitioner

## 2023-12-15 DIAGNOSIS — M79661 Pain in right lower leg: Secondary | ICD-10-CM

## 2023-12-21 ENCOUNTER — Ambulatory Visit (INDEPENDENT_AMBULATORY_CARE_PROVIDER_SITE_OTHER): Admitting: Nurse Practitioner

## 2023-12-21 ENCOUNTER — Encounter (INDEPENDENT_AMBULATORY_CARE_PROVIDER_SITE_OTHER): Payer: Self-pay | Admitting: Nurse Practitioner

## 2023-12-21 ENCOUNTER — Other Ambulatory Visit (INDEPENDENT_AMBULATORY_CARE_PROVIDER_SITE_OTHER)

## 2023-12-21 VITALS — BP 119/75 | HR 80 | Ht 62.0 in | Wt 195.0 lb

## 2023-12-21 DIAGNOSIS — I5032 Chronic diastolic (congestive) heart failure: Secondary | ICD-10-CM | POA: Diagnosis not present

## 2023-12-21 DIAGNOSIS — I10 Essential (primary) hypertension: Secondary | ICD-10-CM

## 2023-12-21 DIAGNOSIS — M79661 Pain in right lower leg: Secondary | ICD-10-CM

## 2023-12-21 DIAGNOSIS — E1142 Type 2 diabetes mellitus with diabetic polyneuropathy: Secondary | ICD-10-CM

## 2023-12-21 DIAGNOSIS — I890 Lymphedema, not elsewhere classified: Secondary | ICD-10-CM

## 2023-12-21 DIAGNOSIS — M7989 Other specified soft tissue disorders: Secondary | ICD-10-CM | POA: Diagnosis not present

## 2023-12-28 ENCOUNTER — Ambulatory Visit: Admitting: Family Medicine

## 2023-12-28 ENCOUNTER — Other Ambulatory Visit: Payer: Self-pay | Admitting: Family Medicine

## 2023-12-28 ENCOUNTER — Ambulatory Visit: Payer: Self-pay

## 2023-12-28 NOTE — Telephone Encounter (Unsigned)
 Copied from CRM #[id number]. Topic: Clinical - Medication Refill >> Dec 28, 2023  4:24 PM Jasmin G wrote: Medication: potassium chloride  SA (KLOR-CON  M) 20 MEQ tablet  Has the patient contacted their pharmacy? Yes (Agent: If no, request that the patient contact the pharmacy for the refill. If patient does not wish to contact the pharmacy document the reason why and proceed with request.) (Agent: If yes, when and what did the pharmacy advise?)  This is the patient's preferred pharmacy:  [id number] [place], [place] - [address] AT [facility] OF [address] [place] [address] [place] [address] Phone: [phone] Fax: [phone]  Is this the correct pharmacy for this prescription? Yes If no, delete pharmacy and type the correct one.   Has the prescription been filled recently? No  Is the patient out of the medication? Yes  Has the patient been seen for an appointment in the last year OR does the patient have an upcoming appointment? Yes  Can we respond through MyChart? Yes  Agent: Please be advised that Rx refills may take up to 3 business days. We ask that you follow-up with your pharmacy.

## 2023-12-28 NOTE — Telephone Encounter (Signed)
 FYI Only or Action Required?: Action required by provider: update on patient condition. Rescheduled visit for today.   Patient was last seen in primary care on 10/21/2023 by Scott Nancyann BRAVO, MD.  Called Nurse Triage reporting Foot Swelling.  Symptoms began several months ago.  Interventions attempted: Rest, hydration, or home remedies.  Symptoms are: unchanged.  Triage Disposition: See PCP Within 2 Weeks  Patient/caregiver understands and will follow disposition?:   Copied from CRM #[id number]. Topic: Clinical - Red Word Triage >> Dec 28, 2023 12:24 PM Yolanda T wrote: Red Word that prompted transfer to Nurse Triage: patient called to have appt changed to televist as he is having swelling in his feet and exhaustion from no sleep for a while Reason for Disposition  Foot pain is a chronic symptom (recurrent or ongoing AND present > 4 weeks)  [1] MILD swelling of both ankles (i.e., pedal edema) AND [2] is a chronic symptom (recurrent or ongoing AND present > 4 weeks)  Answer Assessment - Initial Assessment Questions Patient called to get his appointment today changed to a video visit due to pain to his feet and swelling along with exhaustion. Call placed to [facility] but office is closed for lunch. Discussed with patient rescheduling his office visit for another day which he agreed with. Patient is transferred back to agent to be rescheduled.   1. ONSET: When did the pain start?      Been going on for a period of time 2. LOCATION: Where is the pain located?      Both feet 3. PAIN: How bad is the pain?    (Scale 1-10; or mild, moderate, severe)     2-3 out of 10 4. WORK OR EXERCISE: Has there been any recent work or exercise that involved this part of the body?      no 5. CAUSE: What do you think is causing the foot pain?     Chronic pain 6. OTHER SYMPTOMS: Do you have any other symptoms? (e.g., leg pain, rash, fever, numbness)     Foot swelling  Answer Assessment - Initial  Assessment Questions 1. ONSET: When did the swelling start? (e.g., minutes, hours, days)     Chronic swelling 2. LOCATION: What part of the leg is swollen?  Are both legs swollen or just one leg?     Up to the calf of both legs 3. SEVERITY: How bad is the swelling? (e.g., localized; mild, moderate, severe)     mild 4. REDNESS: Is there redness or signs of infection?     redness 5. PAIN: Is the swelling painful to touch? If Yes, ask: How painful is it?   (Scale 1-10; mild, moderate or severe)     2-3 out of 10 6. FEVER: Do you have a fever? If Yes, ask: What is it, how was it measured, and when did it start?      no 7. CAUSE: What do you think is causing the leg swelling?     Being treated for lymphedema 8. MEDICAL HISTORY: Do you have a history of blood clots (e.g., DVT), cancer, heart failure, kidney disease, or liver failure?     Heart failure 9. RECURRENT SYMPTOM: Have you had leg swelling before? If Yes, ask: When was the last time? What happened that time?     yes 10. OTHER SYMPTOMS: Do you have any other symptoms? (e.g., chest pain, difficulty breathing)       no  Protocols used: Foot Pain-A-AH, Leg Swelling and Edema-A-AH

## 2023-12-30 MED ORDER — POTASSIUM CHLORIDE CRYS ER 20 MEQ PO TBCR: EXTENDED_RELEASE_TABLET | ORAL | 5 refills | Status: AC

## 2023-12-30 NOTE — Telephone Encounter (Signed)
 Requested Prescriptions  Pending Prescriptions Disp Refills   potassium chloride  SA (KLOR-CON  M) 20 MEQ tablet 120 tablet 5    Sig: TAKE 2 TABLETS(40 MEQ) BY MOUTH TWICE DAILY     Endocrinology:  Minerals - Potassium Supplementation Passed - 12/30/2023 10:06 AM      Passed - K in normal range and within 360 days    Potassium  Date Value Ref Range Status  09/16/2023 4.6 3.5 - 5.2 mmol/L Final         Passed - Cr in normal range and within 360 days    Creatinine, Ser  Date Value Ref Range Status  09/16/2023 0.97 0.76 - 1.27 mg/dL Final         Passed - Valid encounter within last 12 months    Recent Outpatient Visits           2 months ago Pain and swelling of right lower leg   [facility] [place] Nancyann BRAVO, MD   3 months ago Cellulitis of right lower extremity   [name] [place], [place], PA-C   5 months ago Primary hypertension   [facility] Nancyann BRAVO, MD   6 months ago Diabetic polyneuropathy associated with type 2 diabetes mellitus [facility])   [facility] Nancyann BRAVO, MD   7 months ago Cellulitis of right lower extremity   [facility] Sharma Coyer, MD

## 2024-01-01 ENCOUNTER — Encounter (INDEPENDENT_AMBULATORY_CARE_PROVIDER_SITE_OTHER): Payer: Self-pay | Admitting: Nurse Practitioner

## 2024-01-01 NOTE — Progress Notes (Incomplete)
 Subjective:    Patient ID: Scott [name], male    DOB: 08/01/1970, 53 y.o.   MRN: [id number] Chief Complaint  Patient presents with  . New Patient (Initial Visit)    np. LE Reflux + Consult Pain and swelling of right lower leg. [name]     HPI  Review of Systems     Objective:   Physical Exam  BP 119/75   Pulse 80   Ht 5' 2 (1.575 m)   Wt 195 lb (88.5 kg)   BMI 35.67 kg/m   Past Medical History:  Diagnosis Date  . Acid reflux   . Acute renal failure superimposed on stage 2 chronic kidney disease 12/30/2019  . Anxiety   . Arthritis   . Asthma   . Chronic heart failure with preserved ejection fraction (HFpEF) (HCC)    a. 02/2020 Echo: EF 60-65%, no rwma, nl RV fxn.  . Contact dermatitis due to Genus Toxicodendron 09/11/2014  . COPD (chronic obstructive pulmonary disease) (HCC)   . Depression   . Dysfunction of eustachian tube 09/11/2014  . Enteritis presumed infectious 06/11/2008  . Fall from slipping on ice 05/19/2020  . Glaucoma   . Groin strain 09/11/2014  . History of stress test    a. 02/2018 MV: No ischemia/infarct. EF 35% (GI uptake - >55% by echo).  Low risk study.  . Hyperlipidemia   . Hypertension   . Klippel-Feil syndrome     Social History   Socioeconomic History  . Marital status: Significant Other    Spouse name: Not on file  . Number of children: 0  . Years of education: Not on file  . Highest education level: Bachelor's degree (e.g., BA, AB, BS)  Occupational History  . Occupation: disability  Tobacco Use  . Smoking status: Former    Current packs/day: 0.00    Average packs/day: 1 pack/day for 10.0 years (10.0 ttl pk-yrs)    Types: Cigarettes    Start date: 05/1996    Quit date: 05/2006    Years since quitting: 17.6  . Smokeless tobacco: Never  . Tobacco comments:    quit 05/2006  Vaping Use  . Vaping status: Former  . Devices: tried once  Substance and Sexual Activity  . Alcohol  use: No    Alcohol /week: 0.0 standard  drinks of alcohol   . Drug use: No  . Sexual activity: Not on file  Other Topics Concern  . Not on file  Social History Narrative  . Not on file   Social Drivers of Health   Financial Resource Strain: Low Risk  (06/29/2023)   Overall Financial Resource Strain (CARDIA)   . Difficulty of Paying Living Expenses: Not hard at all  Recent Concern: Financial Resource Strain - High Risk (05/02/2023)   Overall Financial Resource Strain (CARDIA)   . Difficulty of Paying Living Expenses: Very hard  Food Insecurity: No Food Insecurity (06/29/2023)   Hunger Vital Sign   . Worried About Programme researcher, broadcasting/film/video in the Last Year: Never true   . Ran Out of Food in the Last Year: Never true  Recent Concern: Food Insecurity - Food Insecurity Present (05/02/2023)   Hunger Vital Sign   . Worried About Programme researcher, broadcasting/film/video in the Last Year: Often true   . Ran Out of Food in the Last Year: Sometimes true  Transportation Needs: No Transportation Needs (06/29/2023)   PRAPARE - Transportation   . Lack of Transportation (Medical): No   . Lack of  Transportation (Non-Medical): No  Physical Activity: Inactive (06/29/2023)   Exercise Vital Sign   . Days of Exercise per Week: 0 days   . Minutes of Exercise per Session: 0 min  Stress: Stress Concern Present (06/29/2023)   [employer] of Occupational Health - Occupational Stress Questionnaire   . Feeling of Stress : To some extent  Social Connections: Moderately Isolated (06/29/2023)   Social Connection and Isolation Panel   . Frequency of Communication with Friends and Family: More than three times a week   . Frequency of Social Gatherings with Friends and Family: Once a week   . Attends Religious Services: Never   . Active Member of Clubs or Organizations: No   . Attends Banker Meetings: Never   . Marital Status: Living with partner  Intimate Partner Violence: Not At Risk (06/29/2023)   Humiliation, Afraid, Rape, and Kick questionnaire   . Fear of  Current or Ex-Partner: No   . Emotionally Abused: No   . Physically Abused: No   . Sexually Abused: No    Past Surgical History:  Procedure Laterality Date  . COLOSTOMY    . DENVER SHUNT PLACEMENT      Family History  Problem Relation Age of Onset  . Multiple sclerosis Mother   . Colon cancer Father   . Heart disease Father   . Diabetes Sister   . Lung cancer Maternal Grandmother   . Thyroid  cancer Maternal Grandfather   . Ovarian cancer Paternal Grandmother   . Cancer Paternal Grandfather     Allergies  Allergen Reactions  . Codeine Shortness Of Breath       Latest Ref Rng & Units 09/16/2023    9:00 AM 05/24/2023    2:54 PM 09/02/2022    2:02 PM  CBC  WBC 3.4 - 10.8 x10E3/uL 9.1  10.6  10.6   Hemoglobin 13.0 - 17.7 g/dL 86.4  85.1  85.4   Hematocrit 37.5 - 51.0 % 42.8  45.7  44.7   Platelets 150 - 450 x10E3/uL 316  360  350       CMP     Component Value Date/Time   NA 141 09/16/2023 0900   K 4.6 09/16/2023 0900   CL 95 (L) 09/16/2023 0900   CO2 27 09/16/2023 0900   GLUCOSE 187 (H) 09/16/2023 0900   GLUCOSE 145 (H) 07/01/2020 1318   BUN 23 09/16/2023 0900   CREATININE 0.97 09/16/2023 0900   CALCIUM  8.8 09/16/2023 0900   PROT 6.4 09/16/2023 0900   ALBUMIN 3.8 09/16/2023 0900   AST 13 09/16/2023 0900   ALT 11 09/16/2023 0900   ALKPHOS 153 (H) 09/16/2023 0900   BILITOT <0.2 09/16/2023 0900   EGFR 94 09/16/2023 0900   GFRNONAA >60 07/01/2020 1318     No results found.     Assessment & Plan:   1. Lymphedema (Primary) ***  2. Diabetic polyneuropathy associated with type 2 diabetes mellitus (HCC) ***  3. Chronic diastolic heart failure (HCC) ***  4. Primary hypertension ***   Current Outpatient Medications on File Prior to Visit  Medication Sig Dispense Refill  . albuterol  (VENTOLIN  HFA) 108 (90 Base) MCG/ACT inhaler Inhale 2 puffs into the lungs every 4 (four) hours as needed for wheezing or shortness of breath. 1 each 1  . blood glucose  meter kit and supplies Use 1-2 times a day as needed.Dispense based on patient and insurance preference. (FOR ICD-10 E10.9, E11.9). 1 each 0  . cabergoline (DOSTINEX) 0.5  MG tablet Take by mouth.    . FARXIGA  10 MG TABS tablet TAKE 1 TABLET(10 MG) BY MOUTH DAILY 90 tablet 3  . gabapentin  (NEURONTIN ) 300 MG capsule Take 1 capsule (300 mg total) by mouth 2 (two) times daily. 60 capsule 3  . glucose blood (ONETOUCH VERIO) test strip USE TO CHECK BLOOD SUGAR EVERY DAY 100 strip 0  . Lancets (ONETOUCH DELICA PLUS LANCET30G) MISC 1 each by Does not apply route daily. 100 each 0  . latanoprost  (XALATAN ) 0.005 % ophthalmic solution Place 1 drop into both eyes at bedtime.    . PARoxetine  (PAXIL ) 40 MG tablet Take 1 tablet (40 mg total) by mouth daily. 90 tablet 0  . Plecanatide  (TRULANCE ) 3 MG TABS Take 1 tablet (3 mg total) by mouth daily. 30 tablet 5  . spironolactone  (ALDACTONE ) 25 MG tablet Take 1 tablet (25 mg total) by mouth 2 (two) times daily. 180 tablet 1  . tirzepatide  (MOUNJARO ) 2.5 MG/0.5ML Pen Inject 2.5 mg into the skin once a week. 2 mL 0  . torsemide  (DEMADEX ) 20 MG tablet TAKE 3 TABLETS(60 MG) BY MOUTH DAILY 270 tablet 0  . ANDROGEL  PUMP 20.25 MG/ACT (1.62%) GEL Apply 1 application  topically daily. (Patient not taking: Reported on 12/21/2023)    . bisoprolol  (ZEBETA ) 5 MG tablet TAKE 1/2 TABLET(2.5 MG) BY MOUTH DAILY 45 tablet 3  . budesonide -formoterol  (SYMBICORT ) 80-4.5 MCG/ACT inhaler Inhale 2 puffs into the lungs 2 (two) times daily. (Patient not taking: Reported on 12/21/2023) 10.2 g 12  . hydrochlorothiazide  (HYDRODIURIL ) 12.5 MG tablet Take 12.5 mg by mouth every morning. (Patient not taking: Reported on 12/21/2023)     No current facility-administered medications on file prior to visit.    There are no Patient Instructions on file for this visit. No follow-ups on file.   Tanieka Pownall E Adger Cantera, NP

## 2024-01-01 NOTE — Progress Notes (Signed)
 Subjective:    Patient ID: Scott [name], male    DOB: [birth year], 53 y.o.   MRN: [id number] Chief Complaint  Patient presents with   New Patient (Initial Visit)    np. LE Reflux + Consult Pain and swelling of right lower leg. Scott [name]     Patient is seen for evaluation of leg swelling. The patient first noticed the swelling remotely but is now concerned because of a significant increase in the overall edema. The swelling isn't associated with significant pain.  There has been an increasing amount of  discoloration noted by the patient. The patient notes that in the morning the legs are improved but they steadily worsened throughout the course of the day. Elevation seems to make the swelling of the legs better, dependency makes them much worse.   There is no history of ulcerations associated with the swelling.   The patient denies any recent changes in their medications.  The patient has been wearing graduated compression.  The patient has no had any past angiography, interventions or vascular surgery.  The patient denies a history of DVT or PE. There is no prior history of phlebitis. There is no history of primary lymphedema.  There is no history of radiation treatment to the groin or pelvis No history of malignancies. No history of trauma or groin or pelvic surgery. No history of foreign travel or parasitic infections area   No evidence of DVT or superficial thrombophlebitis bilaterally.  No evidence of deep venous insufficiency or superficial thrombophlebitis bilaterally.     Review of Systems  Cardiovascular:  Positive for leg swelling.  All other systems reviewed and are negative.      Objective:   Physical Exam Vitals reviewed.  HENT:     Head: Normocephalic.  Cardiovascular:     Rate and Rhythm: Normal rate.  Pulmonary:     Effort: Pulmonary effort is normal.  Musculoskeletal:     Right lower leg: 2+ Edema present.     Left lower leg: 2+ Edema present.   Skin:    General: Skin is warm and dry.  Neurological:     Mental Status: He is alert and oriented to person, place, and time.     Motor: Weakness present.  Psychiatric:        Mood and Affect: Mood normal.        Behavior: Behavior normal.        Thought Content: Thought content normal.        Judgment: Judgment normal.     BP 119/75   Pulse 80   Ht 5' 2 (1.575 m)   Wt 195 lb (88.5 kg)   BMI 35.67 kg/m   Past Medical History:  Diagnosis Date   Acid reflux    Acute renal failure superimposed on stage 2 chronic kidney disease 12/30/2019   Anxiety    Arthritis    Asthma    Chronic heart failure with preserved ejection fraction (HFpEF) (HCC)    a. 02/2020 Echo: EF 60-65%, no rwma, nl RV fxn.   Contact dermatitis due to Genus Toxicodendron 09/11/2014   COPD (chronic obstructive pulmonary disease) (HCC)    Depression    Dysfunction of eustachian tube 09/11/2014   Enteritis presumed infectious 06/11/2008   Fall from slipping on ice 05/19/2020   Glaucoma    Groin strain 09/11/2014   History of stress test    a. 02/2018 MV: No ischemia/infarct. EF 35% (GI uptake - >55% by echo).  Low  risk study.   Hyperlipidemia    Hypertension    Klippel-Feil syndrome     Social History   Socioeconomic History   Marital status: Significant Other    Spouse name: Not on file   Number of children: 0   Years of education: Not on file   Highest education level: Bachelor's degree (e.g., BA, AB, BS)  Occupational History   Occupation: disability  Tobacco Use   Smoking status: Former    Current packs/day: 0.00    Average packs/day: 1 pack/day for 10.0 years (10.0 ttl pk-yrs)    Types: Cigarettes    Start date: 05/1996    Quit date: 05/2006    Years since quitting: 17.6   Smokeless tobacco: Never   Tobacco comments:    quit 05/2006  Vaping Use   Vaping status: Former   Devices: tried once  Substance and Sexual Activity   Alcohol  use: No    Alcohol /week: 0.0 standard drinks of  alcohol    Drug use: No   Sexual activity: Not on file  Other Topics Concern   Not on file  Social History Narrative   Not on file   Social Drivers of Health   Financial Resource Strain: Low Risk  (06/29/2023)   Overall Financial Resource Strain (CARDIA)    Difficulty of Paying Living Expenses: Not hard at all  Recent Concern: Financial Resource Strain - High Risk (05/02/2023)   Overall Financial Resource Strain (CARDIA)    Difficulty of Paying Living Expenses: Very hard  Food Insecurity: No Food Insecurity (06/29/2023)   Hunger Vital Sign    Worried About Running Out of Food in the Last Year: Never true    Ran Out of Food in the Last Year: Never true  Recent Concern: Food Insecurity - Food Insecurity Present (05/02/2023)   Hunger Vital Sign    Worried About Running Out of Food in the Last Year: Often true    Ran Out of Food in the Last Year: Sometimes true  Transportation Needs: No Transportation Needs (06/29/2023)   PRAPARE - Administrator, Civil Service (Medical): No    Lack of Transportation (Non-Medical): No  Physical Activity: Inactive (06/29/2023)   Exercise Vital Sign    Days of Exercise per Week: 0 days    Minutes of Exercise per Session: 0 min  Stress: Stress Concern Present (06/29/2023)   [employer] of Occupational Health - Occupational Stress Questionnaire    Feeling of Stress : To some extent  Social Connections: Moderately Isolated (06/29/2023)   Social Connection and Isolation Panel    Frequency of Communication with Friends and Family: More than three times a week    Frequency of Social Gatherings with Friends and Family: Once a week    Attends Religious Services: Never    Database administrator or Organizations: No    Attends Banker Meetings: Never    Marital Status: Living with partner  Intimate Partner Violence: Not At Risk (06/29/2023)   Humiliation, Afraid, Rape, and Kick questionnaire    Fear of Current or Ex-Partner: No     Emotionally Abused: No    Physically Abused: No    Sexually Abused: No    Past Surgical History:  Procedure Laterality Date   COLOSTOMY     DENVER SHUNT PLACEMENT      Family History  Problem Relation Age of Onset   Multiple sclerosis Mother    Colon cancer Father    Heart disease Father  Diabetes Sister    Lung cancer Maternal Grandmother    Thyroid  cancer Maternal Grandfather    Ovarian cancer Paternal Grandmother    Cancer Paternal Grandfather     Allergies  Allergen Reactions   Codeine Shortness Of Breath       Latest Ref Rng & Units 09/16/2023    9:00 AM 05/24/2023    2:54 PM 09/02/2022    2:02 PM  CBC  WBC 3.4 - 10.8 x10E3/uL 9.1  10.6  10.6   Hemoglobin 13.0 - 17.7 g/dL 86.4  85.1  85.4   Hematocrit 37.5 - 51.0 % 42.8  45.7  44.7   Platelets 150 - 450 x10E3/uL 316  360  350       CMP     Component Value Date/Time   NA 141 09/16/2023 0900   K 4.6 09/16/2023 0900   CL 95 (L) 09/16/2023 0900   CO2 27 09/16/2023 0900   GLUCOSE 187 (H) 09/16/2023 0900   GLUCOSE 145 (H) 07/01/2020 1318   BUN 23 09/16/2023 0900   CREATININE 0.97 09/16/2023 0900   CALCIUM  8.8 09/16/2023 0900   PROT 6.4 09/16/2023 0900   ALBUMIN 3.8 09/16/2023 0900   AST 13 09/16/2023 0900   ALT 11 09/16/2023 0900   ALKPHOS 153 (H) 09/16/2023 0900   BILITOT <0.2 09/16/2023 0900   EGFR 94 09/16/2023 0900   GFRNONAA >60 07/01/2020 1318     No results found.     Assessment & Plan:   1. Lymphedema (Primary) Recommend:  No surgery or intervention at this point in time.   The Patient is CEAP [id number].  The patient has been wearing compression for more than 12 weeks with no or little benefit.  The patient has been exercising daily for more than 12 weeks. The patient has been elevating and taking OTC pain medications for more than 12 weeks.  None of these have have eliminated the pain related to the lymphedema or the discomfort regarding excessive swelling and venous congestion.     I have reviewed my discussion with the patient regarding lymphedema and why it  causes symptoms.  Patient will continue wearing graduated compression on a daily basis. The patient should put the compression on first thing in the morning and removing them in the evening. The patient should not sleep in the compression.   In addition, behavioral modification throughout the day will be continued.  This will include frequent elevation (such as in a recliner), use of over the counter pain medications as needed and exercise such as walking.  The systemic causes for chronic edema such as liver, kidney and cardiac etiologies do not appear to have significant changed over the past year.    The patient has chronic , severe lymphedema with hyperpigmentation of the skin and has done MLD, skin care, medication, diet, exercise, elevation and compression for 4 weeks with no improvement,  I am recommending a lymphedema pump.  The patient still has stage 3 lymphedema and therefore, I believe that a lymph pump is needed to improve the control of the patient's lymphedema and improve the quality of life.  Additionally, a lymph pump is warranted because it will reduce the risk of cellulitis and ulceration in the future.  Patient should follow-up in six months   2. Diabetic polyneuropathy associated with type 2 diabetes mellitus (HCC) The patient has neuropathy which is difficult for him.  He has previously seen a neurologist but has been sometime.  He does request referral For  neurology for further evaluation regarding options.  3. Chronic diastolic heart failure (HCC) This may also play a component in his swelling but at this time he does note that is under good control 4. Primary hypertension Continue antihypertensive medications as already ordered, these medications have been reviewed and there are no changes at this time.   Current Outpatient Medications on File Prior to Visit  Medication Sig Dispense Refill    albuterol  (VENTOLIN  HFA) 108 (90 Base) MCG/ACT inhaler Inhale 2 puffs into the lungs every 4 (four) hours as needed for wheezing or shortness of breath. 1 each 1   blood glucose meter kit and supplies Use 1-2 times a day as needed.Dispense based on patient and insurance preference. (FOR ICD-10 E10.9, E11.9). 1 each 0   cabergoline (DOSTINEX) 0.5 MG tablet Take by mouth.     FARXIGA  10 MG TABS tablet TAKE 1 TABLET(10 MG) BY MOUTH DAILY 90 tablet 3   gabapentin  (NEURONTIN ) 300 MG capsule Take 1 capsule (300 mg total) by mouth 2 (two) times daily. 60 capsule 3   glucose blood (ONETOUCH VERIO) test strip USE TO CHECK BLOOD SUGAR EVERY DAY 100 strip 0   Lancets (ONETOUCH DELICA PLUS LANCET30G) MISC 1 each by Does not apply route daily. 100 each 0   latanoprost  (XALATAN ) 0.005 % ophthalmic solution Place 1 drop into both eyes at bedtime.     PARoxetine  (PAXIL ) 40 MG tablet Take 1 tablet (40 mg total) by mouth daily. 90 tablet 0   Plecanatide  (TRULANCE ) 3 MG TABS Take 1 tablet (3 mg total) by mouth daily. 30 tablet 5   spironolactone  (ALDACTONE ) 25 MG tablet Take 1 tablet (25 mg total) by mouth 2 (two) times daily. 180 tablet 1   tirzepatide  (MOUNJARO ) 2.5 MG/0.5ML Pen Inject 2.5 mg into the skin once a week. 2 mL 0   torsemide  (DEMADEX ) 20 MG tablet TAKE 3 TABLETS(60 MG) BY MOUTH DAILY 270 tablet 0   ANDROGEL  PUMP 20.25 MG/ACT (1.62%) GEL Apply 1 application  topically daily. (Patient not taking: Reported on 12/21/2023)     bisoprolol  (ZEBETA ) 5 MG tablet TAKE 1/2 TABLET(2.5 MG) BY MOUTH DAILY 45 tablet 3   budesonide -formoterol  (SYMBICORT ) 80-4.5 MCG/ACT inhaler Inhale 2 puffs into the lungs 2 (two) times daily. (Patient not taking: Reported on 12/21/2023) 10.2 g 12   hydrochlorothiazide  (HYDRODIURIL ) 12.5 MG tablet Take 12.5 mg by mouth every morning. (Patient not taking: Reported on 12/21/2023)     No current facility-administered medications on file prior to visit.    There are no Patient  Instructions on file for this visit. No follow-ups on file.   Carlosdaniel Grob E Ernestene Coover, NP

## 2024-01-14 ENCOUNTER — Other Ambulatory Visit: Payer: Self-pay | Admitting: Family Medicine

## 2024-01-14 DIAGNOSIS — F418 Other specified anxiety disorders: Secondary | ICD-10-CM

## 2024-01-16 ENCOUNTER — Other Ambulatory Visit: Payer: Self-pay | Admitting: Family Medicine

## 2024-01-16 DIAGNOSIS — F418 Other specified anxiety disorders: Secondary | ICD-10-CM

## 2024-01-16 NOTE — Telephone Encounter (Unsigned)
 Copied from CRM [id number]. Topic: Clinical - Medication Refill >> Jan 16, 2024  4:53 PM Jasmin G wrote: Medication: PARoxetine  (PAXIL ) 40 MG tablet  Has the patient contacted their pharmacy? No (Agent: If no, request that the patient contact the pharmacy for the refill. If patient does not wish to contact the pharmacy document the reason why and proceed with request.) (Agent: If yes, when and what did the pharmacy advise?)  This is the patient's preferred pharmacy:  [id number] [place], [place] - [address] AT [facility] OF [address] [place] [address] [place] [address] Phone: [phone] Fax: [phone]  Is this the correct pharmacy for this prescription? Yes If no, delete pharmacy and type the correct one.   Has the prescription been filled recently? Yes  Is the patient out of the medication? No. 2 pills left.  Has the patient been seen for an appointment in the last year OR does the patient have an upcoming appointment? Yes  Can we respond through MyChart? Yes  Agent: Please be advised that Rx refills may take up to 3 business days. We ask that you follow-up with your pharmacy.

## 2024-03-05 ENCOUNTER — Other Ambulatory Visit: Payer: Self-pay | Admitting: Cardiovascular Disease

## 2024-03-05 DIAGNOSIS — R609 Edema, unspecified: Secondary | ICD-10-CM

## 2024-03-06 ENCOUNTER — Ambulatory Visit: Payer: Self-pay

## 2024-03-06 NOTE — Telephone Encounter (Signed)
 Patient call note and symptoms reviewed. Agree with scheduled appt. Will evaluate during OV

## 2024-03-06 NOTE — Telephone Encounter (Signed)
 FYI Only or Action Required?: FYI only for provider: appointment scheduled on 03/07/24.  Patient was last seen in primary care on 10/21/2023 by Scott Nancyann BRAVO, MD.  Called Nurse Triage reporting Bleeding/Bruising.  Symptoms began today.  Interventions attempted: Other: pressure.  Symptoms are: stable.  Triage Disposition: Home Care  Patient/caregiver understands and will follow disposition?: Yes   Copied from CRM #[id number]. Topic: Clinical - Red Word Triage >> Mar 06, 2024 12:25 PM [name] wrote: Red Word that prompted transfer to Nurse Triage: heavy nosebleeds worried about his heart failure condition Reason for Disposition  [1] Mild-moderate nosebleed AND [2] bleeding stopped now  Answer Assessment - Initial Assessment Questions Pt's wife called in requesting appt for today d/t nosebleed. Pt stated it was his first one in over a year and he was able to get bleeding to stop. Denied any chest pain, SOB or dizziness. Wife states they have limited resources and are not able to use a humidifier. She voiced high anxiety regarding pt's bleeding d/t heart failure. Pt denied any symptoms. Home care discussed and appointment scheduled for evaluation. Patient agrees with plan of care, and will call back if anything changes, or if symptoms worsen.      1. AMOUNT OF BLEEDING: How bad is the bleeding? How much blood was lost? Has the bleeding stopped?     Minimal per pt; he reports he has not had a nose bleed in over a year. Pt's wife worried d/t heart failure and loss of blood   2. ONSET: When did the nosebleed start?      This morning; was able to stop with tissues   3. FREQUENCY: How many nosebleeds have you had in the last 24 hours?      1  4. RECURRENT SYMPTOMS: Have there been other recent nosebleeds? If Yes, ask: How long did it take you to stop the bleeding? What worked best?      First nosebleed in a year  5. CAUSE: What do you think caused this nosebleed?      Pt has oxygen  and house is colder and dry. They report they have limited resources and their house stays cold. Discussed that in the dry weather, he is prone to more nosebleeds.   6. LOCAL FACTORS: Do you have any cold symptoms?, Have you been rubbing or picking at your nose?     None   7. SYSTEMIC FACTORS: Do you have high blood pressure or any bleeding problems?     Heart failure   8. OTHER SYMPTOMS: Do you have any other symptoms? (e.g., lightheadedness)     none  Protocols used: Nosebleed-A-AH

## 2024-03-07 ENCOUNTER — Ambulatory Visit: Admitting: Family Medicine

## 2024-03-14 ENCOUNTER — Encounter: Payer: Self-pay | Admitting: Family Medicine

## 2024-03-14 ENCOUNTER — Ambulatory Visit: Admitting: Family Medicine

## 2024-03-14 VITALS — BP 121/72 | HR 92 | Resp 15 | Wt 190.0 lb

## 2024-03-14 DIAGNOSIS — Q761 Klippel-Feil syndrome: Secondary | ICD-10-CM

## 2024-03-14 DIAGNOSIS — I5043 Acute on chronic combined systolic (congestive) and diastolic (congestive) heart failure: Secondary | ICD-10-CM

## 2024-03-14 DIAGNOSIS — E1142 Type 2 diabetes mellitus with diabetic polyneuropathy: Secondary | ICD-10-CM

## 2024-03-14 DIAGNOSIS — I890 Lymphedema, not elsewhere classified: Secondary | ICD-10-CM | POA: Insufficient documentation

## 2024-03-14 DIAGNOSIS — L03115 Cellulitis of right lower limb: Secondary | ICD-10-CM

## 2024-03-14 DIAGNOSIS — Q039 Congenital hydrocephalus, unspecified: Secondary | ICD-10-CM

## 2024-03-14 MED ORDER — AMOXICILLIN-POT CLAVULANATE 875-125 MG PO TABS: 1.0000 | ORAL_TABLET | Freq: Two times a day (BID) | ORAL | 0 refills | Status: AC

## 2024-03-14 MED ORDER — TIRZEPATIDE 2.5 MG/0.5ML ~~LOC~~ SOAJ: 2.5000 mg | SUBCUTANEOUS | 0 refills | Status: AC

## 2024-03-14 NOTE — Progress Notes (Signed)
 Established patient visit   Patient: Scott [name]   DOB: 07-04-1970   53 y.o. Male  MRN: [id number] Visit Date: 03/14/2024  Today's healthcare provider: Nancyann Perry, MD   Chief Complaint  Patient presents with   Edema   Subjective    Discussed the use of AI scribe software for clinical note transcription with the patient, who gave verbal consent to proceed.  History of Present Illness   Scott [name] is a 53 year old male with lymphedema who presents with worsening redness and pain in his legs.  Over the past two to three days, he has experienced worsening redness and extreme pain in his legs, particularly affecting his foot and toes. He describes a sensation of heat in his leg. No systemic fever, chills, or sweats. There is a spot on his leg that keeps opening.  He has a history of lymphedema, diagnosed after thorough evaluation, and has been on several antibiotics in the past. Augmentin  was effective in reducing redness and swelling, though it did not alleviate pain. He recalls a specific antibiotic starting with 'A' prescribed in 2022 that helped initially.  He has made significant dietary changes to manage his condition, including eliminating red meat, caffeine, sugar, and bread, and reducing salt intake. He monitors his blood sugar at home and has noticed fluctuations potentially linked to his medication use, specifically bisoprolol . His recent A1c was 8.8, slightly improved from previous readings.  He is currently taking Farxiga  for blood sugar management and was previously recommended Mounjaro  but has not started it. He is not currently seeing an endocrinologist as his previous one retired, and he has not been taking AndroGel  since then.       Medications: Outpatient Medications Prior to Visit  Medication Sig   bisoprolol  (ZEBETA ) 5 MG tablet TAKE 1/2 TABLET(2.5 MG) BY MOUTH DAILY   FARXIGA  10 MG TABS tablet TAKE 1 TABLET(10 MG) BY MOUTH DAILY   potassium chloride  SA  (KLOR-CON  M) 20 MEQ tablet TAKE 2 TABLETS(40 MEQ) BY MOUTH TWICE DAILY   spironolactone  (ALDACTONE ) 25 MG tablet Take 1 tablet (25 mg total) by mouth 2 (two) times daily.   torsemide  (DEMADEX ) 20 MG tablet TAKE 3 TABLETS(60 MG) BY MOUTH DAILY   albuterol  (VENTOLIN  HFA) 108 (90 Base) MCG/ACT inhaler Inhale 2 puffs into the lungs every 4 (four) hours as needed for wheezing or shortness of breath. (Patient not taking: Reported on 03/14/2024)   ANDROGEL  PUMP 20.25 MG/ACT (1.62%) GEL Apply 1 application  topically daily. (Patient not taking: Reported on 03/14/2024)   blood glucose meter kit and supplies Use 1-2 times a day as needed.Dispense based on patient and insurance preference. (FOR ICD-10 E10.9, E11.9). (Patient not taking: Reported on 03/14/2024)   budesonide -formoterol  (SYMBICORT ) 80-4.5 MCG/ACT inhaler Inhale 2 puffs into the lungs 2 (two) times daily. (Patient not taking: Reported on 03/14/2024)   cabergoline (DOSTINEX) 0.5 MG tablet Take by mouth. (Patient not taking: Reported on 03/14/2024)   gabapentin  (NEURONTIN ) 300 MG capsule Take 1 capsule (300 mg total) by mouth 2 (two) times daily. (Patient not taking: Reported on 03/14/2024)   glucose blood (ONETOUCH VERIO) test strip USE TO CHECK BLOOD SUGAR EVERY DAY (Patient not taking: Reported on 03/14/2024)   hydrochlorothiazide  (HYDRODIURIL ) 12.5 MG tablet Take 12.5 mg by mouth every morning. (Patient not taking: Reported on 12/21/2023)   Lancets (ONETOUCH DELICA PLUS LANCET30G) MISC 1 each by Does not apply route daily. (Patient not taking: Reported on 03/14/2024)  latanoprost  (XALATAN ) 0.005 % ophthalmic solution Place 1 drop into both eyes at bedtime. (Patient not taking: Reported on 03/14/2024)   PARoxetine  (PAXIL ) 40 MG tablet TAKE 1 TABLET(40 MG) BY MOUTH DAILY (Patient not taking: Reported on 03/14/2024)   Plecanatide  (TRULANCE ) 3 MG TABS Take 1 tablet (3 mg total) by mouth daily. (Patient not taking: Reported on 03/14/2024)    [DISCONTINUED] tirzepatide  (MOUNJARO ) 2.5 MG/0.5ML Pen Inject 2.5 mg into the skin once a week. (Patient not taking: Reported on 03/14/2024)   No facility-administered medications prior to visit.   Review of Systems  Constitutional:  Negative for appetite change, chills and fever.  Respiratory:  Negative for chest tightness, shortness of breath and wheezing.   Cardiovascular:  Negative for chest pain and palpitations.  Gastrointestinal:  Negative for abdominal pain, nausea and vomiting.       Objective    BP 121/72 (BP Location: Left Arm, Patient Position: Sitting, Cuff Size: Normal)   Pulse 92   Resp 15   Wt 190 lb (86.2 kg)   SpO2 95% Comment: patient not  on his portable oxygen   BMI 34.75 kg/m   Physical Exam   General: Appearance:    Obese male in no acute distress  Eyes:    PERRL, conjunctiva/corneas clear, EOM's intact       Lungs:     Clear to auscultation bilaterally, respirations unlabored  Heart:    Normal heart rate. Normal rhythm. No murmurs, rubs, or gallops.    MS:   All extremities are intact.    Neurologic:   Awake, alert, oriented x 3. No apparent focal neurological defect.           Assessment & Plan        Cellulitis of right lower extremity Recurrent cellulitis with worsening symptoms and persistent open spot. Augmentin  effective for reducing redness and swelling. - Prescribed Augmentin . - Apply antibiotic ointment to open spot. - Elevate leg to reduce swelling. - Consider extra dose of torsemide  for swelling.  Lymphedema of lower extremity Chronic lymphedema causing swelling and skin changes. Circulation issues noted.  - Continue torsemide  and spironolactone . May take an extra dose of torsemide  today and tomorrow.  - Elevate legs.  Type 2 diabetes mellitus with polyneuropathy Elevated blood sugar with A1c of 8.8.  - Prescribed Mounjaro , start 2.5 mg weekly, increase to 5 mg if tolerated. - Referred to endocrinologist in [place].  - Continue  gabapentin  for neuropathy.     Klippel-Feil syndrome Previous endocrinologist has retired and is not able to go to [facility] endocrinology.  - Ambulatory referral to Endocrinology  Congenital hydrocephalus (HCC) Well compensated  Acute on chronic combined systolic and diastolic congestive heart failure (HCC) Fairly well compensated on current medications but he is concerned about effect of bisoprolol  on his blood sugar. He is to follow up with prescribing cardiologist for this.   Other orders - tirzepatide  (MOUNJARO ) 2.5 MG/0.5ML Pen; Inject 2.5 mg into the skin once a week.  Dispense: 2 mL; Refill: 0      Nancyann Perry, MD  [facility] [phone] (phone) [phone] (fax)  [facility]

## 2024-03-14 NOTE — Patient Instructions (Signed)
 [name]  Please review the attached list of medications and notify my office if there are any errors.   . Please bring all of your medications to every appointment so we can make sure that our medication list is the same as yours.

## 2024-03-20 ENCOUNTER — Ambulatory Visit: Payer: Self-pay

## 2024-03-20 NOTE — Telephone Encounter (Signed)
 FYI Only or Action Required?: FYI only for provider: appointment scheduled on 12/19 at pt request, refused UC.  Patient was last seen in primary care on 03/14/2024 by Gasper Nancyann BRAVO, MD.  Called Nurse Triage reporting Leg Pain.  Symptoms began a week ago.  Interventions attempted: Prescription medications: abx.  Symptoms are: gradually worsening.  Triage Disposition: See HCP Within 4 Hours (Or PCP Triage)  Patient/caregiver understands and will follow disposition?: No, wishes to speak with PCP  Copied from CRM #[id number]. Topic: Clinical - Red Word Triage >> Mar 20, 2024  4:08 PM Scott [name] wrote: Red Word that prompted transfer to Nurse Triage: has infection in right leg that is being treated with antibiotics but seems worse and pain level is 10 Reason for Disposition  [1] SEVERE pain (e.g., excruciating, unable to do any normal activities) AND [2] not improved after 2 hours of pain medicine  Answer Assessment - Initial Assessment Questions 1. ONSET: When did the pain start?      Ongoing, was seen by PCP and given abx for infection in leg, pt states that leg looks worse and is more painful 2. LOCATION: Where is the pain located?      R leg 3. PAIN: How bad is the pain?    (Scale 1-10; or mild, moderate, severe)     10 4. WORK OR EXERCISE: Has there been any recent work or exercise that involved this part of the body?      Denies injury 5. CAUSE: What do you think is causing the leg pain?     Was told it was an infection 6. OTHER SYMPTOMS: Do you have any other symptoms? (e.g., chest pain, back pain, breathing difficulty, swelling, rash, fever, numbness, weakness)     Redness-has increased, states there is also purple starting, states it is front lower leg, denies drainage, denies fever, States leg feels warm to the touch  Pt states that he started the abx on 12/10 and has been taking as prescribed. Pt advised to go to UC, pt declines, states that he wants to see PCP.  Pt states that he does not believe he has an infection. Pt confirms that there is redness to lower leg, RN advised pt that this could be sign of infection and UC can evaluate him today. Pt declines. Pt states noone has really diagnosed my leg with anything but lymphedema, and they tell me the things to do but that doesn't help. Pt again requests appt with PCP, advised that PCP does not have appt until 12/19, pt states that he understands my advice but he does not want to go to UC.  Protocols used: Leg Pain-A-AH

## 2024-03-23 ENCOUNTER — Ambulatory Visit: Admitting: Family Medicine

## 2024-03-23 ENCOUNTER — Ambulatory Visit: Admitting: Cardiovascular Disease

## 2024-03-23 VITALS — BP 112/68 | HR 82 | Temp 97.7°F | Ht 62.0 in | Wt 191.9 lb

## 2024-03-23 DIAGNOSIS — M79661 Pain in right lower leg: Secondary | ICD-10-CM

## 2024-03-23 DIAGNOSIS — L03115 Cellulitis of right lower limb: Secondary | ICD-10-CM | POA: Diagnosis not present

## 2024-03-23 DIAGNOSIS — M7989 Other specified soft tissue disorders: Secondary | ICD-10-CM

## 2024-03-23 MED ORDER — CEFDINIR 300 MG PO CAPS: 600.0000 mg | ORAL_CAPSULE | Freq: Every day | ORAL | 0 refills | Status: AC

## 2024-03-23 NOTE — Patient Instructions (Signed)
 Please review the attached list of medications and notify my office if there are any errors.   Please bring all of your medications to every appointment so we can make sure that our medication list is the same as yours.   You can contact [facility] at [phone] to try to schedule a sooner follow appt

## 2024-03-23 NOTE — Progress Notes (Signed)
 "     Established patient visit   Patient: Scott [name]   DOB: 05-Dec-1970   53 y.o. Male  MRN: [id number] Visit Date: 03/23/2024  Today's healthcare provider: Nancyann Perry, MD   Chief Complaint  Patient presents with   Edema    Patient presents with swelling in both lower legs and feet, redness in the area as well, onset x over the past year. Will swell off and on. Patient keeps it under control with diet and cutting out foods that flare lymphedema, cut out caffiene. Both legs and feel are painful to walk on    Subjective    Discussed the use of AI scribe software for clinical note transcription with the patient, who gave verbal consent to proceed.  History of Present Illness   Scott [name] is a 53 year old male who presents for follow up persistent foot pain and redness originally seen 10 days ago and prescribed augmentin .   He experiences persistent foot pain in the lower leg down before the toes, described as a stinging sensation similar to warming up after being cold. The pain has not improved since starting antibiotics last week. He notes a darker spot on the skin with peeling, similar to previous episodes when he had blisters.  He is on the last day of Augmentin , which has not significantly improved his symptoms. He has previously been treated with cefdinir  and cephalexin  for similar issues. He has been keeping his foot elevated, and the warmth previously noted has decreased, although the redness persists.  He suspects an environmental factor in his home may be contributing to his symptoms, as he resolved when he stayed at his sister's house for a week and a half. Upon returning home, the symptoms reappeared within two days.  He recalls being referred to a vascular specialist in [place], possibly seen in September, but is unsure of the details. He is concerned about the progression of his condition. No open sores on the foot. The foot is not hot to the touch, unlike previous  episodes.       Medications: Show/hide medication list[1] Review of Systems     Objective    BP 112/68 (BP Location: Left Arm, Patient Position: Sitting, Cuff Size: Normal)   Pulse 82   Temp 97.7 F (36.5 C) (Oral)   Ht 5' 2 (1.575 m)   Wt 191 lb 14.4 oz (87 kg)   SpO2 99%   BMI 35.10 kg/m   Physical Exam     Assessment & Plan       1. Cellulitis of right lower extremity (Primary) On last day of 10 day course of Augmentin . Is no longer warm to touch but still erythematous. May be related to chronic edema. Change antibiotic to cefdinir  600mg  daily for 10 day.   2. Pain and swelling of right lower leg Multifactorial. Previously evaluated at [facility] and recommend he return for early follow up.       Nancyann Perry, MD  [facility] [phone] (phone) [phone] (fax)  [facility]    [1]  Outpatient Medications Prior to Visit  Medication Sig   albuterol  (VENTOLIN  HFA) 108 (90 Base) MCG/ACT inhaler Inhale 2 puffs into the lungs every 4 (four) hours as needed for wheezing or shortness of breath.   amoxicillin -clavulanate (AUGMENTIN ) 875-125 MG tablet Take 1 tablet by mouth 2 (two) times daily for 10 days.   ANDROGEL  PUMP 20.25 MG/ACT (1.62%) GEL Apply 1 application  topically  daily.   bisoprolol  (ZEBETA ) 5 MG tablet TAKE 1/2 TABLET(2.5 MG) BY MOUTH DAILY   blood glucose meter kit and supplies Use 1-2 times a day as needed.Dispense based on patient and insurance preference. (FOR ICD-10 E10.9, E11.9).   budesonide -formoterol  (SYMBICORT ) 80-4.5 MCG/ACT inhaler Inhale 2 puffs into the lungs 2 (two) times daily.   cabergoline (DOSTINEX) 0.5 MG tablet Take by mouth.   FARXIGA  10 MG TABS tablet TAKE 1 TABLET(10 MG) BY MOUTH DAILY   gabapentin  (NEURONTIN ) 300 MG capsule Take 1 capsule (300 mg total) by mouth 2 (two) times daily.   glucose blood (ONETOUCH VERIO) test strip USE TO CHECK BLOOD SUGAR EVERY DAY   Lancets (ONETOUCH DELICA  PLUS LANCET30G) MISC 1 each by Does not apply route daily.   latanoprost  (XALATAN ) 0.005 % ophthalmic solution Place 1 drop into both eyes at bedtime.   PARoxetine  (PAXIL ) 40 MG tablet TAKE 1 TABLET(40 MG) BY MOUTH DAILY   potassium chloride  SA (KLOR-CON  M) 20 MEQ tablet TAKE 2 TABLETS(40 MEQ) BY MOUTH TWICE DAILY   spironolactone  (ALDACTONE ) 25 MG tablet Take 1 tablet (25 mg total) by mouth 2 (two) times daily.   tirzepatide  (MOUNJARO ) 2.5 MG/0.5ML Pen Inject 2.5 mg into the skin once a week.   torsemide  (DEMADEX ) 20 MG tablet TAKE 3 TABLETS(60 MG) BY MOUTH DAILY   hydrochlorothiazide  (HYDRODIURIL ) 12.5 MG tablet Take 12.5 mg by mouth every morning. (Patient not taking: Reported on 03/23/2024)   Plecanatide  (TRULANCE ) 3 MG TABS Take 1 tablet (3 mg total) by mouth daily. (Patient not taking: Reported on 03/23/2024)   No facility-administered medications prior to visit.   "

## 2024-03-30 ENCOUNTER — Other Ambulatory Visit: Payer: Self-pay | Admitting: Cardiology

## 2024-03-30 DIAGNOSIS — R609 Edema, unspecified: Secondary | ICD-10-CM

## 2024-04-12 ENCOUNTER — Other Ambulatory Visit: Payer: Self-pay | Admitting: Family Medicine

## 2024-04-12 DIAGNOSIS — E1142 Type 2 diabetes mellitus with diabetic polyneuropathy: Secondary | ICD-10-CM

## 2024-04-12 MED ORDER — TIRZEPATIDE 5 MG/0.5ML ~~LOC~~ SOAJ: 5.0000 mg | SUBCUTANEOUS | 0 refills | Status: AC

## 2024-04-20 ENCOUNTER — Other Ambulatory Visit: Payer: Self-pay | Admitting: Family Medicine

## 2024-04-20 DIAGNOSIS — I10 Essential (primary) hypertension: Secondary | ICD-10-CM

## 2024-05-01 ENCOUNTER — Ambulatory Visit: Admitting: Cardiovascular Disease

## 2024-05-06 NOTE — Progress Notes (Unsigned)
 Cardiology Office Note  Date:  05/06/2024   ID:  Scott [name], DOB 1971-02-01, MRN [id number]  PCP:  Gasper Nancyann BRAVO, MD   No chief complaint on file.   HPI:  Scott [name] is a 54 y.o. male with history of  congenital abnormality (Klippel-File syndrome) with restrictive lung disease and airway hyperactivity,  diastolic CHF,  Former smoker, quit 2008 hypertension,  hyperlipidemia,  asthma,  depression,  anxiety Who presents for routine follow-up of his chronic diastolic CHF, lung disease  LOV with myself 7/23   Presents today with caretaker In follow-up today he reports he has had slow improvement in his weight over the past several years On ozempic, Lost weight on 1 mg, dose increased up to 2 mg  Does not feel he is losing much weight on the higher dose of Ozempic  Does not eat out much, does not eat out very much One soda a day at most Lab work reviewed A1C >8, down to 7  Weight down 16 pounds over the past year  Back up 5 pounds  Reports some gait instability, several recent falls Works for [employer] (physical [employer]), reports having 2 falls  On 1 fall hit his head landed on the food  Still has some sensitivity on his forehead Did not seek medical attention at the time, happened over a month ago  No regular walking or exercise program No recent hospital admissions Reports compliance with torsemide  60 daily also on spironolactone , potassium Denies lower extremity edema, no PND orthopnea  EKG personally reviewed by myself on todays visit Normal sinus rhythm rate 89 bpm no significant ST-T wave changes  Other past medical history reviewed Admitted 12/19/2019-01/05/2020 for COPD exacerbation.   Bilateral ultrasounds were negative for DVT.   D-dimer was normal.    Echocardiogram 02/20/2020 with normal LVEF, normal wall motion, normal LV diastolic dysfunction, RV normal size and pressure, no significant valvular normality.  Right atrial pressure mildly elevated at 8  mmHg.  Hospitalization November 2019, acute on chronic diastolic CHF with hypoxia requiring IV Lasix    Echo on 02/05/2018 showed an LV EF: 55% -   60%.  Stress test on 02/09/2018 showed no evidence of ischemia..  Sleep study on 03/10/2018, was negative for OSA. Pulmonary function testing showed severe restrictive lung disease.   PMH:   has a past medical history of Acid reflux, Acute renal failure superimposed on stage 2 chronic kidney disease (12/30/2019), Anxiety, Arthritis, Asthma, Chronic heart failure with preserved ejection fraction (HFpEF) (HCC), Contact dermatitis due to Genus Toxicodendron (09/11/2014), COPD (chronic obstructive pulmonary disease) (HCC), Depression, Dysfunction of eustachian tube (09/11/2014), Enteritis presumed infectious (06/11/2008), Fall from slipping on ice (05/19/2020), Glaucoma, Groin strain (09/11/2014), History of stress test, Hyperlipidemia, Hypertension, and Klippel-Feil syndrome.  PSH:    Past Surgical History:  Procedure Laterality Date   COLOSTOMY     DENVER SHUNT PLACEMENT      Current Outpatient Medications  Medication Sig Dispense Refill   albuterol  (VENTOLIN  HFA) 108 (90 Base) MCG/ACT inhaler Inhale 2 puffs into the lungs every 4 (four) hours as needed for wheezing or shortness of breath. 1 each 1   ANDROGEL  PUMP 20.25 MG/ACT (1.62%) GEL Apply 1 application  topically daily.     bisoprolol  (ZEBETA ) 5 MG tablet TAKE 1/2 TABLET(2.5 MG) BY MOUTH DAILY 45 tablet 3   blood glucose meter kit and supplies Use 1-2 times a day as needed.Dispense based on patient and insurance preference. (FOR ICD-10 E10.9, E11.9). 1 each 0  budesonide -formoterol  (SYMBICORT ) 80-4.5 MCG/ACT inhaler Inhale 2 puffs into the lungs 2 (two) times daily. 10.2 g 12   cabergoline (DOSTINEX) 0.5 MG tablet Take by mouth.     cefdinir  (OMNICEF ) 300 MG capsule Take 2 capsules (600 mg total) by mouth daily. 10 capsule 0   FARXIGA  10 MG TABS tablet TAKE 1 TABLET(10 MG) BY MOUTH DAILY 90  tablet 3   gabapentin  (NEURONTIN ) 300 MG capsule Take 1 capsule (300 mg total) by mouth 2 (two) times daily. 60 capsule 3   glucose blood (ONETOUCH VERIO) test strip USE TO CHECK BLOOD SUGAR EVERY DAY 100 strip 0   hydrochlorothiazide  (HYDRODIURIL ) 12.5 MG tablet Take 12.5 mg by mouth every morning. (Patient not taking: Reported on 03/23/2024)     Lancets (ONETOUCH DELICA PLUS LANCET30G) MISC 1 each by Does not apply route daily. 100 each 0   latanoprost  (XALATAN ) 0.005 % ophthalmic solution Place 1 drop into both eyes at bedtime.     PARoxetine  (PAXIL ) 40 MG tablet TAKE 1 TABLET(40 MG) BY MOUTH DAILY 90 tablet 4   Plecanatide  (TRULANCE ) 3 MG TABS Take 1 tablet (3 mg total) by mouth daily. (Patient not taking: Reported on 03/23/2024) 30 tablet 5   potassium chloride  SA (KLOR-CON  M) 20 MEQ tablet TAKE 2 TABLETS(40 MEQ) BY MOUTH TWICE DAILY 120 tablet 5   spironolactone  (ALDACTONE ) 25 MG tablet TAKE 1 TABLET(25 MG) BY MOUTH TWICE DAILY 180 tablet 1   tirzepatide  (MOUNJARO ) 2.5 MG/0.5ML Pen Inject 2.5 mg into the skin once a week. 2 mL 0   tirzepatide  (MOUNJARO ) 5 MG/0.5ML Pen Inject 5 mg into the skin once a week. 6 mL 0   torsemide  (DEMADEX ) 20 MG tablet TAKE 3 TABLETS(60 MG) BY MOUTH DAILY 90 tablet 0   No current facility-administered medications for this visit.     Allergies:   Codeine   Social History:  The patient  reports that he quit smoking about 18 years ago. His smoking use included cigarettes. He started smoking about 28 years ago. He has a 10 pack-year smoking history. He has never used smokeless tobacco. He reports that he does not drink alcohol  and does not use drugs.   Family History:   family history includes Cancer in his paternal grandfather; Colon cancer in his father; Diabetes in his sister; Heart disease in his father; Lung cancer in his maternal grandmother; Multiple sclerosis in his mother; Ovarian cancer in his paternal grandmother; Thyroid  cancer in his maternal  grandfather.    Review of Systems: Review of Systems  Constitutional: Negative.   HENT: Negative.    Respiratory: Negative.    Cardiovascular: Negative.   Gastrointestinal: Negative.   Musculoskeletal:  Positive for falls.  Neurological: Negative.   Psychiatric/Behavioral: Negative.    All other systems reviewed and are negative.   PHYSICAL EXAM: VS:  There were no vitals taken for this visit. , BMI There is no height or weight on file to calculate BMI. Constitutional:  oriented to person, place, and time. No distress.  Obese Presenting in a wheelchair, obese, on oxygen  HENT:  Head: Grossly normal Eyes:  no discharge. No scleral icterus.  Neck: No JVD, no carotid bruits  Cardiovascular: Regular rate and rhythm, no murmurs appreciated Pulmonary/Chest: Clear to auscultation bilaterally, no wheezes or rails Abdominal: Soft.  no distension.  no tenderness.  Musculoskeletal: Normal range of motion, disfigured hands/fingers Neurological:  normal muscle tone. Coordination normal. No atrophy Skin: Skin warm and dry Psychiatric: normal affect, pleasant  Recent Labs:  09/16/2023: ALT 11; BUN 23; Creatinine, Ser 0.97; Hemoglobin 13.5; Platelets 316; Potassium 4.6; Sodium 141    Lipid Panel Lab Results  Component Value Date   CHOL 181 09/16/2023   HDL 72 09/16/2023   LDLCALC 95 09/16/2023   TRIG 75 09/16/2023      Wt Readings from Last 3 Encounters:  03/23/24 191 lb 14.4 oz (87 kg)  03/14/24 190 lb (86.2 kg)  12/21/23 195 lb (88.5 kg)     ASSESSMENT AND PLAN:  Problem List Items Addressed This Visit   None  Acute on chronic respiratory distress Secondary to restrictive lung disease,  chronic diastolic CHF Encouragement provided for slow weight loss over the past 2 years on Ozempic Appears euvolemic, will continue torsemide  60 daily with spironolactone  and potassium  Chronic diastolic CHF Continue medications as above, appears euvolemic Low blood pressure likely in  the setting of weight loss, asymptomatic, will monitor for now  Obesity Slow weight loss on Ozempic Improved A1c  Falls Strategies discussed to minimize falls Recommend regular walking program    Total encounter time more than 30 minutes  Greater than 50% was spent in counseling and coordination of care with the patient    Signed, Velinda Lunger, M.D., Ph.D. [facility] [place], [place] [phone]

## 2024-05-07 ENCOUNTER — Ambulatory Visit: Admitting: Cardiovascular Disease

## 2024-05-07 DIAGNOSIS — E782 Mixed hyperlipidemia: Secondary | ICD-10-CM

## 2024-05-07 DIAGNOSIS — I10 Essential (primary) hypertension: Secondary | ICD-10-CM

## 2024-05-07 DIAGNOSIS — I5032 Chronic diastolic (congestive) heart failure: Secondary | ICD-10-CM

## 2024-05-11 ENCOUNTER — Other Ambulatory Visit: Payer: Self-pay | Admitting: Cardiology

## 2024-05-11 DIAGNOSIS — R609 Edema, unspecified: Secondary | ICD-10-CM

## 2024-05-29 ENCOUNTER — Ambulatory Visit: Admitting: Neurology

## 2024-06-19 ENCOUNTER — Ambulatory Visit (INDEPENDENT_AMBULATORY_CARE_PROVIDER_SITE_OTHER): Admitting: Nurse Practitioner

## 2024-07-04 ENCOUNTER — Ambulatory Visit

## 2024-07-10 ENCOUNTER — Ambulatory Visit: Admitting: Cardiovascular Disease

## 2024-08-29 ENCOUNTER — Ambulatory Visit: Admitting: Podiatry

## 2024-09-05 ENCOUNTER — Ambulatory Visit: Admitting: "Endocrinology
